# Patient Record
Sex: Female | Born: 1975 | Race: Black or African American | Hispanic: No | Smoking: Current some day smoker
Health system: Southern US, Community
[De-identification: ages and names within clinical notes are randomized; demographics above are authoritative.]

## PROBLEM LIST (undated history)

## (undated) ENCOUNTER — Emergency Department (HOSPITAL_COMMUNITY): Admission: EM | Payer: Medicaid Other | Source: Home / Self Care

## (undated) DIAGNOSIS — R569 Unspecified convulsions: Secondary | ICD-10-CM

## (undated) DIAGNOSIS — Z21 Asymptomatic human immunodeficiency virus [HIV] infection status: Secondary | ICD-10-CM

## (undated) DIAGNOSIS — F419 Anxiety disorder, unspecified: Secondary | ICD-10-CM

## (undated) DIAGNOSIS — G4730 Sleep apnea, unspecified: Secondary | ICD-10-CM

## (undated) DIAGNOSIS — F32A Depression, unspecified: Secondary | ICD-10-CM

## (undated) DIAGNOSIS — J189 Pneumonia, unspecified organism: Secondary | ICD-10-CM

## (undated) DIAGNOSIS — G8929 Other chronic pain: Secondary | ICD-10-CM

## (undated) DIAGNOSIS — R011 Cardiac murmur, unspecified: Secondary | ICD-10-CM

## (undated) DIAGNOSIS — K8020 Calculus of gallbladder without cholecystitis without obstruction: Secondary | ICD-10-CM

## (undated) DIAGNOSIS — B20 Human immunodeficiency virus [HIV] disease: Secondary | ICD-10-CM

## (undated) DIAGNOSIS — M5430 Sciatica, unspecified side: Secondary | ICD-10-CM

## (undated) DIAGNOSIS — J449 Chronic obstructive pulmonary disease, unspecified: Secondary | ICD-10-CM

## (undated) DIAGNOSIS — E119 Type 2 diabetes mellitus without complications: Secondary | ICD-10-CM

## (undated) DIAGNOSIS — M797 Fibromyalgia: Secondary | ICD-10-CM

## (undated) DIAGNOSIS — N809 Endometriosis, unspecified: Secondary | ICD-10-CM

## (undated) DIAGNOSIS — F17213 Nicotine dependence, cigarettes, with withdrawal: Secondary | ICD-10-CM

## (undated) DIAGNOSIS — M549 Dorsalgia, unspecified: Secondary | ICD-10-CM

## (undated) DIAGNOSIS — D329 Benign neoplasm of meninges, unspecified: Secondary | ICD-10-CM

## (undated) DIAGNOSIS — D649 Anemia, unspecified: Secondary | ICD-10-CM

## (undated) DIAGNOSIS — I10 Essential (primary) hypertension: Secondary | ICD-10-CM

## (undated) DIAGNOSIS — D332 Benign neoplasm of brain, unspecified: Secondary | ICD-10-CM

## (undated) DIAGNOSIS — G40409 Other generalized epilepsy and epileptic syndromes, not intractable, without status epilepticus: Secondary | ICD-10-CM

## (undated) DIAGNOSIS — I639 Cerebral infarction, unspecified: Secondary | ICD-10-CM

## (undated) DIAGNOSIS — J45909 Unspecified asthma, uncomplicated: Secondary | ICD-10-CM

## (undated) DIAGNOSIS — M1990 Unspecified osteoarthritis, unspecified site: Secondary | ICD-10-CM

## (undated) DIAGNOSIS — G43909 Migraine, unspecified, not intractable, without status migrainosus: Secondary | ICD-10-CM

## (undated) DIAGNOSIS — K219 Gastro-esophageal reflux disease without esophagitis: Secondary | ICD-10-CM

## (undated) DIAGNOSIS — J309 Allergic rhinitis, unspecified: Secondary | ICD-10-CM

## (undated) DIAGNOSIS — C539 Malignant neoplasm of cervix uteri, unspecified: Secondary | ICD-10-CM

## (undated) DIAGNOSIS — I82409 Acute embolism and thrombosis of unspecified deep veins of unspecified lower extremity: Secondary | ICD-10-CM

## (undated) HISTORY — PX: OTHER SURGICAL HISTORY: SHX169

## (undated) HISTORY — DX: Asymptomatic human immunodeficiency virus (hiv) infection status: Z21

## (undated) HISTORY — PX: CHOLECYSTECTOMY: SHX55

## (undated) HISTORY — PX: BRAIN SURGERY: SHX531

## (undated) HISTORY — DX: Benign neoplasm of brain, unspecified: D33.2

## (undated) HISTORY — DX: Human immunodeficiency virus (HIV) disease: B20

---

## 2000-04-15 HISTORY — PX: CHOLECYSTECTOMY: SHX55

## 2004-04-15 DIAGNOSIS — C539 Malignant neoplasm of cervix uteri, unspecified: Secondary | ICD-10-CM

## 2004-04-15 HISTORY — DX: Malignant neoplasm of cervix uteri, unspecified: C53.9

## 2012-09-24 ENCOUNTER — Emergency Department (HOSPITAL_COMMUNITY): Payer: Medicaid - Out of State

## 2012-09-24 ENCOUNTER — Inpatient Hospital Stay (HOSPITAL_COMMUNITY)
Admission: EM | Admit: 2012-09-24 | Discharge: 2012-09-25 | DRG: 101 | Discharge status: Against Medical Advice | Payer: Medicaid - Out of State | Attending: Internal Medicine | Admitting: Internal Medicine

## 2012-09-24 ENCOUNTER — Encounter (HOSPITAL_COMMUNITY): Payer: Self-pay

## 2012-09-24 DIAGNOSIS — G40901 Epilepsy, unspecified, not intractable, with status epilepticus: Secondary | ICD-10-CM

## 2012-09-24 DIAGNOSIS — G40409 Other generalized epilepsy and epileptic syndromes, not intractable, without status epilepticus: Secondary | ICD-10-CM | POA: Diagnosis present

## 2012-09-24 DIAGNOSIS — F17200 Nicotine dependence, unspecified, uncomplicated: Secondary | ICD-10-CM | POA: Diagnosis present

## 2012-09-24 DIAGNOSIS — Z9119 Patient's noncompliance with other medical treatment and regimen: Secondary | ICD-10-CM

## 2012-09-24 DIAGNOSIS — Z79899 Other long term (current) drug therapy: Secondary | ICD-10-CM

## 2012-09-24 DIAGNOSIS — D496 Neoplasm of unspecified behavior of brain: Secondary | ICD-10-CM

## 2012-09-24 DIAGNOSIS — I10 Essential (primary) hypertension: Secondary | ICD-10-CM | POA: Diagnosis present

## 2012-09-24 DIAGNOSIS — Z8541 Personal history of malignant neoplasm of cervix uteri: Secondary | ICD-10-CM

## 2012-09-24 DIAGNOSIS — G40401 Other generalized epilepsy and epileptic syndromes, not intractable, with status epilepticus: Principal | ICD-10-CM | POA: Diagnosis present

## 2012-09-24 DIAGNOSIS — Z91199 Patient's noncompliance with other medical treatment and regimen due to unspecified reason: Secondary | ICD-10-CM

## 2012-09-24 HISTORY — DX: Malignant neoplasm of cervix uteri, unspecified: C53.9

## 2012-09-24 HISTORY — DX: Unspecified convulsions: R56.9

## 2012-09-24 HISTORY — DX: Other generalized epilepsy and epileptic syndromes, not intractable, without status epilepticus: G40.409

## 2012-09-24 HISTORY — DX: Unspecified asthma, uncomplicated: J45.909

## 2012-09-24 LAB — URINALYSIS, ROUTINE W REFLEX MICROSCOPIC
Bilirubin Urine: NEGATIVE
Ketones, ur: 15 mg/dL — AB
Leukocytes, UA: NEGATIVE
Nitrite: NEGATIVE
Protein, ur: NEGATIVE mg/dL
pH: 7.5 (ref 5.0–8.0)

## 2012-09-24 LAB — CBC WITH DIFFERENTIAL/PLATELET
Hemoglobin: 12.8 g/dL (ref 12.0–15.0)
Lymphocytes Relative: 48 % — ABNORMAL HIGH (ref 12–46)
Lymphs Abs: 2.2 10*3/uL (ref 0.7–4.0)
MCH: 31.7 pg (ref 26.0–34.0)
Monocytes Relative: 11 % (ref 3–12)
Neutro Abs: 1.9 10*3/uL (ref 1.7–7.7)
Neutrophils Relative %: 40 % — ABNORMAL LOW (ref 43–77)
Platelets: 196 10*3/uL (ref 150–400)
RBC: 4.04 MIL/uL (ref 3.87–5.11)
WBC: 4.7 10*3/uL (ref 4.0–10.5)

## 2012-09-24 LAB — COMPREHENSIVE METABOLIC PANEL
ALT: 12 U/L (ref 0–35)
Alkaline Phosphatase: 43 U/L (ref 39–117)
BUN: 9 mg/dL (ref 6–23)
CO2: 30 mEq/L (ref 19–32)
Chloride: 98 mEq/L (ref 96–112)
GFR calc Af Amer: 83 mL/min — ABNORMAL LOW (ref >90)
Glucose, Bld: 85 mg/dL (ref 70–99)
Potassium: 3.6 mEq/L (ref 3.5–5.1)
Sodium: 135 mEq/L (ref 135–145)
Total Bilirubin: 0.4 mg/dL (ref 0.3–1.2)
Total Protein: 6.7 g/dL (ref 6.0–8.3)

## 2012-09-24 LAB — RAPID URINE DRUG SCREEN, HOSP PERFORMED
Amphetamines: NOT DETECTED
Barbiturates: NOT DETECTED
Cocaine: NOT DETECTED
Opiates: NOT DETECTED
Tetrahydrocannabinol: POSITIVE — AB

## 2012-09-24 LAB — SALICYLATE LEVEL: Salicylate Lvl: <2 mg/dL — ABNORMAL LOW (ref 2.8–20.0)

## 2012-09-24 MED ORDER — SODIUM CHLORIDE 0.9 % IV SOLN
INTRAVENOUS | Status: DC
  Administered 2012-09-24: 20:00:00 via INTRAVENOUS

## 2012-09-24 MED ORDER — LORAZEPAM 2 MG/ML IJ SOLN
2.0000 mg | Freq: Once | INTRAMUSCULAR | Status: AC
  Administered 2012-09-24: 2 mg via INTRAVENOUS

## 2012-09-24 MED ORDER — LORAZEPAM 2 MG/ML IJ SOLN
INTRAMUSCULAR | Status: AC
  Filled 2012-09-24: qty 1

## 2012-09-24 NOTE — ED Notes (Signed)
Patient was seen walking in the [facility] reporting she did not feel well; witnessed seizure by friend and bystandard.  Rapid response brought patient to ED, patient post ictal with urine present to pants, patient unresponsive at this time.

## 2012-09-24 NOTE — ED Provider Notes (Signed)
History     CSN: [id number]  Arrival date & time 09/24/12  2018   None     Chief Complaint  Patient presents with  . Seizures    (Consider location/radiation/quality/duration/timing/severity/associated sxs/prior treatment) Patient is a 37 y.o. female presenting with seizures. History provided by: Pt's boyfriend.  Seizures Seizure activity on arrival: Pt has a history of seizures, is on Depakote and Keppra.  She had had brain surgery a year ago in [place].  She was feeling poorly today, so came to hospital, collapsed and had 2 seizures in the atrium of the hospital entrance.   Seizure type:  Grand mal Preceding symptoms comment:  Feeling of weakness. Initial focality:  None Episode characteristics: generalized shaking   Postictal symptoms: confusion and somnolence   Severity:  Severe Duration: Unknown. Number of seizures this episode:  2 Progression:  Improving Recent head injury:  No recent head injuries PTA treatment:  None History of seizures: yes   Severity:  Severe Seizure control level: No recent seizures. Current therapy:  Carbamazepine and levetiracetam Compliance with medications: Unknown.   Past Medical History  Diagnosis Date  . Seizures     Past Surgical History  Procedure Laterality Date  . Brain surgery      No family history on file.  History  Substance Use Topics  . Smoking status: Not on file  . Smokeless tobacco: Not on file  . Alcohol Use: Not on file    OB History   Grav Para Term Preterm Abortions TAB SAB Ect Mult Living                  Review of Systems  Unable to perform ROS: Patient unresponsive  Neurological: Positive for seizures.    Allergies  Review of patient's allergies indicates not on file.  Home Medications  No current outpatient prescriptions on file.  BP 122/95  Pulse 107  Resp 25  SpO2 100%  Physical Exam  Nursing note and vitals reviewed. Constitutional:  Obese woman, somnolent, breathing well, no  airway issue.  HENT:  Head: Normocephalic and atraumatic.  Right Ear: External ear normal.  Left Ear: External ear normal.  Mouth/Throat: Oropharynx is clear and moist.  Eyes: Conjunctivae are normal. Pupils are equal, round, and reactive to light.  Bilateral conjunctival redness.  Neck: Normal range of motion. Neck supple.  Cardiovascular: Normal rate, regular rhythm and normal heart sounds.   Pulmonary/Chest: Effort normal and breath sounds normal.  Abdominal: Soft. Bowel sounds are normal.  Musculoskeletal:  No deformity of extremities.  Neurological:  Unresponsive, but appears to be waking up.  No response to touch, no spontaneous movements.  Skin: Skin is warm and dry.  Psychiatric:  Unable to assess.    ED Course  CRITICAL CARE Performed by: Osvaldo Human Authorized by: Osvaldo Human Total critical care time: 60 minutes Critical care was necessary to treat or prevent imminent or life-threatening deterioration of the following conditions: CNS failure or compromise (37 year old woman had 2 seizures in hospital atrium, seen STAT on arrival to ED.  She had 2 subsequent seizures requiring IV Ativan.  Had lab workup, neurologic consult and admission by hospitalists for status epilepticus.). Critical care was time spent personally by me on the following activities: discussions with consultants, evaluation of patient's response to treatment, examination of patient, obtaining history from patient or surrogate, ordering and review of laboratory studies, ordering and review of radiographic studies, re-evaluation of patient's condition and ordering and performing treatments  and interventions.   (including critical care time)  Labs Reviewed  CBC WITH DIFFERENTIAL  COMPREHENSIVE METABOLIC PANEL  URINALYSIS, ROUTINE W REFLEX MICROSCOPIC  PREGNANCY, URINE  ETHANOL  URINE RAPID DRUG SCREEN (HOSP PERFORMED)  ACETAMINOPHEN LEVEL  SALICYLATE LEVEL  VALPROIC ACID LEVEL   8:22  PM Pt seen --> physical exam performed.  History obtained from boyfriend.  Lab workup ordered.  IV fluids, nasal oxygen ordered.  Pt appears postictal, appears to be waking up.   10:23 PM Has had 2 more seizures treated by Dr. Bernette Mayers with IV ativan.  Resting peacefully.   Results for orders placed during the hospital encounter of 09/24/12  MRSA PCR SCREENING      Result Value Range   MRSA by PCR NEGATIVE  NEGATIVE  CBC WITH DIFFERENTIAL      Result Value Range   WBC 4.7  4.0 - 10.5 K/uL   RBC 4.04  3.87 - 5.11 MIL/uL   Hemoglobin 12.8  12.0 - 15.0 g/dL   HCT 16.1  09.6 - 04.5 %   MCV 93.3  78.0 - 100.0 fL   MCH 31.7  26.0 - 34.0 pg   MCHC 34.0  30.0 - 36.0 g/dL   RDW 40.9  81.1 - 91.4 %   Platelets 196  150 - 400 K/uL   Neutrophils Relative % 40 (*) 43 - 77 %   Neutro Abs 1.9  1.7 - 7.7 K/uL   Lymphocytes Relative 48 (*) 12 - 46 %   Lymphs Abs 2.2  0.7 - 4.0 K/uL   Monocytes Relative 11  3 - 12 %   Monocytes Absolute 0.5  0.1 - 1.0 K/uL   Eosinophils Relative 1  0 - 5 %   Eosinophils Absolute 0.0  0.0 - 0.7 K/uL   Basophils Relative 1  0 - 1 %   Basophils Absolute 0.0  0.0 - 0.1 K/uL  COMPREHENSIVE METABOLIC PANEL      Result Value Range   Sodium 135  135 - 145 mEq/L   Potassium 3.6  3.5 - 5.1 mEq/L   Chloride 98  96 - 112 mEq/L   CO2 30  19 - 32 mEq/L   Glucose, Bld 85  70 - 99 mg/dL   BUN 9  6 - 23 mg/dL   Creatinine, Ser 7.82  0.50 - 1.10 mg/dL   Calcium 9.1  8.4 - 95.6 mg/dL   Total Protein 6.7  6.0 - 8.3 g/dL   Albumin 3.6  3.5 - 5.2 g/dL   AST 14  0 - 37 U/L   ALT 12  0 - 35 U/L   Alkaline Phosphatase 43  39 - 117 U/L   Total Bilirubin 0.4  0.3 - 1.2 mg/dL   GFR calc non Af Amer 72 (*) >90 mL/min   GFR calc Af Amer 83 (*) >90 mL/min  URINALYSIS, ROUTINE W REFLEX MICROSCOPIC      Result Value Range   Color, Urine YELLOW  YELLOW   APPearance CLEAR  CLEAR   Specific Gravity, Urine 1.025  1.005 - 1.030   pH 7.5  5.0 - 8.0   Glucose, UA NEGATIVE  NEGATIVE  mg/dL   Hgb urine dipstick SMALL (*) NEGATIVE   Bilirubin Urine NEGATIVE  NEGATIVE   Ketones, ur 15 (*) NEGATIVE mg/dL   Protein, ur NEGATIVE  NEGATIVE mg/dL   Urobilinogen, UA 0.2  0.0 - 1.0 mg/dL   Nitrite NEGATIVE  NEGATIVE   Leukocytes, UA NEGATIVE  NEGATIVE  PREGNANCY, URINE      Result Value Range   Preg Test, Ur NEGATIVE  NEGATIVE  ETHANOL      Result Value Range   Alcohol, Ethyl (B) <11  0 - 11 mg/dL  URINE RAPID DRUG SCREEN (HOSP PERFORMED)      Result Value Range   Opiates NONE DETECTED  NONE DETECTED   Cocaine NONE DETECTED  NONE DETECTED   Benzodiazepines POSITIVE (*) NONE DETECTED   Amphetamines NONE DETECTED  NONE DETECTED   Tetrahydrocannabinol POSITIVE (*) NONE DETECTED   Barbiturates NONE DETECTED  NONE DETECTED  ACETAMINOPHEN LEVEL      Result Value Range   Acetaminophen (Tylenol), Serum <15.0  10 - 30 ug/mL  SALICYLATE LEVEL      Result Value Range   Salicylate Lvl <2.0 (*) 2.8 - 20.0 mg/dL  VALPROIC ACID LEVEL      Result Value Range   Valproic Acid Lvl 39.2 (*) 50.0 - 100.0 ug/mL  URINE MICROSCOPIC-ADD ON      Result Value Range   Squamous Epithelial / LPF RARE  RARE   RBC / HPF 0-2  <3 RBC/hpf   Bacteria, UA RARE  RARE  TSH      Result Value Range   TSH 1.422  0.350 - 4.500 uIU/mL  COMPREHENSIVE METABOLIC PANEL      Result Value Range   Sodium 136  135 - 145 mEq/L   Potassium 3.6  3.5 - 5.1 mEq/L   Chloride 102  96 - 112 mEq/L   CO2 25  19 - 32 mEq/L   Glucose, Bld 101 (*) 70 - 99 mg/dL   BUN 9  6 - 23 mg/dL   Creatinine, Ser 2.95  0.50 - 1.10 mg/dL   Calcium 9.0  8.4 - 62.1 mg/dL   Total Protein 6.4  6.0 - 8.3 g/dL   Albumin 3.5  3.5 - 5.2 g/dL   AST 12  0 - 37 U/L   ALT 12  0 - 35 U/L   Alkaline Phosphatase 46  39 - 117 U/L   Total Bilirubin 0.4  0.3 - 1.2 mg/dL   GFR calc non Af Amer >90  >90 mL/min   GFR calc Af Amer >90  >90 mL/min  CBC      Result Value Range   WBC 6.0  4.0 - 10.5 K/uL   RBC 3.96  3.87 - 5.11 MIL/uL   Hemoglobin  12.5  12.0 - 15.0 g/dL   HCT 30.8  65.7 - 84.6 %   MCV 92.4  78.0 - 100.0 fL   MCH 31.6  26.0 - 34.0 pg   MCHC 34.2  30.0 - 36.0 g/dL   RDW 96.2  95.2 - 84.1 %   Platelets 180  150 - 400 K/uL  MAGNESIUM      Result Value Range   Magnesium 1.7  1.5 - 2.5 mg/dL  PHOSPHORUS      Result Value Range   Phosphorus 4.0  2.3 - 4.6 mg/dL  POCT I-STAT TROPONIN I      Result Value Range   Troponin i, poc 0.01  0.00 - 0.08 ng/mL   Comment 3            Ct Head Wo Contrast  09/24/2012   *RADIOLOGY REPORT*  Clinical Data: Two seizures.  History of brain surgery 1 year ago. Patient is currently unresponsive.  CT HEAD WITHOUT CONTRAST  Technique:  Contiguous axial images were obtained from the base of the  skull through the vertex without contrast.  Comparison: None.  Findings: Study is somewhat technically limited due to streak artifact.  Postoperative changes in the left frontal temporal region with craniotomy and plate screw fixation.  No depressed skull fractures are visualized.  Prominent extra-axial calcifications are seen along the falx and tentorium.  These calcifications may be normal variation, due to dystrophic changes, or previous infection.  There is no mass effect or midline shift. No abnormal extra-axial fluid collections.  Gray-white matter junctions are distinct.  Basal cisterns are not effaced. Suggestion of mild septal malacia in the left anterior temporal region which may represent postoperative change or artifact.  No definite evidence of any mass lesion.  No acute intracranial hemorrhage.  No ventricular dilatation.  Visualized paranasal sinuses and mastoid air cells are not opacified.  IMPRESSION: Postoperative changes on the left.  No acute intracranial abnormalities are suggested.  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   Original Report Authenticated By: Burman Nieves, M.D.   Dg Chest Port 1 View  09/24/2012   *RADIOLOGY REPORT*  Clinical Data:  Seizure  PORTABLE CHEST - 1 VIEW  Comparison: None.  Findings: Mild hypoventilation with mild bibasilar atelectasis.  No definite pneumonia.  No heart failure or effusion.  IMPRESSION: Hypoventilation with mild atelectasis in the lung bases.   Original Report Authenticated By: Janeece Riggers, M.D.    Lab workup showed therapeutic Depakote level.  Dr. Bernette Mayers contacted Dr. Thana Farr, neurologist, who will consult on her, and Dr. Lana Fish, [facility] hospitalist, who will admit her.    1. Status epilepticus   2. Brain tumor   3. Seizure disorder, grand mal           Carleene Cooper III, MD 09/25/12 1213

## 2012-09-24 NOTE — ED Provider Notes (Signed)
Care assumed from Dr. Ignacia Palma. Pt had another seizure after initial dose of Ativan. Given a second dose and continue to observe. Boyfriend at bedside states she has been taking her medications as directed.    Date: 09/24/2012  Rate: 101  Rhythm: sinus tachycardia  QRS Axis: normal  Intervals: normal  ST/T Wave abnormalities: normal  Conduction Disutrbances:none  Narrative Interpretation:   Old EKG Reviewed: none available  Ct Head Wo Contrast  09/24/2012   *RADIOLOGY REPORT*  Clinical Data: Two seizures.  History of brain surgery 1 year ago. Patient is currently unresponsive.  CT HEAD WITHOUT CONTRAST  Technique:  Contiguous axial images were obtained from the base of the skull through the vertex without contrast.  Comparison: None.  Findings: Study is somewhat technically limited due to streak artifact.  Postoperative changes in the left frontal temporal region with craniotomy and plate screw fixation.  No depressed skull fractures are visualized.  Prominent extra-axial calcifications are seen along the falx and tentorium.  These calcifications may be normal variation, due to dystrophic changes, or previous infection.  There is no mass effect or midline shift. No abnormal extra-axial fluid collections.  Gray-white matter junctions are distinct.  Basal cisterns are not effaced. Suggestion of mild septal malacia in the left anterior temporal region which may represent postoperative change or artifact.  No definite evidence of any mass lesion.  No acute intracranial hemorrhage.  No ventricular dilatation.  Visualized paranasal sinuses and mastoid air cells are not opacified.  IMPRESSION: Postoperative changes on the left.  No acute intracranial abnormalities are suggested.  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   Original Report Authenticated By: Burman Nieves, M.D.   Dg Chest Port 1 View  09/24/2012   *RADIOLOGY REPORT*  Clinical Data: Seizure  PORTABLE CHEST -  1 VIEW  Comparison: None.  Findings: Mild hypoventilation with mild bibasilar atelectasis.  No definite pneumonia.  No heart failure or effusion.  IMPRESSION: Hypoventilation with mild atelectasis in the lung bases.   Original Report Authenticated By: Janeece Riggers, M.D.   Labs reviewed, discussed with Dr. Thad Ranger on call for Neurology and Dr. Adela Glimpse on call for Hospitalist who will admit for status epilepticus.   Leyton Brownlee B. Bernette Mayers, MD 09/24/12 2342

## 2012-09-25 ENCOUNTER — Encounter (HOSPITAL_COMMUNITY): Payer: Self-pay | Admitting: Internal Medicine

## 2012-09-25 DIAGNOSIS — G40409 Other generalized epilepsy and epileptic syndromes, not intractable, without status epilepticus: Secondary | ICD-10-CM | POA: Diagnosis present

## 2012-09-25 DIAGNOSIS — D496 Neoplasm of unspecified behavior of brain: Secondary | ICD-10-CM

## 2012-09-25 DIAGNOSIS — G40401 Other generalized epilepsy and epileptic syndromes, not intractable, with status epilepticus: Principal | ICD-10-CM

## 2012-09-25 DIAGNOSIS — G40309 Generalized idiopathic epilepsy and epileptic syndromes, not intractable, without status epilepticus: Secondary | ICD-10-CM

## 2012-09-25 LAB — PHOSPHORUS: Phosphorus: 4 mg/dL (ref 2.3–4.6)

## 2012-09-25 LAB — COMPREHENSIVE METABOLIC PANEL
BUN: 9 mg/dL (ref 6–23)
Calcium: 9 mg/dL (ref 8.4–10.5)
GFR calc Af Amer: >90 mL/min (ref >90)
Glucose, Bld: 101 mg/dL — ABNORMAL HIGH (ref 70–99)
Total Protein: 6.4 g/dL (ref 6.0–8.3)

## 2012-09-25 LAB — CBC
HCT: 36.6 % (ref 36.0–46.0)
Hemoglobin: 12.5 g/dL (ref 12.0–15.0)
WBC: 6 10*3/uL (ref 4.0–10.5)

## 2012-09-25 LAB — MRSA PCR SCREENING: MRSA by PCR: NEGATIVE

## 2012-09-25 MED ORDER — ONDANSETRON HCL 4 MG PO TABS: 4.0000 mg | ORAL_TABLET | Freq: Four times a day (QID) | ORAL | Status: DC | PRN

## 2012-09-25 MED ORDER — SODIUM CHLORIDE 0.9 % IJ SOLN
3.0000 mL | Freq: Two times a day (BID) | INTRAMUSCULAR | Status: DC
  Administered 2012-09-25: 3 mL via INTRAVENOUS

## 2012-09-25 MED ORDER — FOLIC ACID 1 MG PO TABS
1.0000 mg | ORAL_TABLET | Freq: Every day | ORAL | Status: DC
  Administered 2012-09-25: 1 mg via ORAL
  Filled 2012-09-25: qty 1

## 2012-09-25 MED ORDER — FLUTICASONE PROPIONATE 50 MCG/ACT NA SUSP
1.0000 | Freq: Two times a day (BID) | NASAL | Status: DC
  Administered 2012-09-25: 1 via NASAL
  Filled 2012-09-25: qty 16

## 2012-09-25 MED ORDER — DIAZEPAM 5 MG PO TABS
10.0000 mg | ORAL_TABLET | Freq: Two times a day (BID) | ORAL | Status: DC | PRN
  Administered 2012-09-25: 10 mg via ORAL
  Filled 2012-09-25: qty 2

## 2012-09-25 MED ORDER — VALPROATE SODIUM 500 MG/5ML IV SOLN
500.0000 mg | Freq: Three times a day (TID) | INTRAVENOUS | Status: DC
  Filled 2012-09-25 (×2): qty 5

## 2012-09-25 MED ORDER — ALBUTEROL SULFATE HFA 108 (90 BASE) MCG/ACT IN AERS: 2.0000 | INHALATION_SPRAY | Freq: Four times a day (QID) | RESPIRATORY_TRACT | Status: DC | PRN

## 2012-09-25 MED ORDER — PROPRANOLOL HCL 10 MG PO TABS
10.0000 mg | ORAL_TABLET | Freq: Two times a day (BID) | ORAL | Status: DC
  Administered 2012-09-25 (×2): 10 mg via ORAL
  Filled 2012-09-25 (×3): qty 1

## 2012-09-25 MED ORDER — GABAPENTIN 400 MG PO CAPS
800.0000 mg | ORAL_CAPSULE | Freq: Two times a day (BID) | ORAL | Status: DC
  Administered 2012-09-25 (×2): 800 mg via ORAL
  Filled 2012-09-25 (×3): qty 2

## 2012-09-25 MED ORDER — NICOTINE 7 MG/24HR TD PT24
7.0000 mg | MEDICATED_PATCH | Freq: Every day | TRANSDERMAL | Status: DC | PRN
  Filled 2012-09-25: qty 1

## 2012-09-25 MED ORDER — DOCUSATE SODIUM 100 MG PO CAPS
100.0000 mg | ORAL_CAPSULE | Freq: Two times a day (BID) | ORAL | Status: DC | PRN
  Filled 2012-09-25: qty 1

## 2012-09-25 MED ORDER — ACETAMINOPHEN 325 MG PO TABS: 650.0000 mg | ORAL_TABLET | Freq: Four times a day (QID) | ORAL | Status: DC | PRN

## 2012-09-25 MED ORDER — FLUTICASONE PROPIONATE HFA 44 MCG/ACT IN AERO
1.0000 | INHALATION_SPRAY | Freq: Two times a day (BID) | RESPIRATORY_TRACT | Status: DC
  Administered 2012-09-25: 1 via RESPIRATORY_TRACT
  Filled 2012-09-25: qty 10.6

## 2012-09-25 MED ORDER — SODIUM CHLORIDE 0.9 % IV SOLN
1000.0000 mg | Freq: Three times a day (TID) | INTRAVENOUS | Status: DC
  Administered 2012-09-25 (×2): 1000 mg via INTRAVENOUS
  Filled 2012-09-25 (×3): qty 10

## 2012-09-25 MED ORDER — VALPROIC ACID 250 MG PO CAPS
500.0000 mg | ORAL_CAPSULE | Freq: Three times a day (TID) | ORAL | Status: DC
  Filled 2012-09-25 (×3): qty 2

## 2012-09-25 MED ORDER — LEVETIRACETAM 500 MG PO TABS
1000.0000 mg | ORAL_TABLET | Freq: Three times a day (TID) | ORAL | Status: DC
  Filled 2012-09-25 (×3): qty 2

## 2012-09-25 MED ORDER — OXYCODONE HCL 5 MG PO TABS
5.0000 mg | ORAL_TABLET | Freq: Two times a day (BID) | ORAL | Status: DC | PRN
  Administered 2012-09-25: 5 mg via ORAL
  Filled 2012-09-25: qty 1

## 2012-09-25 MED ORDER — DOCUSATE SODIUM 100 MG PO CAPS
100.0000 mg | ORAL_CAPSULE | Freq: Two times a day (BID) | ORAL | Status: DC
  Administered 2012-09-25: 100 mg via ORAL
  Filled 2012-09-25 (×2): qty 1

## 2012-09-25 MED ORDER — PANTOPRAZOLE SODIUM 40 MG PO TBEC
40.0000 mg | DELAYED_RELEASE_TABLET | Freq: Every day | ORAL | Status: DC
  Administered 2012-09-25: 40 mg via ORAL
  Filled 2012-09-25: qty 1

## 2012-09-25 MED ORDER — QUETIAPINE FUMARATE 300 MG PO TABS
300.0000 mg | ORAL_TABLET | Freq: Every day | ORAL | Status: DC
  Filled 2012-09-25 (×2): qty 1

## 2012-09-25 MED ORDER — ACETAMINOPHEN 650 MG RE SUPP: 650.0000 mg | Freq: Four times a day (QID) | RECTAL | Status: DC | PRN

## 2012-09-25 MED ORDER — VALPROATE SODIUM 500 MG/5ML IV SOLN
1000.0000 mg | Freq: Once | INTRAVENOUS | Status: AC
  Administered 2012-09-25: 1000 mg via INTRAVENOUS
  Filled 2012-09-25: qty 10

## 2012-09-25 MED ORDER — HYDROCODONE-ACETAMINOPHEN 5-325 MG PO TABS
1.0000 | ORAL_TABLET | ORAL | Status: DC | PRN
  Administered 2012-09-25 (×2): 2 via ORAL
  Filled 2012-09-25 (×2): qty 2

## 2012-09-25 MED ORDER — ONDANSETRON HCL 4 MG/2ML IJ SOLN
4.0000 mg | Freq: Four times a day (QID) | INTRAMUSCULAR | Status: DC | PRN
  Administered 2012-09-25: 4 mg via INTRAVENOUS
  Filled 2012-09-25: qty 2

## 2012-09-25 NOTE — Consult Note (Signed)
Reason for Consult:Seizures Referring Physician: Bernette Mayers  CC: Seizures  HPI: Lori Ford is a RH 37 y.o. female with a history of seizures s/p brain tumor removal a little over a year ago.  Patient has had multiple seizures today and is currently lethargic.  History given by boyfriend.  Today patient reported not feeling well. Initially she became dizzy then later complained that she felt she was going to pass out.  On the way to the ED while in the lobby was noted to have 2 seizures.  Patient was brought to the ED where she had two more seizures.  Patient has received 4mg  of Ativan and has not regained baseline mental status.   Per report of her boyfriend the patient is taking her medications.  When bottles are reviewed though it does not seem that the patient is taking the Depakote and Neurontin regularly.  She does seem to be taking the Keppra as prescribed.  Patient has not had a seizure in about 4-6 weeks.   After brain tumor removal patient was weak on the left and wheelchair bound but has recovered nicely.    Past Medical History  Diagnosis Date  . Seizures   . Cervical cancer   . Seizure disorder, grand mal     Past Surgical History  Procedure Laterality Date  . Brain surgery      Family History  Problem Relation Age of Onset  . Family history unknown: Yes  Patient too lethargic to provide and boyfriend unaware.    Social History:  reports that she has been smoking.  She does not have any smokeless tobacco history on file. She reports that she does not drink alcohol or use illicit drugs.  Allergies  Allergen Reactions  . Other Swelling    "GRAPES"    Medications: I have reviewed the patient's current medications. Prior to Admission:  Current outpatient prescriptions: albuterol (PROVENTIL HFA;VENTOLIN HFA) 108 (90 BASE) MCG/ACT inhaler, Inhale 2 puffs into the lungs every 6 (six) hours as needed for wheezing., Disp: , Rfl: ;   beclomethasone (QVAR) 40 MCG/ACT inhaler,  Inhale 1 puff into the lungs 2 (two) times daily., Disp: , Rfl: ;   diazepam (VALIUM) 5 MG tablet, Take 10 mg by mouth 2 (two) times daily as needed for anxiety., Disp: , Rfl:  diphenhydrAMINE (SOMINEX) 25 MG tablet, Take 50 mg by mouth every 6 (six) hours as needed for sleep., Disp: , Rfl: ;   divalproex (DEPAKOTE) 500 MG DR tablet, Take 500 mg by mouth 3 (three) times daily., Disp: , Rfl: ;  fexofenadine (ALLEGRA) 60 MG tablet, Take 60 mg by mouth daily., Disp: , Rfl: ;   fluticasone (FLONASE) 50 MCG/ACT nasal spray, Place 1 spray into the nose 2 (two) times daily., Disp: , Rfl:  folic acid (FOLVITE) 1 MG tablet, Take 1 mg by mouth daily., Disp: , Rfl: ;   gabapentin (NEURONTIN) 800 MG tablet, Take 800 mg by mouth 2 (two) times daily., Disp: , Rfl: ;   hydrochlorothiazide (HYDRODIURIL) 25 MG tablet, Take 25 mg by mouth daily., Disp: , Rfl: ;   levETIRAcetam (KEPPRA) 1000 MG tablet, Take 1,000 mg by mouth 3 (three) times daily., Disp: , Rfl:  loratadine-pseudoephedrine (CLARITIN-D 24-HOUR) 10-240 MG per 24 hr tablet, Take 1 tablet by mouth daily., Disp: , Rfl: ;   omeprazole (PRILOSEC) 40 MG capsule, Take 40 mg by mouth daily., Disp: , Rfl: ;   oxycodone (OXY-IR) 5 MG capsule, Take 5 mg by mouth 2 (  two) times daily as needed for pain., Disp: , Rfl: ;   promethazine (PHENERGAN) 25 MG tablet, Take 25 mg by mouth 2 (two) times daily as needed for nausea., Disp: , Rfl:  propranolol (INDERAL) 10 MG tablet, Take 10 mg by mouth 2 (two) times daily., Disp: , Rfl: ;   QUEtiapine (SEROQUEL) 300 MG tablet, Take 300 mg by mouth daily., Disp: , Rfl: ;   sodium chloride (OCEAN) 0.65 % nasal spray, Place 1 spray into the nose as needed for congestion., Disp: , Rfl:   ROS: History obtained from boyfriend  General ROS: negative for - chills, fatigue, fever, night sweats, weight gain or weight loss Psychological ROS: negative for - behavioral disorder, hallucinations, memory difficulties, mood swings or suicidal  ideation Ophthalmic ROS: negative for - blurry vision, double vision, eye pain or loss of vision ENT ROS: negative for - epistaxis, nasal discharge, oral lesions, sore throat, tinnitus or vertigo Allergy and Immunology ROS: negative for - hives or itchy/watery eyes Hematological and Lymphatic ROS: negative for - bleeding problems, bruising or swollen lymph nodes Endocrine ROS: negative for - galactorrhea, hair pattern changes, polydipsia/polyuria or temperature intolerance Respiratory ROS: negative for - cough, hemoptysis, shortness of breath or wheezing Cardiovascular ROS: negative for - chest pain, dyspnea on exertion, edema or irregular heartbeat Gastrointestinal ROS: nausea after eating Genito-Urinary ROS: negative for - dysuria, hematuria, incontinence or urinary frequency/urgency Musculoskeletal ROS: negative for - joint swelling or muscular weakness Neurological ROS: as noted in HPI Dermatological ROS: negative for rash and skin lesion changes  Physical Examination: Blood pressure 105/69, pulse 86, temperature 98.7 F (37.1 C), temperature source Oral, resp. rate 20, last menstrual period 09/16/2012, SpO2 100.00%.  Neurologic Examination Mental Status: Lethargic.  Oriented to name only.  Speech fluent without evidence of aphasia although minimal.  Able to follow 3 step commands without difficulty. Cranial Nerves: II: Discs flat bilaterally; Visual fields grossly normal, pupils equal, round, reactive to light and accommodation III,IV, VI: ptosis not present, extra-ocular motions intact bilaterally V,VII: smile symmetric, facial light touch sensation normal bilaterally VIII: hearing normal bilaterally IX,X: gag reflex present XI: bilateral shoulder shrug XII: midline tongue extension Motor: Right : Upper extremity   5/5    Left:     Upper extremity   4+/5  Lower extremity   5/5     Lower extremity   5/5 Tone and bulk:normal tone throughout; no atrophy noted Sensory: Pinprick and  light touch intact throughout, bilaterally Deep Tendon Reflexes: 2+ in the upper extremities, 1+ at the knees and absent at the ankles Plantars: Right: mute   Left: mute Cerebellar: normal finger-to-nose and normal heel-to-shin test Gait: Unable to test CV: pulses palpable throughout   Laboratory Studies:   Basic Metabolic Panel:  Recent Labs Lab 09/24/12 2147  NA 135  K 3.6  CL 98  CO2 30  GLUCOSE 85  BUN 9  CREATININE 1.01  CALCIUM 9.1    Liver Function Tests:  Recent Labs Lab 09/24/12 2147  AST 14  ALT 12  ALKPHOS 43  BILITOT 0.4  PROT 6.7  ALBUMIN 3.6   No results found for this basename: LIPASE, AMYLASE,  in the last 168 hours No results found for this basename: AMMONIA,  in the last 168 hours  CBC:  Recent Labs Lab 09/24/12 2147  WBC 4.7  NEUTROABS 1.9  HGB 12.8  HCT 37.7  MCV 93.3  PLT 196    Cardiac Enzymes: No results found for this basename: CKTOTAL,  CKMB, CKMBINDEX, TROPONINI,  in the last 168 hours  BNP: No components found with this basename: POCBNP,   CBG: No results found for this basename: GLUCAP,  in the last 168 hours  Microbiology: No results found for this or any previous visit.  Coagulation Studies: No results found for this basename: LABPROT, INR,  in the last 72 hours  Urinalysis:  Recent Labs Lab 09/24/12 2305  COLORURINE YELLOW  LABSPEC 1.025  PHURINE 7.5  GLUCOSEU NEGATIVE  HGBUR SMALL*  BILIRUBINUR NEGATIVE  KETONESUR 15*  PROTEINUR NEGATIVE  UROBILINOGEN 0.2  NITRITE NEGATIVE  LEUKOCYTESUR NEGATIVE    Lipid Panel:  No results found for this basename: chol, trig, hdl, cholhdl, vldl, ldlcalc    HgbA1C:  No results found for this basename: HGBA1C    Urine Drug Screen:     Component Value Date/Time   LABOPIA NONE DETECTED 09/24/2012 2305   COCAINSCRNUR NONE DETECTED 09/24/2012 2305   LABBENZ POSITIVE* 09/24/2012 2305   AMPHETMU NONE DETECTED 09/24/2012 2305   THCU POSITIVE* 09/24/2012 2305   LABBARB  NONE DETECTED 09/24/2012 2305    Alcohol Level:  Recent Labs Lab 09/24/12 2147  ETH <11    Imaging: Ct Head Wo Contrast  09/24/2012   *RADIOLOGY REPORT*  Clinical Data: Two seizures.  History of brain surgery 1 year ago. Patient is currently unresponsive.  CT HEAD WITHOUT CONTRAST  Technique:  Contiguous axial images were obtained from the base of the skull through the vertex without contrast.  Comparison: None.  Findings: Study is somewhat technically limited due to streak artifact.  Postoperative changes in the left frontal temporal region with craniotomy and plate screw fixation.  No depressed skull fractures are visualized.  Prominent extra-axial calcifications are seen along the falx and tentorium.  These calcifications may be normal variation, due to dystrophic changes, or previous infection.  There is no mass effect or midline shift. No abnormal extra-axial fluid collections.  Gray-white matter junctions are distinct.  Basal cisterns are not effaced. Suggestion of mild septal malacia in the left anterior temporal region which may represent postoperative change or artifact.  No definite evidence of any mass lesion.  No acute intracranial hemorrhage.  No ventricular dilatation.  Visualized paranasal sinuses and mastoid air cells are not opacified.  IMPRESSION: Postoperative changes on the left.  No acute intracranial abnormalities are suggested.  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   Original Report Authenticated By: Burman Nieves, M.D.   Dg Chest Port 1 View  09/24/2012   *RADIOLOGY REPORT*  Clinical Data: Seizure  PORTABLE CHEST - 1 VIEW  Comparison: None.  Findings: Mild hypoventilation with mild bibasilar atelectasis.  No definite pneumonia.  No heart failure or effusion.  IMPRESSION: Hypoventilation with mild atelectasis in the lung bases.   Original Report Authenticated By: Janeece Riggers, M.D.     Assessment/Plan: 37 year old female with a history  of seizures since removal of a brain tumor over a year ago.  Has had 4 witnessed seizures today without return of baseline mental status.  From bottle check patient is not fully compliant with her medications.  Depakote level 39.2.  Unclear why at this time.  Patient unable to participate fully in providing history.  Head CT performed and reviewed.  CT shows post-op changes and left temporal encephalomalacia.  This is not consisent with the patient's left sided weakness currently noted on examination.    Recommendations: 1.  Keppra 1000mg  IV now with maintenance to be  continued at 1000mg  q8 hours.  Would change to po dosing once patient safe to take po. 2.  Depakote 1000mg  IV now with maintenance to be started at 500mg  q8 hours thereafter.  Level in the morning.  Once again dosing can be changes to po once safe to take po 3.  Seizure precautions 4.  If patient has not regained baseline mental status by morning would order EEG.  If at baseline mental status EEG unnecessary 5.  Ativan prn 6.  Restart Neurontin po once patient safe to take po 7.  MRI of the brain with and without contrast 8.  Serum Magnesium, phosphorus  Case discussed with Dr. Bobbye Riggs, MD [facility] [phone] 09/25/2012, 1:09 AM

## 2012-09-25 NOTE — Discharge Summary (Signed)
LEFT AMA SUMMARY  Sukanya Goldblatt  MR#: [id number]  DOB:1975/07/10  Date of Admission: 09/24/2012 Date of LEFT AMA: 09/25/2012  Attending Physician:Fritz Cauthon T  Patient's PCP:No primary provider on file.  Consults:  Neurohosp, MD  Disposition: LEFT AMA   Discharge Diagnoses: Present on Admission:  . Seizure disorder, grand mal  Initial presentation: 37yo female who presented to the ER c/o not feeling well all day. She came to ER by bus but before she even got into ED she had a seizure episode. She had subsequent episodes in the ED. Per ER MD patient was noted to have deviated gaze to the right with tonic clonic activity. Patient was post-ictal afterwards. She was given ativan and became seizure free but lethargic. Per her boyfriend they are from [place] and are here visiting. She has hx of brain tumor that was removed about 1 year ago and since then she has had seizures. Per boyfriend she is taking her medications as prescribed but Neurologist noted that her medication bottles are more full than expected suggesting some non compliance. Patient reported last seizure episode 3-4 months ago. Hospitalist called for admission with Neurology to consult.   Hospital Course:  LEFT AMA prior to completion of medical tx/diagnostic evaluation.  Multiple staff memebers, including the MD, attempted to convince pt to remain in hospital for tx of her potentially life threatening seizure d/o.  Seizure disorder  sub therapeutic valproic acid level - meds being adjusted by Neuro team   HTN   Tobacco abuse     Medication List  LEFT AMA    Day of Discharge BP 108/62  Pulse 73  Temp(Src) 99.1 F (37.3 C) (Oral)  Resp 20  Ht 5\' 5"  (1.651 m)  Wt 97.5 kg (214 lb 15.2 oz)  BMI 35.77 kg/m2  SpO2 98%  LMP 09/16/2012   Results for orders placed during the hospital encounter of 09/24/12 (from the past 24 hour(s))  CBC WITH DIFFERENTIAL     Status: Abnormal   Collection Time    09/24/12   9:47 PM      Result Value Range   WBC 4.7  4.0 - 10.5 K/uL   RBC 4.04  3.87 - 5.11 MIL/uL   Hemoglobin 12.8  12.0 - 15.0 g/dL   HCT 40.9  81.1 - 91.4 %   MCV 93.3  78.0 - 100.0 fL   MCH 31.7  26.0 - 34.0 pg   MCHC 34.0  30.0 - 36.0 g/dL   RDW 78.2  95.6 - 21.3 %   Platelets 196  150 - 400 K/uL   Neutrophils Relative % 40 (*) 43 - 77 %   Neutro Abs 1.9  1.7 - 7.7 K/uL   Lymphocytes Relative 48 (*) 12 - 46 %   Lymphs Abs 2.2  0.7 - 4.0 K/uL   Monocytes Relative 11  3 - 12 %   Monocytes Absolute 0.5  0.1 - 1.0 K/uL   Eosinophils Relative 1  0 - 5 %   Eosinophils Absolute 0.0  0.0 - 0.7 K/uL   Basophils Relative 1  0 - 1 %   Basophils Absolute 0.0  0.0 - 0.1 K/uL  COMPREHENSIVE METABOLIC PANEL     Status: Abnormal   Collection Time    09/24/12  9:47 PM      Result Value Range   Sodium 135  135 - 145 mEq/L   Potassium 3.6  3.5 - 5.1 mEq/L   Chloride 98  96 - 112 mEq/L  CO2 30  19 - 32 mEq/L   Glucose, Bld 85  70 - 99 mg/dL   BUN 9  6 - 23 mg/dL   Creatinine, Ser 8.65  0.50 - 1.10 mg/dL   Calcium 9.1  8.4 - 78.4 mg/dL   Total Protein 6.7  6.0 - 8.3 g/dL   Albumin 3.6  3.5 - 5.2 g/dL   AST 14  0 - 37 U/L   ALT 12  0 - 35 U/L   Alkaline Phosphatase 43  39 - 117 U/L   Total Bilirubin 0.4  0.3 - 1.2 mg/dL   GFR calc non Af Amer 72 (*) >90 mL/min   GFR calc Af Amer 83 (*) >90 mL/min  ETHANOL     Status: None   Collection Time    09/24/12  9:47 PM      Result Value Range   Alcohol, Ethyl (B) <11  0 - 11 mg/dL  ACETAMINOPHEN LEVEL     Status: None   Collection Time    09/24/12  9:47 PM      Result Value Range   Acetaminophen (Tylenol), Serum <15.0  10 - 30 ug/mL  SALICYLATE LEVEL     Status: Abnormal   Collection Time    09/24/12  9:47 PM      Result Value Range   Salicylate Lvl <2.0 (*) 2.8 - 20.0 mg/dL  VALPROIC ACID LEVEL     Status: Abnormal   Collection Time    09/24/12  9:47 PM      Result Value Range   Valproic Acid Lvl 39.2 (*) 50.0 - 100.0 ug/mL  POCT I-STAT  TROPONIN I     Status: None   Collection Time    09/24/12 10:01 PM      Result Value Range   Troponin i, poc 0.01  0.00 - 0.08 ng/mL   Comment 3           URINALYSIS, ROUTINE W REFLEX MICROSCOPIC     Status: Abnormal   Collection Time    09/24/12 11:05 PM      Result Value Range   Color, Urine YELLOW  YELLOW   APPearance CLEAR  CLEAR   Specific Gravity, Urine 1.025  1.005 - 1.030   pH 7.5  5.0 - 8.0   Glucose, UA NEGATIVE  NEGATIVE mg/dL   Hgb urine dipstick SMALL (*) NEGATIVE   Bilirubin Urine NEGATIVE  NEGATIVE   Ketones, ur 15 (*) NEGATIVE mg/dL   Protein, ur NEGATIVE  NEGATIVE mg/dL   Urobilinogen, UA 0.2  0.0 - 1.0 mg/dL   Nitrite NEGATIVE  NEGATIVE   Leukocytes, UA NEGATIVE  NEGATIVE  PREGNANCY, URINE     Status: None   Collection Time    09/24/12 11:05 PM      Result Value Range   Preg Test, Ur NEGATIVE  NEGATIVE  URINE RAPID DRUG SCREEN (HOSP PERFORMED)     Status: Abnormal   Collection Time    09/24/12 11:05 PM      Result Value Range   Opiates NONE DETECTED  NONE DETECTED   Cocaine NONE DETECTED  NONE DETECTED   Benzodiazepines POSITIVE (*) NONE DETECTED   Amphetamines NONE DETECTED  NONE DETECTED   Tetrahydrocannabinol POSITIVE (*) NONE DETECTED   Barbiturates NONE DETECTED  NONE DETECTED  URINE MICROSCOPIC-ADD ON     Status: None   Collection Time    09/24/12 11:05 PM      Result Value Range   Squamous Epithelial /  LPF RARE  RARE   RBC / HPF 0-2  <3 RBC/hpf   Bacteria, UA RARE  RARE  MRSA PCR SCREENING     Status: None   Collection Time    09/25/12  1:36 AM      Result Value Range   MRSA by PCR NEGATIVE  NEGATIVE  TSH     Status: None   Collection Time    09/25/12  3:35 AM      Result Value Range   TSH 1.422  0.350 - 4.500 uIU/mL  COMPREHENSIVE METABOLIC PANEL     Status: Abnormal   Collection Time    09/25/12  3:35 AM      Result Value Range   Sodium 136  135 - 145 mEq/L   Potassium 3.6  3.5 - 5.1 mEq/L   Chloride 102  96 - 112 mEq/L   CO2 25   19 - 32 mEq/L   Glucose, Bld 101 (*) 70 - 99 mg/dL   BUN 9  6 - 23 mg/dL   Creatinine, Ser 1.61  0.50 - 1.10 mg/dL   Calcium 9.0  8.4 - 09.6 mg/dL   Total Protein 6.4  6.0 - 8.3 g/dL   Albumin 3.5  3.5 - 5.2 g/dL   AST 12  0 - 37 U/L   ALT 12  0 - 35 U/L   Alkaline Phosphatase 46  39 - 117 U/L   Total Bilirubin 0.4  0.3 - 1.2 mg/dL   GFR calc non Af Amer >90  >90 mL/min   GFR calc Af Amer >90  >90 mL/min  CBC     Status: None   Collection Time    09/25/12  3:35 AM      Result Value Range   WBC 6.0  4.0 - 10.5 K/uL   RBC 3.96  3.87 - 5.11 MIL/uL   Hemoglobin 12.5  12.0 - 15.0 g/dL   HCT 04.5  40.9 - 81.1 %   MCV 92.4  78.0 - 100.0 fL   MCH 31.6  26.0 - 34.0 pg   MCHC 34.2  30.0 - 36.0 g/dL   RDW 91.4  78.2 - 95.6 %   Platelets 180  150 - 400 K/uL  MAGNESIUM     Status: None   Collection Time    09/25/12  3:35 AM      Result Value Range   Magnesium 1.7  1.5 - 2.5 mg/dL  PHOSPHORUS     Status: None   Collection Time    09/25/12  3:35 AM      Result Value Range   Phosphorus 4.0  2.3 - 4.6 mg/dL    Time spent in discharge (includes decision making & examination of pt): 20 minutes  09/25/2012, 7:16 PM   Lonia Blood, MD [facility] Office  [phone] Pager [phone]  On-Call/Text Page:      [email or web address]      password [facility]

## 2012-09-25 NOTE — Progress Notes (Signed)
[facility] Progress Note [facility] TEAM 1 - Stepdown/ICU TEAM   Lori Ford WUJ:[id number] DOB: 02-Dec-1975 DOA: 09/24/2012 PCP: No primary provider on file.  Brief narrative: 37yo female who presented to the ER c/o not feeling well all day.  She came to ER by bus but before she even got into ED she had a seizure episode. She had subsequent episodes in the ED.  Per ER MD patient was noted to have deviated gaze to the right with tonic clonic activity. Patient was post-ictal afterwards. She was given ativan and became seizure free but lethargic. Per her boyfriend they are from [place] and are here visiting. She has hx of brain tumor that was removed about 1 year ago and since then she has had seizures. Per boyfriend she is taking her medications as prescribed but Neurologist noted that her medication bottles are more full than expected suggesting some non compliance. Patient reported last seizure episode 3-4 months ago. Hospitalist called for admission with Neurology to consult.   Assessment/Plan:  Seizure disorder sub therapeutic valproic acid level - meds being adjusted by Neuro team  HTN  Tobacco abuse  Code Status: FULL Family Communication: no family present at time of visit Disposition Plan: transfer to Neuro floor   Consultants: Neurology  Procedures: none  Antibiotics: none  DVT prophylaxis: SCDs  HPI/Subjective: Pt seen for f/u visit.   Objective: Blood pressure 108/62, pulse 73, temperature 99.1 F (37.3 C), temperature source Oral, resp. rate 20, height 5\' 5"  (1.651 m), weight 97.5 kg (214 lb 15.2 oz), last menstrual period 09/16/2012, SpO2 98.00%.  Intake/Output Summary (Last 24 hours) at 09/25/12 1504 Last data filed at 09/25/12 1300  Gross per 24 hour  Intake   1293 ml  Output      0 ml  Net   1293 ml    Exam: F/U exam completed  Data Reviewed: Basic Metabolic Panel:  Recent Labs Lab 09/24/12 2147 09/25/12 0335  NA 135 136  K 3.6 3.6   CL 98 102  CO2 30 25  GLUCOSE 85 101*  BUN 9 9  CREATININE 1.01 0.71  CALCIUM 9.1 9.0  MG  --  1.7  PHOS  --  4.0   Liver Function Tests:  Recent Labs Lab 09/24/12 2147 09/25/12 0335  AST 14 12  ALT 12 12  ALKPHOS 43 46  BILITOT 0.4 0.4  PROT 6.7 6.4  ALBUMIN 3.6 3.5   CBC:  Recent Labs Lab 09/24/12 2147 09/25/12 0335  WBC 4.7 6.0  NEUTROABS 1.9  --   HGB 12.8 12.5  HCT 37.7 36.6  MCV 93.3 92.4  PLT 196 180    Recent Results (from the past 240 hour(s))  MRSA PCR SCREENING     Status: None   Collection Time    09/25/12  1:36 AM      Result Value Range Status   MRSA by PCR NEGATIVE  NEGATIVE Final   Comment:            The GeneXpert MRSA Assay (FDA     approved for NASAL specimens     only), is one component of a     comprehensive MRSA colonization     surveillance program. It is not     intended to diagnose MRSA     infection nor to guide or     monitor treatment for     MRSA infections.     Studies:  Recent x-ray studies have been reviewed in detail by  the Attending Physician  Scheduled Meds:  Scheduled Meds: . fluticasone  1 spray Each Nare BID  . fluticasone  1 puff Inhalation BID  . folic acid  1 mg Oral Daily  . gabapentin  800 mg Oral BID  . levETIRAcetam  1,000 mg Oral Q8H  . pantoprazole  40 mg Oral Daily  . propranolol  10 mg Oral BID  . QUEtiapine  300 mg Oral QHS  . sodium chloride  3 mL Intravenous Q12H  . valproic acid  500 mg Oral Q8H    Time spent on care of this patient: 25+mins   [facility] T  [facility] Office  [phone] Pager - Text Page per Loretha Stapler as per below:  On-Call/Text Page:      [email or web address]      password [email or web address]  If 7PM-7AM, please contact night-coverage [email or web address] Password [email or web address] 09/25/2012, 3:04 PM   LOS: 1 day

## 2012-09-25 NOTE — H&P (Signed)
PCP: Not local   Chief Complaint:  seizure  HPI: Lori Ford is a 37 y.o. female   has a past medical history of Seizures; Cervical cancer; and Seizure disorder, grand mal.   Presented with  She was not feeling well all day they came to ER by bus before she even got into ED she had a seizure episode. PEr ER MD patient was noted to have deviated gaze to the right and proceeded to have tonic clonic activity. Patient is post- ictal afterwards. For the total of 3 episodes. She was brought in and given 4 ativan now seizure activity free but lethargic. Not able to provide her own hx. Per her boyfriend they are from [place] here visiting. She has hx of brain tumor that was removed about 1 year ago and since then she have had seizures. Per boyfriend she is taking her medications as prescribed but neurologist noted that her medication bottles are more full than expected suggesting some non compliance. Patient did report last seizure episode 3-4 months ago. Hospitalist called for an admission with neurology to consult.   Review of Systems:   Unable to obtain as patient is lethargic  Past Medical History: Past Medical History  Diagnosis Date  . Seizures   . Cervical cancer   . Seizure disorder, grand mal    Past Surgical History  Procedure Laterality Date  . Brain surgery       Medications: Prior to Admission medications   Medication Sig Start Date End Date Taking? Authorizing Provider  albuterol (PROVENTIL HFA;VENTOLIN HFA) 108 (90 BASE) MCG/ACT inhaler Inhale 2 puffs into the lungs every 6 (six) hours as needed for wheezing.   Yes Historical Provider, MD  beclomethasone (QVAR) 40 MCG/ACT inhaler Inhale 1 puff into the lungs 2 (two) times daily.   Yes Historical Provider, MD  diazepam (VALIUM) 5 MG tablet Take 10 mg by mouth 2 (two) times daily as needed for anxiety.   Yes Historical Provider, MD  diphenhydrAMINE (SOMINEX) 25 MG tablet Take 50 mg by mouth every 6 (six) hours as needed for  sleep.   Yes Historical Provider, MD  divalproex (DEPAKOTE) 500 MG DR tablet Take 500 mg by mouth 3 (three) times daily.   Yes Historical Provider, MD  fexofenadine (ALLEGRA) 60 MG tablet Take 60 mg by mouth daily.   Yes Historical Provider, MD  fluticasone (FLONASE) 50 MCG/ACT nasal spray Place 1 spray into the nose 2 (two) times daily.   Yes Historical Provider, MD  folic acid (FOLVITE) 1 MG tablet Take 1 mg by mouth daily.   Yes Historical Provider, MD  gabapentin (NEURONTIN) 800 MG tablet Take 800 mg by mouth 2 (two) times daily.   Yes Historical Provider, MD  hydrochlorothiazide (HYDRODIURIL) 25 MG tablet Take 25 mg by mouth daily.   Yes Historical Provider, MD  levETIRAcetam (KEPPRA) 1000 MG tablet Take 1,000 mg by mouth 3 (three) times daily.   Yes Historical Provider, MD  loratadine-pseudoephedrine (CLARITIN-D 24-HOUR) 10-240 MG per 24 hr tablet Take 1 tablet by mouth daily.   Yes Historical Provider, MD  omeprazole (PRILOSEC) 40 MG capsule Take 40 mg by mouth daily.   Yes Historical Provider, MD  oxycodone (OXY-IR) 5 MG capsule Take 5 mg by mouth 2 (two) times daily as needed for pain.   Yes Historical Provider, MD  promethazine (PHENERGAN) 25 MG tablet Take 25 mg by mouth 2 (two) times daily as needed for nausea.   Yes Historical Provider, MD  propranolol (INDERAL)  10 MG tablet Take 10 mg by mouth 2 (two) times daily.   Yes Historical Provider, MD  QUEtiapine (SEROQUEL) 300 MG tablet Take 300 mg by mouth daily.   Yes Historical Provider, MD  sodium chloride (OCEAN) 0.65 % nasal spray Place 1 spray into the nose as needed for congestion.   Yes Historical Provider, MD    Allergies:   Allergies  Allergen Reactions  . Other Swelling    "GRAPES"    Social History:  Ambulatory  independently   Lives at home with family   reports that she has been smoking.  She does not have any smokeless tobacco history on file. She reports that she does not drink alcohol or use illicit drugs.    Family History: family history is not on file. Family history is unknown by patient.    Physical Exam: Patient Vitals for the past 24 hrs:  BP Temp Temp src Pulse Resp SpO2  09/24/12 2350 102/71 mmHg - - 80 22 100 %  09/24/12 2330 92/47 mmHg - - 96 22 96 %  09/24/12 2245 112/69 mmHg - - 92 17 98 %  09/24/12 2232 109/69 mmHg 98.7 F (37.1 C) Oral 69 18 98 %  09/24/12 2015 122/95 mmHg - - 107 25 100 %    1. General:  in No Acute distress 2. Psychological: somnolent but Oriented to situation once aroused,  3. Head/ENT:   Moist   Mucous Membranes                          Head Non traumatic, neck supple                          Normal  Dentition 4. SKIN: normal  Skin turgor,  Skin clean Dry and intact no rash 5. Heart: Regular rate and rhythm no Murmur, Rub or gallop 6. Lungs: Clear to auscultation bilaterally, no wheezes or crackles   7. Abdomen: Soft, non-tender, Non distended 8. Lower extremities: no clubbing, cyanosis, or edema 9. Neurologically Grossly intact, moving all 4 extremities equally, not able to fully cooperate with exam 10. MSK: Normal range of motion  body mass index is unknown because there is no height or weight on file.   Labs on Admission:   Recent Labs  09/24/12 2147  NA 135  K 3.6  CL 98  CO2 30  GLUCOSE 85  BUN 9  CREATININE 1.01  CALCIUM 9.1    Recent Labs  09/24/12 2147  AST 14  ALT 12  ALKPHOS 43  BILITOT 0.4  PROT 6.7  ALBUMIN 3.6   No results found for this basename: LIPASE, AMYLASE,  in the last 72 hours  Recent Labs  09/24/12 2147  WBC 4.7  NEUTROABS 1.9  HGB 12.8  HCT 37.7  MCV 93.3  PLT 196   No results found for this basename: CKTOTAL, CKMB, CKMBINDEX, TROPONINI,  in the last 72 hours No results found for this basename: TSH, T4TOTAL, FREET3, T3FREE, THYROIDAB,  in the last 72 hours No results found for this basename: VITAMINB12, FOLATE, FERRITIN, TIBC, IRON, RETICCTPCT,  in the last 72 hours No results found for  this basename: HGBA1C    CrCl is unknown because there is no height on file for the current visit. ABG No results found for this basename: phart, pco2, po2, hco3, tco2, acidbasedef, o2sat     No results found for this basename: DDIMER  Personally reviewed by me ECG  HR 107 Sinus tachycardia no evidence of ischemia  UA no evidence of infection U tox positive for benzos and THC  Cultures: No results found for this basename: sdes, specrequest, cult, reptstatus       Radiological Exams on Admission: Ct Head Wo Contrast  09/24/2012   *RADIOLOGY REPORT*  Clinical Data: Two seizures.  History of brain surgery 1 year ago. Patient is currently unresponsive.  CT HEAD WITHOUT CONTRAST  Technique:  Contiguous axial images were obtained from the base of the skull through the vertex without contrast.  Comparison: None.  Findings: Study is somewhat technically limited due to streak artifact.  Postoperative changes in the left frontal temporal region with craniotomy and plate screw fixation.  No depressed skull fractures are visualized.  Prominent extra-axial calcifications are seen along the falx and tentorium.  These calcifications may be normal variation, due to dystrophic changes, or previous infection.  There is no mass effect or midline shift. No abnormal extra-axial fluid collections.  Gray-white matter junctions are distinct.  Basal cisterns are not effaced. Suggestion of mild septal malacia in the left anterior temporal region which may represent postoperative change or artifact.  No definite evidence of any mass lesion.  No acute intracranial hemorrhage.  No ventricular dilatation.  Visualized paranasal sinuses and mastoid air cells are not opacified.  IMPRESSION: Postoperative changes on the left.  No acute intracranial abnormalities are suggested.  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   Original Report Authenticated By: Burman Nieves, M.D.   Dg Chest Port  1 View  09/24/2012   *RADIOLOGY REPORT*  Clinical Data: Seizure  PORTABLE CHEST - 1 VIEW  Comparison: None.  Findings: Mild hypoventilation with mild bibasilar atelectasis.  No definite pneumonia.  No heart failure or effusion.  IMPRESSION: Hypoventilation with mild atelectasis in the lung bases.   Original Report Authenticated By: Janeece Riggers, M.D.    Chart has been reviewed  Assessment/Plan  37 yo F w hx of seizure disorder here with witnessed seizure being admitted for further work up.   Present on Admission:  . Seizure disorder, grand mal  - poorly controlled possibly due to medication non-compliance and drug use. Patient is positive for THC.  Neurology is aware, will admit for further evaluation, MRI with/ with out contrast in AM. keppra and depakene IV, seizure precautions.  Marland Kitchen HTN - hold HCTZ for now given slightly soft BP   Prophylaxis: SCD  Protonix  CODE STATUS:FULL CODE  Other plan as per orders.  I have spent a total of 55 min on this admission  Carmelina Balducci 09/25/2012, 12:26 AM
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
## 2012-09-25 NOTE — Progress Notes (Signed)
UR COMPLETED  

## 2012-09-25 NOTE — Progress Notes (Signed)
Subjective: Patient reportedly experienced an episode of unresponsiveness and shaking of her extremities at about 8:25 AM today. Nursing staff indicated patient did not respond to verbal stimulation or to sternal rub. Depakote level from this morning is pending. Level on admission was 39.2. She was loaded with a gram of Depakote, in addition to 1 g of Keppra.  Objective: Current vital signs: BP 116/76  Pulse 91  Temp(Src) 98.8 F (37.1 C) (Oral)  Resp 22  Ht 5\' 5"  (1.651 m)  Wt 97.5 kg (214 lb 15.2 oz)  BMI 35.77 kg/m2  SpO2 99%  LMP 09/16/2012  Neurologic Exam: Alert and in no acute distress. Moderately anxious and tearful. Oriented x3. Normal speech except for low volume. No facial weakness. Moved extremities equally with no signs of focal weakness.  Lab Results: Results for orders placed during the hospital encounter of 09/24/12 (from the past 48 hour(s))  CBC WITH DIFFERENTIAL     Status: Abnormal   Collection Time    09/24/12  9:47 PM      Result Value Range   WBC 4.7  4.0 - 10.5 K/uL   RBC 4.04  3.87 - 5.11 MIL/uL   Hemoglobin 12.8  12.0 - 15.0 g/dL   HCT 16.1  09.6 - 04.5 %   MCV 93.3  78.0 - 100.0 fL   MCH 31.7  26.0 - 34.0 pg   MCHC 34.0  30.0 - 36.0 g/dL   RDW 40.9  81.1 - 91.4 %   Platelets 196  150 - 400 K/uL   Neutrophils Relative % 40 (*) 43 - 77 %   Neutro Abs 1.9  1.7 - 7.7 K/uL   Lymphocytes Relative 48 (*) 12 - 46 %   Lymphs Abs 2.2  0.7 - 4.0 K/uL   Monocytes Relative 11  3 - 12 %   Monocytes Absolute 0.5  0.1 - 1.0 K/uL   Eosinophils Relative 1  0 - 5 %   Eosinophils Absolute 0.0  0.0 - 0.7 K/uL   Basophils Relative 1  0 - 1 %   Basophils Absolute 0.0  0.0 - 0.1 K/uL  COMPREHENSIVE METABOLIC PANEL     Status: Abnormal   Collection Time    09/24/12  9:47 PM      Result Value Range   Sodium 135  135 - 145 mEq/L   Potassium 3.6  3.5 - 5.1 mEq/L   Chloride 98  96 - 112 mEq/L   CO2 30  19 - 32 mEq/L   Glucose, Bld 85  70 - 99 mg/dL   BUN 9  6 -  23 mg/dL   Creatinine, Ser 7.82  0.50 - 1.10 mg/dL   Calcium 9.1  8.4 - 95.6 mg/dL   Total Protein 6.7  6.0 - 8.3 g/dL   Albumin 3.6  3.5 - 5.2 g/dL   AST 14  0 - 37 U/L   ALT 12  0 - 35 U/L   Alkaline Phosphatase 43  39 - 117 U/L   Total Bilirubin 0.4  0.3 - 1.2 mg/dL   GFR calc non Af Amer 72 (*) >90 mL/min   GFR calc Af Amer 83 (*) >90 mL/min   Comment:            The eGFR has been calculated     using the CKD EPI equation.     This calculation has not been     validated in all clinical     situations.  eGFR's persistently     <90 mL/min signify     possible Chronic Kidney Disease.  ETHANOL     Status: None   Collection Time    09/24/12  9:47 PM      Result Value Range   Alcohol, Ethyl (B) <11  0 - 11 mg/dL   Comment:            LOWEST DETECTABLE LIMIT FOR     SERUM ALCOHOL IS 11 mg/dL     FOR MEDICAL PURPOSES ONLY  ACETAMINOPHEN LEVEL     Status: None   Collection Time    09/24/12  9:47 PM      Result Value Range   Acetaminophen (Tylenol), Serum <15.0  10 - 30 ug/mL   Comment:            THERAPEUTIC CONCENTRATIONS VARY     SIGNIFICANTLY. A RANGE OF 10-30     ug/mL MAY BE AN EFFECTIVE     CONCENTRATION FOR MANY PATIENTS.     HOWEVER, SOME ARE BEST TREATED     AT CONCENTRATIONS OUTSIDE THIS     RANGE.     ACETAMINOPHEN CONCENTRATIONS     >150 ug/mL AT 4 HOURS AFTER     INGESTION AND >50 ug/mL AT 12     HOURS AFTER INGESTION ARE     OFTEN ASSOCIATED WITH TOXIC     REACTIONS.  SALICYLATE LEVEL     Status: Abnormal   Collection Time    09/24/12  9:47 PM      Result Value Range   Salicylate Lvl <2.0 (*) 2.8 - 20.0 mg/dL  VALPROIC ACID LEVEL     Status: Abnormal   Collection Time    09/24/12  9:47 PM      Result Value Range   Valproic Acid Lvl 39.2 (*) 50.0 - 100.0 ug/mL  POCT I-STAT TROPONIN I     Status: None   Collection Time    09/24/12 10:01 PM      Result Value Range   Troponin i, poc 0.01  0.00 - 0.08 ng/mL   Comment 3            Comment: Due to  the release kinetics of cTnI,     a negative result within the first hours     of the onset of symptoms does not rule out     myocardial infarction with certainty.     If myocardial infarction is still suspected,     repeat the test at appropriate intervals.  URINALYSIS, ROUTINE W REFLEX MICROSCOPIC     Status: Abnormal   Collection Time    09/24/12 11:05 PM      Result Value Range   Color, Urine YELLOW  YELLOW   APPearance CLEAR  CLEAR   Specific Gravity, Urine 1.025  1.005 - 1.030   pH 7.5  5.0 - 8.0   Glucose, UA NEGATIVE  NEGATIVE mg/dL   Hgb urine dipstick SMALL (*) NEGATIVE   Bilirubin Urine NEGATIVE  NEGATIVE   Ketones, ur 15 (*) NEGATIVE mg/dL   Protein, ur NEGATIVE  NEGATIVE mg/dL   Urobilinogen, UA 0.2  0.0 - 1.0 mg/dL   Nitrite NEGATIVE  NEGATIVE   Leukocytes, UA NEGATIVE  NEGATIVE  PREGNANCY, URINE     Status: None   Collection Time    09/24/12 11:05 PM      Result Value Range   Preg Test, Ur NEGATIVE  NEGATIVE   Comment:  THE SENSITIVITY OF THIS     METHODOLOGY IS >20 mIU/mL.  URINE RAPID DRUG SCREEN (HOSP PERFORMED)     Status: Abnormal   Collection Time    09/24/12 11:05 PM      Result Value Range   Opiates NONE DETECTED  NONE DETECTED   Cocaine NONE DETECTED  NONE DETECTED   Benzodiazepines POSITIVE (*) NONE DETECTED   Amphetamines NONE DETECTED  NONE DETECTED   Tetrahydrocannabinol POSITIVE (*) NONE DETECTED   Barbiturates NONE DETECTED  NONE DETECTED   Comment:            DRUG SCREEN FOR MEDICAL PURPOSES     ONLY.  IF CONFIRMATION IS NEEDED     FOR ANY PURPOSE, NOTIFY LAB     WITHIN 5 DAYS.                LOWEST DETECTABLE LIMITS     FOR URINE DRUG SCREEN     Drug Class       Cutoff (ng/mL)     Amphetamine      1000     Barbiturate      200     Benzodiazepine   200     Tricyclics       300     Opiates          300     Cocaine          300     THC              50  URINE MICROSCOPIC-ADD ON     Status: None   Collection Time     09/24/12 11:05 PM      Result Value Range   Squamous Epithelial / LPF RARE  RARE   RBC / HPF 0-2  <3 RBC/hpf   Bacteria, UA RARE  RARE  MRSA PCR SCREENING     Status: None   Collection Time    09/25/12  1:36 AM      Result Value Range   MRSA by PCR NEGATIVE  NEGATIVE   Comment:            The GeneXpert MRSA Assay (FDA     approved for NASAL specimens     only), is one component of a     comprehensive MRSA colonization     surveillance program. It is not     intended to diagnose MRSA     infection nor to guide or     monitor treatment for     MRSA infections.  COMPREHENSIVE METABOLIC PANEL     Status: Abnormal   Collection Time    09/25/12  3:35 AM      Result Value Range   Sodium 136  135 - 145 mEq/L   Potassium 3.6  3.5 - 5.1 mEq/L   Chloride 102  96 - 112 mEq/L   CO2 25  19 - 32 mEq/L   Glucose, Bld 101 (*) 70 - 99 mg/dL   BUN 9  6 - 23 mg/dL   Creatinine, Ser 4.09  0.50 - 1.10 mg/dL   Calcium 9.0  8.4 - 81.1 mg/dL   Total Protein 6.4  6.0 - 8.3 g/dL   Albumin 3.5  3.5 - 5.2 g/dL   AST 12  0 - 37 U/L   ALT 12  0 - 35 U/L   Alkaline Phosphatase 46  39 - 117 U/L   Total Bilirubin 0.4  0.3 - 1.2 mg/dL  GFR calc non Af Amer >90  >90 mL/min   GFR calc Af Amer >90  >90 mL/min   Comment:            The eGFR has been calculated     using the CKD EPI equation.     This calculation has not been     validated in all clinical     situations.     eGFR's persistently     <90 mL/min signify     possible Chronic Kidney Disease.  CBC     Status: None   Collection Time    09/25/12  3:35 AM      Result Value Range   WBC 6.0  4.0 - 10.5 K/uL   RBC 3.96  3.87 - 5.11 MIL/uL   Hemoglobin 12.5  12.0 - 15.0 g/dL   HCT 25.3  66.4 - 40.3 %   MCV 92.4  78.0 - 100.0 fL   MCH 31.6  26.0 - 34.0 pg   MCHC 34.2  30.0 - 36.0 g/dL   RDW 47.4  25.9 - 56.3 %   Platelets 180  150 - 400 K/uL  MAGNESIUM     Status: None   Collection Time    09/25/12  3:35 AM      Result Value Range    Magnesium 1.7  1.5 - 2.5 mg/dL  PHOSPHORUS     Status: None   Collection Time    09/25/12  3:35 AM      Result Value Range   Phosphorus 4.0  2.3 - 4.6 mg/dL    Studies/Results: Ct Head Wo Contrast  09/24/2012   *RADIOLOGY REPORT*  Clinical Data: Two seizures.  History of brain surgery 1 year ago. Patient is currently unresponsive.  CT HEAD WITHOUT CONTRAST  Technique:  Contiguous axial images were obtained from the base of the skull through the vertex without contrast.  Comparison: None.  Findings: Study is somewhat technically limited due to streak artifact.  Postoperative changes in the left frontal temporal region with craniotomy and plate screw fixation.  No depressed skull fractures are visualized.  Prominent extra-axial calcifications are seen along the falx and tentorium.  These calcifications may be normal variation, due to dystrophic changes, or previous infection.  There is no mass effect or midline shift. No abnormal extra-axial fluid collections.  Gray-white matter junctions are distinct.  Basal cisterns are not effaced. Suggestion of mild septal malacia in the left anterior temporal region which may represent postoperative change or artifact.  No definite evidence of any mass lesion.  No acute intracranial hemorrhage.  No ventricular dilatation.  Visualized paranasal sinuses and mastoid air cells are not opacified.  IMPRESSION: Postoperative changes on the left.  No acute intracranial abnormalities are suggested.  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   Original Report Authenticated By: Burman Nieves, M.D.   Dg Chest Port 1 View  09/24/2012   *RADIOLOGY REPORT*  Clinical Data: Seizure  PORTABLE CHEST - 1 VIEW  Comparison: None.  Findings: Mild hypoventilation with mild bibasilar atelectasis.  No definite pneumonia.  No heart failure or effusion.  IMPRESSION: Hypoventilation with mild atelectasis in the lung bases.   Original Report Authenticated By:  Janeece Riggers, M.D.    Medications:  Scheduled: . docusate sodium  100 mg Oral BID  . fluticasone  1 spray Each Nare BID  . fluticasone  1 puff Inhalation BID  . folic acid  1 mg Oral Daily  .  gabapentin  800 mg Oral BID  . levETIRAcetam  1,000 mg Intravenous Q8H  . pantoprazole  40 mg Oral Daily  . propranolol  10 mg Oral BID  . QUEtiapine  300 mg Oral QHS  . sodium chloride  3 mL Intravenous Q12H  . valproate sodium  500 mg Intravenous Q8H   Continuous: . 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 Compliance issues with anticonvulsant medication suspected as a contributing factor to recurrent seizure activity. Functional features of her symptomatology also suspected.  Will adjust anticonvulsant medications as indicated by Depakote level, in addition to restarting Neurontin. EEG is not indicated as mental status has returned to normal.   C.R. Roseanne Reno, MD [facility] [phone]  09/25/2012  9:41 AM

## 2012-09-25 NOTE — Progress Notes (Signed)
Pt experienced twitching to bilateral hands and feet.  Pt unresponsive to verbal/physical cues. Airway protected. Safety reinforced. Episode lasted approximately 35 seconds. Primary provider notified and neuro notified.  Will continue to monitor patient and implement safety precautions.

## 2012-09-26 ENCOUNTER — Encounter (HOSPITAL_COMMUNITY): Payer: Self-pay | Admitting: *Deleted

## 2012-09-26 ENCOUNTER — Encounter (HOSPITAL_COMMUNITY): Payer: Self-pay

## 2012-09-26 ENCOUNTER — Emergency Department (HOSPITAL_COMMUNITY): Payer: Medicaid - Out of State

## 2012-09-26 ENCOUNTER — Emergency Department (HOSPITAL_COMMUNITY)
Admission: EM | Admit: 2012-09-26 | Discharge: 2012-09-26 | Disposition: A | Discharge status: Home / Self Care | Payer: Medicaid - Out of State | Attending: Emergency Medicine | Admitting: Emergency Medicine

## 2012-09-26 DIAGNOSIS — Z91040 Latex allergy status: Secondary | ICD-10-CM | POA: Insufficient documentation

## 2012-09-26 DIAGNOSIS — Z3202 Encounter for pregnancy test, result negative: Secondary | ICD-10-CM | POA: Insufficient documentation

## 2012-09-26 DIAGNOSIS — G40309 Generalized idiopathic epilepsy and epileptic syndromes, not intractable, without status epilepticus: Secondary | ICD-10-CM | POA: Insufficient documentation

## 2012-09-26 DIAGNOSIS — J45909 Unspecified asthma, uncomplicated: Secondary | ICD-10-CM | POA: Insufficient documentation

## 2012-09-26 DIAGNOSIS — IMO0002 Reserved for concepts with insufficient information to code with codable children: Secondary | ICD-10-CM | POA: Insufficient documentation

## 2012-09-26 DIAGNOSIS — G40901 Epilepsy, unspecified, not intractable, with status epilepticus: Secondary | ICD-10-CM

## 2012-09-26 DIAGNOSIS — Z79899 Other long term (current) drug therapy: Secondary | ICD-10-CM | POA: Insufficient documentation

## 2012-09-26 DIAGNOSIS — Z8541 Personal history of malignant neoplasm of cervix uteri: Secondary | ICD-10-CM | POA: Insufficient documentation

## 2012-09-26 DIAGNOSIS — G40401 Other generalized epilepsy and epileptic syndromes, not intractable, with status epilepticus: Secondary | ICD-10-CM | POA: Insufficient documentation

## 2012-09-26 DIAGNOSIS — F17200 Nicotine dependence, unspecified, uncomplicated: Secondary | ICD-10-CM | POA: Insufficient documentation

## 2012-09-26 DIAGNOSIS — Z9889 Other specified postprocedural states: Secondary | ICD-10-CM | POA: Insufficient documentation

## 2012-09-26 LAB — CBC WITH DIFFERENTIAL/PLATELET
Hemoglobin: 14.2 g/dL (ref 12.0–15.0)
Lymphocytes Relative: 29 % (ref 12–46)
Lymphs Abs: 2.3 10*3/uL (ref 0.7–4.0)
Monocytes Relative: 9 % (ref 3–12)
Neutro Abs: 4.9 10*3/uL (ref 1.7–7.7)
Neutrophils Relative %: 61 % (ref 43–77)
Platelets: 203 10*3/uL (ref 150–400)
RBC: 4.42 MIL/uL (ref 3.87–5.11)
WBC: 8 10*3/uL (ref 4.0–10.5)

## 2012-09-26 LAB — COMPREHENSIVE METABOLIC PANEL
ALT: 13 U/L (ref 0–35)
Alkaline Phosphatase: 52 U/L (ref 39–117)
BUN: 9 mg/dL (ref 6–23)
BUN: 11 mg/dL (ref 6–23)
CO2: 23 mEq/L (ref 19–32)
Calcium: 9 mg/dL (ref 8.4–10.5)
Chloride: 104 mEq/L (ref 96–112)
Creatinine, Ser: 0.77 mg/dL (ref 0.50–1.10)
GFR calc Af Amer: >90 mL/min (ref >90)
GFR calc Af Amer: >90 mL/min (ref >90)
GFR calc non Af Amer: >90 mL/min (ref >90)
Glucose, Bld: 83 mg/dL (ref 70–99)
Glucose, Bld: 88 mg/dL (ref 70–99)
Potassium: 3.5 mEq/L (ref 3.5–5.1)
Sodium: 137 mEq/L (ref 135–145)
Total Bilirubin: 0.5 mg/dL (ref 0.3–1.2)

## 2012-09-26 LAB — PHOSPHORUS: Phosphorus: 2.6 mg/dL (ref 2.3–4.6)

## 2012-09-26 LAB — PHENYTOIN LEVEL, TOTAL: Phenytoin Lvl: <2.5 ug/mL — ABNORMAL LOW (ref 10.0–20.0)

## 2012-09-26 LAB — GLUCOSE, CAPILLARY

## 2012-09-26 LAB — POCT I-STAT 3, ART BLOOD GAS (G3+)
Bicarbonate: 25.7 mEq/L — ABNORMAL HIGH (ref 20.0–24.0)
O2 Saturation: 100 %
TCO2: 27 mmol/L (ref 0–100)
pCO2 arterial: 39.2 mmHg (ref 35.0–45.0)
pO2, Arterial: 255 mmHg — ABNORMAL HIGH (ref 80.0–100.0)

## 2012-09-26 LAB — MAGNESIUM: Magnesium: 1.8 mg/dL (ref 1.5–2.5)

## 2012-09-26 LAB — CBC
Platelets: 188 10*3/uL (ref 150–400)
RDW: 13.4 % (ref 11.5–15.5)
WBC: 6.7 10*3/uL (ref 4.0–10.5)

## 2012-09-26 LAB — RAPID URINE DRUG SCREEN, HOSP PERFORMED: Opiates: NOT DETECTED

## 2012-09-26 MED ORDER — HYDROCODONE-ACETAMINOPHEN 5-325 MG PO TABS
1.0000 | ORAL_TABLET | Freq: Once | ORAL | Status: AC
  Administered 2012-09-26: 1 via ORAL
  Filled 2012-09-26: qty 1

## 2012-09-26 MED ORDER — DEXTROSE 50 % IV SOLN
INTRAVENOUS | Status: AC
  Administered 2012-09-26: 50 mL
  Filled 2012-09-26: qty 50

## 2012-09-26 MED ORDER — SODIUM CHLORIDE 0.9 % IV SOLN: 2000.0000 mg | Freq: Once | INTRAVENOUS | Status: DC

## 2012-09-26 MED ORDER — LEVETIRACETAM 1000 MG PO TABS: 1000.0000 mg | ORAL_TABLET | Freq: Three times a day (TID) | ORAL | Status: DC

## 2012-09-26 MED ORDER — PROMETHAZINE HCL 25 MG PO TABS: 25.0000 mg | ORAL_TABLET | Freq: Two times a day (BID) | ORAL | Status: DC | PRN

## 2012-09-26 MED ORDER — DIVALPROEX SODIUM 500 MG PO DR TAB: 500.0000 mg | DELAYED_RELEASE_TABLET | Freq: Three times a day (TID) | ORAL | Status: DC

## 2012-09-26 MED ORDER — LORAZEPAM 2 MG/ML IJ SOLN
INTRAMUSCULAR | Status: AC
  Administered 2012-09-26: 2 mg
  Filled 2012-09-26: qty 1

## 2012-09-26 MED ORDER — SODIUM CHLORIDE 0.9 % IV SOLN
1750.0000 mg | Freq: Once | INTRAVENOUS | Status: AC
  Administered 2012-09-26: 1750 mg via INTRAVENOUS
  Filled 2012-09-26: qty 35

## 2012-09-26 MED ORDER — PROPRANOLOL HCL 10 MG PO TABS: 10.0000 mg | ORAL_TABLET | Freq: Two times a day (BID) | ORAL | Status: DC

## 2012-09-26 MED ORDER — DIAZEPAM 5 MG PO TABS: 10.0000 mg | ORAL_TABLET | Freq: Two times a day (BID) | ORAL | Status: DC | PRN

## 2012-09-26 MED ORDER — GABAPENTIN 800 MG PO TABS: 800.0000 mg | ORAL_TABLET | Freq: Two times a day (BID) | ORAL | Status: DC

## 2012-09-26 NOTE — ED Notes (Signed)
Case Management in to speak with patient and boyfriend.

## 2012-09-26 NOTE — ED Notes (Signed)
Contacted pt's boyfriend to ask him to have her mother call her per pt request.

## 2012-09-26 NOTE — ED Notes (Signed)
Per [facility], hx of seizures, left AMA from 2600 yesterday for seizures. Was given 3 doses IM versed 5 mg each. Seizing on EMS arrival, clinched, eyes fluttering, and violently convulsing. Boyfriend states she has never seized like this before with him. They have been together for the past year.

## 2012-09-26 NOTE — ED Notes (Addendum)
Pt was seen here last night and left AMA pt continued to have seizure throughout the night and then came back this morning and was seen for seizures. Pt states that she was discharged this morning and she continued to have seizures today. Pt states that her left side is sore from the seizures. Pt is aware of when her seizures will take place and during the warm weather she has more seizures. Pt states brain surgery last march and thought she would not have more seizure after. Pt is on seizure medications dilantin.

## 2012-09-26 NOTE — ED Notes (Signed)
Case management back at bedside to allow pt to speak via phone with Social work.

## 2012-09-26 NOTE — Progress Notes (Signed)
Updated from Marylu Lund in CSW- that patient will need to call contacts on shelter resource information.[facility] bus pass can also be provided to patient if required.

## 2012-09-26 NOTE — Progress Notes (Signed)
Provided patient with resource sheet for the [facility].Patient verbalized her understanding of this resource.Patient reports she needs housing assistance and does not  Have any funds.CSW Janet-contacted.She will speak with the patient via telephone to offer patient discharge options. Patient updated that CSW contacted and will be calling her to  Follow up. Patient receptive to this and verbalized her understanding of the current plan.

## 2012-09-26 NOTE — ED Notes (Signed)
Dr. Jeraldine Loots back in to see patient.

## 2012-09-26 NOTE — ED Notes (Signed)
IV pump beeping, this RN at the bedside to reset pump, pt on the phone with dietary ordering her meal.

## 2012-09-26 NOTE — ED Notes (Signed)
Phlebotomy at the bedside  
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
## 2012-09-26 NOTE — ED Notes (Signed)
Pt very tearful and admits to being homeless. States she got upset and left yesterday because she had money lying on the table with her tray and it disappeared.

## 2012-09-26 NOTE — ED Notes (Signed)
Dr. Lockwood at the bedside. 
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
## 2012-09-27 ENCOUNTER — Emergency Department (HOSPITAL_COMMUNITY)
Admission: EM | Admit: 2012-09-27 | Discharge: 2012-09-27 | Disposition: A | Discharge status: Home / Self Care | Payer: Medicaid - Out of State | Attending: Emergency Medicine | Admitting: Emergency Medicine

## 2012-09-27 DIAGNOSIS — G40909 Epilepsy, unspecified, not intractable, without status epilepticus: Secondary | ICD-10-CM

## 2012-09-27 LAB — VALPROIC ACID LEVEL: Valproic Acid Lvl: 12.5 ug/mL — ABNORMAL LOW (ref 50.0–100.0)

## 2012-09-27 MED ORDER — DIVALPROEX SODIUM 250 MG PO DR TAB
500.0000 mg | DELAYED_RELEASE_TABLET | Freq: Once | ORAL | Status: AC
  Administered 2012-09-27: 500 mg via ORAL
  Filled 2012-09-27: qty 2
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
## 2012-09-27 NOTE — ED Notes (Signed)
Pt walked down hallway with out problem.  md aware.

## 2012-09-27 NOTE — ED Notes (Signed)
Pt dc to home. Pt sts understanding to dc instruction. Pt ambulatory to exit without difficulty. Pt denies need for w/c.

## 2012-09-28 ENCOUNTER — Emergency Department (HOSPITAL_COMMUNITY): Payer: Medicaid - Out of State

## 2012-09-28 ENCOUNTER — Emergency Department (HOSPITAL_COMMUNITY)
Admission: EM | Admit: 2012-09-28 | Discharge: 2012-09-28 | Discharge status: Against Medical Advice | Payer: Medicaid - Out of State | Attending: Emergency Medicine | Admitting: Emergency Medicine

## 2012-09-28 DIAGNOSIS — F17200 Nicotine dependence, unspecified, uncomplicated: Secondary | ICD-10-CM | POA: Insufficient documentation

## 2012-09-28 DIAGNOSIS — F411 Generalized anxiety disorder: Secondary | ICD-10-CM | POA: Insufficient documentation

## 2012-09-28 DIAGNOSIS — R569 Unspecified convulsions: Secondary | ICD-10-CM

## 2012-09-28 DIAGNOSIS — R4182 Altered mental status, unspecified: Secondary | ICD-10-CM | POA: Insufficient documentation

## 2012-09-28 DIAGNOSIS — IMO0002 Reserved for concepts with insufficient information to code with codable children: Secondary | ICD-10-CM | POA: Insufficient documentation

## 2012-09-28 DIAGNOSIS — R911 Solitary pulmonary nodule: Secondary | ICD-10-CM | POA: Insufficient documentation

## 2012-09-28 DIAGNOSIS — G40909 Epilepsy, unspecified, not intractable, without status epilepticus: Secondary | ICD-10-CM | POA: Insufficient documentation

## 2012-09-28 DIAGNOSIS — Z91040 Latex allergy status: Secondary | ICD-10-CM | POA: Insufficient documentation

## 2012-09-28 DIAGNOSIS — Z8541 Personal history of malignant neoplasm of cervix uteri: Secondary | ICD-10-CM | POA: Insufficient documentation

## 2012-09-28 DIAGNOSIS — J45909 Unspecified asthma, uncomplicated: Secondary | ICD-10-CM | POA: Insufficient documentation

## 2012-09-28 DIAGNOSIS — Z79899 Other long term (current) drug therapy: Secondary | ICD-10-CM | POA: Insufficient documentation

## 2012-09-28 DIAGNOSIS — R000 Tachycardia, unspecified: Secondary | ICD-10-CM | POA: Insufficient documentation

## 2012-09-28 MED ORDER — SODIUM CHLORIDE 0.9 % IV BOLUS (SEPSIS): 1000.0000 mL | Freq: Once | INTRAVENOUS | Status: DC
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
## 2012-09-28 NOTE — ED Notes (Signed)
[id number]<AV> Expected date:<BR> Expected time:<BR> Means of arrival:<BR> Comments:<BR> seizure

## 2012-09-28 NOTE — ED Notes (Signed)
Lab stated that the valproic acid test is a send out to [facility]/approx. One hour

## 2012-09-28 NOTE — ED Notes (Signed)
Patient left without signing discharge papers.

## 2012-09-28 NOTE — ED Notes (Signed)
Depakote level still pending from [facility].

## 2012-09-28 NOTE — ED Notes (Signed)
MD at bedside. 

## 2012-09-28 NOTE — ED Provider Notes (Signed)
History     CSN: [id number]  Arrival date & time 09/28/12  1616   First MD Initiated Contact with Patient 09/28/12 1655      Chief Complaint  Patient presents with  . Seizures    (Consider location/radiation/quality/duration/timing/severity/associated sxs/prior treatment) HPI This 37 year old female arrives for her fourth visit within the last week for apparent seizures, she is homeless, she was found outside of a public office building lying on the ground apparently confused possibly postictal, she complains of a headache and neck pain from apparently falling although this was unwitnessed, she is crying and anxious tearful saying that she has lost track of her fianc and 4 year old daughter has no idea what they are, she is oriented to person and place not to time, she denies any threats or herself or others denies suicidal or homicidal ideation or hallucinations she denies chest pain shortness breath abdominal pain back pain extremity pains or focal or lateralizing weakness or numbness or change in speech or vision or swallowing. She is supposed to be on Neurontin Keppra and valproic acid and apparently received a prescription for Dilantin although the recent neurology consultation does not recommend Dilantin and the patient states she is not supposed to be on Dilantin and has not taken and did not fill that prescription. Past Medical History  Diagnosis Date  . Seizure disorder, grand mal   . Seizures     epilepsy  . Cervical cancer     2013  . Asthma     Past Surgical History  Procedure Laterality Date  . Brain surgery      2013    No family history on file.  History  Substance Use Topics  . Smoking status: Current Every Day Smoker -- 0.25 packs/day for 16 years    Types: Cigarettes  . Smokeless tobacco: Not on file  . Alcohol Use: No    OB History   Grav Para Term Preterm Abortions TAB SAB Ect Mult Living                  Review of Systems  Unable to perform  ROS: Mental status change    Allergies  Ibuprofen; Latex; Other; and Aspirin  Home Medications   Current Outpatient Rx  Name  Route  Sig  Dispense  Refill  . albuterol (PROVENTIL HFA;VENTOLIN HFA) 108 (90 BASE) MCG/ACT inhaler   Inhalation   Inhale 2 puffs into the lungs every 6 (six) hours as needed for wheezing.         . beclomethasone (QVAR) 40 MCG/ACT inhaler   Inhalation   Inhale 1 puff into the lungs 2 (two) times daily.         . diazepam (VALIUM) 5 MG tablet   Oral   Take 2 tablets (10 mg total) by mouth 2 (two) times daily as needed for anxiety.   15 tablet   0   . diphenhydrAMINE (SOMINEX) 25 MG tablet   Oral   Take 50 mg by mouth every 6 (six) hours as needed for sleep.         . divalproex (DEPAKOTE) 500 MG DR tablet   Oral   Take 1 tablet (500 mg total) by mouth 3 (three) times daily.   21 tablet   0   . fexofenadine (ALLEGRA) 60 MG tablet   Oral   Take 60 mg by mouth daily.         . fluticasone (FLONASE) 50 MCG/ACT nasal spray   Nasal  Place 1 spray into the nose 2 (two) times daily.         . folic acid (FOLVITE) 1 MG tablet   Oral   Take 1 mg by mouth daily.         Marland Kitchen gabapentin (NEURONTIN) 800 MG tablet   Oral   Take 1 tablet (800 mg total) by mouth 2 (two) times daily.   14 tablet   0   . hydrochlorothiazide (HYDRODIURIL) 25 MG tablet   Oral   Take 25 mg by mouth daily.         Marland Kitchen levETIRAcetam (KEPPRA) 1000 MG tablet   Oral   Take 1 tablet (1,000 mg total) by mouth 3 (three) times daily.   21 tablet   0   . loratadine-pseudoephedrine (CLARITIN-D 24-HOUR) 10-240 MG per 24 hr tablet   Oral   Take 1 tablet by mouth daily.         Marland Kitchen omeprazole (PRILOSEC) 40 MG capsule   Oral   Take 40 mg by mouth daily.         Marland Kitchen oxycodone (OXY-IR) 5 MG capsule   Oral   Take 5 mg by mouth 2 (two) times daily as needed for pain.         . promethazine (PHENERGAN) 25 MG tablet   Oral   Take 1 tablet (25 mg total) by mouth 2  (two) times daily as needed for nausea.   10 tablet   0   . propranolol (INDERAL) 10 MG tablet   Oral   Take 1 tablet (10 mg total) by mouth 2 (two) times daily.   14 tablet   0   . sodium chloride (OCEAN) 0.65 % nasal spray   Nasal   Place 1 spray into the nose as needed for congestion.           BP 113/70  Pulse 84  Temp(Src) 98.6 F (37 C) (Oral)  Resp 16  SpO2 100%  LMP 09/16/2012  Physical Exam  Nursing note and vitals reviewed. Constitutional:  Awake, alert, nontoxic appearance with baseline speech for patient.  HENT:  Head: Atraumatic.  Mouth/Throat: No oropharyngeal exudate.  No tongue biting noted  Eyes: EOM are normal. Pupils are equal, round, and reactive to light. Right eye exhibits no discharge. Left eye exhibits no discharge.  Neck: Neck supple.  Cervical spine and posterior neck diffusely tender  Cardiovascular: Regular rhythm.   No murmur heard. Tachycardic  Pulmonary/Chest: Effort normal and breath sounds normal. No stridor. No respiratory distress. She has no wheezes. She has no rales. She exhibits no tenderness.  Abdominal: Soft. Bowel sounds are normal. She exhibits no mass. There is no tenderness. There is no rebound.  Musculoskeletal: She exhibits no tenderness.  Baseline ROM, moves extremities with no obvious new focal weakness.  Lymphadenopathy:    She has no cervical adenopathy.  Neurological: She is alert.  Awake, alert, oriented to person and place not time, she cooperates with some simple commands; motor strength 5/5 bilaterally; sensation normal to light touch bilaterally; peripheral visual fields full to confrontation; no facial asymmetry; tongue midline; major cranial nerves appear intact; no pronator drift, normal finger to nose bilaterally   Skin: No rash noted.  Psychiatric:  Anxious and tearful denies SI/HI/hallucinations    ED Course  Procedures (including critical care time) ECG: Normal sinus rhythm, ventricular rate 95,  normal axis, normal intervals, no acute ischemic changes noted, no significant change compared with 09/26/2012  1800 awake alert  following commands well still anxious but definitely much calmer informed of CT results including lung nodule requiring f/u.  Requested that RN check on lab result and consult social work since [employer].  Valproic acid level still pending discussed with lab will check on result. 2050  Pt states no shelters have beds available for both she and her boyfriend together and she wants discharge.  Apparently the patient's personal social worker saw the patient in the emergency department, I apologized to the patient that her lab result was pending for over 4 hours since the it turns out the lab machine was broken, the patient's boyfriend was in the emergency department and threatens a lawsuit if anything ever happens to her when she leaves the emergency department tonight since she has been here multiple times for the same problem, she is not in status epilepticus and has normal speech and gait in the ED, the boyfriend then made some comments about racial issues that I could not understand, I apologized that I could not understand him but he appeared to get more angry and walked out. 2150  Labs Reviewed  VALPROIC ACID LEVEL - Abnormal; Notable for the following:    Valproic Acid Lvl 11.8 (*)    All other components within normal limits   Ct Head Wo Contrast  09/28/2012   *RADIOLOGY REPORT*  Clinical Data:  Possible seizure, history of epilepsy and cervical cancer, history of brain surgery (2013), now with left-sided neck pain and had a  CT HEAD WITHOUT CONTRAST CT CERVICAL SPINE WITHOUT CONTRAST  Technique:  Multidetector CT imaging of the head and cervical spine was performed following the standard protocol without intravenous contrast.  Multiplanar CT image reconstructions of the cervical spine were also generated.  Comparison:  09/26/2012; 09/24/2012  CT HEAD  Findings:   Redemonstrated advanced atrophy with prominence of the bifrontal extra-axial spaces. Gray white differentiation is maintained.  No CT evidence of acute large territory infarct.  No intraparenchymal or extra-axial mass or hemorrhage.  Redemonstrated exuberant calcification of the midline falx and cerebellar tentorium. Normal size and configuration of the ventricles and basilar cisterns.  No midline shift.  Limited visualization of the paranasal sinuses is normal.  Stable sequela of left parietal lobe craniotomy.  No displaced calvarial fracture.  IMPRESSION: 1.  No acute intracranial process. 2.  Postsurgical change of the left parietal calvarium.  Age advanced atrophy.  CT CERVICAL SPINE  Findings:  C1 to the superior endplate of T3 is imaged.  There is straightening and slight reversal of the expected cervical lordosis with mild kyphosis centered about the C5 - C6 intervertebral disc space. The bilateral facets are normally aligned.  The dens is normally positioned and the lateral masses of C1.  A normal atlantodental and atlanto-axial articulations.  Cervical vertebral body heights are preserved.  Prevertebral soft tissues are normal.  There is mild to moderate multilevel cervical spine DDD, worse at C5 - C6 and C6 - C7 with disc space height loss, end plate irregularity and sclerosis.  Limited visualization of the lung apices demonstrates an approximately 0.5 x 0.5 cm nodule within the right lung apex (image 79, series four).  Shoddy right-sided lymph nodes within the subcutaneous tissues of the posterior aspect of the neck are not enlarged by CT criteria with index node measuring 7 mm in greatest short axis diameter (image 29, series six).  Regional soft tissues are normal.  IMPRESSION: 1.  No fracture static subluxation of the cervical spine. 2.  Mild  to moderate multilevel cervical spine DDD, worse at C5 - C6 and C6 - C7. 3.  Incidental note made of a approximately 5 mm nodule within the imaged right lung  apex.   If the patient is at high risk for bronchogenic carcinoma, follow-up chest CT at 6-12 months is recommended.  If the patient is at low risk for bronchogenic carcinoma, follow-up chest CT at 12 months is recommended.  This recommendation follows the consensus statement: Guidelines for Management of Small Pulmonary Nodules Detected on CT Scans: A Statement from the Fleischner Society as published in Radiology 2005; 237:395-400.   Original Report Authenticated By: Tacey Ruiz, MD   Ct Cervical Spine Wo Contrast  09/28/2012   *RADIOLOGY REPORT*  Clinical Data:  Possible seizure, history of epilepsy and cervical cancer, history of brain surgery (2013), now with left-sided neck pain and had a  CT HEAD WITHOUT CONTRAST CT CERVICAL SPINE WITHOUT CONTRAST  Technique:  Multidetector CT imaging of the head and cervical spine was performed following the standard protocol without intravenous contrast.  Multiplanar CT image reconstructions of the cervical spine were also generated.  Comparison:  09/26/2012; 09/24/2012  CT HEAD  Findings:  Redemonstrated advanced atrophy with prominence of the bifrontal extra-axial spaces. Gray white differentiation is maintained.  No CT evidence of acute large territory infarct.  No intraparenchymal or extra-axial mass or hemorrhage.  Redemonstrated exuberant calcification of the midline falx and cerebellar tentorium. Normal size and configuration of the ventricles and basilar cisterns.  No midline shift.  Limited visualization of the paranasal sinuses is normal.  Stable sequela of left parietal lobe craniotomy.  No displaced calvarial fracture.  IMPRESSION: 1.  No acute intracranial process. 2.  Postsurgical change of the left parietal calvarium.  Age advanced atrophy.  CT CERVICAL SPINE  Findings:  C1 to the superior endplate of T3 is imaged.  There is straightening and slight reversal of the expected cervical lordosis with mild kyphosis centered about the C5 - C6 intervertebral  disc space. The bilateral facets are normally aligned.  The dens is normally positioned and the lateral masses of C1.  A normal atlantodental and atlanto-axial articulations.  Cervical vertebral body heights are preserved.  Prevertebral soft tissues are normal.  There is mild to moderate multilevel cervical spine DDD, worse at C5 - C6 and C6 - C7 with disc space height loss, end plate irregularity and sclerosis.  Limited visualization of the lung apices demonstrates an approximately 0.5 x 0.5 cm nodule within the right lung apex (image 79, series four).  Shoddy right-sided lymph nodes within the subcutaneous tissues of the posterior aspect of the neck are not enlarged by CT criteria with index node measuring 7 mm in greatest short axis diameter (image 29, series six).  Regional soft tissues are normal.  IMPRESSION: 1.  No fracture static subluxation of the cervical spine. 2.  Mild to moderate multilevel cervical spine DDD, worse at C5 - C6 and C6 - C7. 3.  Incidental note made of a approximately 5 mm nodule within the imaged right lung apex.   If the patient is at high risk for bronchogenic carcinoma, follow-up chest CT at 6-12 months is recommended.  If the patient is at low risk for bronchogenic carcinoma, follow-up chest CT at 12 months is recommended.  This recommendation follows the consensus statement: Guidelines for Management of Small Pulmonary Nodules Detected on CT Scans: A Statement from the Fleischner Society as published in Radiology 2005; 237:395-400.   Original Report Authenticated  By: Tacey Ruiz, MD     1. Seizure   Lung Nodule    MDM  I doubt any other EMC precluding discharge at this time including, but not necessarily limited to the following:status epilepticus.  Addendum 0330: Due to patient wanting to quickly leave I forgot to include the recommendation of CT chest in 6 months to follow-up incidental RUL nodule so Flow Mgr called given message to contact Pt later today to remind  her about the nodule and need for follow-up.  Pt had already been informed verbally during ED stay of need for nodule follow-up and understood.      Hurman Horn, MD 09/29/12 [phone]

## 2012-09-29 ENCOUNTER — Telehealth (HOSPITAL_COMMUNITY): Payer: Self-pay | Admitting: Emergency Medicine

## 2012-09-29 NOTE — ED Notes (Signed)
Asked by ED MD that saw pt 6/16 to call pt and remind her to f/u in 6 months w/a CT scan  of chest for incidental finding of lung nodule during visit and f/u w/neurologist for seizures within a week.  Left message for pt to return call.

## 2012-10-01 ENCOUNTER — Encounter (HOSPITAL_COMMUNITY): Payer: Self-pay | Admitting: Emergency Medicine

## 2012-10-01 ENCOUNTER — Emergency Department (HOSPITAL_COMMUNITY)
Admission: EM | Admit: 2012-10-01 | Discharge: 2012-10-01 | Disposition: A | Discharge status: Home / Self Care | Payer: Medicaid - Out of State | Attending: Emergency Medicine | Admitting: Emergency Medicine

## 2012-10-01 DIAGNOSIS — Z8559 Personal history of malignant neoplasm of other urinary tract organ: Secondary | ICD-10-CM | POA: Insufficient documentation

## 2012-10-01 DIAGNOSIS — Z91040 Latex allergy status: Secondary | ICD-10-CM | POA: Insufficient documentation

## 2012-10-01 DIAGNOSIS — Z79899 Other long term (current) drug therapy: Secondary | ICD-10-CM | POA: Insufficient documentation

## 2012-10-01 DIAGNOSIS — M79605 Pain in left leg: Secondary | ICD-10-CM

## 2012-10-01 DIAGNOSIS — G40309 Generalized idiopathic epilepsy and epileptic syndromes, not intractable, without status epilepticus: Secondary | ICD-10-CM | POA: Insufficient documentation

## 2012-10-01 DIAGNOSIS — M79609 Pain in unspecified limb: Secondary | ICD-10-CM | POA: Insufficient documentation

## 2012-10-01 DIAGNOSIS — I809 Phlebitis and thrombophlebitis of unspecified site: Secondary | ICD-10-CM

## 2012-10-01 DIAGNOSIS — F17200 Nicotine dependence, unspecified, uncomplicated: Secondary | ICD-10-CM | POA: Insufficient documentation

## 2012-10-01 DIAGNOSIS — J45909 Unspecified asthma, uncomplicated: Secondary | ICD-10-CM | POA: Insufficient documentation

## 2012-10-01 DIAGNOSIS — G40909 Epilepsy, unspecified, not intractable, without status epilepticus: Secondary | ICD-10-CM | POA: Insufficient documentation

## 2012-10-01 LAB — PHENYTOIN LEVEL, FREE AND TOTAL: Phenytoin, Free: 1.4 mg/L (ref 1.0–2.0)

## 2012-10-01 MED ORDER — HYDROCODONE-ACETAMINOPHEN 5-325 MG PO TABS: 1.0000 | ORAL_TABLET | ORAL | Status: DC | PRN

## 2012-10-01 MED ORDER — OXYCODONE-ACETAMINOPHEN 5-325 MG PO TABS
2.0000 | ORAL_TABLET | Freq: Once | ORAL | Status: AC
  Administered 2012-10-01: 2 via ORAL
  Filled 2012-10-01: qty 2

## 2012-10-01 NOTE — ED Notes (Signed)
Pt c/o left leg pain and swelling after having IV start there 2 days ago

## 2012-10-01 NOTE — ED Provider Notes (Signed)
History     CSN: [id number]  Arrival date & time 10/01/12  1800   First MD Initiated Contact with Patient 10/01/12 1936      Chief Complaint  Patient presents with  . Leg Pain    (Consider location/radiation/quality/duration/timing/severity/associated sxs/prior treatment) Patient is a 37 y.o. female presenting with leg pain. The history is provided by the patient.  Leg Pain Location:  Leg Leg location:  L leg Pain details:    Quality:  Aching   Radiates to:  Does not radiate   Severity:  Moderate   Onset quality:  Gradual   Duration:  2 days   Timing:  Constant   Progression:  Worsening Chronicity:  New Dislocation: no   Relieved by:  Nothing Worsened by:  Bearing weight Ineffective treatments:  None tried Associated symptoms: no decreased ROM, no fatigue, no fever, no numbness, no stiffness, no swelling and no tingling     Past Medical History  Diagnosis Date  . Seizure disorder, grand mal   . Seizures     epilepsy  . Cervical cancer     2013  . Asthma     Past Surgical History  Procedure Laterality Date  . Brain surgery      2013    History reviewed. No pertinent family history.  History  Substance Use Topics  . Smoking status: Current Every Day Smoker -- 0.25 packs/day for 16 years    Types: Cigarettes  . Smokeless tobacco: Not on file  . Alcohol Use: No    OB History   Grav Para Term Preterm Abortions TAB SAB Ect Mult Living                  Review of Systems  Constitutional: Negative for fever, chills and fatigue.  HENT: Negative for congestion and rhinorrhea.   Respiratory: Negative for chest tightness and shortness of breath.   Cardiovascular: Negative for chest pain.  Gastrointestinal: Negative for nausea, vomiting, abdominal pain and diarrhea.  Genitourinary: Negative for dysuria.  Musculoskeletal: Negative for stiffness.  Neurological: Negative for weakness and numbness.  All other systems reviewed and are  negative.    Allergies  Ibuprofen; Latex; Other; and Aspirin  Home Medications   Current Outpatient Rx  Name  Route  Sig  Dispense  Refill  . albuterol (PROVENTIL HFA;VENTOLIN HFA) 108 (90 BASE) MCG/ACT inhaler   Inhalation   Inhale 2 puffs into the lungs every 6 (six) hours as needed for wheezing or shortness of breath.          . beclomethasone (QVAR) 40 MCG/ACT inhaler   Inhalation   Inhale 1 puff into the lungs 2 (two) times daily.         . diazepam (VALIUM) 5 MG tablet   Oral   Take 2 tablets (10 mg total) by mouth 2 (two) times daily as needed for anxiety.   15 tablet   0   . diphenhydrAMINE (SOMINEX) 25 MG tablet   Oral   Take 50 mg by mouth at bedtime as needed for sleep.          . divalproex (DEPAKOTE) 500 MG DR tablet   Oral   Take 1 tablet (500 mg total) by mouth 3 (three) times daily.   21 tablet   0   . fexofenadine (ALLEGRA) 60 MG tablet   Oral   Take 60 mg by mouth daily.         . fluticasone (FLONASE) 50 MCG/ACT nasal spray  Nasal   Place 1 spray into the nose 2 (two) times daily.         . folic acid (FOLVITE) 1 MG tablet   Oral   Take 1 mg by mouth daily.         Marland Kitchen gabapentin (NEURONTIN) 800 MG tablet   Oral   Take 1 tablet (800 mg total) by mouth 2 (two) times daily.   14 tablet   0   . hydrochlorothiazide (HYDRODIURIL) 25 MG tablet   Oral   Take 25 mg by mouth daily.         Marland Kitchen levETIRAcetam (KEPPRA) 1000 MG tablet   Oral   Take 1 tablet (1,000 mg total) by mouth 3 (three) times daily.   21 tablet   0   . loratadine-pseudoephedrine (CLARITIN-D 24-HOUR) 10-240 MG per 24 hr tablet   Oral   Take 1 tablet by mouth daily.         Marland Kitchen omeprazole (PRILOSEC) 40 MG capsule   Oral   Take 40 mg by mouth daily.         Marland Kitchen oxycodone (OXY-IR) 5 MG capsule   Oral   Take 5 mg by mouth 2 (two) times daily as needed for pain.         . promethazine (PHENERGAN) 25 MG tablet   Oral   Take 1 tablet (25 mg total) by mouth  2 (two) times daily as needed for nausea.   10 tablet   0   . propranolol (INDERAL) 10 MG tablet   Oral   Take 1 tablet (10 mg total) by mouth 2 (two) times daily.   14 tablet   0   . sodium chloride (OCEAN) 0.65 % nasal spray   Nasal   Place 1 spray into the nose 2 (two) times daily as needed for congestion.          Marland Kitchen HYDROcodone-acetaminophen (NORCO/VICODIN) 5-325 MG per tablet   Oral   Take 1 tablet by mouth every 4 (four) hours as needed for pain.   10 tablet   0     BP 112/71  Pulse 98  Temp(Src) 98.7 F (37.1 C) (Oral)  Resp 16  SpO2 100%  LMP 09/16/2012  Physical Exam  Nursing note and vitals reviewed. Constitutional: She is oriented to person, place, and time. She appears well-developed and well-nourished. No distress.  HENT:  Head: Normocephalic and atraumatic.  Mouth/Throat: Oropharynx is clear and moist.  Eyes: EOM are normal. Pupils are equal, round, and reactive to light.  Neck: Normal range of motion. Neck supple.  Cardiovascular: Normal rate, regular rhythm, normal heart sounds and intact distal pulses.  Exam reveals no friction rub.   No murmur heard. Pulmonary/Chest: Effort normal and breath sounds normal. No respiratory distress. She has no wheezes. She has no rales.  Abdominal: Soft. There is no tenderness. There is no rebound and no guarding.  Musculoskeletal: Normal range of motion. She exhibits tenderness. She exhibits no edema.  Small area of localized tenderness on left medial anterior leg with slight swelling and slight erythema. No warmth, induration or fluctuance. No drainage. No TTP of popliteal fossa or calf.   Lymphadenopathy:    She has no cervical adenopathy.  Neurological: She is alert and oriented to person, place, and time.  Skin: Skin is warm and dry. No rash noted.  Psychiatric: She has a normal mood and affect. Her behavior is normal.    ED Course  Procedures (including critical care  time)  Labs Reviewed - No data to  display No results found.   1. Thrombophlebitis   2. Leg pain, anterior, left       MDM  52:44 PM 37 year old female with a history of seizure disorder presenting with 2 days of left anterior leg pain. Patient has been seen multiple times at [facility] with recurrent seizure this past week. She was seen 2 days ago with a seizure. This time she states IV was placed in her left leg, and since she left she is having pain there. She has a localized area of tenderness to her left medial anterior leg with very slight swelling. No pain in the popliteal fossa or her calf. She has no chest pain or shortness of breath. Vitals stable. Not tachycardic on my exam. She has not taken anything for the pain. Overall picture is consistent with superficial thrombophlebitis. No trauma, and do not feel imaging is necessary. Not c/w DVT or cellulitis. Will discharge with a few norco for pain. Pt voiced understanding and dc'd home in stable condition.         Caren Hazy, MD 10/01/12 2126

## 2012-10-04 NOTE — ED Provider Notes (Signed)
I saw and evaluated the patient, reviewed the resident's note and I agree with the findings and plan.  Patient seen for pain around the area of a previous IV sites. Examination not consistent with DVT. No signs of cellulitis. Patient treated with thrombophlebitis.  Gilda Crease, MD 10/04/12 1530

## 2012-10-05 ENCOUNTER — Emergency Department (HOSPITAL_COMMUNITY)
Admission: EM | Admit: 2012-10-05 | Discharge: 2012-10-05 | Discharge status: Against Medical Advice | Payer: Medicaid Other | Attending: Emergency Medicine | Admitting: Emergency Medicine

## 2012-10-05 ENCOUNTER — Encounter (HOSPITAL_COMMUNITY): Payer: Self-pay | Admitting: *Deleted

## 2012-10-05 DIAGNOSIS — K219 Gastro-esophageal reflux disease without esophagitis: Secondary | ICD-10-CM | POA: Insufficient documentation

## 2012-10-05 DIAGNOSIS — Z8541 Personal history of malignant neoplasm of cervix uteri: Secondary | ICD-10-CM | POA: Insufficient documentation

## 2012-10-05 DIAGNOSIS — IMO0001 Reserved for inherently not codable concepts without codable children: Secondary | ICD-10-CM | POA: Insufficient documentation

## 2012-10-05 DIAGNOSIS — Z8709 Personal history of other diseases of the respiratory system: Secondary | ICD-10-CM | POA: Insufficient documentation

## 2012-10-05 DIAGNOSIS — I80292 Phlebitis and thrombophlebitis of other deep vessels of left lower extremity: Secondary | ICD-10-CM

## 2012-10-05 DIAGNOSIS — G40909 Epilepsy, unspecified, not intractable, without status epilepticus: Secondary | ICD-10-CM | POA: Insufficient documentation

## 2012-10-05 DIAGNOSIS — J45909 Unspecified asthma, uncomplicated: Secondary | ICD-10-CM | POA: Insufficient documentation

## 2012-10-05 DIAGNOSIS — Z91040 Latex allergy status: Secondary | ICD-10-CM | POA: Insufficient documentation

## 2012-10-05 DIAGNOSIS — Z8669 Personal history of other diseases of the nervous system and sense organs: Secondary | ICD-10-CM | POA: Insufficient documentation

## 2012-10-05 DIAGNOSIS — F17200 Nicotine dependence, unspecified, uncomplicated: Secondary | ICD-10-CM | POA: Insufficient documentation

## 2012-10-05 DIAGNOSIS — R269 Unspecified abnormalities of gait and mobility: Secondary | ICD-10-CM | POA: Insufficient documentation

## 2012-10-05 DIAGNOSIS — I803 Phlebitis and thrombophlebitis of lower extremities, unspecified: Secondary | ICD-10-CM | POA: Insufficient documentation

## 2012-10-05 DIAGNOSIS — I10 Essential (primary) hypertension: Secondary | ICD-10-CM | POA: Insufficient documentation

## 2012-10-05 DIAGNOSIS — Z9089 Acquired absence of other organs: Secondary | ICD-10-CM | POA: Insufficient documentation

## 2012-10-05 DIAGNOSIS — Z8719 Personal history of other diseases of the digestive system: Secondary | ICD-10-CM | POA: Insufficient documentation

## 2012-10-05 LAB — CBC WITH DIFFERENTIAL/PLATELET
Basophils Absolute: 0 10*3/uL (ref 0.0–0.1)
Eosinophils Relative: 0 % (ref 0–5)
HCT: 39 % (ref 36.0–46.0)
Lymphocytes Relative: 44 % (ref 12–46)
MCHC: 34.6 g/dL (ref 30.0–36.0)
MCV: 92.4 fL (ref 78.0–100.0)
Monocytes Absolute: 0.5 10*3/uL (ref 0.1–1.0)
RDW: 13.1 % (ref 11.5–15.5)
WBC: 5 10*3/uL (ref 4.0–10.5)

## 2012-10-05 LAB — BASIC METABOLIC PANEL
BUN: 12 mg/dL (ref 6–23)
CO2: 29 mEq/L (ref 19–32)
Chloride: 102 mEq/L (ref 96–112)
Creatinine, Ser: 0.71 mg/dL (ref 0.50–1.10)
Glucose, Bld: 112 mg/dL — ABNORMAL HIGH (ref 70–99)

## 2012-10-05 MED ORDER — INDOMETHACIN 25 MG PO CAPS: 25.0000 mg | ORAL_CAPSULE | Freq: Two times a day (BID) | ORAL | Status: DC

## 2012-10-05 MED ORDER — PIPERACILLIN-TAZOBACTAM 3.375 G IVPB: 3.3750 g | Freq: Once | INTRAVENOUS | Status: DC

## 2012-10-05 MED ORDER — VANCOMYCIN HCL 10 G IV SOLR
1500.0000 mg | Freq: Once | INTRAVENOUS | Status: DC
  Filled 2012-10-05: qty 1500

## 2012-10-05 MED ORDER — MORPHINE SULFATE 4 MG/ML IJ SOLN: 4.0000 mg | Freq: Once | INTRAMUSCULAR | Status: DC

## 2012-10-05 NOTE — ED Notes (Signed)
MD plan is to admit pt but pt states she has no one to care for her children and she plans to go home.  Refused IV start for admission.  Denny Peon, PA notified of this conversation and will speak with Dr Effie Shy

## 2012-10-05 NOTE — ED Provider Notes (Signed)
History    CSN: [id number] Arrival date & time 10/05/12  1719  First MD Initiated Contact with Patient 10/05/12 2058     Chief Complaint  Patient presents with  . Leg Swelling   (Consider location/radiation/quality/duration/timing/severity/associated sxs/prior Treatment) HPI Pt is a 37yo female recently diagnosed with thrombophlebitis of left leg on 6/19 after initially receiving dilantin through IV in her left foot on 6/16 for a seizure.  Vein was infiltrated and started current symtpoms of constant aching throbbing, burning pain, 8/10, associated with redness, swelling, and warmth of her left lower leg.  Pt was seen on 6/19 for same, was given Vicodin, however states this has not helped with pain. Symptoms have not improve.  Denies fever, n/v/d.    Past Medical History  Diagnosis Date  . 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 Seizures     due to head trauma as adult  . Cervical cancer     2013, untreated  . Asthma     as child  . Meningioma   . Gallstones     s/p cholecystectomy  . HTN (hypertension)   . GERD (gastroesophageal reflux disease)   . Allergic rhinitis   . Cigarette nicotine dependence with withdrawal    Past Surgical History  Procedure Laterality Date  . Brain surgery      2013 to remove a meningioma  . Cholecystectomy     Family History  Problem Relation Age of Onset  . Other Mother     varicose vein  . Deep vein thrombosis Neg Hx   . Pulmonary embolism Neg Hx   . Cancer    . Diabetes    . High blood pressure    . Asthma    . Thyroid disease     History  Substance Use Topics  . Smoking status: Current Every Day Smoker -- 0.25 packs/day for 16 years    Types: Cigarettes  . Smokeless tobacco: Not on file  . Alcohol Use: No   OB History   Grav Para Term Preterm Abortions TAB SAB Ect Mult Living                 Review of Systems  Constitutional: Negative for fever, chills, diaphoresis, appetite change and fatigue.  Musculoskeletal:  Positive for myalgias ( left lower leg) and gait problem ( increased pain).  Skin: Positive for color change ( redness). Negative for wound.  All other systems reviewed and are negative.    Allergies  Ibuprofen; Clindamycin/lincomycin; Latex; Other; and Aspirin  Home Medications   No current outpatient prescriptions on file. BP 118/66  Pulse 82  Temp(Src) 98.7 F (37.1 C) (Oral)  Resp 20  SpO2 98%  LMP 09/16/2012 Physical Exam  Nursing note and vitals reviewed. Constitutional: She appears well-developed and well-nourished. No distress.  Pt lying comfortably in exam bed. NAD.   HENT:  Head: Normocephalic and atraumatic.  Eyes: Conjunctivae are normal. No scleral icterus.  Neck: Normal range of motion. Neck supple.  Cardiovascular: Normal rate, regular rhythm and normal heart sounds.   Pulmonary/Chest: Effort normal and breath sounds normal. No respiratory distress. She has no wheezes. She has no rales. She exhibits no tenderness.  Musculoskeletal: Normal range of motion. She exhibits edema ( left lower leg) and tenderness ( left lower leg, severe ).       Left lower leg: She exhibits tenderness, bony tenderness, swelling and edema. She exhibits no deformity and no laceration.  Legs: Exquisite left lower leg tenderness with erythema, warmth, and moderate swelling.     Neurological: She is alert.  Skin: Skin is warm and dry. She is not diaphoretic. There is erythema.  Psychiatric: She has a normal mood and affect. Her behavior is normal.    ED Course  Procedures (including critical care time) Labs Reviewed  BASIC METABOLIC PANEL - Abnormal; Notable for the following:    Potassium 3.3 (*)    Glucose, Bld 112 (*)    All other components within normal limits  CBC WITH DIFFERENTIAL   Ct Angio Chest Pe W/cm &/or Wo Cm  10/06/2012   *RADIOLOGY REPORT*  Clinical Data: Chest pain.  Left leg pain.  Pulmonary embolism. Deep venous thrombosis.  CT ANGIOGRAPHY CHEST  Technique:   Multidetector CT imaging of the chest using the standard protocol during bolus administration of intravenous contrast. Multiplanar reconstructed images including MIPs were obtained and reviewed to evaluate the vascular anatomy.  Contrast: OMNIPAQUE IOHEXOL 350 MG/ML SOLN  Comparison: None.  Findings: Study is mildly technically degraded by bolus dispersion and body habitus.  No pulmonary embolus is identified.  Heart grossly appears normal.  Incidental imaging of the upper abdomen is within normal limits status post cholecystectomy.  There is no airspace disease.  No pleural effusion.  5 mm triangular nodules present in the right upper lobe (image number 16 series 6). Faint subpleural ground-glass attenuation nodule in the right middle lobe ( image number 49).  Respiratory motion step-off deformity is present in the sternum on the sagittal reconstructions.  Greater than expected thymic tissue is present in the anterior mediastinum.  Additionally, small bilateral enlarged axillary lymph nodes are present.  Largest left axillary lymph node measures 11 mm short axis on axial imaging.  No aggressive osseous lesions.  IMPRESSION: 1.  Although the study is mildly technically degraded, no pulmonary embolus is identified.  No acute aortic abnormality. 2.  Mildly enlarged left axillary lymph node, without cause identified.  This can be associated with left upper extremity inflammatory process but is nonspecific.  No mediastinal adenopathy. 3.  Prominent anterior mediastinal thymic tissue, greater than expected although there is no discrete mass lesion, likely hyperplasia. 4.  5 mm right upper lobe triangular pulmonary nodule. Although the risk is low, because the patient has a history of malignancy, 12- month noncontrast chest CT follow-up recommended to assess for stability.   Original Report Authenticated By: Andreas Newport, M.D.   1. Thrombophlebitis of left leg     MDM  Pt returned for evaluation of left  lower leg pain, swelling and redness. Discussed case with Dr. Effie Shy who believes pt needs to be admitted for further testing and possibly MRI of left lower leg, with treatment for symptoms.    Discussed with pt importance of being admitted at this time for further evaluation and tx.  Pt understands risks of leaving AMA and benefits of staying for admittance to hospital tonight however states she recently moved to [place] from [place] and does not have anyone to come take care of her children.  States her mother is coming up from [facility] and will be here by 9am tomorrow morning, 6/24.  Pt states she promises to come back in the morning to be admitted to the ED for further evaluation and treatment.    Junius Finner, PA-C 10/07/12 0101

## 2012-10-05 NOTE — ED Notes (Signed)
Pt returns for followup of LLE.  States she was here last week .  Originally went to [facility] s/p seizure where she was given  IV Dilantin which apparently  Infiltrated into her LLE.  LLE  Is swollen, red from ankle to knee,painful to touch rated 10/10 not relieved by hydrocodone.  Palpaple pedal pulses bilat

## 2012-10-05 NOTE — ED Notes (Signed)
Pt had IV put in leg last week and pumped medicine in it.  Pt is here with redness, swelling, and warmth to left leg.  Pulse present

## 2012-10-06 ENCOUNTER — Emergency Department (HOSPITAL_COMMUNITY): Payer: No Typology Code available for payment source

## 2012-10-06 ENCOUNTER — Encounter (HOSPITAL_COMMUNITY): Payer: Self-pay | Admitting: Emergency Medicine

## 2012-10-06 ENCOUNTER — Observation Stay (HOSPITAL_COMMUNITY)
Admission: EM | Admit: 2012-10-06 | Discharge: 2012-10-07 | Disposition: A | Discharge status: Home / Self Care | Payer: No Typology Code available for payment source | Attending: Internal Medicine | Admitting: Internal Medicine

## 2012-10-06 DIAGNOSIS — I82439 Acute embolism and thrombosis of unspecified popliteal vein: Secondary | ICD-10-CM

## 2012-10-06 DIAGNOSIS — M79609 Pain in unspecified limb: Secondary | ICD-10-CM

## 2012-10-06 DIAGNOSIS — F17213 Nicotine dependence, cigarettes, with withdrawal: Secondary | ICD-10-CM

## 2012-10-06 DIAGNOSIS — M7989 Other specified soft tissue disorders: Secondary | ICD-10-CM

## 2012-10-06 DIAGNOSIS — R911 Solitary pulmonary nodule: Secondary | ICD-10-CM

## 2012-10-06 DIAGNOSIS — I82402 Acute embolism and thrombosis of unspecified deep veins of left lower extremity: Secondary | ICD-10-CM

## 2012-10-06 DIAGNOSIS — L03116 Cellulitis of left lower limb: Secondary | ICD-10-CM

## 2012-10-06 DIAGNOSIS — I809 Phlebitis and thrombophlebitis of unspecified site: Secondary | ICD-10-CM

## 2012-10-06 DIAGNOSIS — J4520 Mild intermittent asthma, uncomplicated: Secondary | ICD-10-CM

## 2012-10-06 DIAGNOSIS — I82432 Acute embolism and thrombosis of left popliteal vein: Secondary | ICD-10-CM

## 2012-10-06 DIAGNOSIS — K219 Gastro-esophageal reflux disease without esophagitis: Secondary | ICD-10-CM

## 2012-10-06 DIAGNOSIS — J45909 Unspecified asthma, uncomplicated: Secondary | ICD-10-CM

## 2012-10-06 DIAGNOSIS — D496 Neoplasm of unspecified behavior of brain: Secondary | ICD-10-CM | POA: Insufficient documentation

## 2012-10-06 DIAGNOSIS — G40309 Generalized idiopathic epilepsy and epileptic syndromes, not intractable, without status epilepticus: Secondary | ICD-10-CM

## 2012-10-06 DIAGNOSIS — I824Y9 Acute embolism and thrombosis of unspecified deep veins of unspecified proximal lower extremity: Principal | ICD-10-CM

## 2012-10-06 DIAGNOSIS — G40409 Other generalized epilepsy and epileptic syndromes, not intractable, without status epilepticus: Secondary | ICD-10-CM | POA: Diagnosis present

## 2012-10-06 DIAGNOSIS — I10 Essential (primary) hypertension: Secondary | ICD-10-CM

## 2012-10-06 HISTORY — DX: Allergic rhinitis, unspecified: J30.9

## 2012-10-06 HISTORY — DX: Essential (primary) hypertension: I10

## 2012-10-06 HISTORY — DX: Gastro-esophageal reflux disease without esophagitis: K21.9

## 2012-10-06 HISTORY — DX: Nicotine dependence, cigarettes, with withdrawal: F17.213

## 2012-10-06 HISTORY — DX: Calculus of gallbladder without cholecystitis without obstruction: K80.20

## 2012-10-06 HISTORY — DX: Benign neoplasm of meninges, unspecified: D32.9

## 2012-10-06 LAB — BASIC METABOLIC PANEL
Calcium: 8.9 mg/dL (ref 8.4–10.5)
Creatinine, Ser: 0.76 mg/dL (ref 0.50–1.10)
GFR calc Af Amer: >90 mL/min (ref >90)
GFR calc non Af Amer: >90 mL/min (ref >90)

## 2012-10-06 LAB — CBC WITH DIFFERENTIAL/PLATELET
Basophils Absolute: 0 10*3/uL (ref 0.0–0.1)
Basophils Relative: 1 % (ref 0–1)
Eosinophils Absolute: 0 10*3/uL (ref 0.0–0.7)
Eosinophils Relative: 1 % (ref 0–5)
HCT: 37 % (ref 36.0–46.0)
MCHC: 34.3 g/dL (ref 30.0–36.0)
MCV: 92 fL (ref 78.0–100.0)
Monocytes Absolute: 0.5 10*3/uL (ref 0.1–1.0)
Neutro Abs: 2.3 10*3/uL (ref 1.7–7.7)
RDW: 13.2 % (ref 11.5–15.5)

## 2012-10-06 MED ORDER — PSEUDOEPHEDRINE HCL ER 120 MG PO TB12
120.0000 mg | ORAL_TABLET | Freq: Two times a day (BID) | ORAL | Status: DC
  Administered 2012-10-07: 120 mg via ORAL
  Filled 2012-10-06 (×4): qty 1

## 2012-10-06 MED ORDER — ONDANSETRON HCL 4 MG/2ML IJ SOLN: 4.0000 mg | Freq: Three times a day (TID) | INTRAMUSCULAR | Status: AC | PRN

## 2012-10-06 MED ORDER — ONDANSETRON HCL 4 MG/2ML IJ SOLN: 4.0000 mg | Freq: Once | INTRAMUSCULAR | Status: DC

## 2012-10-06 MED ORDER — DOCUSATE SODIUM 100 MG PO CAPS
100.0000 mg | ORAL_CAPSULE | Freq: Two times a day (BID) | ORAL | Status: DC
  Administered 2012-10-06 – 2012-10-07 (×2): 100 mg via ORAL
  Filled 2012-10-06 (×3): qty 1

## 2012-10-06 MED ORDER — ENOXAPARIN SODIUM 100 MG/ML ~~LOC~~ SOLN
100.0000 mg | Freq: Two times a day (BID) | SUBCUTANEOUS | Status: DC
  Administered 2012-10-06: 100 mg via SUBCUTANEOUS
  Filled 2012-10-06 (×4): qty 1

## 2012-10-06 MED ORDER — ONDANSETRON HCL 4 MG PO TABS: 4.0000 mg | ORAL_TABLET | Freq: Four times a day (QID) | ORAL | Status: DC | PRN

## 2012-10-06 MED ORDER — HYDROCODONE-ACETAMINOPHEN 5-325 MG PO TABS
1.0000 | ORAL_TABLET | ORAL | Status: DC | PRN
  Administered 2012-10-07 (×2): 2 via ORAL
  Filled 2012-10-06 (×2): qty 2

## 2012-10-06 MED ORDER — LORAZEPAM 2 MG/ML IJ SOLN: 2.0000 mg | INTRAMUSCULAR | Status: DC | PRN

## 2012-10-06 MED ORDER — ONDANSETRON HCL 4 MG/2ML IJ SOLN: 4.0000 mg | Freq: Three times a day (TID) | INTRAMUSCULAR | Status: DC | PRN

## 2012-10-06 MED ORDER — ACETAMINOPHEN 325 MG PO TABS: 650.0000 mg | ORAL_TABLET | Freq: Four times a day (QID) | ORAL | Status: DC | PRN

## 2012-10-06 MED ORDER — HYDROMORPHONE HCL PF 1 MG/ML IJ SOLN
1.0000 mg | Freq: Once | INTRAMUSCULAR | Status: AC
  Administered 2012-10-06: 1 mg via INTRAVENOUS
  Filled 2012-10-06: qty 1

## 2012-10-06 MED ORDER — ONDANSETRON HCL 4 MG/2ML IJ SOLN: 4.0000 mg | Freq: Four times a day (QID) | INTRAMUSCULAR | Status: DC | PRN

## 2012-10-06 MED ORDER — FLUTICASONE PROPIONATE 50 MCG/ACT NA SUSP
1.0000 | Freq: Two times a day (BID) | NASAL | Status: DC
  Administered 2012-10-07: 1 via NASAL
  Filled 2012-10-06: qty 16

## 2012-10-06 MED ORDER — IOHEXOL 350 MG/ML SOLN
100.0000 mL | Freq: Once | INTRAVENOUS | Status: AC | PRN
  Administered 2012-10-06: 100 mL via INTRAVENOUS

## 2012-10-06 MED ORDER — DIAZEPAM 5 MG PO TABS: 10.0000 mg | ORAL_TABLET | Freq: Two times a day (BID) | ORAL | Status: DC | PRN

## 2012-10-06 MED ORDER — CLINDAMYCIN PHOSPHATE 900 MG/50ML IV SOLN
900.0000 mg | Freq: Once | INTRAVENOUS | Status: AC
  Administered 2012-10-06: 900 mg via INTRAVENOUS
  Filled 2012-10-06: qty 50

## 2012-10-06 MED ORDER — PANTOPRAZOLE SODIUM 40 MG PO TBEC
80.0000 mg | DELAYED_RELEASE_TABLET | Freq: Every day | ORAL | Status: DC
  Administered 2012-10-06 – 2012-10-07 (×2): 80 mg via ORAL
  Filled 2012-10-06 (×2): qty 2

## 2012-10-06 MED ORDER — PROMETHAZINE HCL 25 MG PO TABS: 25.0000 mg | ORAL_TABLET | Freq: Two times a day (BID) | ORAL | Status: DC | PRN

## 2012-10-06 MED ORDER — DIPHENHYDRAMINE HCL 25 MG PO CAPS
50.0000 mg | ORAL_CAPSULE | Freq: Every evening | ORAL | Status: DC | PRN
  Administered 2012-10-06: 50 mg via ORAL
  Filled 2012-10-06: qty 2

## 2012-10-06 MED ORDER — FLUTICASONE PROPIONATE HFA 44 MCG/ACT IN AERO
1.0000 | INHALATION_SPRAY | Freq: Two times a day (BID) | RESPIRATORY_TRACT | Status: DC
  Filled 2012-10-06: qty 10.6

## 2012-10-06 MED ORDER — HYDROMORPHONE HCL PF 1 MG/ML IJ SOLN: 1.0000 mg | INTRAMUSCULAR | Status: DC | PRN

## 2012-10-06 MED ORDER — DIPHENHYDRAMINE HCL 50 MG/ML IJ SOLN
25.0000 mg | Freq: Once | INTRAMUSCULAR | Status: AC
  Administered 2012-10-06: 25 mg via INTRAVENOUS
  Filled 2012-10-06: qty 1

## 2012-10-06 MED ORDER — DIVALPROEX SODIUM 500 MG PO DR TAB
500.0000 mg | DELAYED_RELEASE_TABLET | Freq: Three times a day (TID) | ORAL | Status: DC
  Administered 2012-10-06 – 2012-10-07 (×2): 500 mg via ORAL
  Filled 2012-10-06 (×4): qty 1

## 2012-10-06 MED ORDER — POLYETHYLENE GLYCOL 3350 17 G PO PACK
17.0000 g | PACK | Freq: Every day | ORAL | Status: DC | PRN
  Filled 2012-10-06: qty 1

## 2012-10-06 MED ORDER — GABAPENTIN 400 MG PO CAPS
800.0000 mg | ORAL_CAPSULE | Freq: Two times a day (BID) | ORAL | Status: DC
  Administered 2012-10-06 – 2012-10-07 (×2): 800 mg via ORAL
  Filled 2012-10-06 (×3): qty 2

## 2012-10-06 MED ORDER — ACETAMINOPHEN 650 MG RE SUPP: 650.0000 mg | Freq: Four times a day (QID) | RECTAL | Status: DC | PRN

## 2012-10-06 MED ORDER — ALBUTEROL SULFATE HFA 108 (90 BASE) MCG/ACT IN AERS
2.0000 | INHALATION_SPRAY | Freq: Four times a day (QID) | RESPIRATORY_TRACT | Status: DC | PRN
  Filled 2012-10-06: qty 6.7

## 2012-10-06 MED ORDER — SALINE NASAL SPRAY 0.65 % NA SOLN: 1.0000 | Freq: Two times a day (BID) | NASAL | Status: DC | PRN

## 2012-10-06 MED ORDER — HYDROMORPHONE HCL PF 1 MG/ML IJ SOLN
1.0000 mg | INTRAMUSCULAR | Status: AC | PRN
  Administered 2012-10-06 – 2012-10-07 (×2): 1 mg via INTRAVENOUS
  Filled 2012-10-06 (×2): qty 1

## 2012-10-06 MED ORDER — LORATADINE-PSEUDOEPHEDRINE ER 10-240 MG PO TB24: 1.0000 | ORAL_TABLET | Freq: Every day | ORAL | Status: DC

## 2012-10-06 MED ORDER — HYDROMORPHONE HCL PF 1 MG/ML IJ SOLN
1.0000 mg | Freq: Once | INTRAMUSCULAR | Status: AC
  Administered 2012-10-06: 1 mg via INTRAMUSCULAR
  Filled 2012-10-06: qty 1

## 2012-10-06 MED ORDER — PROPRANOLOL HCL 10 MG PO TABS
10.0000 mg | ORAL_TABLET | Freq: Two times a day (BID) | ORAL | Status: DC
Start: 2012-10-06 — End: 2012-10-07
  Administered 2012-10-06 – 2012-10-07 (×2): 10 mg via ORAL
  Filled 2012-10-06 (×3): qty 1

## 2012-10-06 MED ORDER — LORATADINE 10 MG PO TABS
10.0000 mg | ORAL_TABLET | Freq: Every day | ORAL | Status: DC
  Administered 2012-10-06 – 2012-10-07 (×2): 10 mg via ORAL
  Filled 2012-10-06 (×2): qty 1

## 2012-10-06 MED ORDER — NICOTINE 7 MG/24HR TD PT24
7.0000 mg | MEDICATED_PATCH | Freq: Every day | TRANSDERMAL | Status: DC
  Administered 2012-10-06 – 2012-10-07 (×2): 7 mg via TRANSDERMAL
  Filled 2012-10-06 (×2): qty 1

## 2012-10-06 MED ORDER — FOLIC ACID 1 MG PO TABS
1.0000 mg | ORAL_TABLET | Freq: Every day | ORAL | Status: DC
Start: 2012-10-06 — End: 2012-10-07
  Administered 2012-10-06 – 2012-10-07 (×2): 1 mg via ORAL
  Filled 2012-10-06 (×2): qty 1

## 2012-10-06 MED ORDER — ENOXAPARIN SODIUM 100 MG/ML ~~LOC~~ SOLN
100.0000 mg | Freq: Once | SUBCUTANEOUS | Status: AC
  Administered 2012-10-06: 100 mg via SUBCUTANEOUS
  Filled 2012-10-06: qty 1

## 2012-10-06 MED ORDER — HYDROMORPHONE HCL PF 1 MG/ML IJ SOLN: 1.0000 mg | Freq: Once | INTRAMUSCULAR | Status: DC

## 2012-10-06 MED ORDER — DIPHENHYDRAMINE HCL (SLEEP) 25 MG PO TABS: 50.0000 mg | ORAL_TABLET | Freq: Every evening | ORAL | Status: DC | PRN

## 2012-10-06 MED ORDER — GABAPENTIN 800 MG PO TABS
800.0000 mg | ORAL_TABLET | Freq: Two times a day (BID) | ORAL | Status: DC
  Filled 2012-10-06: qty 1

## 2012-10-06 MED ORDER — SALINE SPRAY 0.65 % NA SOLN
1.0000 | Freq: Two times a day (BID) | NASAL | Status: DC | PRN
  Filled 2012-10-06: qty 44

## 2012-10-06 MED ORDER — LEVETIRACETAM 500 MG PO TABS
1000.0000 mg | ORAL_TABLET | Freq: Three times a day (TID) | ORAL | Status: DC
  Administered 2012-10-06 – 2012-10-07 (×2): 1000 mg via ORAL
  Filled 2012-10-06 (×4): qty 2

## 2012-10-06 NOTE — ED Notes (Signed)
Attempted report X1

## 2012-10-06 NOTE — ED Notes (Signed)
Spoke with IV team will attempt after 2 attempts by RN.

## 2012-10-06 NOTE — Progress Notes (Signed)
Patient arrived to unit with Theodoro Grist her significant other. Patient oriented to unit and made comfortable. Patient alert and oriented and was oriented to unit.  Left leg pain with redness and edema present

## 2012-10-06 NOTE — H&P (Addendum)
[facility] History and Physical  Lori Ford ZOX:[id number] DOB: Apr 29, 1975 DOA: 10/06/2012  Referring physician:  Renne Crigler PCP:  No PCP Per Patient   Chief Complaint:  Left lower extremity pain  HPI:  The patient is a 37 y.o. year-old female with history of possible cervical cancer, asthma, meningioma status post excision, and grand mal seizures attributed to head trauma who presents with left lower extremity redness, swelling, and pain.  The patient was last at their baseline health prior to June 12.   The patient presented on June 12th to the ER and had a grand mal seizure.  She left AGAINST MEDICAL ADVICE the next day.  She represented on the 14th in status epilepticus.  She was loaded with an antiepileptic and discharged home but returned again later that day and then again on the 16th with the same complaint.  During one of these episodes of seizure, she had a peripheral IV placed in her left lower extremity. It infiltrated at the time that medication was infusing.  She represented to the emergency department on the 19th with left lower extremity pain and was diagnosed with thrombophlebitis. She was given a prescription for Vicodin for pain and discharged. She represents today with worsening 10/10 pain and redness and swelling of the left lower extremity.  Duplex in the emergency department demonstrates acute DVT of the popliteal vein of the left leg as well as superficial thrombophlebitis of the left greater saphenous vein from the calf to above the knee.  She was thought to have a combination of DVT and cellulitis causing her pain. She received a dose of clindamycin in the ER and after which she developed itching. She reported that she forgot to mention that she was allergic to clindamycin during this and all of her other previous emergency department visits in the last couple of weeks.  She was given a shot of Lovenox in the emergency department and will be admitted under observation  to establish primary care for ongoing monitoring of anticoagulation and to determine anticoagulation options.  He denies recent stasis. She is unaware she has any underlying malignancies and has not had establish primary care in this area. She's not had a personal history of DVT or pulmonary embolism.  Review of Systems:  Denies fevers, chills, weight loss or gain, changes to hearing and vision.  Denies rhinorrhea, sinus congestion, sore throat.  Denies chest pain and palpitations.  Denies SOB, wheezing, cough.  Chronic nausea, vomiting.  Denies constipation, diarrhea.  Denies dysuria, frequency, urgency, polyuria, polydipsia.  Denies hematemesis, blood in stools, melena, abnormal bruising or bleeding.  Denies lymphadenopathy.  Denies arthralgias, myalgias.  Denies focal numbness, weakness, slurred speech, confusion, facial droop.  Denies anxiety and depression.    Past Medical History  Diagnosis Date  . 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 Seizures     due to head trauma as adult  . Cervical cancer     2013, untreated  . Asthma     as child  . Meningioma   . Gallstones     s/p cholecystectomy  . HTN (hypertension)   . GERD (gastroesophageal reflux disease)   . Allergic rhinitis   . Cigarette nicotine dependence with withdrawal    Past Surgical History  Procedure Laterality Date  . Brain surgery      2013 to remove a meningioma  . Cholecystectomy     Social History:  reports that she has been smoking Cigarettes.  She has a 4 pack-year smoking history. She does not have any smokeless tobacco history on file. She reports that she uses illicit drugs (Marijuana). She reports that she does not drink alcohol. Lives with fiance.  Does not have a PCP.  [address] Dr. Mayford Knife, walk in.  Has insurance.  [facility] [address].    Allergies  Allergen Reactions  . Ibuprofen Shortness Of Breath    wheezing  . Clindamycin/Lincomycin Hives  . Latex Other (See Comments)    Rash,  wheezing  . Other Swelling    "GRAPES"  . Aspirin Palpitations    Family History  Problem Relation Age of Onset  . Other Mother     varicose vein  . Deep vein thrombosis Neg Hx   . Pulmonary embolism Neg Hx   . Cancer    . Diabetes    . High blood pressure    . Asthma    . Thyroid disease       Prior to Admission medications   Medication Sig Start Date End Date Taking? Authorizing Provider  albuterol (PROVENTIL HFA;VENTOLIN HFA) 108 (90 BASE) MCG/ACT inhaler Inhale 2 puffs into the lungs every 6 (six) hours as needed for wheezing or shortness of breath.    Yes Historical Provider, MD  beclomethasone (QVAR) 40 MCG/ACT inhaler Inhale 1 puff into the lungs 2 (two) times daily.   Yes Historical Provider, MD  diazepam (VALIUM) 5 MG tablet Take 2 tablets (10 mg total) by mouth 2 (two) times daily as needed for anxiety. 09/26/12  Yes Gerhard Munch, MD  diphenhydrAMINE (SOMINEX) 25 MG tablet Take 50 mg by mouth at bedtime as needed for sleep.    Yes Historical Provider, MD  divalproex (DEPAKOTE) 500 MG DR tablet Take 1 tablet (500 mg total) by mouth 3 (three) times daily. 09/26/12  Yes Gerhard Munch, MD  fexofenadine (ALLEGRA) 60 MG tablet Take 60 mg by mouth daily.   Yes Historical Provider, MD  fluticasone (FLONASE) 50 MCG/ACT nasal spray Place 1 spray into the nose 2 (two) times daily.   Yes Historical Provider, MD  folic acid (FOLVITE) 1 MG tablet Take 1 mg by mouth daily.   Yes Historical Provider, MD  gabapentin (NEURONTIN) 800 MG tablet Take 1 tablet (800 mg total) by mouth 2 (two) times daily. 09/26/12  Yes Gerhard Munch, MD  hydrochlorothiazide (HYDRODIURIL) 25 MG tablet Take 25 mg by mouth daily.   Yes Historical Provider, MD  HYDROcodone-acetaminophen (NORCO/VICODIN) 5-325 MG per tablet Take 1 tablet by mouth every 4 (four) hours as needed for pain. 10/01/12  Yes Caren Hazy, MD  levETIRAcetam (KEPPRA) 1000 MG tablet Take 1 tablet (1,000 mg total) by mouth 3 (three) times daily.  09/26/12  Yes Gerhard Munch, MD  loratadine-pseudoephedrine (CLARITIN-D 24-HOUR) 10-240 MG per 24 hr tablet Take 1 tablet by mouth daily.   Yes Historical Provider, MD  omeprazole (PRILOSEC) 40 MG capsule Take 40 mg by mouth daily.   Yes Historical Provider, MD  promethazine (PHENERGAN) 25 MG tablet Take 1 tablet (25 mg total) by mouth 2 (two) times daily as needed for nausea. 09/26/12  Yes Gerhard Munch, MD  propranolol (INDERAL) 10 MG tablet Take 1 tablet (10 mg total) by mouth 2 (two) times daily. 09/26/12  Yes Gerhard Munch, MD  sodium chloride (OCEAN) 0.65 % nasal spray Place 1 spray into the nose 2 (two) times daily as needed for congestion.    Yes Historical Provider, MD   Physical Exam: Filed Vitals:  10/06/12 1132 10/06/12 1430 10/06/12 1445 10/06/12 1530  BP: 106/69 112/69 119/70 105/78  Pulse: 88 74 69 78  Temp: 98.9 F (37.2 C)     TempSrc: Oral     Resp: 12 16 16 16   SpO2: 99% 100% 100% 100%     General:  African American single, no acute distress  Eyes:  PERRL, anicteric, non-injected.  ENT:  Nares clear.  OP clear, non-erythematous without plaques or exudates.  MMM.  Neck:  Supple without TM or JVD.    Lymph:  No cervical, supraclavicular, or submandibular LAD.  Cardiovascular:  RRR, normal S1, S2, without m/r/g.  2+ pulses, warm extremities  Respiratory:  CTA bilaterally without increased WOB.  Abdomen:  NABS.  Soft, ND/NT.    Skin:  No rashes or focal lesions.  Musculoskeletal:  Normal bulk and tone.  No LE edema.  Psychiatric:  A & O x 4.  Appropriate affect.  Neurologic:  CN 3-12 intact.  5/5 strength.  Sensation intact.  Labs on Admission:  Basic Metabolic Panel:  Recent Labs Lab 10/05/12 1830 10/06/12 1323  NA 138 139  K 3.3* 3.9  CL 102 102  CO2 29 30  GLUCOSE 112* 97  BUN 12 8  CREATININE 0.71 0.76  CALCIUM 9.3 8.9   Liver Function Tests: No results found for this basename: AST, ALT, ALKPHOS, BILITOT, PROT, ALBUMIN,  in the last  168 hours No results found for this basename: LIPASE, AMYLASE,  in the last 168 hours No results found for this basename: AMMONIA,  in the last 168 hours CBC:  Recent Labs Lab 10/05/12 1830 10/06/12 1323  WBC 5.0 4.9  NEUTROABS 2.3 2.3  HGB 13.5 12.7  HCT 39.0 37.0  MCV 92.4 92.0  PLT 218 214   Cardiac Enzymes: No results found for this basename: CKTOTAL, CKMB, CKMBINDEX, TROPONINI,  in the last 168 hours  BNP (last 3 results) No results found for this basename: PROBNP,  in the last 8760 hours CBG: No results found for this basename: GLUCAP,  in the last 168 hours  Radiological Exams on Admission: Ct Angio Chest Pe W/cm &/or Wo Cm  10/06/2012   *RADIOLOGY REPORT*  Clinical Data: Chest pain.  Left leg pain.  Pulmonary embolism. Deep venous thrombosis.  CT ANGIOGRAPHY CHEST  Technique:  Multidetector CT imaging of the chest using the standard protocol during bolus administration of intravenous contrast. Multiplanar reconstructed images including MIPs were obtained and reviewed to evaluate the vascular anatomy.  Contrast: OMNIPAQUE IOHEXOL 350 MG/ML SOLN  Comparison: None.  Findings: Study is mildly technically degraded by bolus dispersion and body habitus.  No pulmonary embolus is identified.  Heart grossly appears normal.  Incidental imaging of the upper abdomen is within normal limits status post cholecystectomy.  There is no airspace disease.  No pleural effusion.  5 mm triangular nodules present in the right upper lobe (image number 16 series 6). Faint subpleural ground-glass attenuation nodule in the right middle lobe ( image number 49).  Respiratory motion step-off deformity is present in the sternum on the sagittal reconstructions.  Greater than expected thymic tissue is present in the anterior mediastinum.  Additionally, small bilateral enlarged axillary lymph nodes are present.  Largest left axillary lymph node measures 11 mm Reyden Smith axis on axial imaging.  No aggressive osseous  lesions.  IMPRESSION: 1.  Although the study is mildly technically degraded, no pulmonary embolus is identified.  No acute aortic abnormality. 2.  Mildly enlarged left axillary lymph  node, without cause identified.  This can be associated with left upper extremity inflammatory process but is nonspecific.  No mediastinal adenopathy. 3.  Prominent anterior mediastinal thymic tissue, greater than expected although there is no discrete mass lesion, likely hyperplasia. 4.  5 mm right upper lobe triangular pulmonary nodule. Although the risk is low, because the patient has a history of malignancy, 12- month noncontrast chest CT follow-up recommended to assess for stability.   Original Report Authenticated By: Andreas Newport, M.D.    Assessment/Plan Principal Problem:   DVT, popliteal, acute, left Active Problems:   Seizure disorder, grand mal   Brain tumor   5mm Pulmonary nodule, right upper lobe.  repeat CT chest May 2015.   Asthma   Hypertension   GERD (gastroesophageal reflux disease)   Cigarette nicotine dependence with withdrawal  Principal Problem:  DVT, popliteal, acute, left.  May have been incited by her recent peripheral IV and possible extravasation of antiepileptic medication. -  Lovenox shot in the emergency department -  Will send prescription for xarelto to her primary pharmacy to determine how it will cost her per month -  Case management to assist in establishing a primary care doctor -  Once the patient determines whether she wants xarelto versus Coumadin and she has follow up, she can be discharged -  Needs followup with gynecology to ensure that she does not have cervical cancer -  Needs followup with primary care for other malignancy screening and for possible hypercoagulable workup  Patient's erythema seems to be consistent with DVT. At this time we'll not treat her for cellulitis.  She'll need close followup with primary care so that if her symptoms worsen she can be started  on empiric antibiotics.  Seizure disorder, grand mal. States she has daily he grand mal seizures. They last anywhere from 5-10 minutes. Her family or friends administer sublingual diazepam during seizures.   -  Continue AEDs -  When necessary Ativan -  Seizure precautions  HTN, low normal.  Continue propranolol and hold hydrochlorothiazide.  -  If blood pressure rises, restart HCTZ   5mm Pulmonary nodule, right upper lobe.   -  Repeat CT chest May 2015.  Asthma and allergic rhinitis, stable. Continue home medications Cigarette dependence with withdrawal.  Nicotine patch.    Diet:  Healthy heart Access:  PIV IVF:  Off Proph:  Lovenox  Code Status: Full code Family Communication: Spoke with the patient alone Disposition Plan: Tomorrow after decision about Xarelto versus Coumadin   Time spent: 60 min   Tc Kapusta [facility] Pager [phone]  If 7PM-7AM, please contact night-coverage [email or web address] Password [facility] 10/06/2012, 5:30 PM

## 2012-10-06 NOTE — Progress Notes (Signed)
Bilateral lower extremity venous duplex completed.  Right:  No evidence of DVT, superficial thrombosis, or Baker's cyst.  Left: DVT noted in the popliteal vein and superficial thrombus in the greater saphenous vein from the calf to above the knee.  No Baker's cyst.

## 2012-10-06 NOTE — Progress Notes (Signed)
Attempted x 3 to obtain report.

## 2012-10-06 NOTE — ED Notes (Signed)
Seen in ED one day ago for left leg pain and discharged. Pain continued today called EMS in order to be evaluated again today.

## 2012-10-06 NOTE — ED Provider Notes (Signed)
History    CSN: [id number] Arrival date & time 10/06/12  1130  First MD Initiated Contact with Patient 10/06/12 1138     Chief Complaint  Patient presents with  . Leg Pain   (Consider location/radiation/quality/duration/timing/severity/associated sxs/prior Treatment) HPI Comments: Patient with history of seizure disorder presents with complaint of left lower extremity pain that began 10 days ago. Patient states that she was originally seen at [facility] in ED after having a seizure. She states that she received IM Ativan and had an IV placed in her foot as well as her left medial calf. Patient states that she received Dilantin through the IV in her calf however she states that she doesn't think that she has veins that work. Patient was seen again in the emergency department on 06/19 and diagnosed with superficial thrombophlebitis. Patient has been using warm compresses and elevating her leg without relief. The symptoms worsen. Patient was seen in emergency department yesterday and was urged to be admitted to the hospital. She could not do this due to family reasons and returns today for admission. Patient states that she has had low-grade fever to 100F without nausea or vomiting. Pain in her leg is severe. It hurts with any movement. Left lower leg pain radiates to her left upper leg, abdomen, and chest. Patient states that she feels slightly short of breath and chest pain is worse with breathing. Onset of symptoms gradual. Course is worsening. Nothing makes symptoms better.  Patient is a 37 y.o. female presenting with leg pain. The history is provided by the patient.  Leg Pain Associated symptoms: fever    Past Medical History  Diagnosis Date  . Seizure disorder, grand mal   . Seizures     epilepsy  . Cervical cancer     2013  . Asthma    Past Surgical History  Procedure Laterality Date  . Brain surgery      2013   No family history on file. History  Substance Use Topics  . Smoking  status: Current Every Day Smoker -- 0.25 packs/day for 16 years    Types: Cigarettes  . Smokeless tobacco: Not on file  . Alcohol Use: No   OB History   Grav Para Term Preterm Abortions TAB SAB Ect Mult Living                 Review of Systems  Constitutional: Positive for fever.  HENT: Negative for sore throat and rhinorrhea.   Eyes: Negative for redness.  Respiratory: Negative for cough.   Cardiovascular: Positive for chest pain and leg swelling.  Gastrointestinal: Negative for nausea, vomiting, abdominal pain and diarrhea.  Genitourinary: Negative for dysuria.  Musculoskeletal: Positive for myalgias.  Skin: Negative for rash.  Neurological: Negative for headaches.    Allergies  Ibuprofen; Latex; Other; and Aspirin  Home Medications   Current Outpatient Rx  Name  Route  Sig  Dispense  Refill  . albuterol (PROVENTIL HFA;VENTOLIN HFA) 108 (90 BASE) MCG/ACT inhaler   Inhalation   Inhale 2 puffs into the lungs every 6 (six) hours as needed for wheezing or shortness of breath.          . beclomethasone (QVAR) 40 MCG/ACT inhaler   Inhalation   Inhale 1 puff into the lungs 2 (two) times daily.         . diazepam (VALIUM) 5 MG tablet   Oral   Take 2 tablets (10 mg total) by mouth 2 (two) times daily as needed for  anxiety.   15 tablet   0   . diphenhydrAMINE (SOMINEX) 25 MG tablet   Oral   Take 50 mg by mouth at bedtime as needed for sleep.          . divalproex (DEPAKOTE) 500 MG DR tablet   Oral   Take 1 tablet (500 mg total) by mouth 3 (three) times daily.   21 tablet   0   . fexofenadine (ALLEGRA) 60 MG tablet   Oral   Take 60 mg by mouth daily.         . fluticasone (FLONASE) 50 MCG/ACT nasal spray   Nasal   Place 1 spray into the nose 2 (two) times daily.         . folic acid (FOLVITE) 1 MG tablet   Oral   Take 1 mg by mouth daily.         Marland Kitchen gabapentin (NEURONTIN) 800 MG tablet   Oral   Take 1 tablet (800 mg total) by mouth 2 (two) times  daily.   14 tablet   0   . hydrochlorothiazide (HYDRODIURIL) 25 MG tablet   Oral   Take 25 mg by mouth daily.         Marland Kitchen HYDROcodone-acetaminophen (NORCO/VICODIN) 5-325 MG per tablet   Oral   Take 1 tablet by mouth every 4 (four) hours as needed for pain.         Marland Kitchen levETIRAcetam (KEPPRA) 1000 MG tablet   Oral   Take 1 tablet (1,000 mg total) by mouth 3 (three) times daily.   21 tablet   0   . loratadine-pseudoephedrine (CLARITIN-D 24-HOUR) 10-240 MG per 24 hr tablet   Oral   Take 1 tablet by mouth daily.         Marland Kitchen omeprazole (PRILOSEC) 40 MG capsule   Oral   Take 40 mg by mouth daily.         . promethazine (PHENERGAN) 25 MG tablet   Oral   Take 1 tablet (25 mg total) by mouth 2 (two) times daily as needed for nausea.   10 tablet   0   . propranolol (INDERAL) 10 MG tablet   Oral   Take 1 tablet (10 mg total) by mouth 2 (two) times daily.   14 tablet   0   . sodium chloride (OCEAN) 0.65 % nasal spray   Nasal   Place 1 spray into the nose 2 (two) times daily as needed for congestion.           BP 106/69  Pulse 88  Temp(Src) 98.9 F (37.2 C) (Oral)  Resp 12  SpO2 99%  LMP 09/16/2012 Physical Exam  Nursing note and vitals reviewed. Constitutional: She appears well-developed and well-nourished.  HENT:  Head: Normocephalic and atraumatic.  Eyes: Conjunctivae are normal. Right eye exhibits no discharge. Left eye exhibits no discharge.  Neck: Normal range of motion. Neck supple.  Cardiovascular: Normal rate, regular rhythm and normal heart sounds.   Pulses:      Dorsalis pedis pulses are 2+ on the right side, and 2+ on the left side.       Posterior tibial pulses are 2+ on the right side, and 2+ on the left side.  Pulmonary/Chest: Effort normal and breath sounds normal.  Abdominal: Soft. There is no tenderness.  Neurological: She is alert.  Skin: Skin is warm and dry.  Redness and warmth of left lower extremity with exquisite tenderness of shin and  calf.  Psychiatric: She has a normal mood and affect.    ED Course  Procedures (including critical care time) Labs Reviewed  CBC WITH DIFFERENTIAL  BASIC METABOLIC PANEL  CBC  BASIC METABOLIC PANEL   Ct Angio Chest Pe W/cm &/or Wo Cm  10/06/2012   *RADIOLOGY REPORT*  Clinical Data: Chest pain.  Left leg pain.  Pulmonary embolism. Deep venous thrombosis.  CT ANGIOGRAPHY CHEST  Technique:  Multidetector CT imaging of the chest using the standard protocol during bolus administration of intravenous contrast. Multiplanar reconstructed images including MIPs were obtained and reviewed to evaluate the vascular anatomy.  Contrast: OMNIPAQUE IOHEXOL 350 MG/ML SOLN  Comparison: None.  Findings: Study is mildly technically degraded by bolus dispersion and body habitus.  No pulmonary embolus is identified.  Heart grossly appears normal.  Incidental imaging of the upper abdomen is within normal limits status post cholecystectomy.  There is no airspace disease.  No pleural effusion.  5 mm triangular nodules present in the right upper lobe (image number 16 series 6). Faint subpleural ground-glass attenuation nodule in the right middle lobe ( image number 49).  Respiratory motion step-off deformity is present in the sternum on the sagittal reconstructions.  Greater than expected thymic tissue is present in the anterior mediastinum.  Additionally, small bilateral enlarged axillary lymph nodes are present.  Largest left axillary lymph node measures 11 mm short axis on axial imaging.  No aggressive osseous lesions.  IMPRESSION: 1.  Although the study is mildly technically degraded, no pulmonary embolus is identified.  No acute aortic abnormality. 2.  Mildly enlarged left axillary lymph node, without cause identified.  This can be associated with left upper extremity inflammatory process but is nonspecific.  No mediastinal adenopathy. 3.  Prominent anterior mediastinal thymic tissue, greater than expected although  there is no discrete mass lesion, likely hyperplasia. 4.  5 mm right upper lobe triangular pulmonary nodule. Although the risk is low, because the patient has a history of malignancy, 12- month noncontrast chest CT follow-up recommended to assess for stability.   Original Report Authenticated By: Andreas Newport, M.D.   1. Brain tumor   2. Cellulitis of left lower leg   3. Superficial thrombophlebitis   4. DVT (deep venous thrombosis), left   5. Asthma, mild intermittent, uncomplicated   6. Pulmonary nodule, right   7. Seizure disorder, grand mal   8. DVT, popliteal, acute, left     12:00 PM Patient seen and examined. Work-up initiated. Medications ordered. D/w and seen by Dr. Manus Gunning.   Vital signs reviewed and are as follows: Filed Vitals:   10/06/12 1132  BP: 106/69  Pulse: 88  Temp: 98.9 F (37.2 C)  Resp: 12   Doppler shows DVT in popliteal vein.   Given report of CP and SOB, CT ordered to r/o PE -- this was negative.   Will admit for DVT, possible cellulitis.   Spoke with [facility] who will admit.    MDM  Admit for DVT, superficial thrombophlebitis, ?cellulitis. Neg PE.   Renne Crigler, PA-C 10/06/12 2021

## 2012-10-06 NOTE — ED Provider Notes (Addendum)
Medical screening examination/treatment/procedure(s) were conducted as a shared visit with non-physician practitioner(s) and myself.  I personally evaluated the patient during the encounter  LLE pain, redness, swelling  Since dilantin infiltration. Left AMA yesterday. Pulses intact. TTP diffusely in lower leg with tenderness to palpation of calf. Check doppler. Suspect some element of celluitis as well.  Glynn Octave, MD 10/06/12 2136  Glynn Octave, MD 10/06/12 2138

## 2012-10-06 NOTE — Progress Notes (Signed)
ANTICOAGULATION CONSULT NOTE - Initial Consult  Pharmacy Consult for Lovenox Indication: DVT  Allergies  Allergen Reactions  . Ibuprofen Shortness Of Breath    wheezing  . Latex Other (See Comments)    Rash, wheezing  . Other Swelling    "GRAPES"  . Aspirin Palpitations    Patient Measurements:   Ht: 65 inches Wt: 97.5 kg  Vital Signs: Temp: 98.9 F (37.2 C) (06/24 1132) Temp src: Oral (06/24 1132) BP: 106/69 mmHg (06/24 1132) Pulse Rate: 88 (06/24 1132)  Labs:  Recent Labs  10/05/12 1830  HGB 13.5  HCT 39.0  PLT 218  CREATININE 0.71    The CrCl is unknown because both a height and weight (above a minimum accepted value) are required for this calculation.   Medical History: Past Medical History  Diagnosis Date  . Seizure disorder, grand mal   . Seizures     epilepsy  . Cervical cancer     2013  . Asthma     Assessment: 37 y/o F with new diagnosis of DVT in the L-popliteal vein. CBC good from 6/23. Renal function good with Scr 0.71 from 6/23. New labs in process for 6/24. No bleeding noted per patient.   Goal of Therapy:  Monitor platelets by anticoagulation protocol: Yes   Plan:  -Start Lovenox 100 mg subcutaneous q12h -Minimum q72h CBC  -Monitor for bleeding -F/U start of oral anticoagulation  Abran Duke, PharmD Clinical Pharmacist Phone: [phone] Pager: [phone] 10/06/2012 1:35 PM

## 2012-10-06 NOTE — ED Notes (Signed)
Team at bedside.

## 2012-10-07 DIAGNOSIS — D496 Neoplasm of unspecified behavior of brain: Secondary | ICD-10-CM

## 2012-10-07 DIAGNOSIS — I82409 Acute embolism and thrombosis of unspecified deep veins of unspecified lower extremity: Secondary | ICD-10-CM

## 2012-10-07 LAB — CBC
MCV: 91.4 fL (ref 78.0–100.0)
Platelets: 219 10*3/uL (ref 150–400)
RBC: 4.07 MIL/uL (ref 3.87–5.11)
WBC: 4.6 10*3/uL (ref 4.0–10.5)

## 2012-10-07 LAB — BASIC METABOLIC PANEL
CO2: 28 mEq/L (ref 19–32)
Chloride: 101 mEq/L (ref 96–112)
Creatinine, Ser: 0.78 mg/dL (ref 0.50–1.10)

## 2012-10-07 MED ORDER — RIVAROXABAN 20 MG PO TABS: 20.0000 mg | ORAL_TABLET | Freq: Every day | ORAL | Status: DC

## 2012-10-07 MED ORDER — RIVAROXABAN 15 MG PO TABS
15.0000 mg | ORAL_TABLET | Freq: Two times a day (BID) | ORAL | Status: DC
  Administered 2012-10-07: 15 mg via ORAL
  Filled 2012-10-07 (×2): qty 1

## 2012-10-07 MED ORDER — HYDROCODONE-ACETAMINOPHEN 5-325 MG PO TABS: 1.0000 | ORAL_TABLET | Freq: Three times a day (TID) | ORAL | Status: DC | PRN

## 2012-10-07 MED ORDER — RIVAROXABAN 15 MG PO TABS: 15.0000 mg | ORAL_TABLET | Freq: Two times a day (BID) | ORAL | Status: DC

## 2012-10-07 NOTE — Progress Notes (Signed)
NURSING PROGRESS NOTE  Lori Ford [id number] Discharge Data: 10/07/2012 4:28 PM Attending Provider: Clydia Llano, MD [id number] Judie Petit, MD     Lowella Dandy to be D/C'd Home per MD order.  Discussed with the patient the After Visit Summary and all questions fully answered. All IV's discontinued with no bleeding noted. All belongings returned to patient for patient to take home.   Last Vital Signs:  Blood pressure 114/66, pulse 64, temperature 98.2 F (36.8 C), temperature source Oral, resp. rate 18, height 5\' 4"  (1.626 m), weight 93.169 kg (205 lb 6.4 oz), last menstrual period 09/16/2012, SpO2 99.00%.  Discharge Medication List   Medication List    TAKE these medications       albuterol 108 (90 BASE) MCG/ACT inhaler  Commonly known as:  PROVENTIL HFA;VENTOLIN HFA  Inhale 2 puffs into the lungs every 6 (six) hours as needed for wheezing or shortness of breath.     beclomethasone 40 MCG/ACT inhaler  Commonly known as:  QVAR  Inhale 1 puff into the lungs 2 (two) times daily.     diazepam 5 MG tablet  Commonly known as:  VALIUM  Take 2 tablets (10 mg total) by mouth 2 (two) times daily as needed for anxiety.     diphenhydrAMINE 25 MG tablet  Commonly known as:  SOMINEX  Take 50 mg by mouth at bedtime as needed for sleep.     divalproex 500 MG DR tablet  Commonly known as:  DEPAKOTE  Take 1 tablet (500 mg total) by mouth 3 (three) times daily.     fexofenadine 60 MG tablet  Commonly known as:  ALLEGRA  Take 60 mg by mouth daily.     fluticasone 50 MCG/ACT nasal spray  Commonly known as:  FLONASE  Place 1 spray into the nose 2 (two) times daily.     folic acid 1 MG tablet  Commonly known as:  FOLVITE  Take 1 mg by mouth daily.     gabapentin 800 MG tablet  Commonly known as:  NEURONTIN  Take 1 tablet (800 mg total) by mouth 2 (two) times daily.     hydrochlorothiazide 25 MG tablet  Commonly known as:  HYDRODIURIL  Take 25 mg by mouth daily.     HYDROcodone-acetaminophen 5-325 MG per tablet  Commonly known as:  NORCO/VICODIN  Take 1-2 tablets by mouth every 8 (eight) hours as needed for pain.     levETIRAcetam 1000 MG tablet  Commonly known as:  KEPPRA  Take 1 tablet (1,000 mg total) by mouth 3 (three) times daily.     loratadine-pseudoephedrine 10-240 MG per 24 hr tablet  Commonly known as:  CLARITIN-D 24-hour  Take 1 tablet by mouth daily.     omeprazole 40 MG capsule  Commonly known as:  PRILOSEC  Take 40 mg by mouth daily.     promethazine 25 MG tablet  Commonly known as:  PHENERGAN  Take 1 tablet (25 mg total) by mouth 2 (two) times daily as needed for nausea.     propranolol 10 MG tablet  Commonly known as:  INDERAL  Take 1 tablet (10 mg total) by mouth 2 (two) times daily.     Rivaroxaban 15 MG Tabs tablet  Commonly known as:  XARELTO  Take 1 tablet (15 mg total) by mouth 2 (two) times daily.     Rivaroxaban 20 MG Tabs  Commonly known as:  XARELTO  Take 1 tablet (20 mg total) by mouth daily.  Start taking on:  10/28/2012     sodium chloride 0.65 % nasal spray  Commonly known as:  OCEAN  Place 1 spray into the nose 2 (two) times daily as needed for congestion.

## 2012-10-07 NOTE — Progress Notes (Signed)
Brief Nutrition Note:   RD pulled to pt for malnutrition screening tool report of unintentional weight loss.  Pt states weight loss was related to not being able to eat from increased stress in her life, including not having housing for several days. Pt states weight loss was about 5 lbs over 1 month, not significant in given time frame. Pt states appetite is normal and is eating well at this time.   Body mass index is 35.24 kg/(m^2). Obesity class 2 Diet: Regular- 100% meal completion   Chart reviewed, no nutrition needs identified at this time. Please consult as needed.   Clarene Duke RD, LDN Pager [phone] After Hours pager [phone]

## 2012-10-07 NOTE — ED Provider Notes (Signed)
Medical screening examination/treatment/procedure(s) were conducted as a shared visit with non-physician practitioner(s) and myself.  I personally evaluated the patient during the encounter  Flint Melter, MD 10/07/12 1341

## 2012-10-07 NOTE — Progress Notes (Signed)
UR COMPLETED  

## 2012-10-07 NOTE — Progress Notes (Signed)
   CARE MANAGEMENT NOTE 10/07/2012  Patient:  BRIEANNA, NAU   Account Number:  [id number]  Date Initiated:  10/07/2012  Documentation initiated by:  [facility]  Subjective/Objective Assessment:   DVT     Action/Plan:   lives at a boarding house   Anticipated DC Date:  10/07/2012   Anticipated DC Plan:  HOME/SELF CARE      DC Planning Services  CM consult  Medication Assistance      Choice offered to / List presented to:             Status of service:  In process, will continue to follow Medicare Important Message given?   (If response is "NO", the following Medicare IM given date fields will be blank) Date Medicare IM given:   Date Additional Medicare IM given:    Discharge Disposition:    Per UR Regulation:    If discussed at Long Length of Stay Meetings, dates discussed:    Comments:  10/07/2012 1100 NCM spoke to pt and states she does have [place] Medicaid. She cannot afford copay for meds at this time. She will receive a SS check on 7/1 for $750.00. Pt states she goes to [facility] on [address] # [phone]. Contacted pharmacy and they can waive her copay. Pt has to let pharmacist know she cannot afford and they will waive copay cost. Isidoro Donning RN CCM Case Mgmt phone [phone]

## 2012-10-07 NOTE — Discharge Summary (Signed)
Physician Discharge Summary  Lori Ford ZOX:[id number] DOB: September 14, 1975 DOA: 10/06/2012  PCP: Jearld Lesch, MD  Admit date: 10/06/2012 Discharge date: 10/07/2012  Time spent: 40 minutes  Recommendations for Outpatient Follow-up:  1. Followup with primary care physician in one week.   Discharge Diagnoses:  Principal Problem:   DVT, popliteal, acute, left Active Problems:   Seizure disorder, grand mal   Brain tumor   5mm Pulmonary nodule, right upper lobe.  repeat CT chest May 2015.   Asthma   Hypertension   GERD (gastroesophageal reflux disease)   Cigarette nicotine dependence with withdrawal   Discharge Condition: Stable  Diet recommendation: Regular  Filed Weights   10/06/12 1800  Weight: 93.169 kg (205 lb 6.4 oz)    History of present illness:  The patient is a 37 y.o. year-old female with history of possible cervical cancer, asthma, meningioma status post excision, and grand mal seizures attributed to head trauma who presents with left lower extremity redness, swelling, and pain. The patient was last at their baseline health prior to June 12. The patient presented on June 12th to the ER and had a grand mal seizure. She left AGAINST MEDICAL ADVICE the next day. She represented on the 14th in status epilepticus. She was loaded with an antiepileptic and discharged home but returned again later that day and then again on the 16th with the same complaint. During one of these episodes of seizure, she had a peripheral IV placed in her left lower extremity. It infiltrated at the time that medication was infusing. She represented to the emergency department on the 19th with left lower extremity pain and was diagnosed with thrombophlebitis. She was given a prescription for Vicodin for pain and discharged. She represents today with worsening 10/10 pain and redness and swelling of the left lower extremity. Duplex in the emergency department demonstrates acute DVT of the popliteal vein of  the left leg as well as superficial thrombophlebitis of the left greater saphenous vein from the calf to above the knee. She was thought to have a combination of DVT and cellulitis causing her pain. She received a dose of clindamycin in the ER and after which she developed itching. She reported that she forgot to mention that she was allergic to clindamycin during this and all of her other previous emergency department visits in the last couple of weeks. She was given a shot of Lovenox in the emergency department and will be admitted under observation to establish primary care for ongoing monitoring of anticoagulation and to determine anticoagulation options. He denies recent stasis. She is unaware she has any underlying malignancies and has not had establish primary care in this area. She's not had a personal history of DVT or pulmonary embolism.  Hospital Course:   1. Acute DVT: Involving the left popliteal vein, patient was started on parenteral Lanoxin to emergency department, patient did not have history of DVT, miscarriages or any family history of DVTs. This was determined patient to receive treatment with Noel Christmas, as patient has [facility] and is preferred medication. Prescription for 20 mg daily was given, patient will receive the medication for the next 3 weeks before she discharged from the hospital. Treatment for 3-6 months is indictated, patient will need hypercoagulable workup after she is off of anticoagulation.  2. Seizure disorder: Patient has history of grand mal seizure, continue on seizure medications.  3. Hypertension: Patient is on propranolol and hydrochlorothiazide, medication continued throughout the hospital stay. She said sometimes she takes OTC  decongestant with phenylephrine, patient again about his medication can increase blood pressure.  4. RUL 5 mm pulmonary nodule: Incidental finding in chest CT scan, this is discussed with the patient, followup might be needed  in about one year.   Procedures:  Bilateral lower extremity Doppler showed DVT involving the left popliteal vein and superficial thrombus in the greater saphenous vein.  Consultations:  None  Discharge Exam: Filed Vitals:   10/06/12 1800 10/06/12 2106 10/07/12 0519 10/07/12 1116  BP: 130/83 119/83 102/67 112/74  Pulse: 81 72 77   Temp: 98.5 F (36.9 C) 99.1 F (37.3 C) 98.4 F (36.9 C)   TempSrc: Oral Oral Oral   Resp: 20 18 20    Height: 5\' 4"  (1.626 m)     Weight: 93.169 kg (205 lb 6.4 oz)     SpO2: 99% 98% 99%    General: Alert and awake, oriented x3, not in any acute distress. HEENT: anicteric sclera, pupils reactive to light and accommodation, EOMI CVS: S1-S2 clear, no murmur rubs or gallops Chest: clear to auscultation bilaterally, no wheezing, rales or rhonchi Abdomen: soft nontender, nondistended, normal bowel sounds, no organomegaly Extremities: no cyanosis, clubbing or edema noted bilaterally Neuro: Cranial nerves II-XII intact, no focal neurological deficits  Discharge Instructions  Discharge Orders   Future Orders Complete By Expires     Increase activity slowly  As directed         Medication List    TAKE these medications       albuterol 108 (90 BASE) MCG/ACT inhaler  Commonly known as:  PROVENTIL HFA;VENTOLIN HFA  Inhale 2 puffs into the lungs every 6 (six) hours as needed for wheezing or shortness of breath.     beclomethasone 40 MCG/ACT inhaler  Commonly known as:  QVAR  Inhale 1 puff into the lungs 2 (two) times daily.     diazepam 5 MG tablet  Commonly known as:  VALIUM  Take 2 tablets (10 mg total) by mouth 2 (two) times daily as needed for anxiety.     diphenhydrAMINE 25 MG tablet  Commonly known as:  SOMINEX  Take 50 mg by mouth at bedtime as needed for sleep.     divalproex 500 MG DR tablet  Commonly known as:  DEPAKOTE  Take 1 tablet (500 mg total) by mouth 3 (three) times daily.     fexofenadine 60 MG tablet  Commonly known  as:  ALLEGRA  Take 60 mg by mouth daily.     fluticasone 50 MCG/ACT nasal spray  Commonly known as:  FLONASE  Place 1 spray into the nose 2 (two) times daily.     folic acid 1 MG tablet  Commonly known as:  FOLVITE  Take 1 mg by mouth daily.     gabapentin 800 MG tablet  Commonly known as:  NEURONTIN  Take 1 tablet (800 mg total) by mouth 2 (two) times daily.     hydrochlorothiazide 25 MG tablet  Commonly known as:  HYDRODIURIL  Take 25 mg by mouth daily.     HYDROcodone-acetaminophen 5-325 MG per tablet  Commonly known as:  NORCO/VICODIN  Take 1 tablet by mouth every 4 (four) hours as needed for pain.     levETIRAcetam 1000 MG tablet  Commonly known as:  KEPPRA  Take 1 tablet (1,000 mg total) by mouth 3 (three) times daily.     loratadine-pseudoephedrine 10-240 MG per 24 hr tablet  Commonly known as:  CLARITIN-D 24-hour  Take 1 tablet by mouth  daily.     omeprazole 40 MG capsule  Commonly known as:  PRILOSEC  Take 40 mg by mouth daily.     promethazine 25 MG tablet  Commonly known as:  PHENERGAN  Take 1 tablet (25 mg total) by mouth 2 (two) times daily as needed for nausea.     propranolol 10 MG tablet  Commonly known as:  INDERAL  Take 1 tablet (10 mg total) by mouth 2 (two) times daily.     Rivaroxaban 15 MG Tabs tablet  Commonly known as:  XARELTO  Take 1 tablet (15 mg total) by mouth 2 (two) times daily.     Rivaroxaban 20 MG Tabs  Commonly known as:  XARELTO  Take 1 tablet (20 mg total) by mouth daily.  Start taking on:  10/28/2012     sodium chloride 0.65 % nasal spray  Commonly known as:  OCEAN  Place 1 spray into the nose 2 (two) times daily as needed for congestion.       Allergies  Allergen Reactions  . Ibuprofen Shortness Of Breath    wheezing  . Clindamycin/Lincomycin Hives  . Latex Other (See Comments)    Rash, wheezing  . Other Swelling    "GRAPES"  . Aspirin Palpitations       Follow-up Information   Follow up with Jearld Lesch, MD In 1 week.   Contact information:   [address] [place] [address] [phone]        The results of significant diagnostics from this hospitalization (including imaging, microbiology, ancillary and laboratory) are listed below for reference.    Significant Diagnostic Studies: Ct Head Wo Contrast  09/28/2012   *RADIOLOGY REPORT*  Clinical Data:  Possible seizure, history of epilepsy and cervical cancer, history of brain surgery (2013), now with left-sided neck pain and had a  CT HEAD WITHOUT CONTRAST CT CERVICAL SPINE WITHOUT CONTRAST  Technique:  Multidetector CT imaging of the head and cervical spine was performed following the standard protocol without intravenous contrast.  Multiplanar CT image reconstructions of the cervical spine were also generated.  Comparison:  09/26/2012; 09/24/2012  CT HEAD  Findings:  Redemonstrated advanced atrophy with prominence of the bifrontal extra-axial spaces. Gray white differentiation is maintained.  No CT evidence of acute large territory infarct.  No intraparenchymal or extra-axial mass or hemorrhage.  Redemonstrated exuberant calcification of the midline falx and cerebellar tentorium. Normal size and configuration of the ventricles and basilar cisterns.  No midline shift.  Limited visualization of the paranasal sinuses is normal.  Stable sequela of left parietal lobe craniotomy.  No displaced calvarial fracture.  IMPRESSION: 1.  No acute intracranial process. 2.  Postsurgical change of the left parietal calvarium.  Age advanced atrophy.  CT CERVICAL SPINE  Findings:  C1 to the superior endplate of T3 is imaged.  There is straightening and slight reversal of the expected cervical lordosis with mild kyphosis centered about the C5 - C6 intervertebral disc space. The bilateral facets are normally aligned.  The dens is normally positioned and the lateral masses of C1.  A normal atlantodental and atlanto-axial articulations.  Cervical vertebral body  heights are preserved.  Prevertebral soft tissues are normal.  There is mild to moderate multilevel cervical spine DDD, worse at C5 - C6 and C6 - C7 with disc space height loss, end plate irregularity and sclerosis.  Limited visualization of the lung apices demonstrates an approximately 0.5 x 0.5 cm nodule within the right lung apex (image  79, series four).  Shoddy right-sided lymph nodes within the subcutaneous tissues of the posterior aspect of the neck are not enlarged by CT criteria with index node measuring 7 mm in greatest short axis diameter (image 29, series six).  Regional soft tissues are normal.  IMPRESSION: 1.  No fracture static subluxation of the cervical spine. 2.  Mild to moderate multilevel cervical spine DDD, worse at C5 - C6 and C6 - C7. 3.  Incidental note made of a approximately 5 mm nodule within the imaged right lung apex.   If the patient is at high risk for bronchogenic carcinoma, follow-up chest CT at 6-12 months is recommended.  If the patient is at low risk for bronchogenic carcinoma, follow-up chest CT at 12 months is recommended.  This recommendation follows the consensus statement: Guidelines for Management of Small Pulmonary Nodules Detected on CT Scans: A Statement from the Fleischner Society as published in Radiology 2005; 237:395-400.   Original Report Authenticated By: Tacey Ruiz, MD   Ct Head Wo Contrast  09/26/2012   *RADIOLOGY REPORT*  Clinical Data: Seizure.  Cervical carcinoma.  CT HEAD WITHOUT CONTRAST  Technique:  Contiguous axial images were obtained from the base of the skull through the vertex without contrast.  Comparison: 09/24/2012  Findings: There is no evidence of intracranial hemorrhage, brain edema or other signs of acute infarction.  There is no evidence of intracranial mass lesion or mass effect.  No abnormal extra-axial fluid collections are identified.  Ventricles are normal in size.  No evidence of skull fracture.  Old left temporal parietal craniotomy  noted.  IMPRESSION: No acute intracranial abnormality.   Original Report Authenticated By: Myles Rosenthal, M.D.   Ct Head Wo Contrast  09/24/2012   *RADIOLOGY REPORT*  Clinical Data: Two seizures.  History of brain surgery 1 year ago. Patient is currently unresponsive.  CT HEAD WITHOUT CONTRAST  Technique:  Contiguous axial images were obtained from the base of the skull through the vertex without contrast.  Comparison: None.  Findings: Study is somewhat technically limited due to streak artifact.  Postoperative changes in the left frontal temporal region with craniotomy and plate screw fixation.  No depressed skull fractures are visualized.  Prominent extra-axial calcifications are seen along the falx and tentorium.  These calcifications may be normal variation, due to dystrophic changes, or previous infection.  There is no mass effect or midline shift. No abnormal extra-axial fluid collections.  Gray-white matter junctions are distinct.  Basal cisterns are not effaced. Suggestion of mild septal malacia in the left anterior temporal region which may represent postoperative change or artifact.  No definite evidence of any mass lesion.  No acute intracranial hemorrhage.  No ventricular dilatation.  Visualized paranasal sinuses and mastoid air cells are not opacified.  IMPRESSION: Postoperative changes on the left.  No acute intracranial abnormalities are suggested.  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   Original Report Authenticated By: Burman Nieves, M.D.   Ct Angio Chest Pe W/cm &/or Wo Cm  10/06/2012   *RADIOLOGY REPORT*  Clinical Data: Chest pain.  Left leg pain.  Pulmonary embolism. Deep venous thrombosis.  CT ANGIOGRAPHY CHEST  Technique:  Multidetector CT imaging of the chest using the standard protocol during bolus administration of intravenous contrast. Multiplanar reconstructed images including MIPs were obtained and reviewed to evaluate the vascular anatomy.   Contrast: OMNIPAQUE IOHEXOL 350 MG/ML SOLN  Comparison: None.  Findings: Study is mildly technically degraded by bolus  dispersion and body habitus.  No pulmonary embolus is identified.  Heart grossly appears normal.  Incidental imaging of the upper abdomen is within normal limits status post cholecystectomy.  There is no airspace disease.  No pleural effusion.  5 mm triangular nodules present in the right upper lobe (image number 16 series 6). Faint subpleural ground-glass attenuation nodule in the right middle lobe ( image number 49).  Respiratory motion step-off deformity is present in the sternum on the sagittal reconstructions.  Greater than expected thymic tissue is present in the anterior mediastinum.  Additionally, small bilateral enlarged axillary lymph nodes are present.  Largest left axillary lymph node measures 11 mm short axis on axial imaging.  No aggressive osseous lesions.  IMPRESSION: 1.  Although the study is mildly technically degraded, no pulmonary embolus is identified.  No acute aortic abnormality. 2.  Mildly enlarged left axillary lymph node, without cause identified.  This can be associated with left upper extremity inflammatory process but is nonspecific.  No mediastinal adenopathy. 3.  Prominent anterior mediastinal thymic tissue, greater than expected although there is no discrete mass lesion, likely hyperplasia. 4.  5 mm right upper lobe triangular pulmonary nodule. Although the risk is low, because the patient has a history of malignancy, 12- month noncontrast chest CT follow-up recommended to assess for stability.   Original Report Authenticated By: Andreas Newport, M.D.   Ct Cervical Spine Wo Contrast  09/28/2012   *RADIOLOGY REPORT*  Clinical Data:  Possible seizure, history of epilepsy and cervical cancer, history of brain surgery (2013), now with left-sided neck pain and had a  CT HEAD WITHOUT CONTRAST CT CERVICAL SPINE WITHOUT CONTRAST  Technique:  Multidetector CT imaging  of the head and cervical spine was performed following the standard protocol without intravenous contrast.  Multiplanar CT image reconstructions of the cervical spine were also generated.  Comparison:  09/26/2012; 09/24/2012  CT HEAD  Findings:  Redemonstrated advanced atrophy with prominence of the bifrontal extra-axial spaces. Gray white differentiation is maintained.  No CT evidence of acute large territory infarct.  No intraparenchymal or extra-axial mass or hemorrhage.  Redemonstrated exuberant calcification of the midline falx and cerebellar tentorium. Normal size and configuration of the ventricles and basilar cisterns.  No midline shift.  Limited visualization of the paranasal sinuses is normal.  Stable sequela of left parietal lobe craniotomy.  No displaced calvarial fracture.  IMPRESSION: 1.  No acute intracranial process. 2.  Postsurgical change of the left parietal calvarium.  Age advanced atrophy.  CT CERVICAL SPINE  Findings:  C1 to the superior endplate of T3 is imaged.  There is straightening and slight reversal of the expected cervical lordosis with mild kyphosis centered about the C5 - C6 intervertebral disc space. The bilateral facets are normally aligned.  The dens is normally positioned and the lateral masses of C1.  A normal atlantodental and atlanto-axial articulations.  Cervical vertebral body heights are preserved.  Prevertebral soft tissues are normal.  There is mild to moderate multilevel cervical spine DDD, worse at C5 - C6 and C6 - C7 with disc space height loss, end plate irregularity and sclerosis.  Limited visualization of the lung apices demonstrates an approximately 0.5 x 0.5 cm nodule within the right lung apex (image 79, series four).  Shoddy right-sided lymph nodes within the subcutaneous tissues of the posterior aspect of the neck are not enlarged by CT criteria with index node measuring 7 mm in greatest short axis diameter (image 29, series six).  Regional soft  tissues are  normal.  IMPRESSION: 1.  No fracture static subluxation of the cervical spine. 2.  Mild to moderate multilevel cervical spine DDD, worse at C5 - C6 and C6 - C7. 3.  Incidental note made of a approximately 5 mm nodule within the imaged right lung apex.   If the patient is at high risk for bronchogenic carcinoma, follow-up chest CT at 6-12 months is recommended.  If the patient is at low risk for bronchogenic carcinoma, follow-up chest CT at 12 months is recommended.  This recommendation follows the consensus statement: Guidelines for Management of Small Pulmonary Nodules Detected on CT Scans: A Statement from the Fleischner Society as published in Radiology 2005; 237:395-400.   Original Report Authenticated By: Tacey Ruiz, MD   Dg Chest Port 1 View  09/24/2012   *RADIOLOGY REPORT*  Clinical Data: Seizure  PORTABLE CHEST - 1 VIEW  Comparison: None.  Findings: Mild hypoventilation with mild bibasilar atelectasis.  No definite pneumonia.  No heart failure or effusion.  IMPRESSION: Hypoventilation with mild atelectasis in the lung bases.   Original Report Authenticated By: Janeece Riggers, M.D.    Microbiology: No results found for this or any previous visit (from the past 240 hour(s)).   Labs: Basic Metabolic Panel:  Recent Labs Lab 10/05/12 1830 10/06/12 1323 10/07/12 0601  NA 138 139 135  K 3.3* 3.9 3.7  CL 102 102 101  CO2 29 30 28   GLUCOSE 112* 97 94  BUN 12 8 8   CREATININE 0.71 0.76 0.78  CALCIUM 9.3 8.9 8.6   Liver Function Tests: No results found for this basename: AST, ALT, ALKPHOS, BILITOT, PROT, ALBUMIN,  in the last 168 hours No results found for this basename: LIPASE, AMYLASE,  in the last 168 hours No results found for this basename: AMMONIA,  in the last 168 hours CBC:  Recent Labs Lab 10/05/12 1830 10/06/12 1323 10/07/12 0601  WBC 5.0 4.9 4.6  NEUTROABS 2.3 2.3  --   HGB 13.5 12.7 12.7  HCT 39.0 37.0 37.2  MCV 92.4 92.0 91.4  PLT 218 214 219   Cardiac Enzymes: No  results found for this basename: CKTOTAL, CKMB, CKMBINDEX, TROPONINI,  in the last 168 hours BNP: BNP (last 3 results) No results found for this basename: PROBNP,  in the last 8760 hours CBG: No results found for this basename: GLUCAP,  in the last 168 hours     Signed:  Kairy Folsom A  [facility] 10/07/2012, 12:08 PM

## 2012-10-07 NOTE — Progress Notes (Signed)
   CARE MANAGEMENT NOTE 10/07/2012  Patient:  Lori Ford, Lori Ford   Account Number:  [id number]  Date Initiated:  10/07/2012  Documentation initiated by:  [facility]  Subjective/Objective Assessment:   DVT     Action/Plan:   lives at a boarding house   Anticipated DC Date:  10/07/2012   Anticipated DC Plan:  HOME/SELF CARE      DC Planning Services  CM consult  Medication Assistance      Choice offered to / List presented to:             Status of service:  Completed, signed off Medicare Important Message given?   (If response is "NO", the following Medicare IM given date fields will be blank) Date Medicare IM given:   Date Additional Medicare IM given:    Discharge Disposition:  HOME/SELF CARE  Per UR Regulation:    If discussed at Long Length of Stay Meetings, dates discussed:    Comments:  10/07/2012 1400 NCM faxed Xarelto Rx to [facility]. Pt states she was able to contact housing authorities and they provided pt with instructions to follow up after dc. She did contact SS Admin office to have address updated. She received bus passes. Provided pt with additional community resources such as [employer]. Isidoro Donning RN CCM Case Mgmt phone [phone]  10/07/2012 1100 NCM spoke to pt and states she does have [place] Medicaid. She cannot afford copay for meds at this time. She will receive a SS check on 7/1 for $750.00. Pt states she goes to [facility] on [address] # [phone]. Contacted pharmacy and they can waive her copay. Pt has to let pharmacist know she cannot afford and they will waive copay cost. Isidoro Donning RN CCM Case Mgmt phone [phone]

## 2012-11-06 NOTE — Care Management Note (Signed)
    Page 1 of 2   11/06/2012     3:13:37 PM   CARE MANAGEMENT NOTE 11/06/2012  Patient:  Lori Ford, Lori Ford   Account Number:  [id number]  Date Initiated:  10/07/2012  Documentation initiated by:  [facility]  Subjective/Objective Assessment:   DVT     Action/Plan:   lives at a boarding house   Anticipated DC Date:  10/07/2012   Anticipated DC Plan:  HOME/SELF CARE      DC Planning Services  CM consult  Medication Assistance      Choice offered to / List presented to:             Status of service:  Completed, signed off Medicare Important Message given?   (If response is "NO", the following Medicare IM given date fields will be blank) Date Medicare IM given:   Date Additional Medicare IM given:    Discharge Disposition:  HOME/SELF CARE  Per UR Regulation:    If discussed at Long Length of Stay Meetings, dates discussed:    Comments:  11/06/12 15:07 Lori Cape RN, BSN [phone] patient call stating that she needs a prescription for lovenox because she is taking too many pills right now.  I informed MD about this , he states they did not rec patient for lovenox , xarelto is actually better and if the patient wants lovenox she will have to go to her PCP , who is Lori Ford , this is where the information was sent. Patient states the Medicaid CM changed her PCP .  I informed her she will need to see her PCP for the lovenox.  10/07/2012 1400 NCM faxed Xarelto Rx to [facility]. Pt states she was able to contact housing authorities and they provided pt with instructions to follow up after dc. She did contact SS Admin office to have address updated. She received bus passes. Provided pt with additional community resources such as [employer]. Lori Donning RN CCM Case Mgmt phone [phone]  10/07/2012 1100 NCM spoke to pt and states she does have [place] Medicaid. She cannot afford copay for meds at this time. She will receive a SS check on 7/1 for $750.00. Pt states she goes to  [facility] on [address] # [phone]. Contacted pharmacy and they can waive her copay. Pt has to let pharmacist know she cannot afford and they will waive copay cost. Lori Donning RN CCM Case Mgmt phone [phone]

## 2012-11-07 ENCOUNTER — Encounter (HOSPITAL_COMMUNITY): Payer: Self-pay | Admitting: *Deleted

## 2012-11-07 ENCOUNTER — Emergency Department (HOSPITAL_COMMUNITY)
Admission: EM | Admit: 2012-11-07 | Discharge: 2012-11-07 | Disposition: A | Discharge status: Home / Self Care | Payer: Medicaid Other | Attending: Emergency Medicine | Admitting: Emergency Medicine

## 2012-11-07 DIAGNOSIS — I10 Essential (primary) hypertension: Secondary | ICD-10-CM | POA: Insufficient documentation

## 2012-11-07 DIAGNOSIS — G40909 Epilepsy, unspecified, not intractable, without status epilepticus: Secondary | ICD-10-CM | POA: Insufficient documentation

## 2012-11-07 DIAGNOSIS — J45909 Unspecified asthma, uncomplicated: Secondary | ICD-10-CM | POA: Insufficient documentation

## 2012-11-07 DIAGNOSIS — N949 Unspecified condition associated with female genital organs and menstrual cycle: Secondary | ICD-10-CM | POA: Insufficient documentation

## 2012-11-07 DIAGNOSIS — F411 Generalized anxiety disorder: Secondary | ICD-10-CM | POA: Insufficient documentation

## 2012-11-07 DIAGNOSIS — I82409 Acute embolism and thrombosis of unspecified deep veins of unspecified lower extremity: Secondary | ICD-10-CM | POA: Insufficient documentation

## 2012-11-07 DIAGNOSIS — F419 Anxiety disorder, unspecified: Secondary | ICD-10-CM

## 2012-11-07 DIAGNOSIS — Z79899 Other long term (current) drug therapy: Secondary | ICD-10-CM | POA: Insufficient documentation

## 2012-11-07 DIAGNOSIS — K219 Gastro-esophageal reflux disease without esophagitis: Secondary | ICD-10-CM | POA: Insufficient documentation

## 2012-11-07 DIAGNOSIS — N939 Abnormal uterine and vaginal bleeding, unspecified: Secondary | ICD-10-CM

## 2012-11-07 DIAGNOSIS — N938 Other specified abnormal uterine and vaginal bleeding: Secondary | ICD-10-CM | POA: Insufficient documentation

## 2012-11-07 DIAGNOSIS — F17200 Nicotine dependence, unspecified, uncomplicated: Secondary | ICD-10-CM | POA: Insufficient documentation

## 2012-11-07 DIAGNOSIS — Z8719 Personal history of other diseases of the digestive system: Secondary | ICD-10-CM | POA: Insufficient documentation

## 2012-11-07 DIAGNOSIS — Z8541 Personal history of malignant neoplasm of cervix uteri: Secondary | ICD-10-CM | POA: Insufficient documentation

## 2012-11-07 DIAGNOSIS — Z8679 Personal history of other diseases of the circulatory system: Secondary | ICD-10-CM | POA: Insufficient documentation

## 2012-11-07 DIAGNOSIS — I82402 Acute embolism and thrombosis of unspecified deep veins of left lower extremity: Secondary | ICD-10-CM

## 2012-11-07 DIAGNOSIS — Z3202 Encounter for pregnancy test, result negative: Secondary | ICD-10-CM | POA: Insufficient documentation

## 2012-11-07 DIAGNOSIS — Z91040 Latex allergy status: Secondary | ICD-10-CM | POA: Insufficient documentation

## 2012-11-07 HISTORY — DX: Acute embolism and thrombosis of unspecified deep veins of unspecified lower extremity: I82.409

## 2012-11-07 LAB — URINALYSIS, ROUTINE W REFLEX MICROSCOPIC
Bilirubin Urine: NEGATIVE
Glucose, UA: NEGATIVE mg/dL
Specific Gravity, Urine: 1.012 (ref 1.005–1.030)

## 2012-11-07 LAB — CBC WITH DIFFERENTIAL/PLATELET
Hemoglobin: 12.3 g/dL (ref 12.0–15.0)
Lymphocytes Relative: 44 % (ref 12–46)
Lymphs Abs: 1.7 10*3/uL (ref 0.7–4.0)
MCH: 31.2 pg (ref 26.0–34.0)
Monocytes Relative: 11 % (ref 3–12)
Neutro Abs: 1.7 10*3/uL (ref 1.7–7.7)
Neutrophils Relative %: 43 % (ref 43–77)
RBC: 3.94 MIL/uL (ref 3.87–5.11)
WBC: 4 10*3/uL (ref 4.0–10.5)

## 2012-11-07 LAB — PROTIME-INR
INR: 1.33 (ref 0.00–1.49)
Prothrombin Time: 16.2 seconds — ABNORMAL HIGH (ref 11.6–15.2)

## 2012-11-07 LAB — VALPROIC ACID LEVEL: Valproic Acid Lvl: 36.3 ug/mL — ABNORMAL LOW (ref 50.0–100.0)

## 2012-11-07 LAB — BASIC METABOLIC PANEL
BUN: 8 mg/dL (ref 6–23)
Chloride: 102 mEq/L (ref 96–112)
Glucose, Bld: 83 mg/dL (ref 70–99)
Potassium: 3.6 mEq/L (ref 3.5–5.1)

## 2012-11-07 LAB — WET PREP, GENITAL
Trich, Wet Prep: NONE SEEN
WBC, Wet Prep HPF POC: NONE SEEN
Yeast Wet Prep HPF POC: NONE SEEN

## 2012-11-07 LAB — URINE MICROSCOPIC-ADD ON

## 2012-11-07 LAB — PREGNANCY, URINE: Preg Test, Ur: NEGATIVE

## 2012-11-07 MED ORDER — HYDROCODONE-ACETAMINOPHEN 5-325 MG PO TABS: 1.0000 | ORAL_TABLET | Freq: Three times a day (TID) | ORAL | Status: DC | PRN

## 2012-11-07 MED ORDER — SODIUM CHLORIDE 0.9 % IV BOLUS (SEPSIS): 1000.0000 mL | Freq: Once | INTRAVENOUS | Status: DC

## 2012-11-07 MED ORDER — OXYCODONE-ACETAMINOPHEN 5-325 MG PO TABS
2.0000 | ORAL_TABLET | Freq: Once | ORAL | Status: AC
  Administered 2012-11-07: 2 via ORAL
  Filled 2012-11-07: qty 2

## 2012-11-07 MED ORDER — ONDANSETRON 4 MG PO TBDP
4.0000 mg | ORAL_TABLET | Freq: Once | ORAL | Status: AC
  Administered 2012-11-07: 4 mg via ORAL
  Filled 2012-11-07: qty 1

## 2012-11-07 MED ORDER — ENOXAPARIN SODIUM 100 MG/ML ~~LOC~~ SOLN: 1.0000 mg/kg | Freq: Two times a day (BID) | SUBCUTANEOUS | Status: DC

## 2012-11-07 MED ORDER — DIAZEPAM 5 MG PO TABS: 10.0000 mg | ORAL_TABLET | Freq: Two times a day (BID) | ORAL | Status: DC | PRN

## 2012-11-07 MED ORDER — ENOXAPARIN SODIUM 100 MG/ML ~~LOC~~ SOLN
95.0000 mg | Freq: Once | SUBCUTANEOUS | Status: AC
  Administered 2012-11-07: 95 mg via SUBCUTANEOUS
  Filled 2012-11-07: qty 1

## 2012-11-07 NOTE — ED Notes (Signed)
Patient has a recent history of left lower leg DVT.  She was D/C from the hospital on June 28th.  She was anticoagulated with Lesia Hausen, She had to stop her Lesia Hausen on July 24 due to excessive vaginal bleeding.  She comes to the ED with C/O increased pain in her left lower leg. Some swelling in noted. Her left lower leg is very tender to light palpation. She states that her leg feels the same as it was when she was first diagnosed with DVT.

## 2012-11-07 NOTE — ED Provider Notes (Signed)
Medical screening examination/treatment/procedure(s) were performed by non-physician practitioner and as supervising physician I was immediately available for consultation/collaboration.   Loren Racer, MD 11/07/12 [phone]

## 2012-11-07 NOTE — ED Notes (Signed)
Reports hx of dvt left leg and was dc from hospital on 6/23, still having pain to left lower leg and mild swelling. Ambulatory at triage, no acute distress noted.

## 2012-11-07 NOTE — ED Provider Notes (Signed)
CSN: [id number]     Arrival date & time 11/07/12  1478 History     First MD Initiated Contact with Patient 11/07/12 [phone]     Chief Complaint  Patient presents with  . Leg Pain   (Consider location/radiation/quality/duration/timing/severity/associated sxs/prior Treatment) HPI   Lori Ford is a 37 y.o.female with a significant PMH of seizure disorder, cervical disease, asthma, meningioma, gallstones, hypertension, GERD, allergic rhinitis, DVT presents to the ER with complaints of left leg pain and anxiety. At one month ago the patient was diagnosed with DVT admitted to the hospital and was supposed to be started on Lovenox but did not get the prescription therefore the relative was called in. Since then she has been having vaginal bleeding. She also continues to have leg pain. She also is complaining of having anxiety as she has run out of her diazepam medication and would like a refill here. The patient has a primary care doctor's appointment on August 7 which she will get a Pap smear, refill for anxiety medication and refills on her anticoagulation meds. She denies having any shortness of breath, anxiety attacks, chest pain, wheezing, worsening leg pain.   Past Medical History  Diagnosis Date  . 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 Seizures     due to head trauma as adult  . Cervical cancer     2013, untreated  . Asthma     as child  . Meningioma   . Gallstones     s/p cholecystectomy  . HTN (hypertension)   . GERD (gastroesophageal reflux disease)   . Allergic rhinitis   . Cigarette nicotine dependence with withdrawal   . DVT (deep venous thrombosis)    Past Surgical History  Procedure Laterality Date  . Brain surgery      2013 to remove a meningioma  . Cholecystectomy     Family History  Problem Relation Age of Onset  . Other Mother     varicose vein  . Deep vein thrombosis Neg Hx   . Pulmonary embolism Neg Hx   . Cancer    . Diabetes    . High blood  pressure    . Asthma    . Thyroid disease     History  Substance Use Topics  . Smoking status: Current Every Day Smoker -- 0.25 packs/day for 16 years    Types: Cigarettes  . Smokeless tobacco: Not on file  . Alcohol Use: No   OB History   Grav Para Term Preterm Abortions TAB SAB Ect Mult Living                 Review of Systems ROS use otherwise negative unless stated in history of present illness Allergies  Ibuprofen; Clindamycin/lincomycin; Latex; Other; and Aspirin  Home Medications   Current Outpatient Rx  Name  Route  Sig  Dispense  Refill  . albuterol (PROVENTIL HFA;VENTOLIN HFA) 108 (90 BASE) MCG/ACT inhaler   Inhalation   Inhale 2 puffs into the lungs every 6 (six) hours as needed for wheezing or shortness of breath.          . beclomethasone (QVAR) 40 MCG/ACT inhaler   Inhalation   Inhale 1 puff into the lungs 2 (two) times daily.         . diphenhydrAMINE (SOMINEX) 25 MG tablet   Oral   Take 50 mg by mouth at bedtime as needed for sleep.          Marland Kitchen  divalproex (DEPAKOTE) 500 MG DR tablet   Oral   Take 1 tablet (500 mg total) by mouth 3 (three) times daily.   21 tablet   0   . fexofenadine (ALLEGRA) 60 MG tablet   Oral   Take 60 mg by mouth daily.         . fluticasone (FLONASE) 50 MCG/ACT nasal spray   Nasal   Place 1 spray into the nose 2 (two) times daily.         . folic acid (FOLVITE) 1 MG tablet   Oral   Take 1 mg by mouth daily.         Marland Kitchen gabapentin (NEURONTIN) 800 MG tablet   Oral   Take 1 tablet (800 mg total) by mouth 2 (two) times daily.   14 tablet   0   . hydrochlorothiazide (HYDRODIURIL) 25 MG tablet   Oral   Take 25 mg by mouth daily.         Marland Kitchen HYDROcodone-acetaminophen (NORCO/VICODIN) 5-325 MG per tablet   Oral   Take 1-2 tablets by mouth every 8 (eight) hours as needed for pain.   30 tablet   0   . levETIRAcetam (KEPPRA) 1000 MG tablet   Oral   Take 1 tablet (1,000 mg total) by mouth 3 (three) times  daily.   21 tablet   0   . loratadine-pseudoephedrine (CLARITIN-D 24-HOUR) 10-240 MG per 24 hr tablet   Oral   Take 1 tablet by mouth daily.         Marland Kitchen omeprazole (PRILOSEC) 40 MG capsule   Oral   Take 40 mg by mouth daily.         . promethazine (PHENERGAN) 25 MG tablet   Oral   Take 1 tablet (25 mg total) by mouth 2 (two) times daily as needed for nausea.   10 tablet   0   . propranolol (INDERAL) 10 MG tablet   Oral   Take 1 tablet (10 mg total) by mouth 2 (two) times daily.   14 tablet   0   . sodium chloride (OCEAN) 0.65 % nasal spray   Nasal   Place 1 spray into the nose 2 (two) times daily as needed for congestion.          . diazepam (VALIUM) 5 MG tablet   Oral   Take 2 tablets (10 mg total) by mouth 2 (two) times daily as needed for anxiety.   20 tablet   0   . enoxaparin (LOVENOX) 100 MG/ML injection   Subcutaneous   Inject 0.95 mLs (95 mg total) into the skin every 12 (twelve) hours.   30 Syringe   0    BP 117/80  Pulse 59  Temp(Src) 97.8 F (36.6 C) (Oral)  Resp 18  SpO2 100%  LMP 11/02/2012 Physical Exam  Nursing note and vitals reviewed. Constitutional: She appears well-developed and well-nourished. No distress.  HENT:  Head: Normocephalic and atraumatic.  Eyes: Pupils are equal, round, and reactive to light.  Neck: Normal range of motion. Neck supple.  Cardiovascular: Normal rate and regular rhythm.   Pulmonary/Chest: Effort normal.  Abdominal: Soft.  Genitourinary: Uterus normal. Uterus is not enlarged. Right adnexum displays no mass and no tenderness. Left adnexum displays no mass and no tenderness. There is tenderness around the vagina. Vaginal discharge found.  Musculoskeletal:  No grossly apparent swelling to left leg. Pain to palpation, pedal pulses are symmetrical.  Neurological: She is alert.  Skin: Skin  is warm and dry.    ED Course   Procedures (including critical care time)  Labs Reviewed  WET PREP, GENITAL -  Abnormal; Notable for the following:    Clue Cells Wet Prep HPF POC FEW (*)    All other components within normal limits  PROTIME-INR - Abnormal; Notable for the following:    Prothrombin Time 16.2 (*)    All other components within normal limits  VALPROIC ACID LEVEL - Abnormal; Notable for the following:    Valproic Acid Lvl 36.3 (*)    All other components within normal limits  URINALYSIS, ROUTINE W REFLEX MICROSCOPIC - Abnormal; Notable for the following:    Hgb urine dipstick SMALL (*)    All other components within normal limits  GC/CHLAMYDIA PROBE AMP  CBC WITH DIFFERENTIAL  BASIC METABOLIC PANEL  PREGNANCY, URINE  URINE MICROSCOPIC-ADD ON   No results found. 1. Anxiety   2. DVT (deep venous thrombosis), left   3. Vaginal bleeding     MDM  Will start patient on Lovenox shots Will refill anxiety medication till her appointment on 11/19/2012 Pelvic exam showed no abnormalities NO concern for PE at this time. Bleeding has slowed down significantly and hemoglobin is within normal limits.  Rx. Lovenox and Valium.  37 y.o.Deshaun Brinkman's evaluation in the Emergency Department is complete. It has been determined that no acute conditions requiring further emergency intervention are present at this time. The patient/guardian have been advised of the diagnosis and plan. We have discussed signs and symptoms that warrant return to the ED, such as changes or worsening in symptoms.  Vital signs are stable at discharge. Filed Vitals:   11/07/12 1258  BP: 117/80  Pulse: 59  Temp: 97.8 F (36.6 C)  Resp: 18    Patient/guardian has voiced understanding and agreed to follow-up with the PCP or specialist.    Dorthula Matas, PA-C 11/07/12 1319

## 2012-11-26 ENCOUNTER — Other Ambulatory Visit (HOSPITAL_COMMUNITY): Payer: Self-pay | Admitting: Surgery

## 2012-11-26 DIAGNOSIS — I82419 Acute embolism and thrombosis of unspecified femoral vein: Secondary | ICD-10-CM

## 2012-11-27 ENCOUNTER — Ambulatory Visit (HOSPITAL_COMMUNITY)
Admission: RE | Admit: 2012-11-27 | Discharge: 2012-11-27 | Disposition: A | Discharge status: Home / Self Care | Payer: PRIVATE HEALTH INSURANCE | Source: Ambulatory Visit | Attending: Surgery | Admitting: Surgery

## 2012-11-27 ENCOUNTER — Encounter (HOSPITAL_COMMUNITY): Payer: Self-pay

## 2012-11-27 ENCOUNTER — Other Ambulatory Visit: Payer: Self-pay | Admitting: Radiology

## 2012-11-27 DIAGNOSIS — I82419 Acute embolism and thrombosis of unspecified femoral vein: Secondary | ICD-10-CM

## 2012-11-27 DIAGNOSIS — C539 Malignant neoplasm of cervix uteri, unspecified: Secondary | ICD-10-CM | POA: Insufficient documentation

## 2012-11-27 DIAGNOSIS — I82819 Embolism and thrombosis of superficial veins of unspecified lower extremities: Secondary | ICD-10-CM | POA: Insufficient documentation

## 2012-11-27 DIAGNOSIS — I824Y9 Acute embolism and thrombosis of unspecified deep veins of unspecified proximal lower extremity: Secondary | ICD-10-CM | POA: Insufficient documentation

## 2012-11-27 DIAGNOSIS — IMO0002 Reserved for concepts with insufficient information to code with codable children: Secondary | ICD-10-CM | POA: Insufficient documentation

## 2012-11-27 DIAGNOSIS — Z881 Allergy status to other antibiotic agents status: Secondary | ICD-10-CM | POA: Insufficient documentation

## 2012-11-27 DIAGNOSIS — Z7901 Long term (current) use of anticoagulants: Secondary | ICD-10-CM | POA: Insufficient documentation

## 2012-11-27 DIAGNOSIS — F17200 Nicotine dependence, unspecified, uncomplicated: Secondary | ICD-10-CM | POA: Insufficient documentation

## 2012-11-27 DIAGNOSIS — K219 Gastro-esophageal reflux disease without esophagitis: Secondary | ICD-10-CM | POA: Insufficient documentation

## 2012-11-27 DIAGNOSIS — Z886 Allergy status to analgesic agent status: Secondary | ICD-10-CM | POA: Insufficient documentation

## 2012-11-27 DIAGNOSIS — Z8709 Personal history of other diseases of the respiratory system: Secondary | ICD-10-CM | POA: Insufficient documentation

## 2012-11-27 DIAGNOSIS — Z91040 Latex allergy status: Secondary | ICD-10-CM | POA: Insufficient documentation

## 2012-11-27 DIAGNOSIS — Z79899 Other long term (current) drug therapy: Secondary | ICD-10-CM | POA: Insufficient documentation

## 2012-11-27 DIAGNOSIS — Z91018 Allergy to other foods: Secondary | ICD-10-CM | POA: Insufficient documentation

## 2012-11-27 DIAGNOSIS — J309 Allergic rhinitis, unspecified: Secondary | ICD-10-CM | POA: Insufficient documentation

## 2012-11-27 DIAGNOSIS — I10 Essential (primary) hypertension: Secondary | ICD-10-CM | POA: Insufficient documentation

## 2012-11-27 DIAGNOSIS — Z87820 Personal history of traumatic brain injury: Secondary | ICD-10-CM | POA: Insufficient documentation

## 2012-11-27 DIAGNOSIS — G40309 Generalized idiopathic epilepsy and epileptic syndromes, not intractable, without status epilepticus: Secondary | ICD-10-CM | POA: Insufficient documentation

## 2012-11-27 LAB — CBC
Hemoglobin: 12.8 g/dL (ref 12.0–15.0)
MCHC: 33.9 g/dL (ref 30.0–36.0)
RBC: 4.09 MIL/uL (ref 3.87–5.11)
WBC: 4.4 10*3/uL (ref 4.0–10.5)

## 2012-11-27 LAB — BASIC METABOLIC PANEL
GFR calc Af Amer: >90 mL/min (ref >90)
GFR calc non Af Amer: >90 mL/min (ref >90)
Potassium: 3.5 mEq/L (ref 3.5–5.1)
Sodium: 139 mEq/L (ref 135–145)

## 2012-11-27 LAB — PROTIME-INR
INR: 0.94 (ref 0.00–1.49)
Prothrombin Time: 12.4 seconds (ref 11.6–15.2)

## 2012-11-27 MED ORDER — ENOXAPARIN SODIUM 150 MG/ML ~~LOC~~ SOLN
1.5000 mg/kg | SUBCUTANEOUS | Status: AC
  Administered 2012-11-27: 140 mg via SUBCUTANEOUS
  Filled 2012-11-27: qty 1

## 2012-11-27 MED ORDER — IOHEXOL 300 MG/ML  SOLN
100.0000 mL | Freq: Once | INTRAMUSCULAR | Status: AC | PRN
  Administered 2012-11-27: 50 mL via INTRAVENOUS

## 2012-11-27 MED ORDER — SODIUM CHLORIDE 0.9 % IV SOLN: Freq: Once | INTRAVENOUS | Status: DC

## 2012-11-27 NOTE — Progress Notes (Signed)
IV 22 g removed from left hand with tip intact per policy in planning for DC

## 2012-11-27 NOTE — H&P (Signed)
Referring Physician: Dr. Sondra Come HPI: Lori Ford is an 37 y.o. female with PMHx of left lower extremity DVT 06/14 after she developed a LLE infection secondary to dilantin injection from a seizure. Patient states she was experiencing LLE pain and swelling and was found to have DVT on 10/06/12 without any evidence of PE. She does admit to LLE pain and shortness of breath, but denies any chest pain. She previously was on coumadin with heavy vaginal bleeding and previously on xarelto with heavy vaginal bleeding. Patient is currently on Lovenox injections and states she ran out 2 days ago. She denies any difficulty taking lovenox. She denies any bleeding at this time. She denies any lower extremity edema at this time.   Past Medical History:  Past Medical History  Diagnosis Date  . 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 Seizures     due to head trauma as adult  . Cervical cancer     2013, untreated  . Asthma     as child  . Meningioma   . Gallstones     s/p cholecystectomy  . HTN (hypertension)   . GERD (gastroesophageal reflux disease)   . Allergic rhinitis   . Cigarette nicotine dependence with withdrawal   . DVT (deep venous thrombosis)     Past Surgical History:  Past Surgical History  Procedure Laterality Date  . Brain surgery      2013 to remove a meningioma  . Cholecystectomy      Family History:  Family History  Problem Relation Age of Onset  . Other Mother     varicose vein  . Deep vein thrombosis Neg Hx   . Pulmonary embolism Neg Hx   . Cancer    . Diabetes    . High blood pressure    . Asthma    . Thyroid disease      Social History:  reports that she has been smoking Cigarettes.  She has a 4 pack-year smoking history. She does not have any smokeless tobacco history on file. She reports that she uses illicit drugs (Marijuana). She reports that she does not drink alcohol.  Allergies:  Allergies  Allergen Reactions  . Ibuprofen Shortness Of Breath     wheezing  . Clindamycin/Lincomycin Hives  . Latex Other (See Comments)    Rash, wheezing  . Other Swelling    "GRAPES"  . Aspirin Palpitations    Medications:   Medication List    ASK your doctor about these medications       albuterol 108 (90 BASE) MCG/ACT inhaler  Commonly known as:  PROVENTIL HFA;VENTOLIN HFA  Inhale 2 puffs into the lungs every 6 (six) hours as needed for wheezing or shortness of breath.     beclomethasone 40 MCG/ACT inhaler  Commonly known as:  QVAR  Inhale 1 puff into the lungs 2 (two) times daily.     diazepam 10 MG tablet  Commonly known as:  VALIUM  Take 10 mg by mouth 2 (two) times daily.     diphenhydrAMINE 25 MG tablet  Commonly known as:  SOMINEX  Take 50 mg by mouth at bedtime as needed for sleep.     divalproex 500 MG DR tablet  Commonly known as:  DEPAKOTE  Take 500 mg by mouth 3 (three) times daily.     enoxaparin 100 MG/ML injection  Commonly known as:  LOVENOX  Inject 95 mg into the skin every 12 (twelve) hours.  fexofenadine 60 MG tablet  Commonly known as:  ALLEGRA  Take 60 mg by mouth daily.     fluticasone 50 MCG/ACT nasal spray  Commonly known as:  FLONASE  Place 1 spray into the nose 2 (two) times daily.     folic acid 1 MG tablet  Commonly known as:  FOLVITE  Take 1 mg by mouth daily.     gabapentin 800 MG tablet  Commonly known as:  NEURONTIN  Take 800 mg by mouth 2 (two) times daily.     hydrochlorothiazide 25 MG tablet  Commonly known as:  HYDRODIURIL  Take 25 mg by mouth daily.     HYDROcodone-acetaminophen 5-325 MG per tablet  Commonly known as:  NORCO/VICODIN  Take 1-2 tablets by mouth every 8 (eight) hours as needed for pain.     levETIRAcetam 1000 MG tablet  Commonly known as:  KEPPRA  Take 1,000 mg by mouth 3 (three) times daily.     loratadine-pseudoephedrine 10-240 MG per 24 hr tablet  Commonly known as:  CLARITIN-D 24-hour  Take 1 tablet by mouth daily.     omeprazole 40 MG capsule   Commonly known as:  PRILOSEC  Take 40 mg by mouth daily.     promethazine 25 MG tablet  Commonly known as:  PHENERGAN  Take 25 mg by mouth 2 (two) times daily as needed for nausea.     propranolol 10 MG tablet  Commonly known as:  INDERAL  Take 10 mg by mouth 2 (two) times daily.     sodium chloride 0.65 % nasal spray  Commonly known as:  OCEAN  Place 1 spray into the nose 2 (two) times daily as needed for congestion.        Please HPI for pertinent positives, otherwise complete 10 system ROS negative.  Physical Exam: BP 110/67  Pulse 74  Temp(Src) 99.3 F (37.4 C) (Oral)  Resp 18  Ht 5\' 5"  (1.651 m)  Wt 205 lb (92.987 kg)  BMI 34.11 kg/m2  SpO2 98%  LMP 11/02/2012 Body mass index is 34.11 kg/(m^2).   General Appearance:  Alert, cooperative, no distress, appears stated age  Head:  Normocephalic, without obvious abnormality, atraumatic  Lungs:   Clear to auscultation bilaterally, no w/r/r, respirations unlabored without use of accessory muscles.  Chest Wall:  No tenderness or deformity  Heart:  Regular rate and rhythm, S1, S2 normal, no murmur, rub or gallop.  Abdomen:   Soft, non-tender, non distended.  Extremities: Extremities normal, atraumatic, no cyanosis or edema  Pulses: 2+ and symmetric  Neurologic: Normal affect, no gross deficits.   Results for orders placed during the hospital encounter of 11/27/12 (from the past 48 hour(s))  BASIC METABOLIC PANEL     Status: None   Collection Time    11/27/12  8:05 AM      Result Value Range   Sodium 139  135 - 145 mEq/L   Potassium 3.5  3.5 - 5.1 mEq/L   Chloride 102  96 - 112 mEq/L   CO2 27  19 - 32 mEq/L   Glucose, Bld 93  70 - 99 mg/dL   BUN 11  6 - 23 mg/dL   Creatinine, Ser 1.61  0.50 - 1.10 mg/dL   Calcium 8.9  8.4 - 09.6 mg/dL   GFR calc non Af Amer >90  >90 mL/min   GFR calc Af Amer >90  >90 mL/min   Comment: (NOTE)     The eGFR has been calculated  using the CKD EPI equation.     This calculation has  not been validated in all clinical situations.     eGFR's persistently <90 mL/min signify possible Chronic Kidney     Disease.  CBC     Status: None   Collection Time    11/27/12  8:05 AM      Result Value Range   WBC 4.4  4.0 - 10.5 K/uL   RBC 4.09  3.87 - 5.11 MIL/uL   Hemoglobin 12.8  12.0 - 15.0 g/dL   HCT 16.1  09.6 - 04.5 %   MCV 92.4  78.0 - 100.0 fL   MCH 31.3  26.0 - 34.0 pg   MCHC 33.9  30.0 - 36.0 g/dL   RDW 40.9  81.1 - 91.4 %   Platelets 192  150 - 400 K/uL  PROTIME-INR     Status: None   Collection Time    11/27/12  8:05 AM      Result Value Range   Prothrombin Time 12.4  11.6 - 15.2 seconds   INR 0.94  0.00 - 1.49   No results found.  Assessment/Plan Left lower extremity DVT (popliteal and left greater saphenous) doppler on  10/06/12. CTA 10/06/12 no evidence of PE. Previously on coumadin with heavy vaginal bleeding. Previously on xarelto with heavy vaginal bleeding. Patient is currently on Lovenox injections, stopped 2 days ago secondary to running out of prescription that was given in ED. Patient here for requested retrievable IVC filter placement.  Labs reviewed. Case discussed with Dr. Richarda Overlie who has spoke with Dr. Sondra Come and feels that there is no indication at this time for filter placement. Patient is tolerating lovenox well and needs a prescription refill.  Patient was given one dose of lovenox here today. I have attempted to contact Dr. Lerry Liner the patients PCP and was unsuccessful. I educated the patient on the importance of contacting her pharmacy today and sending a refill request to her PCP office for lovenox prescription. She states she understands and will do so today.  Patient will be discharged today after lovenox injection.   Pattricia Boss D PA-C 11/27/2012, 9:36 AM

## 2012-11-27 NOTE — Progress Notes (Signed)
Pt stated will have to take a cab home, PA notified, ok but pt will not receive sedation.  Will need to call social worker for cab voucher at [phone]

## 2012-12-01 ENCOUNTER — Emergency Department (HOSPITAL_COMMUNITY)
Admission: EM | Admit: 2012-12-01 | Discharge: 2012-12-02 | Disposition: A | Discharge status: Home / Self Care | Payer: Medicaid - Out of State | Attending: Emergency Medicine | Admitting: Emergency Medicine

## 2012-12-01 ENCOUNTER — Encounter (HOSPITAL_COMMUNITY): Payer: Self-pay | Admitting: Emergency Medicine

## 2012-12-01 DIAGNOSIS — J45909 Unspecified asthma, uncomplicated: Secondary | ICD-10-CM | POA: Insufficient documentation

## 2012-12-01 DIAGNOSIS — I82402 Acute embolism and thrombosis of unspecified deep veins of left lower extremity: Secondary | ICD-10-CM

## 2012-12-01 DIAGNOSIS — C7982 Secondary malignant neoplasm of genital organs: Secondary | ICD-10-CM | POA: Insufficient documentation

## 2012-12-01 DIAGNOSIS — Z8719 Personal history of other diseases of the digestive system: Secondary | ICD-10-CM | POA: Insufficient documentation

## 2012-12-01 DIAGNOSIS — Z3202 Encounter for pregnancy test, result negative: Secondary | ICD-10-CM | POA: Insufficient documentation

## 2012-12-01 DIAGNOSIS — G40909 Epilepsy, unspecified, not intractable, without status epilepticus: Secondary | ICD-10-CM | POA: Insufficient documentation

## 2012-12-01 DIAGNOSIS — I10 Essential (primary) hypertension: Secondary | ICD-10-CM | POA: Insufficient documentation

## 2012-12-01 DIAGNOSIS — Z9119 Patient's noncompliance with other medical treatment and regimen: Secondary | ICD-10-CM | POA: Insufficient documentation

## 2012-12-01 DIAGNOSIS — IMO0002 Reserved for concepts with insufficient information to code with codable children: Secondary | ICD-10-CM | POA: Insufficient documentation

## 2012-12-01 DIAGNOSIS — N898 Other specified noninflammatory disorders of vagina: Secondary | ICD-10-CM | POA: Insufficient documentation

## 2012-12-01 DIAGNOSIS — Z91040 Latex allergy status: Secondary | ICD-10-CM | POA: Insufficient documentation

## 2012-12-01 DIAGNOSIS — Z85841 Personal history of malignant neoplasm of brain: Secondary | ICD-10-CM | POA: Insufficient documentation

## 2012-12-01 DIAGNOSIS — Z79899 Other long term (current) drug therapy: Secondary | ICD-10-CM | POA: Insufficient documentation

## 2012-12-01 DIAGNOSIS — F17200 Nicotine dependence, unspecified, uncomplicated: Secondary | ICD-10-CM | POA: Insufficient documentation

## 2012-12-01 DIAGNOSIS — Z91199 Patient's noncompliance with other medical treatment and regimen due to unspecified reason: Secondary | ICD-10-CM | POA: Insufficient documentation

## 2012-12-01 DIAGNOSIS — I82409 Acute embolism and thrombosis of unspecified deep veins of unspecified lower extremity: Secondary | ICD-10-CM | POA: Insufficient documentation

## 2012-12-01 DIAGNOSIS — K219 Gastro-esophageal reflux disease without esophagitis: Secondary | ICD-10-CM | POA: Insufficient documentation

## 2012-12-01 LAB — POCT PREGNANCY, URINE: Preg Test, Ur: NEGATIVE

## 2012-12-01 MED ORDER — ENOXAPARIN SODIUM 100 MG/ML ~~LOC~~ SOLN
95.0000 mg | Freq: Once | SUBCUTANEOUS | Status: AC
  Administered 2012-12-02: 95 mg via SUBCUTANEOUS
  Filled 2012-12-01: qty 1

## 2012-12-01 NOTE — ED Notes (Signed)
Pt called x2 for room placement w/no answer 

## 2012-12-01 NOTE — ED Provider Notes (Addendum)
CSN: [id number]     Arrival date & time 12/01/12  1951 History     First MD Initiated Contact with Patient 12/01/12 2320     Chief Complaint  Patient presents with  . Leg Pain   (Consider location/radiation/quality/duration/timing/severity/associated sxs/prior Treatment) Patient is a 37 y.o. female presenting with leg pain.  Leg Pain  Pt with history of DVT in LLE had been on Lovenox off and on for about 2 months, noncompliant with medication regimens, she was also initially on Xarelto and then Coumadin but stopped due to persistent vaginal bleeding which has been ongoing for several weeks also seen in ED for same previously. She was apparently scheduled for  IVC filter to be placed 4 days ago but she states she was told she couldn't have it because she is pregnant. She states she was not given any further medications. She is here today for persistent mild aching pain in LLE but no CP or SOB.   Past Medical History  Diagnosis Date  . 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 Seizures     due to head trauma as adult  . Cervical cancer     2013, untreated  . Asthma     as child  . Meningioma   . Gallstones     s/p cholecystectomy  . HTN (hypertension)   . GERD (gastroesophageal reflux disease)   . Allergic rhinitis   . Cigarette nicotine dependence with withdrawal   . DVT (deep venous thrombosis)    Past Surgical History  Procedure Laterality Date  . Brain surgery      2013 to remove a meningioma  . Cholecystectomy     Family History  Problem Relation Age of Onset  . Other Mother     varicose vein  . Deep vein thrombosis Neg Hx   . Pulmonary embolism Neg Hx   . Cancer    . Diabetes    . High blood pressure    . Asthma    . Thyroid disease     History  Substance Use Topics  . Smoking status: Current Every Day Smoker -- 0.25 packs/day for 16 years    Types: Cigarettes  . Smokeless tobacco: Not on file  . Alcohol Use: No   OB History   Grav Para Term Preterm  Abortions TAB SAB Ect Mult Living                 Review of Systems All other systems reviewed and are negative except as noted in HPI.   Allergies  Ibuprofen; Clindamycin/lincomycin; Latex; Other; and Aspirin  Home Medications   Current Outpatient Rx  Name  Route  Sig  Dispense  Refill  . albuterol (PROVENTIL HFA;VENTOLIN HFA) 108 (90 BASE) MCG/ACT inhaler   Inhalation   Inhale 2 puffs into the lungs every 6 (six) hours as needed for wheezing or shortness of breath.          . beclomethasone (QVAR) 40 MCG/ACT inhaler   Inhalation   Inhale 1 puff into the lungs 2 (two) times daily.         . diazepam (VALIUM) 10 MG tablet   Oral   Take 10 mg by mouth 2 (two) times daily.         . diphenhydrAMINE (SOMINEX) 25 MG tablet   Oral   Take 50 mg by mouth at bedtime as needed for sleep.          . divalproex (  DEPAKOTE) 500 MG DR tablet   Oral   Take 500 mg by mouth 3 (three) times daily.         Marland Kitchen enoxaparin (LOVENOX) 100 MG/ML injection   Subcutaneous   Inject 95 mg into the skin every 12 (twelve) hours.         . fexofenadine (ALLEGRA) 60 MG tablet   Oral   Take 60 mg by mouth daily.         . fluticasone (FLONASE) 50 MCG/ACT nasal spray   Nasal   Place 1 spray into the nose 2 (two) times daily.         . folic acid (FOLVITE) 1 MG tablet   Oral   Take 1 mg by mouth daily.         Marland Kitchen gabapentin (NEURONTIN) 800 MG tablet   Oral   Take 800 mg by mouth 2 (two) times daily.         . hydrochlorothiazide (HYDRODIURIL) 25 MG tablet   Oral   Take 25 mg by mouth daily.         Marland Kitchen HYDROcodone-acetaminophen (NORCO/VICODIN) 5-325 MG per tablet   Oral   Take 1-2 tablets by mouth every 8 (eight) hours as needed for pain.   20 tablet   0   . levETIRAcetam (KEPPRA) 1000 MG tablet   Oral   Take 1,000 mg by mouth 3 (three) times daily.         Marland Kitchen loratadine-pseudoephedrine (CLARITIN-D 24-HOUR) 10-240 MG per 24 hr tablet   Oral   Take 1 tablet by  mouth daily.         Marland Kitchen omeprazole (PRILOSEC) 40 MG capsule   Oral   Take 40 mg by mouth daily.         . promethazine (PHENERGAN) 25 MG tablet   Oral   Take 25 mg by mouth 2 (two) times daily as needed for nausea.         . propranolol (INDERAL) 10 MG tablet   Oral   Take 10 mg by mouth 2 (two) times daily.         . sodium chloride (OCEAN) 0.65 % nasal spray   Nasal   Place 1 spray into the nose 2 (two) times daily as needed for congestion.           BP 119/66  Pulse 83  Temp(Src) 98.7 F (37.1 C) (Oral)  Resp 14  SpO2 97%  LMP 11/02/2012 Physical Exam  Nursing note and vitals reviewed. Constitutional: She is oriented to person, place, and time. She appears well-developed and well-nourished.  HENT:  Head: Normocephalic and atraumatic.  Eyes: EOM are normal. Pupils are equal, round, and reactive to light.  Neck: Normal range of motion. Neck supple.  Cardiovascular: Normal rate, normal heart sounds and intact distal pulses.   Pulmonary/Chest: Effort normal and breath sounds normal.  Abdominal: Bowel sounds are normal. She exhibits no distension. There is no tenderness.  Musculoskeletal: Normal range of motion. She exhibits no edema and no tenderness.  Neurological: She is alert and oriented to person, place, and time. She has normal strength. No cranial nerve deficit or sensory deficit.  Skin: Skin is warm and dry. No rash noted.  Psychiatric: She has a normal mood and affect.    ED Course   Procedures (including critical care time)  Labs Reviewed  POCT PREGNANCY, URINE   No results found. 1. DVT (deep venous thrombosis), left     MDM  Reviewed available EMR including notes from IVC filter appointment on 8/15, there is no mention of pregnancy or any documented pregnancy tests. IVC filter was deferred after discussion between IR MD and Dr. Sondra Come (?vein specialist) who felt filter was not indicated. Pt was advised to see her PCP for Lovenox refill but  states she did not get that medication. Preg here neg. No signs of PE.Will give a single injection of Lovenox here, Rx for 1 week. Advised PCP followup in 2-3 days for long term management. Gyn followup for persistent vaginal bleeding.   Shelah Heatley B. Bernette Mayers, MD 12/01/12 2359  Called back to patient room. She is upset that we are not doing an ultrasound tonight to 'see if the clot has moved'. I advised her multiple times that venous doppler studies are not available at night at which time she accused me of lying and 'not caring'. Also advised that treatment would not be changed based on [place] results and that she needs to continue Lovenox including following with her PCP for long term RX and not to run out of her medications again. She is asking for narcotic pain medications which are not indicated. Advised she can take APAP if needed and PCP can address her long term chronic pain from his clinic. Pt was given discharge papers including Lovenox Rx which she tore up and threw in the trashcan. She was advised that not getting her Rx and continuing Lovenox uninterrupted was potentially dangerous and could lead to PE, long term disability or death. She slammed the door in my face. I have asked the nurse to have security escort her out.   Atari Novick B. Bernette Mayers, MD 12/02/12 1610

## 2012-12-01 NOTE — ED Notes (Signed)
No response from waiting room x 1

## 2012-12-01 NOTE — ED Notes (Signed)
PT. REPORTS LEFT LOWER LEG PAIN / SWELLING SINCE June 2014 , DID NOT TAKE HER LOVENOX FOR 2 WEEKS FOR DVT , DENIES SOB , ALSO REPORTS INTERMITTENT VAGINAL BLEEDING FOR FOR SEVERAL WEEKS .

## 2012-12-02 ENCOUNTER — Encounter (HOSPITAL_COMMUNITY): Payer: Self-pay | Admitting: Emergency Medicine

## 2012-12-02 ENCOUNTER — Emergency Department (HOSPITAL_COMMUNITY)
Admission: EM | Admit: 2012-12-02 | Discharge: 2012-12-03 | Disposition: A | Discharge status: Home / Self Care | Payer: Medicaid - Out of State | Attending: Emergency Medicine | Admitting: Emergency Medicine

## 2012-12-02 DIAGNOSIS — Z8709 Personal history of other diseases of the respiratory system: Secondary | ICD-10-CM | POA: Insufficient documentation

## 2012-12-02 DIAGNOSIS — F17200 Nicotine dependence, unspecified, uncomplicated: Secondary | ICD-10-CM | POA: Insufficient documentation

## 2012-12-02 DIAGNOSIS — Z8719 Personal history of other diseases of the digestive system: Secondary | ICD-10-CM | POA: Insufficient documentation

## 2012-12-02 DIAGNOSIS — IMO0001 Reserved for inherently not codable concepts without codable children: Secondary | ICD-10-CM | POA: Insufficient documentation

## 2012-12-02 DIAGNOSIS — K219 Gastro-esophageal reflux disease without esophagitis: Secondary | ICD-10-CM | POA: Insufficient documentation

## 2012-12-02 DIAGNOSIS — I82402 Acute embolism and thrombosis of unspecified deep veins of left lower extremity: Secondary | ICD-10-CM

## 2012-12-02 DIAGNOSIS — G40309 Generalized idiopathic epilepsy and epileptic syndromes, not intractable, without status epilepticus: Secondary | ICD-10-CM | POA: Insufficient documentation

## 2012-12-02 DIAGNOSIS — Z8661 Personal history of infections of the central nervous system: Secondary | ICD-10-CM | POA: Insufficient documentation

## 2012-12-02 DIAGNOSIS — I82409 Acute embolism and thrombosis of unspecified deep veins of unspecified lower extremity: Secondary | ICD-10-CM | POA: Insufficient documentation

## 2012-12-02 DIAGNOSIS — I10 Essential (primary) hypertension: Secondary | ICD-10-CM | POA: Insufficient documentation

## 2012-12-02 DIAGNOSIS — Z79899 Other long term (current) drug therapy: Secondary | ICD-10-CM | POA: Insufficient documentation

## 2012-12-02 DIAGNOSIS — J45909 Unspecified asthma, uncomplicated: Secondary | ICD-10-CM | POA: Insufficient documentation

## 2012-12-02 DIAGNOSIS — Z91040 Latex allergy status: Secondary | ICD-10-CM | POA: Insufficient documentation

## 2012-12-02 DIAGNOSIS — IMO0002 Reserved for concepts with insufficient information to code with codable children: Secondary | ICD-10-CM | POA: Insufficient documentation

## 2012-12-02 DIAGNOSIS — Z7901 Long term (current) use of anticoagulants: Secondary | ICD-10-CM | POA: Insufficient documentation

## 2012-12-02 DIAGNOSIS — Z8541 Personal history of malignant neoplasm of cervix uteri: Secondary | ICD-10-CM | POA: Insufficient documentation

## 2012-12-02 MED ORDER — ENOXAPARIN SODIUM 100 MG/ML ~~LOC~~ SOLN: 95.0000 mg | Freq: Once | SUBCUTANEOUS | Status: DC

## 2012-12-02 MED ORDER — ENOXAPARIN SODIUM 100 MG/ML ~~LOC~~ SOLN
95.0000 mg | Freq: Once | SUBCUTANEOUS | Status: AC
  Administered 2012-12-02: 95 mg via SUBCUTANEOUS
  Filled 2012-12-02: qty 1

## 2012-12-02 MED ORDER — ACETAMINOPHEN 325 MG PO TABS
650.0000 mg | ORAL_TABLET | Freq: Once | ORAL | Status: DC
  Filled 2012-12-02: qty 2

## 2012-12-02 MED ORDER — HYDROCODONE-ACETAMINOPHEN 5-325 MG PO TABS
1.0000 | ORAL_TABLET | Freq: Once | ORAL | Status: AC
  Administered 2012-12-02: 1 via ORAL
  Filled 2012-12-02: qty 1

## 2012-12-02 NOTE — ED Notes (Signed)
PT. REPORTS PERSISTENT LEFT LOWER LEG PAIN , SEEN HERE YESTERDAY FOR THE SAME COMPLAINT DIAGNOSED WITH DVT , DENIES SOB OR CHEST PAIN .

## 2012-12-02 NOTE — ED Notes (Signed)
Upon entering the pts room to review d/c instructions pt began repeatedly questioning why she was not being d/c with narcotic pain medication. Dr. Bernette Mayers and myself explained the risks and benefits of narcotic pain medication and why she was not being d/c with narcotics. At which time patient became verbally abusive and began swearing and cursing loudly. While attempting to obtain a final set of vitals pt removed blood pressure cuff and pulse ox and tossed them to the floor and refused further vitals. Upon attempting to review the d/c instructions including her prescription for Lovenox the patient removed the papers from the nurses hands and ripped the papers into many small pieces. EDP attempted to speak with pt and offered her another prescription for the Lovenox however the pt adamantly refused and again became verbally abusive. Security paged escorted pt out of the pod.

## 2012-12-02 NOTE — ED Provider Notes (Signed)
CSN: [id number]     Arrival date & time 12/02/12  2118 History     First MD Initiated Contact with Patient 12/02/12 2300     Chief Complaint  Patient presents with  . Leg Pain   (Consider location/radiation/quality/duration/timing/severity/associated sxs/prior Treatment) The history is provided by the patient. No language interpreter was used.  Lori Ford is a 37 y/o F with PMHx of DVT, seizures, HTN, gallstones, presenting to the ED with left leg pain that has been ongoing since June 2014. Patient reported that the pain is localized to the posterior and anterior aspect of the left leg, described as a constant throbbing, stabbing pain with radiation down to the left foot. Patient reported that the pain is worse when she stands and walks. Reported that she has history of DVT to the LLE, reported that she used to be on Lovenox, stated that she has not taken her Lovenox in approximately 2 weeks. Stated that she is followed at [facility] where she was seen by Dr,. Cruz and was due to get a filter placed 4 days ago, but was reported to be pregnant, but found out yesterday when seen in the ED for the same complaint that she was not pregnant. Denied numbness, tingling, fall, injury, chest pain, shortness of breath, difficulty breathing.  PCP Dr. Lerry Liner Has an appointment with Dr. Caroline Sauger next week, as per patient.    Past Medical History  Diagnosis Date  . 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 Seizures     due to head trauma as adult  . Cervical cancer     2013, untreated  . Asthma     as child  . Meningioma   . Gallstones     s/p cholecystectomy  . HTN (hypertension)   . GERD (gastroesophageal reflux disease)   . Allergic rhinitis   . Cigarette nicotine dependence with withdrawal   . DVT (deep venous thrombosis)    Past Surgical History  Procedure Laterality Date  . Brain surgery      2013 to remove a meningioma  . Cholecystectomy     Family History   Problem Relation Age of Onset  . Other Mother     varicose vein  . Deep vein thrombosis Neg Hx   . Pulmonary embolism Neg Hx   . Cancer    . Diabetes    . High blood pressure    . Asthma    . Thyroid disease     History  Substance Use Topics  . Smoking status: Current Every Day Smoker -- 0.25 packs/day for 16 years    Types: Cigarettes  . Smokeless tobacco: Not on file  . Alcohol Use: No   OB History   Grav Para Term Preterm Abortions TAB SAB Ect Mult Living                 Review of Systems  Constitutional: Negative for fever and chills.  Eyes: Negative for visual disturbance.  Respiratory: Negative for cough, chest tightness and shortness of breath.   Cardiovascular: Negative for chest pain.  Musculoskeletal: Positive for myalgias (left leg).  Skin: Negative for color change.  Neurological: Negative for weakness and numbness.  All other systems reviewed and are negative.    Allergies  Ibuprofen; Clindamycin/lincomycin; Latex; Other; and Aspirin  Home Medications   Current Outpatient Rx  Name  Route  Sig  Dispense  Refill  . albuterol (PROVENTIL HFA;VENTOLIN HFA) 108 (90 BASE)  MCG/ACT inhaler   Inhalation   Inhale 2 puffs into the lungs every 6 (six) hours as needed for wheezing or shortness of breath.          . beclomethasone (QVAR) 40 MCG/ACT inhaler   Inhalation   Inhale 1 puff into the lungs 2 (two) times daily.         . diazepam (VALIUM) 10 MG tablet   Oral   Take 10 mg by mouth 2 (two) times daily.         . diphenhydrAMINE (SOMINEX) 25 MG tablet   Oral   Take 50 mg by mouth at bedtime as needed for sleep.          . divalproex (DEPAKOTE) 500 MG DR tablet   Oral   Take 500 mg by mouth 3 (three) times daily.         Marland Kitchen enoxaparin (LOVENOX) 100 MG/ML injection   Subcutaneous   Inject 95 mg into the skin every 12 (twelve) hours.         . fexofenadine (ALLEGRA) 60 MG tablet   Oral   Take 60 mg by mouth daily.         .  fluticasone (FLONASE) 50 MCG/ACT nasal spray   Nasal   Place 1 spray into the nose 2 (two) times daily.         . folic acid (FOLVITE) 1 MG tablet   Oral   Take 1 mg by mouth daily.         Marland Kitchen gabapentin (NEURONTIN) 800 MG tablet   Oral   Take 800 mg by mouth 2 (two) times daily.         . hydrochlorothiazide (HYDRODIURIL) 25 MG tablet   Oral   Take 25 mg by mouth daily.         Marland Kitchen HYDROcodone-acetaminophen (NORCO/VICODIN) 5-325 MG per tablet   Oral   Take 1-2 tablets by mouth every 8 (eight) hours as needed for pain.   20 tablet   0   . levETIRAcetam (KEPPRA) 1000 MG tablet   Oral   Take 1,000 mg by mouth 3 (three) times daily.         Marland Kitchen loratadine-pseudoephedrine (CLARITIN-D 24-HOUR) 10-240 MG per 24 hr tablet   Oral   Take 1 tablet by mouth daily.         Marland Kitchen omeprazole (PRILOSEC) 40 MG capsule   Oral   Take 40 mg by mouth daily.         . promethazine (PHENERGAN) 25 MG tablet   Oral   Take 25 mg by mouth 2 (two) times daily as needed for nausea.         . propranolol (INDERAL) 10 MG tablet   Oral   Take 10 mg by mouth 2 (two) times daily.         . sodium chloride (OCEAN) 0.65 % nasal spray   Nasal   Place 1 spray into the nose 2 (two) times daily as needed for congestion.          . enoxaparin (LOVENOX) 100 MG/ML injection   Subcutaneous   Inject 0.95 mLs (95 mg total) into the skin every 12 (twelve) hours.   20 mL   0   . HYDROcodone-acetaminophen (NORCO/VICODIN) 5-325 MG per tablet   Oral   Take 1 tablet by mouth every 8 (eight) hours as needed for pain.   5 tablet   0    BP 110/64  Pulse 68  Temp(Src) 98.5 F (36.9 C) (Oral)  Resp 16  SpO2 99%  LMP 11/02/2012 Physical Exam  Nursing note and vitals reviewed. Constitutional: She is oriented to person, place, and time. She appears well-developed and well-nourished. No distress.  HENT:  Head: Normocephalic and atraumatic.  Eyes: Conjunctivae and EOM are normal. Pupils are  equal, round, and reactive to light. Right eye exhibits no discharge. Left eye exhibits no discharge.  Neck: Normal range of motion. Neck supple.  Cardiovascular: Normal rate, regular rhythm and normal heart sounds.  Exam reveals no friction rub.   No murmur heard. Pulses:      Radial pulses are 2+ on the right side, and 2+ on the left side.       Dorsalis pedis pulses are 2+ on the right side, and 2+ on the left side.  Negative swelling noted to the ankles and legs bilaterally Negative pitting edema noted bilaterally  Pulmonary/Chest: Effort normal and breath sounds normal. No respiratory distress. She has no wheezes. She has no rales. She exhibits no tenderness.  Musculoskeletal: Normal range of motion.  Full ROM to the lower extremities bilaterally - discomfort noted to the left leg, anterior and posterior aspect, upon palpation - pain upon flexion of the left knee secondary to calf pain.   Lymphadenopathy:    She has no cervical adenopathy.  Neurological: She is alert and oriented to person, place, and time. She exhibits normal muscle tone. Coordination normal.  Skin: Skin is warm and dry. No rash noted. She is not diaphoretic. No erythema.  Psychiatric: She has a normal mood and affect. Her behavior is normal. Thought content normal.    ED Course   Procedures (including critical care time)  Review of patient's chart from yesterday, 12/01/2012. Patient was seen for same complaint of left leg pain, was treated for DVT with Lovenox. Patient reported that she was due to get a IVC filter placed approximately 4 days ago bites the surgery did not occur due to patient being pregnant. Urine pregnancy test performed yesterday, 12/01/2012 which came back negative. Patient became agitated and verbally aggressive upon discharge to to not being discharged with narcotic medications.  11:25 PM patient assessed and interviewed  11:30 PM patient requesting pain medications  12/03/2012 12:40 AM  discussed case with Dr. Norlene Campbell, recommended that patient to be discharged with Lovenox and small dose of pain medications to be given.  12:50AM discussed plan of discharge with patient. Educated patient on symptoms of pulmonary embolism. Stressed importance of using Lovenox and following up with vascular surgeon, Dr. Sondra Come regarding the left leg pain DVT. Patient reported that she has an appointment with Dr. Sondra Come on 12/07/2012, reported that she is scheduled to get an ultrasound of the lower left extremity on that day-stressed importance of keeping that appointment, patient understood and agreed.  Labs Reviewed - No data to display No results found. 1. DVT (deep venous thrombosis), left     MDM  Patient presenting to the emergency department with left leg pain has been ongoing since June 2014-patient was seen here yesterday regarding left leg pain, was treated with Lovenox in ED setting. Patient made a scene due to not getting narcotics at discharge.  Alert, oriented. Lungs clear to auscultation, negative chest wall tenderness. Heart rate and rhythm normal. Full range of motion to lower extremities bilaterally. Negative leg and ankle swelling noted. Negative pitting edema bilaterally. Pulses palpable bilaterally. Discomfort noted upon palpation to the left calf.  Patient afebrile, not tachycardic, non-tachypneic, pulse  ox adequate on room air (99%) - denied chest pain, shortness of breath, difficulty breathing-doubt PE. Patient discharged with Lovenox and small dose of pain medications-discussed with patient course, proportions, disposal techniques. Stressed importance of patient to followup primary care provider regarding ongoing left leg pain. Stressed importance of patient following up with [facility] vascular surgery, Dr. Sondra Come, regarding ongoing left leg pain-recommended patient to get ultrasound performed of the left lower extremity since the last ultrasound was performed since July 2014.  Discussed with patient the importance of taking Lovenox regarding pulmonary embolism, since patient to rest and she has clots, discussed the signs symptoms and was watch out for regarding pulmonary embolism, as well as the fetal consequences. Discussed with patient to rest and stay hydrated. Discussed with patient to continue to monitor symptoms and if symptoms are to worsen or change to report back to emergency department-strict return instructions given.  Patient agreed to plan of care, understood, all questions answered.  [employer], PA-C 12/03/12 0730

## 2012-12-03 MED ORDER — HYDROCODONE-ACETAMINOPHEN 5-325 MG PO TABS: 1.0000 | ORAL_TABLET | Freq: Three times a day (TID) | ORAL | Status: DC | PRN

## 2012-12-03 MED ORDER — ENOXAPARIN SODIUM 100 MG/ML ~~LOC~~ SOLN: 95.0000 mg | Freq: Two times a day (BID) | SUBCUTANEOUS | Status: DC

## 2012-12-03 NOTE — ED Provider Notes (Signed)
Medical screening examination/treatment/procedure(s) were performed by non-physician practitioner and as supervising physician I was immediately available for consultation/collaboration.  Kaden Dunkel M Rosselyn Martha, MD 12/03/12 0747 

## 2012-12-07 ENCOUNTER — Encounter: Payer: Self-pay | Admitting: Neurology

## 2012-12-08 ENCOUNTER — Ambulatory Visit (INDEPENDENT_AMBULATORY_CARE_PROVIDER_SITE_OTHER): Payer: Medicaid Other | Admitting: Neurology

## 2012-12-08 ENCOUNTER — Encounter: Payer: Self-pay | Admitting: Neurology

## 2012-12-08 VITALS — BP 108/72 | HR 84 | Ht 65.0 in | Wt 210.0 lb

## 2012-12-08 DIAGNOSIS — G40409 Other generalized epilepsy and epileptic syndromes, not intractable, without status epilepticus: Secondary | ICD-10-CM

## 2012-12-08 DIAGNOSIS — G40309 Generalized idiopathic epilepsy and epileptic syndromes, not intractable, without status epilepticus: Secondary | ICD-10-CM

## 2012-12-08 MED ORDER — DIVALPROEX SODIUM 500 MG PO DR TAB: 500.0000 mg | DELAYED_RELEASE_TABLET | Freq: Three times a day (TID) | ORAL | Status: DC

## 2012-12-08 MED ORDER — GABAPENTIN 800 MG PO TABS: 800.0000 mg | ORAL_TABLET | Freq: Two times a day (BID) | ORAL | Status: DC
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
## 2012-12-10 ENCOUNTER — Other Ambulatory Visit: Payer: Self-pay

## 2012-12-10 MED ORDER — GABAPENTIN 400 MG PO CAPS: 800.0000 mg | ORAL_CAPSULE | Freq: Two times a day (BID) | ORAL | Status: DC

## 2012-12-10 NOTE — Telephone Encounter (Signed)
Patient has Medicaid, which will not cover Gabapentin Tablets, they will only cover Capsules.  Updated Rx to capsules and sent it to the pharmacy.

## 2012-12-22 ENCOUNTER — Other Ambulatory Visit: Payer: Medicaid Other | Admitting: Radiology

## 2012-12-24 ENCOUNTER — Other Ambulatory Visit: Payer: Medicaid Other

## 2013-01-09 ENCOUNTER — Encounter (HOSPITAL_COMMUNITY): Payer: Self-pay | Admitting: *Deleted

## 2013-01-09 ENCOUNTER — Emergency Department (HOSPITAL_COMMUNITY): Payer: Medicaid - Out of State

## 2013-01-09 ENCOUNTER — Emergency Department (HOSPITAL_COMMUNITY)
Admission: EM | Admit: 2013-01-09 | Discharge: 2013-01-09 | Disposition: A | Discharge status: Home / Self Care | Payer: Medicaid - Out of State | Attending: Emergency Medicine | Admitting: Emergency Medicine

## 2013-01-09 DIAGNOSIS — Z91040 Latex allergy status: Secondary | ICD-10-CM | POA: Insufficient documentation

## 2013-01-09 DIAGNOSIS — Z86718 Personal history of other venous thrombosis and embolism: Secondary | ICD-10-CM | POA: Insufficient documentation

## 2013-01-09 DIAGNOSIS — Z8669 Personal history of other diseases of the nervous system and sense organs: Secondary | ICD-10-CM | POA: Insufficient documentation

## 2013-01-09 DIAGNOSIS — F17200 Nicotine dependence, unspecified, uncomplicated: Secondary | ICD-10-CM | POA: Insufficient documentation

## 2013-01-09 DIAGNOSIS — Z79899 Other long term (current) drug therapy: Secondary | ICD-10-CM | POA: Insufficient documentation

## 2013-01-09 DIAGNOSIS — Z87828 Personal history of other (healed) physical injury and trauma: Secondary | ICD-10-CM | POA: Insufficient documentation

## 2013-01-09 DIAGNOSIS — Z8541 Personal history of malignant neoplasm of cervix uteri: Secondary | ICD-10-CM | POA: Insufficient documentation

## 2013-01-09 DIAGNOSIS — Z3202 Encounter for pregnancy test, result negative: Secondary | ICD-10-CM | POA: Insufficient documentation

## 2013-01-09 DIAGNOSIS — R52 Pain, unspecified: Secondary | ICD-10-CM

## 2013-01-09 DIAGNOSIS — IMO0002 Reserved for concepts with insufficient information to code with codable children: Secondary | ICD-10-CM | POA: Insufficient documentation

## 2013-01-09 DIAGNOSIS — Z9889 Other specified postprocedural states: Secondary | ICD-10-CM | POA: Insufficient documentation

## 2013-01-09 DIAGNOSIS — K219 Gastro-esophageal reflux disease without esophagitis: Secondary | ICD-10-CM | POA: Insufficient documentation

## 2013-01-09 DIAGNOSIS — Z21 Asymptomatic human immunodeficiency virus [HIV] infection status: Secondary | ICD-10-CM | POA: Insufficient documentation

## 2013-01-09 DIAGNOSIS — J45909 Unspecified asthma, uncomplicated: Secondary | ICD-10-CM | POA: Insufficient documentation

## 2013-01-09 DIAGNOSIS — R42 Dizziness and giddiness: Secondary | ICD-10-CM | POA: Insufficient documentation

## 2013-01-09 DIAGNOSIS — I10 Essential (primary) hypertension: Secondary | ICD-10-CM | POA: Insufficient documentation

## 2013-01-09 DIAGNOSIS — G40309 Generalized idiopathic epilepsy and epileptic syndromes, not intractable, without status epilepticus: Secondary | ICD-10-CM | POA: Insufficient documentation

## 2013-01-09 LAB — URINALYSIS, ROUTINE W REFLEX MICROSCOPIC
Bilirubin Urine: NEGATIVE
Ketones, ur: NEGATIVE mg/dL
Nitrite: NEGATIVE
Protein, ur: NEGATIVE mg/dL
Urobilinogen, UA: 1 mg/dL (ref 0.0–1.0)

## 2013-01-09 LAB — PREGNANCY, URINE: Preg Test, Ur: NEGATIVE

## 2013-01-09 LAB — POCT I-STAT, CHEM 8
BUN: 7 mg/dL (ref 6–23)
Creatinine, Ser: 0.8 mg/dL (ref 0.50–1.10)
Glucose, Bld: 110 mg/dL — ABNORMAL HIGH (ref 70–99)
Hemoglobin: 12.9 g/dL (ref 12.0–15.0)
TCO2: 28 mmol/L (ref 0–100)

## 2013-01-09 LAB — CBC WITH DIFFERENTIAL/PLATELET
Basophils Absolute: 0 10*3/uL (ref 0.0–0.1)
Basophils Relative: 1 % (ref 0–1)
Eosinophils Absolute: 0.1 10*3/uL (ref 0.0–0.7)
Eosinophils Relative: 2 % (ref 0–5)
Lymphocytes Relative: 50 % — ABNORMAL HIGH (ref 12–46)
MCH: 31.6 pg (ref 26.0–34.0)
MCHC: 33.5 g/dL (ref 30.0–36.0)
MCV: 94.1 fL (ref 78.0–100.0)
Platelets: 165 10*3/uL (ref 150–400)
RDW: 13.7 % (ref 11.5–15.5)
WBC: 4.4 10*3/uL (ref 4.0–10.5)

## 2013-01-09 MED ORDER — SODIUM CHLORIDE 0.9 % IV BOLUS (SEPSIS)
500.0000 mL | Freq: Once | INTRAVENOUS | Status: AC
  Administered 2013-01-09: 500 mL via INTRAVENOUS

## 2013-01-09 MED ORDER — IOHEXOL 300 MG/ML  SOLN
100.0000 mL | Freq: Once | INTRAMUSCULAR | Status: AC | PRN
  Administered 2013-01-09: 100 mL via INTRAVENOUS

## 2013-01-09 MED ORDER — MORPHINE SULFATE 4 MG/ML IJ SOLN
4.0000 mg | Freq: Once | INTRAMUSCULAR | Status: AC
  Administered 2013-01-09: 4 mg via INTRAVENOUS
  Filled 2013-01-09: qty 1

## 2013-01-09 NOTE — ED Notes (Signed)
Pt reports having filter placed wed at [facility], due to multiple blood clots left leg. Now having generalized pain to entire body since the procedure. No acute distress noted at triage.

## 2013-01-09 NOTE — ED Provider Notes (Signed)
Patient's CT scan was reviewed.  Its normal.  Her IVC filter is in place proceeding with Dr. Rubin Payor, splint.  Patient has been discharged home with out prescriptions to follow up with her primary care physician  Arman Filter, NP 01/09/13 2137

## 2013-01-09 NOTE — ED Provider Notes (Signed)
CSN: [id number]     Arrival date & time 01/09/13  1625 History   First MD Initiated Contact with Patient 01/09/13 1825     Chief Complaint  Patient presents with  . Pain   (Consider location/radiation/quality/duration/timing/severity/associated sxs/prior Treatment) The history is provided by the patient.   patient states that she has pain everywhere on her body. She states that on Wednesday she got an IVC filter at [employer]. She states she's had pain everywhere since. She states she told the doctors and told him she hasn't had to take more of the pain medicines and she was given. She states that it hurts in her abdomen and back, but also hurts in her arms and legs. No fevers. She states she feels a little lightheaded.  Past Medical History  Diagnosis Date  . 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 Seizures     due to head trauma as adult  . Cervical cancer     2013, untreated  . Asthma     as child  . Meningioma   . Gallstones     s/p cholecystectomy  . HTN (hypertension)   . GERD (gastroesophageal reflux disease)   . Allergic rhinitis   . Cigarette nicotine dependence with withdrawal   . DVT (deep venous thrombosis)   . HIV (human immunodeficiency virus infection)    Past Surgical History  Procedure Laterality Date  . Brain surgery      2013 to remove a meningioma  . Cholecystectomy     Family History  Problem Relation Age of Onset  . Other Mother     varicose vein  . Asthma Mother   . High blood pressure Mother   . Deep vein thrombosis Neg Hx   . Pulmonary embolism Neg Hx   . Cancer    . Diabetes    . High blood pressure    . Asthma    . Thyroid disease    . Cancer Father    History  Substance Use Topics  . Smoking status: Current Every Day Smoker -- 0.25 packs/day for 16 years    Types: Cigarettes  . Smokeless tobacco: Never Used  . Alcohol Use: No     Comment: Smokes 3 joints a day   OB History   Grav Para Term Preterm Abortions TAB SAB Ect Mult  Living                 Review of Systems  Constitutional: Negative for activity change, appetite change and fatigue.  HENT: Positive for neck pain. Negative for neck stiffness.   Eyes: Negative for pain.  Respiratory: Negative for chest tightness and shortness of breath.   Cardiovascular: Positive for chest pain. Negative for leg swelling.  Gastrointestinal: Positive for abdominal pain. Negative for nausea, vomiting and diarrhea.  Genitourinary: Positive for flank pain.  Musculoskeletal: Positive for back pain.  Skin: Negative for rash.  Neurological: Positive for light-headedness. Negative for weakness, numbness and headaches.  Psychiatric/Behavioral: Negative for behavioral problems.    Allergies  Ibuprofen; Clindamycin/lincomycin; Latex; Other; and Aspirin  Home Medications   Current Outpatient Rx  Name  Route  Sig  Dispense  Refill  . albuterol (PROVENTIL HFA;VENTOLIN HFA) 108 (90 BASE) MCG/ACT inhaler   Inhalation   Inhale 2 puffs into the lungs every 6 (six) hours as needed for wheezing or shortness of breath.          . beclomethasone (QVAR) 40 MCG/ACT inhaler  Inhalation   Inhale 1 puff into the lungs 2 (two) times daily.         . diazepam (VALIUM) 10 MG tablet   Oral   Take 10 mg by mouth 2 (two) times daily.         . diphenhydrAMINE (SOMINEX) 25 MG tablet   Oral   Take 50 mg by mouth at bedtime as needed for sleep.          . divalproex (DEPAKOTE) 500 MG DR tablet   Oral   Take 1 tablet (500 mg total) by mouth 3 (three) times daily.   90 tablet   5   . enoxaparin (LOVENOX) 100 MG/ML injection   Subcutaneous   Inject 95 mg into the skin every 12 (twelve) hours.         . fexofenadine (ALLEGRA) 60 MG tablet   Oral   Take 60 mg by mouth daily.         . fluticasone (FLONASE) 50 MCG/ACT nasal spray   Nasal   Place 1 spray into the nose 2 (two) times daily.         . folic acid (FOLVITE) 1 MG tablet   Oral   Take 1 mg by mouth  daily.         Marland Kitchen gabapentin (NEURONTIN) 400 MG capsule   Oral   Take 2 capsules (800 mg total) by mouth 2 (two) times daily.   120 capsule   6   . hydrochlorothiazide (HYDRODIURIL) 25 MG tablet   Oral   Take 25 mg by mouth daily.         Marland Kitchen HYDROcodone-acetaminophen (NORCO/VICODIN) 5-325 MG per tablet   Oral   Take 1-2 tablets by mouth every 8 (eight) hours as needed for pain.   20 tablet   0   . levETIRAcetam (KEPPRA) 1000 MG tablet   Oral   Take 1,000 mg by mouth 3 (three) times daily.         Marland Kitchen loratadine-pseudoephedrine (CLARITIN-D 24-HOUR) 10-240 MG per 24 hr tablet   Oral   Take 1 tablet by mouth daily.         Marland Kitchen omeprazole (PRILOSEC) 40 MG capsule   Oral   Take 40 mg by mouth daily.         . promethazine (PHENERGAN) 25 MG tablet   Oral   Take 25 mg by mouth 2 (two) times daily as needed for nausea.         . propranolol (INDERAL) 10 MG tablet   Oral   Take 10 mg by mouth 2 (two) times daily.         . sodium chloride (OCEAN) 0.65 % nasal spray   Nasal   Place 1 spray into the nose 2 (two) times daily as needed for congestion.           BP 102/58  Pulse 72  Temp(Src) 98.3 F (36.8 C) (Oral)  Resp 16  SpO2 97% Physical Exam  Nursing note and vitals reviewed. Constitutional: She is oriented to person, place, and time. She appears well-developed and well-nourished.  HENT:  Head: Normocephalic and atraumatic.  Eyes: EOM are normal. Pupils are equal, round, and reactive to light.  Neck: Normal range of motion. Neck supple.  Cardiovascular: Normal rate, regular rhythm and normal heart sounds.   No murmur heard. Pulmonary/Chest: Effort normal and breath sounds normal. No respiratory distress. She has no wheezes. She has no rales.  Abdominal: Soft. Bowel sounds  are normal. She exhibits no distension. There is no tenderness. There is no rebound and no guarding.  Musculoskeletal: Normal range of motion.  Neurological: She is alert and oriented  to person, place, and time. No cranial nerve deficit.  Skin: Skin is warm and dry.  Psychiatric: She has a normal mood and affect. Her speech is normal.    ED Course  Procedures (including critical care time) Labs Review Labs Reviewed  CBC WITH DIFFERENTIAL  PREGNANCY, URINE  URINALYSIS, ROUTINE W REFLEX MICROSCOPIC   Imaging Review No results found.  MDM  No diagnosis found. Patient with reported pain everywhere. Recently had IVC filter placed. She states she was only given a three-day supply pain medicines, however she does get monthly pain medicines from her primary care Dr. She had 90 hydrocodone filled on September 9. She has had multiple visits to the ER in between these episodes. Blood work will be done to evaluate hemoglobin and Depakote level. Patient will get CT scan to evaluate placement of the IVC filter. Patient was informed she will not be getting more narcotics for home do to her frequent visits to the ER and her recent month-long prescription from her PCP.   Juliet Rude. Rubin Payor, MD 01/09/13 1907

## 2013-01-10 NOTE — ED Provider Notes (Signed)
Medical screening examination/treatment/procedure(s) were conducted as a shared visit with non-physician practitioner(s) and myself.  I personally evaluated the patient during the encounter  Lori Ford R. Laquinta Hazell, MD 01/10/13 1056 

## 2013-02-01 ENCOUNTER — Emergency Department (HOSPITAL_COMMUNITY): Payer: Medicaid Other

## 2013-02-01 ENCOUNTER — Emergency Department (HOSPITAL_COMMUNITY)
Admission: EM | Admit: 2013-02-01 | Discharge: 2013-02-01 | Disposition: A | Discharge status: Home / Self Care | Payer: Medicaid Other | Attending: Emergency Medicine | Admitting: Emergency Medicine

## 2013-02-01 ENCOUNTER — Encounter (HOSPITAL_COMMUNITY): Payer: Self-pay | Admitting: Emergency Medicine

## 2013-02-01 DIAGNOSIS — Z79899 Other long term (current) drug therapy: Secondary | ICD-10-CM | POA: Insufficient documentation

## 2013-02-01 DIAGNOSIS — Z21 Asymptomatic human immunodeficiency virus [HIV] infection status: Secondary | ICD-10-CM | POA: Insufficient documentation

## 2013-02-01 DIAGNOSIS — F17200 Nicotine dependence, unspecified, uncomplicated: Secondary | ICD-10-CM | POA: Insufficient documentation

## 2013-02-01 DIAGNOSIS — Z8541 Personal history of malignant neoplasm of cervix uteri: Secondary | ICD-10-CM | POA: Insufficient documentation

## 2013-02-01 DIAGNOSIS — Z86718 Personal history of other venous thrombosis and embolism: Secondary | ICD-10-CM | POA: Insufficient documentation

## 2013-02-01 DIAGNOSIS — IMO0002 Reserved for concepts with insufficient information to code with codable children: Secondary | ICD-10-CM | POA: Insufficient documentation

## 2013-02-01 DIAGNOSIS — Z91040 Latex allergy status: Secondary | ICD-10-CM | POA: Insufficient documentation

## 2013-02-01 DIAGNOSIS — R569 Unspecified convulsions: Secondary | ICD-10-CM

## 2013-02-01 DIAGNOSIS — G40909 Epilepsy, unspecified, not intractable, without status epilepticus: Secondary | ICD-10-CM | POA: Insufficient documentation

## 2013-02-01 DIAGNOSIS — J45909 Unspecified asthma, uncomplicated: Secondary | ICD-10-CM | POA: Insufficient documentation

## 2013-02-01 DIAGNOSIS — I10 Essential (primary) hypertension: Secondary | ICD-10-CM | POA: Insufficient documentation

## 2013-02-01 DIAGNOSIS — K219 Gastro-esophageal reflux disease without esophagitis: Secondary | ICD-10-CM | POA: Insufficient documentation

## 2013-02-01 LAB — GLUCOSE, CAPILLARY: Glucose-Capillary: 67 mg/dL — ABNORMAL LOW (ref 70–99)

## 2013-02-01 LAB — CBC WITH DIFFERENTIAL/PLATELET
Eosinophils Absolute: 0 10*3/uL (ref 0.0–0.7)
Eosinophils Relative: 1 % (ref 0–5)
Hemoglobin: 13.2 g/dL (ref 12.0–15.0)
Lymphs Abs: 2 10*3/uL (ref 0.7–4.0)
MCH: 31.4 pg (ref 26.0–34.0)
MCV: 92.6 fL (ref 78.0–100.0)
Monocytes Relative: 9 % (ref 3–12)
Neutro Abs: 2.8 10*3/uL (ref 1.7–7.7)
Neutrophils Relative %: 52 % (ref 43–77)
Platelets: 179 10*3/uL (ref 150–400)
RBC: 4.21 MIL/uL (ref 3.87–5.11)
WBC: 5.4 10*3/uL (ref 4.0–10.5)

## 2013-02-01 LAB — BASIC METABOLIC PANEL
Calcium: 9.3 mg/dL (ref 8.4–10.5)
GFR calc Af Amer: >90 mL/min (ref >90)
GFR calc non Af Amer: 83 mL/min — ABNORMAL LOW (ref >90)
Glucose, Bld: 76 mg/dL (ref 70–99)
Potassium: 4.2 mEq/L (ref 3.5–5.1)
Sodium: 136 mEq/L (ref 135–145)

## 2013-02-01 LAB — VALPROIC ACID LEVEL: Valproic Acid Lvl: 27.5 ug/mL — ABNORMAL LOW (ref 50.0–100.0)

## 2013-02-01 MED ORDER — CYCLOBENZAPRINE HCL 10 MG PO TABS: 10.0000 mg | ORAL_TABLET | Freq: Two times a day (BID) | ORAL | Status: DC | PRN

## 2013-02-01 MED ORDER — ONDANSETRON HCL 4 MG/2ML IJ SOLN
4.0000 mg | Freq: Once | INTRAMUSCULAR | Status: AC
  Administered 2013-02-01: 4 mg via INTRAVENOUS
  Filled 2013-02-01: qty 2

## 2013-02-01 MED ORDER — DIVALPROEX SODIUM 250 MG PO DR TAB
500.0000 mg | DELAYED_RELEASE_TABLET | Freq: Once | ORAL | Status: AC
  Administered 2013-02-01: 500 mg via ORAL
  Filled 2013-02-01: qty 2

## 2013-02-01 MED ORDER — MORPHINE SULFATE 4 MG/ML IJ SOLN
6.0000 mg | Freq: Once | INTRAMUSCULAR | Status: AC
  Administered 2013-02-01: 6 mg via INTRAVENOUS
  Filled 2013-02-01: qty 2

## 2013-02-01 NOTE — ED Provider Notes (Signed)
CSN: [id number]     Arrival date & time 02/01/13  1445 History   First MD Initiated Contact with Patient 02/01/13 1456     Chief Complaint  Patient presents with  . Seizures  . Fall   (Consider location/radiation/quality/duration/timing/severity/associated sxs/prior Treatment) Patient is a 37 y.o. female presenting with seizures. The history is provided by the patient and the EMS personnel.  Seizures Seizure activity on arrival: no   Seizure type:  Grand mal Initial focality:  None Episode characteristics: generalized shaking and unresponsiveness   Postictal symptoms: confusion and memory loss   Return to baseline: yes   Severity:  Severe Timing:  Clustered Number of seizures this episode:  3 Progression:  Resolved Context: medical non-compliance and stress   Recent head injury:  No recent head injuries PTA treatment:  None History of seizures: yes     Past Medical History  Diagnosis Date  . 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 Seizures     due to head trauma as adult  . Cervical cancer     2013, untreated  . Asthma     as child  . Meningioma   . Gallstones     s/p cholecystectomy  . HTN (hypertension)   . GERD (gastroesophageal reflux disease)   . Allergic rhinitis   . Cigarette nicotine dependence with withdrawal   . DVT (deep venous thrombosis)   . HIV (human immunodeficiency virus infection)    Past Surgical History  Procedure Laterality Date  . Brain surgery      2013 to remove a meningioma  . Cholecystectomy     Family History  Problem Relation Age of Onset  . Other Mother     varicose vein  . Asthma Mother   . High blood pressure Mother   . Deep vein thrombosis Neg Hx   . Pulmonary embolism Neg Hx   . Cancer    . Diabetes    . High blood pressure    . Asthma    . Thyroid disease    . Cancer Father    History  Substance Use Topics  . Smoking status: Current Every Day Smoker -- 0.25 packs/day for 16 years    Types: Cigarettes  .  Smokeless tobacco: Never Used  . Alcohol Use: No     Comment: Smokes 3 joints a day   OB History   Grav Para Term Preterm Abortions TAB SAB Ect Mult Living                 Review of Systems  Respiratory: Negative for chest tightness and shortness of breath.   Neurological: Positive for seizures. Negative for dizziness, syncope, weakness, light-headedness and numbness.  All other systems reviewed and are negative.    Allergies  Ibuprofen; Clindamycin/lincomycin; Coconut flavor; Latex; Other; and Aspirin  Home Medications   Current Outpatient Rx  Name  Route  Sig  Dispense  Refill  . albuterol (PROVENTIL HFA;VENTOLIN HFA) 108 (90 BASE) MCG/ACT inhaler   Inhalation   Inhale 2 puffs into the lungs every 4 (four) hours as needed for wheezing or shortness of breath.          . beclomethasone (QVAR) 40 MCG/ACT inhaler   Inhalation   Inhale 2 puffs into the lungs 2 (two) times daily.          . benzocaine (HURRICAINE) 20 % oral spray   Topical   Apply 1 application topically 3 (three) times daily as  needed for pain.         . diazepam (VALIUM) 10 MG tablet   Oral   Take 10 mg by mouth every 8 (eight) hours as needed for anxiety.          . divalproex (DEPAKOTE) 500 MG DR tablet   Oral   Take 1 tablet (500 mg total) by mouth 3 (three) times daily.   90 tablet   5   . fexofenadine-pseudoephedrine (ALLEGRA-D 24) 180-240 MG per 24 hr tablet   Oral   Take 1 tablet by mouth daily.         . fluticasone (FLONASE) 50 MCG/ACT nasal spray   Nasal   Place 1 spray into the nose 2 (two) times daily.         . folic acid (FOLVITE) 1 MG tablet   Oral   Take 1 mg by mouth daily.         Marland Kitchen gabapentin (NEURONTIN) 400 MG capsule   Oral   Take 2 capsules (800 mg total) by mouth 2 (two) times daily.   120 capsule   6   . hydrochlorothiazide (HYDRODIURIL) 25 MG tablet   Oral   Take 25 mg by mouth daily.         Marland Kitchen HYDROcodone-acetaminophen (NORCO) 10-325 MG per  tablet   Oral   Take 2 tablets by mouth every 8 (eight) hours as needed for pain.         Marland Kitchen levETIRAcetam (KEPPRA) 1000 MG tablet   Oral   Take 1,000 mg by mouth 3 (three) times daily.         Marland Kitchen loratadine (CLARITIN) 10 MG tablet   Oral   Take 10 mg by mouth daily.         . mirtazapine (REMERON) 15 MG tablet   Oral   Take 15 mg by mouth at bedtime.         Marland Kitchen omeprazole (PRILOSEC) 20 MG capsule   Oral   Take 20 mg by mouth 2 (two) times daily.         Marland Kitchen PARoxetine (PAXIL) 20 MG tablet   Oral   Take 20 mg by mouth at bedtime.         . propranolol (INDERAL) 10 MG tablet   Oral   Take 10 mg by mouth 2 (two) times daily.         . sodium chloride (OCEAN) 0.65 % nasal spray   Nasal   Place 1 spray into the nose 2 (two) times daily as needed for congestion.          . triamcinolone ointment (KENALOG) 0.5 %   Topical   Apply 1 application topically 2 (two) times daily.         . cyclobenzaprine (FLEXERIL) 10 MG tablet   Oral   Take 1 tablet (10 mg total) by mouth 2 (two) times daily as needed for muscle spasms.   2 tablet   0   . enoxaparin (LOVENOX) 100 MG/ML injection   Subcutaneous   Inject 95 mg into the skin every 12 (twelve) hours.         . promethazine (PHENERGAN) 25 MG tablet   Oral   Take 25 mg by mouth 2 (two) times daily as needed for nausea.          BP 104/68  Pulse 60  Temp(Src) 98.8 F (37.1 C) (Oral)  Resp 16  SpO2 100%  LMP 02/01/2013 Physical Exam  Nursing note and vitals reviewed. Constitutional: She is oriented to person, place, and time. She appears well-developed and well-nourished. No distress. Cervical collar and backboard in place.  HENT:  Head: Normocephalic and atraumatic.  Mouth/Throat: Oropharynx is clear and moist. No oropharyngeal exudate.  Eyes: Conjunctivae and EOM are normal. Pupils are equal, round, and reactive to light.  Neck: Normal range of motion. Neck supple.  Cardiovascular: Normal rate,  regular rhythm and normal heart sounds.  Exam reveals no gallop and no friction rub.   No murmur heard. Pulmonary/Chest: Effort normal and breath sounds normal. No respiratory distress. She has no wheezes. She has no rales. She exhibits no tenderness.  Abdominal: Soft. She exhibits no distension. There is no tenderness.  obese  Musculoskeletal: Normal range of motion. She exhibits no edema and no tenderness.  Lymphadenopathy:    She has no cervical adenopathy.  Neurological: She is alert and oriented to person, place, and time. No cranial nerve deficit or sensory deficit. Coordination normal. GCS eye subscore is 4. GCS verbal subscore is 5. GCS motor subscore is 6.  Reflex Scores:      Patellar reflexes are 2+ on the right side and 2+ on the left side.      Achilles reflexes are 2+ on the right side and 2+ on the left side. Skin: Skin is warm and dry. No rash noted. She is not diaphoretic.  Psychiatric: She has a normal mood and affect. Her behavior is normal. Judgment and thought content normal.  tearful    ED Course  Procedures (including critical care time) Labs Review Labs Reviewed  BASIC METABOLIC PANEL - Abnormal; Notable for the following:    GFR calc non Af Amer 83 (*)    All other components within normal limits  VALPROIC ACID LEVEL - Abnormal; Notable for the following:    Valproic Acid Lvl 27.5 (*)    All other components within normal limits  GLUCOSE, CAPILLARY - Abnormal; Notable for the following:    Glucose-Capillary 67 (*)    All other components within normal limits  CBC WITH DIFFERENTIAL   Imaging Review Dg Chest 1 View  02/01/2013   CLINICAL DATA:  Seizures  EXAM: CHEST - 1 VIEW  COMPARISON:  09/24/2012  FINDINGS: The lungs are clear without focal infiltrate, edema, pneumothorax or pleural effusion. The cardiopericardial silhouette is within normal limits for size. Imaged bony structures of the thorax are intact.  IMPRESSION: No active disease.   Electronically  Signed   By: Kennith Center M.D.   On: 02/01/2013 16:51   Dg Thoracic Spine 2 View  02/01/2013   CLINICAL DATA:  Seizures. Left chest pain and shortness of Breath.  EXAM: THORACIC SPINE - 2 VIEW  COMPARISON:  10/06/2012.  FINDINGS: There is no evidence of thoracic spine fracture. Alignment is normal. No other significant bone abnormalities are identified.  IMPRESSION: Negative.   Electronically Signed   By: Herbie Baltimore M.D.   On: 02/01/2013 16:43   Ct Head Wo Contrast  02/01/2013   CLINICAL DATA:  Seizure. Prior craniotomy for benign tumor (Meningioma). Cervical cancer. HIV infection. Hypertension.  EXAM: CT HEAD AND CERVICAL SPINE WITHOUT CONTRAST  TECHNIQUE: Contiguous axial images were obtained from the base of the skull through the vertex without intravenous contrast. CT cervical spine with sagittal and coronal reconstructed images performed.  COMPARISON:  09/28/2012 CT of the head and cervical spine. 10/07/2014 chest CT.  FINDINGS: Head CT:  Prior left temporal craniotomy with postsurgical changes noted.  No skull fracture or intracranial hemorrhage.  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  No intracranial mass lesion noted on this unenhanced exam MR imaging would prove more sensitive for detection of intracranial mass/ seizure focus if clinically desired.  Cervical spine CT:  Artifact C6 through T1 caused by patient's habitus and possible motion. Taking this limitation into account, no cervical spine fracture is noted. No abnormal prevertebral soft tissue swelling. Cervical spondylotic changes most notable C5-6 through the C7-T1.  If there were high clinical suspicion of cervical spine injury  8 x 6 x 5 mm nodule right lung apex may have changed minimally. Short term followup CT scan in chest in 3 months recommended for further delineation.  Scattered increased number of normal to slightly prominent size lymph nodes throughout the neck bilaterally. Several of these lymph nodes have  fatty centers (suggesting these are benign) and were noted previously. Etiology/significance indeterminate in this patient who is HIV positive. Symmetric prominence of the lymphoid tissue of Waldeyer's Ring without definitive primary worrisome neck mass identified.  IMPRESSION: Head CT:  No acute intracranial abnormality noted. Please see above.  Cervical spine CT:  Artifact C6 through T1 caused by patient's habitus and possible motion. Taking this limitation into account, no cervical spine fracture is noted. Please see above.  8 x 6 x 5 mm nodule right lung apex may have changed minimally. Short term followup CT scan in chest in 3 months recommended for further delineation.  Scattered increased number of normal to slightly prominent size lymph nodes throughout the neck bilaterally. Several of these lymph nodes have fatty centers (suggesting these are benign) and were noted previously. Symmetric prominence of the lymphoid tissue of Waldeyer's Ring without definitive primary worrisome neck mass identified. Etiology/significance indeterminate in this patient who is HIV positive.   Electronically Signed   By: Bridgett Larsson M.D.   On: 02/01/2013 16:10   Ct Cervical Spine Wo Contrast  02/01/2013   CLINICAL DATA:  Seizure. Prior craniotomy for benign tumor (Meningioma). Cervical cancer. HIV infection. Hypertension.  EXAM: CT HEAD AND CERVICAL SPINE WITHOUT CONTRAST  TECHNIQUE: Contiguous axial images were obtained from the base of the skull through the vertex without intravenous contrast. CT cervical spine with sagittal and coronal reconstructed images performed.  COMPARISON:  09/28/2012 CT of the head and cervical spine. 10/07/2014 chest CT.  FINDINGS: Head CT:  Prior left temporal craniotomy with postsurgical changes noted.  No skull fracture or intracranial hemorrhage.  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  No intracranial mass lesion noted on this unenhanced exam MR imaging would prove more  sensitive for detection of intracranial mass/ seizure focus if clinically desired.  Cervical spine CT:  Artifact C6 through T1 caused by patient's habitus and possible motion. Taking this limitation into account, no cervical spine fracture is noted. No abnormal prevertebral soft tissue swelling. Cervical spondylotic changes most notable C5-6 through the C7-T1.  If there were high clinical suspicion of cervical spine injury  8 x 6 x 5 mm nodule right lung apex may have changed minimally. Short term followup CT scan in chest in 3 months recommended for further delineation.  Scattered increased number of normal to slightly prominent size lymph nodes throughout the neck bilaterally. Several of these lymph nodes have fatty centers (suggesting these are benign) and were noted previously. Etiology/significance indeterminate in this patient who is HIV positive. Symmetric prominence of the lymphoid tissue of Waldeyer's Ring without definitive primary worrisome neck mass identified.  IMPRESSION: Head CT:  No acute intracranial abnormality noted. Please see above.  Cervical spine CT:  Artifact C6 through T1 caused by patient's habitus and possible motion. Taking this limitation into account, no cervical spine fracture is noted. Please see above.  8 x 6 x 5 mm nodule right lung apex may have changed minimally. Short term followup CT scan in chest in 3 months recommended for further delineation.  Scattered increased number of normal to slightly prominent size lymph nodes throughout the neck bilaterally. Several of these lymph nodes have fatty centers (suggesting these are benign) and were noted previously. Symmetric prominence of the lymphoid tissue of Waldeyer's Ring without definitive primary worrisome neck mass identified. Etiology/significance indeterminate in this patient who is HIV positive.   Electronically Signed   By: Bridgett Larsson M.D.   On: 02/01/2013 16:10    EKG Interpretation   None      Date: 02/01/2013   Rate: 69  Rhythm: normal sinus rhythm  QRS Axis: normal  Intervals: normal  ST/T Wave abnormalities: normal  Conduction Disutrbances:none  Narrative Interpretation:   Old EKG Reviewed: unchanged    MDM   1. Seizure     38 year old female with a history of grand mal seizure disorder on Depakote, Neurontin, Keppra presents with a grand mal seizure. Patient is an argument with her husband over the phone at [employer] when she last remembers prior to her seizure. She had a witnessed fall landing backwards on her head and having grand mal seizure episode x3. Postictal on EMS arrival and return to baseline prior to intubation the emergency department. Complaining of pain all over, but notably tender in her thoracic and cervical spine. Tearful and exam. No other obvious source of trauma. No scalp hematomas. Nonfocal neurologic exam. Patient did not take her morning medications for seizures you to not having eaten yet, however she states compliance otherwise. States that she does have seizures almost weekly. Muscle neurology last week and is scheduled to followup with them again next week for a EEG.  Will evaluate for traumatic injury as well as other source for recurrent seizures. Electrolytes, anemia, urinalysis, Depakote level. Treating pain.  Depakote level below normal. There is given in the emergency department. Remainder of laboratory workup unremarkable. Traumatic workup with CT, plain films negative for genetic injury. On reexamination, patient remains nonfocal neurologically. Remains at baseline from a seizure standpoint. Patient was ambulated and did so without difficulty. Based on these findings as well as stressful situation and low Depakote level, will discharge home with neurology followup for known seizure disorder and further management. Remained seizure free in the emergency department.  This patient was discussed with my attending, Dr. Oletta Lamas.  Dorna Leitz, MD 02/02/13 [id number]

## 2013-02-01 NOTE — ED Notes (Signed)
Pt removed from the LSB by resident.

## 2013-02-01 NOTE — ED Notes (Signed)
Pt returned from x-ray, pt c/o pain all, pain  meds given.  Pt remains alert and oriented x's 3, seizure pads on bed.  No seizure activity noted at present.  Labs being drawn.

## 2013-02-01 NOTE — ED Notes (Signed)
Pt to department via EMS- pt had 3 witnessed seizures at [employer]. Pt was unresponsive on EMS arrival but now A&O on arrival with hx of seizures. Witnesses report that she hit her head on the ground. Pt in c-collar and on LSB. No obvious injury noted. CBG-82 Bp- 121/76 Hr-69 RR-16 O2-99 20g right hand.

## 2013-02-01 NOTE — ED Notes (Signed)
Pt ambulated to bathroom without any asst. Without any problems.

## 2013-02-04 NOTE — ED Provider Notes (Signed)
I saw and evaluated the patient, reviewed the resident's note and I agree with the findings and plan.  EKG Interpretation     Ventricular Rate:  69 PR Interval:  183 QRS Duration: 73 QT Interval:  398 QTC Calculation: 427 R Axis:   75 Text Interpretation:  Sinus rhythm Normal ECG No significant change since last tracing           Pt with h/o chronic seizures, consideration of pseudoseizures per Dr. Anne Hahn' notes from [facility] a couple of months prior.  Plan was to consider video EEG if EEG was non diagnostic.  Pt had multiple seizures and fall today in context of stressful phone call with sig other.  Valproate level mildly low.  Pt is on 2 other anti-epileptics.  No further seizures in the ED with prolonged observation.  Trauma work up is negative.  Pain appropriately treated in the ED.  Pt encoruaged to take OTC meds at home, remain compliant with anti-epileptics at home, and follow up closely with Dr. Anne Hahn as outpt.   Gavin Pound. Andres Vest, MD 02/04/13 1008

## 2013-03-08 ENCOUNTER — Other Ambulatory Visit: Payer: Medicaid Other | Admitting: Radiology

## 2013-03-08 ENCOUNTER — Telehealth: Payer: Self-pay | Admitting: *Deleted

## 2013-03-08 NOTE — Telephone Encounter (Signed)
Called patient no answer. Left a message that her revisit was canceled until she has her EEG done. Patient is to call the office to schedule.

## 2013-03-09 ENCOUNTER — Ambulatory Visit: Payer: Medicaid Other | Admitting: Nurse Practitioner

## 2013-03-16 ENCOUNTER — Emergency Department (HOSPITAL_COMMUNITY)
Admission: EM | Admit: 2013-03-16 | Discharge: 2013-03-16 | Disposition: A | Discharge status: Home / Self Care | Payer: Medicaid Other | Attending: Emergency Medicine | Admitting: Emergency Medicine

## 2013-03-16 ENCOUNTER — Emergency Department (HOSPITAL_COMMUNITY): Payer: Medicaid Other

## 2013-03-16 ENCOUNTER — Encounter (HOSPITAL_COMMUNITY): Payer: Self-pay | Admitting: Emergency Medicine

## 2013-03-16 DIAGNOSIS — Z91040 Latex allergy status: Secondary | ICD-10-CM | POA: Insufficient documentation

## 2013-03-16 DIAGNOSIS — Z8719 Personal history of other diseases of the digestive system: Secondary | ICD-10-CM | POA: Insufficient documentation

## 2013-03-16 DIAGNOSIS — M545 Low back pain, unspecified: Secondary | ICD-10-CM | POA: Insufficient documentation

## 2013-03-16 DIAGNOSIS — Z86011 Personal history of benign neoplasm of the brain: Secondary | ICD-10-CM | POA: Insufficient documentation

## 2013-03-16 DIAGNOSIS — J45909 Unspecified asthma, uncomplicated: Secondary | ICD-10-CM | POA: Insufficient documentation

## 2013-03-16 DIAGNOSIS — Z86718 Personal history of other venous thrombosis and embolism: Secondary | ICD-10-CM | POA: Insufficient documentation

## 2013-03-16 DIAGNOSIS — Z8541 Personal history of malignant neoplasm of cervix uteri: Secondary | ICD-10-CM | POA: Insufficient documentation

## 2013-03-16 DIAGNOSIS — I10 Essential (primary) hypertension: Secondary | ICD-10-CM | POA: Insufficient documentation

## 2013-03-16 DIAGNOSIS — Z79899 Other long term (current) drug therapy: Secondary | ICD-10-CM | POA: Insufficient documentation

## 2013-03-16 DIAGNOSIS — R1031 Right lower quadrant pain: Secondary | ICD-10-CM | POA: Insufficient documentation

## 2013-03-16 DIAGNOSIS — Z21 Asymptomatic human immunodeficiency virus [HIV] infection status: Secondary | ICD-10-CM | POA: Insufficient documentation

## 2013-03-16 DIAGNOSIS — Z3202 Encounter for pregnancy test, result negative: Secondary | ICD-10-CM | POA: Insufficient documentation

## 2013-03-16 DIAGNOSIS — G40909 Epilepsy, unspecified, not intractable, without status epilepticus: Secondary | ICD-10-CM | POA: Insufficient documentation

## 2013-03-16 DIAGNOSIS — R1011 Right upper quadrant pain: Secondary | ICD-10-CM | POA: Insufficient documentation

## 2013-03-16 DIAGNOSIS — K219 Gastro-esophageal reflux disease without esophagitis: Secondary | ICD-10-CM | POA: Insufficient documentation

## 2013-03-16 DIAGNOSIS — F17200 Nicotine dependence, unspecified, uncomplicated: Secondary | ICD-10-CM | POA: Insufficient documentation

## 2013-03-16 DIAGNOSIS — M549 Dorsalgia, unspecified: Secondary | ICD-10-CM

## 2013-03-16 LAB — BASIC METABOLIC PANEL
CO2: 28 mEq/L (ref 19–32)
Calcium: 8.8 mg/dL (ref 8.4–10.5)
Chloride: 99 mEq/L (ref 96–112)
GFR calc Af Amer: >90 mL/min (ref >90)
Glucose, Bld: 91 mg/dL (ref 70–99)
Potassium: 4 mEq/L (ref 3.5–5.1)

## 2013-03-16 LAB — CBC WITH DIFFERENTIAL/PLATELET
Basophils Absolute: 0 10*3/uL (ref 0.0–0.1)
Basophils Relative: 1 % (ref 0–1)
HCT: 38 % (ref 36.0–46.0)
Hemoglobin: 13 g/dL (ref 12.0–15.0)
Lymphocytes Relative: 40 % (ref 12–46)
MCHC: 34.2 g/dL (ref 30.0–36.0)
Monocytes Relative: 10 % (ref 3–12)
Neutro Abs: 3.8 10*3/uL (ref 1.7–7.7)
Neutrophils Relative %: 49 % (ref 43–77)
RBC: 3.99 MIL/uL (ref 3.87–5.11)
RDW: 13.5 % (ref 11.5–15.5)
WBC: 7.7 10*3/uL (ref 4.0–10.5)

## 2013-03-16 LAB — URINALYSIS, ROUTINE W REFLEX MICROSCOPIC
Glucose, UA: NEGATIVE mg/dL
Leukocytes, UA: NEGATIVE
Nitrite: NEGATIVE
Specific Gravity, Urine: 1.028 (ref 1.005–1.030)
pH: 6.5 (ref 5.0–8.0)

## 2013-03-16 LAB — PREGNANCY, URINE: Preg Test, Ur: NEGATIVE

## 2013-03-16 MED ORDER — MORPHINE SULFATE 4 MG/ML IJ SOLN
4.0000 mg | Freq: Once | INTRAMUSCULAR | Status: AC
  Administered 2013-03-16: 4 mg via INTRAVENOUS
  Filled 2013-03-16: qty 1

## 2013-03-16 MED ORDER — IOHEXOL 300 MG/ML  SOLN
100.0000 mL | Freq: Once | INTRAMUSCULAR | Status: AC | PRN
  Administered 2013-03-16: 100 mL via INTRAVENOUS

## 2013-03-16 MED ORDER — OXYCODONE-ACETAMINOPHEN 5-325 MG PO TABS
1.0000 | ORAL_TABLET | Freq: Once | ORAL | Status: AC
  Administered 2013-03-16: 1 via ORAL
  Filled 2013-03-16: qty 1

## 2013-03-16 MED ORDER — OXYCODONE-ACETAMINOPHEN 5-325 MG PO TABS: 1.0000 | ORAL_TABLET | ORAL | Status: DC | PRN

## 2013-03-16 MED ORDER — IOHEXOL 300 MG/ML  SOLN
25.0000 mL | INTRAMUSCULAR | Status: DC | PRN
  Administered 2013-03-16: 25 mL via ORAL

## 2013-03-16 NOTE — ED Provider Notes (Signed)
CSN: [id number]     Arrival date & time 03/16/13  4098 History   First MD Initiated Contact with Patient 03/16/13 [phone]     Chief Complaint  Patient presents with  . Groin pain    (Consider location/radiation/quality/duration/timing/severity/associated sxs/prior Treatment) Patient is a 37 y.o. female presenting with abdominal pain. The history is provided by the patient. No language interpreter was used.  Abdominal Pain Associated symptoms: no chest pain, no chills, no fever, no nausea and no shortness of breath   Associated symptoms comment:  Right sided abdominal pain for the past 4 days. The pain also affects her low back, hip and upper right leg. She had an IVC filter placed 2 months ago via the right groin and has had an uncomplicated post surgical course. She became concerned with right sided abdominal pain possibly being associated with her filter function. No fever. She has had no vomiting. No urinary symptoms.    Past Medical History  Diagnosis Date  . 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 Seizures     due to head trauma as adult  . Cervical cancer     2013, untreated  . Asthma     as child  . Meningioma   . Gallstones     s/p cholecystectomy  . HTN (hypertension)   . GERD (gastroesophageal reflux disease)   . Allergic rhinitis   . Cigarette nicotine dependence with withdrawal   . DVT (deep venous thrombosis)   . HIV (human immunodeficiency virus infection)    Past Surgical History  Procedure Laterality Date  . Brain surgery      2013 to remove a meningioma  . Cholecystectomy     Family History  Problem Relation Age of Onset  . Other Mother     varicose vein  . Asthma Mother   . High blood pressure Mother   . Deep vein thrombosis Neg Hx   . Pulmonary embolism Neg Hx   . Cancer    . Diabetes    . High blood pressure    . Asthma    . Thyroid disease    . Cancer Father    History  Substance Use Topics  . Smoking status: Current Every Day Smoker --  0.25 packs/day for 16 years    Types: Cigarettes  . Smokeless tobacco: Never Used  . Alcohol Use: No     Comment: Smokes 3 joints a day   OB History   Grav Para Term Preterm Abortions TAB SAB Ect Mult Living                 Review of Systems  Constitutional: Negative for fever and chills.  HENT: Negative.   Respiratory: Negative.  Negative for shortness of breath.   Cardiovascular: Negative.  Negative for chest pain.  Gastrointestinal: Positive for abdominal pain. Negative for nausea.  Musculoskeletal: Positive for back pain.  Skin: Negative.   Neurological: Negative.     Allergies  Ibuprofen; Clindamycin/lincomycin; Coconut flavor; Latex; Other; and Aspirin  Home Medications   Current Outpatient Rx  Name  Route  Sig  Dispense  Refill  . acetaminophen (TYLENOL) 500 MG tablet   Oral   Take 1,000 mg by mouth every 6 (six) hours as needed for moderate pain or headache.         . albuterol (PROVENTIL HFA;VENTOLIN HFA) 108 (90 BASE) MCG/ACT inhaler   Inhalation   Inhale 2 puffs into the lungs every 4 (four) hours  as needed for wheezing or shortness of breath.          . beclomethasone (QVAR) 40 MCG/ACT inhaler   Inhalation   Inhale 2 puffs into the lungs 2 (two) times daily.          . benzocaine (HURRICAINE) 20 % oral spray   Topical   Apply 1 application topically 3 (three) times daily as needed for pain.         . cyclobenzaprine (FLEXERIL) 10 MG tablet   Oral   Take 1 tablet (10 mg total) by mouth 2 (two) times daily as needed for muscle spasms.   2 tablet   0   . diazepam (VALIUM) 10 MG tablet   Oral   Take 10 mg by mouth every 8 (eight) hours as needed for anxiety.          . divalproex (DEPAKOTE) 500 MG DR tablet   Oral   Take 1 tablet (500 mg total) by mouth 3 (three) times daily.   90 tablet   5   . fexofenadine-pseudoephedrine (ALLEGRA-D 24) 180-240 MG per 24 hr tablet   Oral   Take 1 tablet by mouth daily.         . fluticasone  (FLONASE) 50 MCG/ACT nasal spray   Nasal   Place 1 spray into the nose 2 (two) times daily.         . folic acid (FOLVITE) 1 MG tablet   Oral   Take 1 mg by mouth daily.         Marland Kitchen gabapentin (NEURONTIN) 400 MG capsule   Oral   Take 2 capsules (800 mg total) by mouth 2 (two) times daily.   120 capsule   6   . hydrochlorothiazide (HYDRODIURIL) 25 MG tablet   Oral   Take 25 mg by mouth daily.         Marland Kitchen HYDROcodone-acetaminophen (NORCO) 10-325 MG per tablet   Oral   Take 2 tablets by mouth every 8 (eight) hours as needed for pain.         Marland Kitchen levETIRAcetam (KEPPRA) 1000 MG tablet   Oral   Take 1,000 mg by mouth 3 (three) times daily.         Marland Kitchen loratadine (CLARITIN) 10 MG tablet   Oral   Take 10 mg by mouth daily.         . mirtazapine (REMERON) 15 MG tablet   Oral   Take 15 mg by mouth at bedtime.         Marland Kitchen omeprazole (PRILOSEC) 20 MG capsule   Oral   Take 20 mg by mouth 2 (two) times daily.         Marland Kitchen PARoxetine (PAXIL) 20 MG tablet   Oral   Take 20 mg by mouth daily.          . promethazine (PHENERGAN) 25 MG tablet   Oral   Take 25 mg by mouth 2 (two) times daily as needed for nausea.         . propranolol (INDERAL) 10 MG tablet   Oral   Take 10 mg by mouth 2 (two) times daily.         . sodium chloride (OCEAN) 0.65 % nasal spray   Nasal   Place 1 spray into the nose 2 (two) times daily as needed for congestion.          . triamcinolone ointment (KENALOG) 0.5 %   Topical   Apply 1  application topically 2 (two) times daily.          BP 143/55  Pulse 107  Temp(Src) 98 F (36.7 C) (Oral)  Resp 20  Wt 235 lb (106.595 kg)  SpO2 100% Physical Exam  Constitutional: She is oriented to person, place, and time. She appears well-developed and well-nourished.  HENT:  Head: Normocephalic.  Neck: Normal range of motion. Neck supple.  Cardiovascular: Normal rate and regular rhythm.   Pulmonary/Chest: Effort normal and breath sounds normal.  She has no wheezes.  Abdominal: Soft. Bowel sounds are normal. There is tenderness. There is no rebound and no guarding.  RLQ and RUQ tenderness. Soft abdomen. No palpable mass.   Musculoskeletal: Normal range of motion. She exhibits no edema.  Right lower back and right hip tenderness to palpation. No swelling or discoloration.   Neurological: She is alert and oriented to person, place, and time.  Skin: Skin is warm and dry. No rash noted.  Psychiatric: She has a normal mood and affect.    ED Course  Procedures (including critical care time) Labs Review Labs Reviewed  CBC WITH DIFFERENTIAL  BASIC METABOLIC PANEL  URINALYSIS, ROUTINE W REFLEX MICROSCOPIC   Results for orders placed during the hospital encounter of 03/16/13  CBC WITH DIFFERENTIAL      Result Value Range   WBC 7.7  4.0 - 10.5 K/uL   RBC 3.99  3.87 - 5.11 MIL/uL   Hemoglobin 13.0  12.0 - 15.0 g/dL   HCT 81.1  91.4 - 78.2 %   MCV 95.2  78.0 - 100.0 fL   MCH 32.6  26.0 - 34.0 pg   MCHC 34.2  30.0 - 36.0 g/dL   RDW 95.6  21.3 - 08.6 %   Platelets 190  150 - 400 K/uL   Neutrophils Relative % 49  43 - 77 %   Neutro Abs 3.8  1.7 - 7.7 K/uL   Lymphocytes Relative 40  12 - 46 %   Lymphs Abs 3.0  0.7 - 4.0 K/uL   Monocytes Relative 10  3 - 12 %   Monocytes Absolute 0.8  0.1 - 1.0 K/uL   Eosinophils Relative 1  0 - 5 %   Eosinophils Absolute 0.1  0.0 - 0.7 K/uL   Basophils Relative 1  0 - 1 %   Basophils Absolute 0.0  0.0 - 0.1 K/uL  BASIC METABOLIC PANEL      Result Value Range   Sodium 136  135 - 145 mEq/L   Potassium 4.0  3.5 - 5.1 mEq/L   Chloride 99  96 - 112 mEq/L   CO2 28  19 - 32 mEq/L   Glucose, Bld 91  70 - 99 mg/dL   BUN 12  6 - 23 mg/dL   Creatinine, Ser 5.78  0.50 - 1.10 mg/dL   Calcium 8.8  8.4 - 46.9 mg/dL   GFR calc non Af Amer >90  >90 mL/min   GFR calc Af Amer >90  >90 mL/min  URINALYSIS, ROUTINE W REFLEX MICROSCOPIC      Result Value Range   Color, Urine YELLOW  YELLOW   APPearance CLEAR   CLEAR   Specific Gravity, Urine 1.028  1.005 - 1.030   pH 6.5  5.0 - 8.0   Glucose, UA NEGATIVE  NEGATIVE mg/dL   Hgb urine dipstick NEGATIVE  NEGATIVE   Bilirubin Urine NEGATIVE  NEGATIVE   Ketones, ur NEGATIVE  NEGATIVE mg/dL   Protein, ur NEGATIVE  NEGATIVE  mg/dL   Urobilinogen, UA 0.2  0.0 - 1.0 mg/dL   Nitrite NEGATIVE  NEGATIVE   Leukocytes, UA NEGATIVE  NEGATIVE  PREGNANCY, URINE      Result Value Range   Preg Test, Ur NEGATIVE  NEGATIVE   Ct Abdomen Pelvis W Contrast  03/16/2013   CLINICAL DATA:  Abdominal pain, lower back pain  EXAM: CT ABDOMEN AND PELVIS WITH CONTRAST  TECHNIQUE: Multidetector CT imaging of the abdomen and pelvis was performed using the standard protocol following bolus administration of intravenous contrast.  CONTRAST:  OMNIPAQUE IOHEXOL 300 MG/ML  SOLN  COMPARISON:  01/09/2013.  FINDINGS: There is bibasilar atelectasis.  The liver demonstrates no focal abnormality. There is no intrahepatic or extrahepatic biliary ductal dilatation. The gallbladder is surgically absent. The spleen demonstrates no focal abnormality. The kidneys, adrenal glands and pancreas are normal. The bladder is unremarkable.  The stomach, duodenum, small intestine, and large intestine demonstrate no contrast extravasation or dilatation. There is a normal caliber appendix in the right lower quadrant without periappendiceal inflammatory changes. There is no pneumoperitoneum, pneumatosis, or portal venous gas. There is no abdominal or pelvic free fluid. There is no lymphadenopathy.  The abdominal aorta is normal in caliber with atherosclerosis. There is an IVC filter present with the tip just below the right renal vein. There is a prong of the IVC which extends beyond the IVC loop min along the left anterior aspect. There is a prong of the IVC filter which extends posteriorly abutting the vertebral body.  There are no lytic or sclerotic osseous lesions.  IMPRESSION: No acute abdominal or pelvic  pathology.   Electronically Signed   By: Elige Ko   On: 03/16/2013 13:40   Imaging Review No results found.  EKG Interpretation   None       MDM  No diagnosis found. 1. Back pain  Abdominal CT negative. No evidence to support filter dysfunction or cause of pain in abdomen and suspect that pain is originating in the back where she has chronic issues. No evidence of infection or acute abnormality, no deficits on exam. Pain is improved and VSS. Stable for discharge.   Arnoldo Hooker, PA-C 03/18/13 1400

## 2013-03-16 NOTE — ED Notes (Signed)
Pt in c/o pain to her right groin area that radiates into her back and down her right leg, states two months ago she had a filter placed there due to blood clots in her right leg, denies swelling or redness at site, was advised by PMD to come to ED.

## 2013-03-18 NOTE — ED Provider Notes (Signed)
Medical screening examination/treatment/procedure(s) were performed by non-physician practitioner and as supervising physician I was immediately available for consultation/collaboration.  EKG Interpretation   None         Shanna Cisco, MD 03/18/13 1624

## 2013-05-13 ENCOUNTER — Emergency Department (HOSPITAL_COMMUNITY)
Admission: EM | Admit: 2013-05-13 | Discharge: 2013-05-13 | Disposition: A | Discharge status: Home / Self Care | Payer: Medicaid Other | Attending: Emergency Medicine | Admitting: Emergency Medicine

## 2013-05-13 ENCOUNTER — Emergency Department (HOSPITAL_COMMUNITY): Payer: Medicaid Other

## 2013-05-13 ENCOUNTER — Encounter (HOSPITAL_COMMUNITY): Payer: Self-pay | Admitting: Emergency Medicine

## 2013-05-13 DIAGNOSIS — Z791 Long term (current) use of non-steroidal anti-inflammatories (NSAID): Secondary | ICD-10-CM | POA: Insufficient documentation

## 2013-05-13 DIAGNOSIS — IMO0001 Reserved for inherently not codable concepts without codable children: Secondary | ICD-10-CM | POA: Insufficient documentation

## 2013-05-13 DIAGNOSIS — Z91040 Latex allergy status: Secondary | ICD-10-CM | POA: Insufficient documentation

## 2013-05-13 DIAGNOSIS — I10 Essential (primary) hypertension: Secondary | ICD-10-CM | POA: Insufficient documentation

## 2013-05-13 DIAGNOSIS — G40919 Epilepsy, unspecified, intractable, without status epilepticus: Secondary | ICD-10-CM

## 2013-05-13 DIAGNOSIS — IMO0002 Reserved for concepts with insufficient information to code with codable children: Secondary | ICD-10-CM | POA: Insufficient documentation

## 2013-05-13 DIAGNOSIS — R51 Headache: Secondary | ICD-10-CM | POA: Insufficient documentation

## 2013-05-13 DIAGNOSIS — Z86718 Personal history of other venous thrombosis and embolism: Secondary | ICD-10-CM | POA: Insufficient documentation

## 2013-05-13 DIAGNOSIS — G40909 Epilepsy, unspecified, not intractable, without status epilepticus: Secondary | ICD-10-CM | POA: Insufficient documentation

## 2013-05-13 DIAGNOSIS — K219 Gastro-esophageal reflux disease without esophagitis: Secondary | ICD-10-CM | POA: Insufficient documentation

## 2013-05-13 DIAGNOSIS — Z9089 Acquired absence of other organs: Secondary | ICD-10-CM | POA: Insufficient documentation

## 2013-05-13 DIAGNOSIS — Z3202 Encounter for pregnancy test, result negative: Secondary | ICD-10-CM | POA: Insufficient documentation

## 2013-05-13 DIAGNOSIS — Z21 Asymptomatic human immunodeficiency virus [HIV] infection status: Secondary | ICD-10-CM | POA: Insufficient documentation

## 2013-05-13 DIAGNOSIS — Z8541 Personal history of malignant neoplasm of cervix uteri: Secondary | ICD-10-CM | POA: Insufficient documentation

## 2013-05-13 DIAGNOSIS — F17200 Nicotine dependence, unspecified, uncomplicated: Secondary | ICD-10-CM | POA: Insufficient documentation

## 2013-05-13 DIAGNOSIS — J45909 Unspecified asthma, uncomplicated: Secondary | ICD-10-CM | POA: Insufficient documentation

## 2013-05-13 DIAGNOSIS — Z79899 Other long term (current) drug therapy: Secondary | ICD-10-CM | POA: Insufficient documentation

## 2013-05-13 LAB — CBC WITH DIFFERENTIAL/PLATELET
Basophils Absolute: 0 10*3/uL (ref 0.0–0.1)
Basophils Relative: 0 % (ref 0–1)
EOS ABS: 0 10*3/uL (ref 0.0–0.7)
Eosinophils Relative: 1 % (ref 0–5)
HCT: 41.6 % (ref 36.0–46.0)
HEMOGLOBIN: 14.3 g/dL (ref 12.0–15.0)
Lymphocytes Relative: 43 % (ref 12–46)
Lymphs Abs: 2.6 10*3/uL (ref 0.7–4.0)
MCH: 31.8 pg (ref 26.0–34.0)
MCHC: 34.4 g/dL (ref 30.0–36.0)
MCV: 92.7 fL (ref 78.0–100.0)
Monocytes Absolute: 0.4 10*3/uL (ref 0.1–1.0)
Monocytes Relative: 7 % (ref 3–12)
Neutro Abs: 3 10*3/uL (ref 1.7–7.7)
Neutrophils Relative %: 50 % (ref 43–77)
Platelets: 202 10*3/uL (ref 150–400)
RBC: 4.49 MIL/uL (ref 3.87–5.11)
RDW: 13.4 % (ref 11.5–15.5)
WBC: 6 10*3/uL (ref 4.0–10.5)

## 2013-05-13 LAB — COMPREHENSIVE METABOLIC PANEL
ALK PHOS: 55 U/L (ref 39–117)
ALT: 12 U/L (ref 0–35)
AST: 16 U/L (ref 0–37)
Albumin: 3.8 g/dL (ref 3.5–5.2)
BILIRUBIN TOTAL: 0.3 mg/dL (ref 0.3–1.2)
BUN: 14 mg/dL (ref 6–23)
CHLORIDE: 100 meq/L (ref 96–112)
CO2: 26 mEq/L (ref 19–32)
Calcium: 9.2 mg/dL (ref 8.4–10.5)
Creatinine, Ser: 0.81 mg/dL (ref 0.50–1.10)
GFR calc Af Amer: >90 mL/min (ref >90)
Glucose, Bld: 74 mg/dL (ref 70–99)
Potassium: 4.2 mEq/L (ref 3.7–5.3)
Sodium: 138 mEq/L (ref 137–147)
TOTAL PROTEIN: 7.5 g/dL (ref 6.0–8.3)

## 2013-05-13 LAB — URINALYSIS, ROUTINE W REFLEX MICROSCOPIC
Bilirubin Urine: NEGATIVE
Glucose, UA: NEGATIVE mg/dL
KETONES UR: NEGATIVE mg/dL
Leukocytes, UA: NEGATIVE
NITRITE: NEGATIVE
PROTEIN: NEGATIVE mg/dL
Specific Gravity, Urine: 1.026 (ref 1.005–1.030)
UROBILINOGEN UA: 0.2 mg/dL (ref 0.0–1.0)
pH: 7 (ref 5.0–8.0)

## 2013-05-13 LAB — URINE MICROSCOPIC-ADD ON

## 2013-05-13 LAB — VALPROIC ACID LEVEL: Valproic Acid Lvl: 53 ug/mL (ref 50.0–100.0)

## 2013-05-13 LAB — PREGNANCY, URINE: Preg Test, Ur: NEGATIVE

## 2013-05-13 MED ORDER — ACETAMINOPHEN 325 MG PO TABS
650.0000 mg | ORAL_TABLET | Freq: Once | ORAL | Status: AC
  Administered 2013-05-13: 650 mg via ORAL
  Filled 2013-05-13: qty 2

## 2013-05-13 NOTE — ED Notes (Signed)
Patient presents to ED via EMS. Patient called EMs for headache and felt like her brain was swelling. Patient had 2 seizures with EMS. EMS gave 2.5mg  IV versed.

## 2013-05-13 NOTE — ED Provider Notes (Signed)
CSN: [id number]     Arrival date & time 05/13/13  1408 History   First MD Initiated Contact with Patient 05/13/13 1504     Chief Complaint  Patient presents with  . Seizures    HPI: Ms. Deschepper is a 38 yo F with history of seizure disorder, on Keppra and Depakote, meningioma s/p resection, chronic headaches and HTN who presents after apparent seizure this afternoon. She was arguing with her husband this morning. After he left, she developed a right sided headache. Pain described as aching, non-radiating, no alleviating or exacerbating factors. Pain was gradual in onset. She has had similar headaches prior to having a seizure. She then felt "shaky," which is typical prior to a seizure. She called 911. She does not recall further events until she arrived in the ED. Currently she complains of diffuse myalgias and headache as above. She does endorses productive cough for two days without associated fever. No neck pain, nausea, vomiting, abdominal pain, back pain, diarrhea or reported fever at home. EMS states she had two generalized seizures, was treated with 2.5 mg IV versed. She has been compliant with AED's.    Past Medical History  Diagnosis Date  . 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 Seizures     due to head trauma as adult  . Cervical cancer     2013, untreated  . Asthma     as child  . Meningioma   . Gallstones     s/p cholecystectomy  . HTN (hypertension)   . GERD (gastroesophageal reflux disease)   . Allergic rhinitis   . Cigarette nicotine dependence with withdrawal   . DVT (deep venous thrombosis)   . HIV (human immunodeficiency virus infection)    Past Surgical History  Procedure Laterality Date  . Brain surgery      2013 to remove a meningioma  . Cholecystectomy     Family History  Problem Relation Age of Onset  . Other Mother     varicose vein  . Asthma Mother   . High blood pressure Mother   . Deep vein thrombosis Neg Hx   . Pulmonary embolism Neg Hx   .  Cancer    . Diabetes    . High blood pressure    . Asthma    . Thyroid disease    . Cancer Father    History  Substance Use Topics  . Smoking status: Current Every Day Smoker -- 0.25 packs/day for 16 years    Types: Cigarettes  . Smokeless tobacco: Never Used  . Alcohol Use: No     Comment: Smokes 3 joints a day   OB History   Grav Para Term Preterm Abortions TAB SAB Ect Mult Living                 Review of Systems  Constitutional: Negative for fever, chills, appetite change and fatigue.  HENT: Negative for congestion.   Eyes: Negative for photophobia and visual disturbance.  Respiratory: Positive for cough (for two days). Negative for shortness of breath.   Cardiovascular: Negative for chest pain and leg swelling.  Gastrointestinal: Negative for nausea, vomiting, abdominal pain, diarrhea and constipation.  Genitourinary: Negative for dysuria, frequency and decreased urine volume.  Musculoskeletal: Positive for myalgias. Negative for arthralgias, back pain, gait problem, neck pain and neck stiffness.  Skin: Negative for color change, rash and wound.  Neurological: Positive for seizures and headaches. Negative for dizziness, syncope, weakness, light-headedness and  numbness.  Psychiatric/Behavioral: Negative for confusion and agitation.  All other systems reviewed and are negative.    Allergies  Ibuprofen; Clindamycin/lincomycin; Coconut flavor; Latex; Other; and Aspirin  Home Medications   Current Outpatient Rx  Name  Route  Sig  Dispense  Refill  . cyclobenzaprine (FLEXERIL) 10 MG tablet   Oral   Take 1 tablet (10 mg total) by mouth 2 (two) times daily as needed for muscle spasms.   2 tablet   0   . divalproex (DEPAKOTE) 500 MG DR tablet   Oral   Take 1 tablet (500 mg total) by mouth 3 (three) times daily.   90 tablet   5   . esomeprazole (NEXIUM) 40 MG capsule   Oral   Take 40 mg by mouth daily at 12 noon.         . folic acid (FOLVITE) 1 MG tablet    Oral   Take 1 mg by mouth daily.         Marland Kitchen gabapentin (NEURONTIN) 400 MG capsule   Oral   Take 2 capsules (800 mg total) by mouth 2 (two) times daily.   120 capsule   6   . hydrochlorothiazide (HYDRODIURIL) 25 MG tablet   Oral   Take 25 mg by mouth daily.         Marland Kitchen levETIRAcetam (KEPPRA) 1000 MG tablet   Oral   Take 1,000 mg by mouth 2 (two) times daily.          . mirtazapine (REMERON) 15 MG tablet   Oral   Take 15 mg by mouth at bedtime.         . naproxen (NAPROSYN) 500 MG tablet   Oral   Take 500 mg by mouth 2 (two) times daily with a meal.         . omeprazole (PRILOSEC) 20 MG capsule   Oral   Take 20 mg by mouth 2 (two) times daily.         Marland Kitchen PARoxetine (PAXIL) 20 MG tablet   Oral   Take 20 mg by mouth daily.          . promethazine (PHENERGAN) 25 MG tablet   Oral   Take 25 mg by mouth 2 (two) times daily as needed for nausea.         . propranolol (INDERAL) 10 MG tablet   Oral   Take 10 mg by mouth 2 (two) times daily.         Marland Kitchen triamcinolone ointment (KENALOG) 0.5 %   Topical   Apply 1 application topically 2 (two) times daily.         Marland Kitchen acetaminophen (TYLENOL) 500 MG tablet   Oral   Take 1,000 mg by mouth every 6 (six) hours as needed for moderate pain or headache.         . albuterol (PROVENTIL HFA;VENTOLIN HFA) 108 (90 BASE) MCG/ACT inhaler   Inhalation   Inhale 2 puffs into the lungs every 4 (four) hours as needed for wheezing or shortness of breath.          . beclomethasone (QVAR) 40 MCG/ACT inhaler   Inhalation   Inhale 2 puffs into the lungs 2 (two) times daily.          . benzocaine (HURRICAINE) 20 % oral spray   Topical   Apply 1 application topically 3 (three) times daily as needed for pain.         . diazepam (VALIUM) 10  MG tablet   Oral   Take 10 mg by mouth every 8 (eight) hours as needed for anxiety.          . fexofenadine-pseudoephedrine (ALLEGRA-D 24) 180-240 MG per 24 hr tablet   Oral   Take 1  tablet by mouth daily.         . fluticasone (FLONASE) 50 MCG/ACT nasal spray   Nasal   Place 1 spray into the nose 2 (two) times daily.         Marland Kitchen HYDROcodone-acetaminophen (NORCO) 10-325 MG per tablet   Oral   Take 2 tablets by mouth every 8 (eight) hours as needed for pain.         Marland Kitchen loratadine (CLARITIN) 10 MG tablet   Oral   Take 10 mg by mouth daily.         Marland Kitchen oxyCODONE-acetaminophen (PERCOCET/ROXICET) 5-325 MG per tablet   Oral   Take 1-2 tablets by mouth every 4 (four) hours as needed for severe pain.   20 tablet   0   . sodium chloride (OCEAN) 0.65 % nasal spray   Nasal   Place 1 spray into the nose 2 (two) times daily as needed for congestion.           BP 116/75  Pulse 78  Temp(Src) 100.1 F (37.8 C) (Oral)  Resp 13  SpO2 98% Physical Exam  Nursing note and vitals reviewed. Constitutional: She is oriented to person, place, and time. No distress.  Well appearing, middle age female, laying in bed, appears comfortable.   HENT:  Head: Normocephalic and atraumatic.  Mouth/Throat: Oropharynx is clear and moist and mucous membranes are normal.  Eyes: Conjunctivae and EOM are normal. Pupils are equal, round, and reactive to light.  Neck: Normal range of motion and full passive range of motion without pain. Neck supple.  Cardiovascular: Normal rate, regular rhythm, normal heart sounds and intact distal pulses.   Pulmonary/Chest: Effort normal and breath sounds normal. No respiratory distress.  Abdominal: Soft. Bowel sounds are normal. There is no tenderness. There is no rebound and no guarding.  Musculoskeletal: Normal range of motion. She exhibits no edema and no tenderness.  Neurological: She is alert and oriented to person, place, and time. She has normal strength and normal reflexes. No cranial nerve deficit or sensory deficit. Coordination normal. GCS eye subscore is 4. GCS verbal subscore is 5. GCS motor subscore is 6.  CN 3-12 without gross deficit.  Normal visual fields. Negative pronator drift. PERRL. 5/5 strength in all extremities. 2+ DTR's in all extremities. Normal finger to nose and heel to shin.   Skin: Skin is warm and dry. No rash noted.  Psychiatric: She has a normal mood and affect. Her behavior is normal.    ED Course  Procedures (including critical care time) Labs Review Labs Reviewed  URINALYSIS, ROUTINE W REFLEX MICROSCOPIC - Abnormal; Notable for the following:    Hgb urine dipstick SMALL (*)    All other components within normal limits  URINE MICROSCOPIC-ADD ON - Abnormal; Notable for the following:    Squamous Epithelial / LPF FEW (*)    Bacteria, UA FEW (*)    All other components within normal limits  URINE CULTURE  CBC WITH DIFFERENTIAL  COMPREHENSIVE METABOLIC PANEL  VALPROIC ACID LEVEL  PREGNANCY, URINE  INFLUENZA PANEL BY PCR (TYPE A & B, H1N1)   Imaging Review Dg Chest 2 View  05/13/2013   CLINICAL DATA:  Low grade fever, seizure  EXAM: CHEST  2 VIEW  COMPARISON:  DG CHEST 1 VIEW dated 02/01/2013  FINDINGS: There are low lung volumes with crowding of the interstitial markings. There is no focal parenchymal opacity, pleural effusion, or pneumothorax. The heart and mediastinal contours are unremarkable.  The osseous structures are unremarkable.  IMPRESSION: No active cardiopulmonary disease.   Electronically Signed   By: Kathreen Devoid   On: 05/13/2013 16:13    EKG Interpretation   None       MDM   38 yo F with history of seizure disorder and meningeoma s/p resection who presents with two seizures per EMS. On arrival she is sleepy, but easily aroused, alert and oriented. She does complain of right sided headache, but headache gradual in onset, typical of previous HA's, no neurologic deficits or neck pain. Doubt meningitis, SAH or recurrent mass. She had a CT scan three months ago that was unchanged. Did have low grade temp, UA without evidence of infection, CXR without PNA. Her CMP and CBC were normal.  Depakote level 53. Felt the likely cause of her seizures today were due to poor sleep and stress. She was monitored in the ED for several hours, no recurrent seizure activity. Her mental status returned to baseline, she was able to ambulate without difficulty. Felt she was stable for outpatient management. Advised close Neurology f/u. Return precautions given. She was in agreement with plan and voiced understanding.   Reviewed imaging, labs and previous medical records, utilized in MDM  Discussed case with Dr. Leonides Schanz  Clinical Impression 1. Breakthrough seizure.    Louretta Shorten, MD 05/14/13 [phone]

## 2013-05-13 NOTE — Discharge Instructions (Signed)
Continue to take your medications as prescribed. Call your primary neurologist tomorrow to arrange follow up appointment.   Seizure, Adult A seizure is abnormal electrical activity in the brain. Seizures usually last from 30 seconds to 2 minutes. There are various types of seizures. Before a seizure, you may have a warning sensation (aura) that a seizure is about to occur. An aura may include the following symptoms:   Fear or anxiety.  Nausea.  Feeling like the room is spinning (vertigo).  Vision changes, such as seeing flashing lights or spots. Common symptoms during a seizure include:  A change in attention or behavior (altered mental status).  Convulsions with rhythmic jerking movements.  Drooling.  Rapid eye movements.  Grunting.  Loss of bladder and bowel control.  Bitter taste in the mouth.  Tongue biting. After a seizure, you may feel confused and sleepy. You may also have an injury resulting from convulsions during the seizure. HOME CARE INSTRUCTIONS   If you are given medicines, take them exactly as prescribed by your health care provider.  Keep all follow-up appointments as directed by your health care provider.  Do not swim or drive or engage in risky activity during which a seizure could cause further injury to you or others until your health care provider says it is OK.  Get adequate rest.  Teach friends and family what to do if you have a seizure. They should:  Lay you on the ground to prevent a fall.  Put a cushion under your head.  Loosen any tight clothing around your neck.  Turn you on your side. If vomiting occurs, this helps keep your airway clear.  Stay with you until you recover.  Know whether or not you need emergency care. SEEK IMMEDIATE MEDICAL CARE IF:  The seizure lasts longer than 5 minutes.  The seizure is severe or you do not wake up immediately after the seizure.  You have an altered mental status after the seizure.  You are  having more frequent or worsening seizures. Someone should drive you to the emergency department or call local emergency services (911 in [place] MAKE SURE YOU:  Understand these instructions.  Will watch your condition.  Will get help right away if you are not doing well or get worse. Document Released: 03/29/2000 Document Revised: 01/20/2013 Document Reviewed: 11/11/2012 [facility] Patient Information 2014 [employer].
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
## 2013-05-14 LAB — INFLUENZA PANEL BY PCR (TYPE A & B)
H1N1 flu by pcr: NOT DETECTED
Influenza A By PCR: NEGATIVE
Influenza B By PCR: NEGATIVE

## 2013-05-14 LAB — URINE CULTURE: Colony Count: 90000

## 2013-05-24 ENCOUNTER — Encounter: Payer: Self-pay | Admitting: *Deleted

## 2013-05-31 ENCOUNTER — Encounter: Payer: PRIVATE HEALTH INSURANCE | Admitting: Obstetrics & Gynecology

## 2013-07-07 NOTE — ED Provider Notes (Signed)
Medical screening examination/treatment/procedure(s) were performed by non-physician practitioner and as supervising physician I was immediately available for consultation/collaboration.   EKG Interpretation None        Delice Bison Dlisa Barnwell, DO 07/07/13 1648

## 2013-07-09 ENCOUNTER — Encounter (HOSPITAL_COMMUNITY): Payer: Self-pay | Admitting: Emergency Medicine

## 2013-07-09 ENCOUNTER — Emergency Department (HOSPITAL_COMMUNITY)
Admission: EM | Admit: 2013-07-09 | Discharge: 2013-07-09 | Disposition: A | Discharge status: Home / Self Care | Payer: Medicaid Other | Attending: Emergency Medicine | Admitting: Emergency Medicine

## 2013-07-09 ENCOUNTER — Emergency Department (HOSPITAL_COMMUNITY): Payer: Medicaid Other

## 2013-07-09 DIAGNOSIS — K219 Gastro-esophageal reflux disease without esophagitis: Secondary | ICD-10-CM | POA: Insufficient documentation

## 2013-07-09 DIAGNOSIS — Z79899 Other long term (current) drug therapy: Secondary | ICD-10-CM | POA: Insufficient documentation

## 2013-07-09 DIAGNOSIS — G8929 Other chronic pain: Secondary | ICD-10-CM | POA: Insufficient documentation

## 2013-07-09 DIAGNOSIS — F411 Generalized anxiety disorder: Secondary | ICD-10-CM | POA: Insufficient documentation

## 2013-07-09 DIAGNOSIS — Y92410 Unspecified street and highway as the place of occurrence of the external cause: Secondary | ICD-10-CM | POA: Insufficient documentation

## 2013-07-09 DIAGNOSIS — G40909 Epilepsy, unspecified, not intractable, without status epilepticus: Secondary | ICD-10-CM | POA: Insufficient documentation

## 2013-07-09 DIAGNOSIS — IMO0002 Reserved for concepts with insufficient information to code with codable children: Secondary | ICD-10-CM | POA: Insufficient documentation

## 2013-07-09 DIAGNOSIS — Z760 Encounter for issue of repeat prescription: Secondary | ICD-10-CM

## 2013-07-09 DIAGNOSIS — Z87828 Personal history of other (healed) physical injury and trauma: Secondary | ICD-10-CM | POA: Insufficient documentation

## 2013-07-09 DIAGNOSIS — Z91040 Latex allergy status: Secondary | ICD-10-CM | POA: Insufficient documentation

## 2013-07-09 DIAGNOSIS — S8000XA Contusion of unspecified knee, initial encounter: Secondary | ICD-10-CM

## 2013-07-09 DIAGNOSIS — J45909 Unspecified asthma, uncomplicated: Secondary | ICD-10-CM | POA: Insufficient documentation

## 2013-07-09 DIAGNOSIS — M5430 Sciatica, unspecified side: Secondary | ICD-10-CM | POA: Insufficient documentation

## 2013-07-09 DIAGNOSIS — M5442 Lumbago with sciatica, left side: Secondary | ICD-10-CM

## 2013-07-09 DIAGNOSIS — F17200 Nicotine dependence, unspecified, uncomplicated: Secondary | ICD-10-CM | POA: Insufficient documentation

## 2013-07-09 DIAGNOSIS — I10 Essential (primary) hypertension: Secondary | ICD-10-CM | POA: Insufficient documentation

## 2013-07-09 DIAGNOSIS — Y9389 Activity, other specified: Secondary | ICD-10-CM | POA: Insufficient documentation

## 2013-07-09 DIAGNOSIS — Z86718 Personal history of other venous thrombosis and embolism: Secondary | ICD-10-CM | POA: Insufficient documentation

## 2013-07-09 DIAGNOSIS — Z8541 Personal history of malignant neoplasm of cervix uteri: Secondary | ICD-10-CM | POA: Insufficient documentation

## 2013-07-09 MED ORDER — DIVALPROEX SODIUM 500 MG PO DR TAB: 500.0000 mg | DELAYED_RELEASE_TABLET | Freq: Three times a day (TID) | ORAL | Status: DC

## 2013-07-09 MED ORDER — PAROXETINE HCL 40 MG PO TABS: 40.0000 mg | ORAL_TABLET | Freq: Every day | ORAL | Status: DC

## 2013-07-09 MED ORDER — HYDROCHLOROTHIAZIDE 25 MG PO TABS: 25.0000 mg | ORAL_TABLET | Freq: Every day | ORAL | Status: DC

## 2013-07-09 MED ORDER — PROPRANOLOL HCL 20 MG PO TABS: 20.0000 mg | ORAL_TABLET | Freq: Two times a day (BID) | ORAL | Status: DC

## 2013-07-09 MED ORDER — LEVETIRACETAM 1000 MG PO TABS: 1000.0000 mg | ORAL_TABLET | Freq: Two times a day (BID) | ORAL | Status: DC

## 2013-07-09 NOTE — ED Notes (Signed)
Pt reports she has been having some lower back pain over the past couple of days. Reports she has a hx of chronic back pain and is out of her medication. States she was getting on the bus this morning when her left leg gave out. States she also has a hx of blood clots in the left leg.

## 2013-07-09 NOTE — Discharge Instructions (Signed)
Call for a follow up appointment Dr. Jimmye Norman for medication refill. Call Dr. Doran Durand for further evaluation of your knee pain. Return if Symptoms worsen.   Take medication as prescribed.  Ice your knee and back 3-4 times a day.

## 2013-07-09 NOTE — ED Notes (Signed)
Pt given bus pass, coffee, and water.

## 2013-07-09 NOTE — ED Provider Notes (Signed)
CSN: [id number]     Arrival date & time 07/09/13  0944 History  This chart was scribed for non-physician practitioner, Lorrine Kin, PA-C, working with Richarda Blade, MD by Roe Coombs, ED Scribe. This patient was seen in room TR09C/TR09C and the patient's care was started at 9:52 AM.  Chief Complaint  Patient presents with  . Back Pain  . Leg Pain     The history is provided by the patient. No language interpreter was used.    HPI Comments: Lori Ford is a 38 y.o. female with a history of back pain who presents to the Emergency Department complaining of constant moderate mid lower back pain that radiates posteriorly down her left upper leg onset 3 days ago. Patient further reports that today she was trying to get on the bus this morning when her leg "gave out" causing her to fall straight down onto her left knee. She is now having anterior left knee pain as well. She has no prior history of left knee injury. She denies associated numbness in her left lower extremity. She denies head injury or LOC. She states that she hasn't taken Percocet to manage her pain because she left her pills at her mother's house. She has run out of all of her other medications including anxiety medication (last Diazepam was yesterday) and antiseizure medications (she has not take Keppra or Depakote for the past 2 days). She tried calling Dr. Jimmye Norman, her PCP, for refills yesterday, but he says that he could not do this unless she came in for an appointment. She has never seen a back specialist for her pain. She has a history of seizures, DVT, HTN, anxiety. She denies history of HIV.   Past Medical History  Diagnosis Date  . 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 Seizures     due to head trauma as adult  . Cervical cancer     2013, untreated  . Asthma     as child  . Meningioma   . Gallstones     s/p cholecystectomy  . HTN (hypertension)   . GERD (gastroesophageal reflux disease)   . Allergic  rhinitis   . Cigarette nicotine dependence with withdrawal   . DVT (deep venous thrombosis)    Past Surgical History  Procedure Laterality Date  . Brain surgery      2013 to remove a meningioma  . Cholecystectomy     Family History  Problem Relation Age of Onset  . Other Mother     varicose vein  . Asthma Mother   . High blood pressure Mother   . Deep vein thrombosis Neg Hx   . Pulmonary embolism Neg Hx   . Cancer    . Diabetes    . High blood pressure    . Asthma    . Thyroid disease    . Cancer Father    History  Substance Use Topics  . Smoking status: Current Every Day Smoker -- 0.25 packs/day for 16 years    Types: Cigarettes  . Smokeless tobacco: Never Used  . Alcohol Use: No     Comment: Smokes 3 joints a day   OB History   Grav Para Term Preterm Abortions TAB SAB Ect Mult Living                 Review of Systems  Constitutional: Negative for fever.  Musculoskeletal: Positive for arthralgias, back pain and gait problem.  Skin: Negative  for color change and wound.  Neurological: Negative for weakness, numbness and headaches.      Allergies  Ibuprofen; Clindamycin/lincomycin; Coconut flavor; Latex; Other; and Aspirin  Home Medications   Current Outpatient Rx  Name  Route  Sig  Dispense  Refill  . acetaminophen (TYLENOL) 500 MG tablet   Oral   Take 1,000 mg by mouth every 6 (six) hours as needed for moderate pain or headache.         . albuterol (PROVENTIL HFA;VENTOLIN HFA) 108 (90 BASE) MCG/ACT inhaler   Inhalation   Inhale 2 puffs into the lungs every 4 (four) hours as needed for wheezing or shortness of breath.          . beclomethasone (QVAR) 40 MCG/ACT inhaler   Inhalation   Inhale 2 puffs into the lungs 2 (two) times daily.          . benzocaine (HURRICAINE) 20 % oral spray   Topical   Apply 1 application topically 3 (three) times daily as needed for pain.         . cyclobenzaprine (FLEXERIL) 10 MG tablet   Oral   Take 1 tablet  (10 mg total) by mouth 2 (two) times daily as needed for muscle spasms.   2 tablet   0   . diazepam (VALIUM) 10 MG tablet   Oral   Take 10 mg by mouth every 8 (eight) hours as needed for anxiety.          . divalproex (DEPAKOTE) 500 MG DR tablet   Oral   Take 1 tablet (500 mg total) by mouth 3 (three) times daily.   90 tablet   5   . esomeprazole (NEXIUM) 40 MG capsule   Oral   Take 40 mg by mouth daily at 12 noon.         . fexofenadine-pseudoephedrine (ALLEGRA-D 24) 180-240 MG per 24 hr tablet   Oral   Take 1 tablet by mouth daily.         . fluticasone (FLONASE) 50 MCG/ACT nasal spray   Nasal   Place 1 spray into the nose 2 (two) times daily.         . folic acid (FOLVITE) 1 MG tablet   Oral   Take 1 mg by mouth daily.         Marland Kitchen gabapentin (NEURONTIN) 400 MG capsule   Oral   Take 2 capsules (800 mg total) by mouth 2 (two) times daily.   120 capsule   6   . hydrochlorothiazide (HYDRODIURIL) 25 MG tablet   Oral   Take 25 mg by mouth daily.         Marland Kitchen HYDROcodone-acetaminophen (NORCO) 10-325 MG per tablet   Oral   Take 2 tablets by mouth every 8 (eight) hours as needed for pain.         Marland Kitchen levETIRAcetam (KEPPRA) 1000 MG tablet   Oral   Take 1,000 mg by mouth 2 (two) times daily.          Marland Kitchen loratadine (CLARITIN) 10 MG tablet   Oral   Take 10 mg by mouth daily.         . mirtazapine (REMERON) 15 MG tablet   Oral   Take 15 mg by mouth at bedtime.         . naproxen (NAPROSYN) 500 MG tablet   Oral   Take 500 mg by mouth 2 (two) times daily with a meal.         .  omeprazole (PRILOSEC) 20 MG capsule   Oral   Take 20 mg by mouth 2 (two) times daily.         Marland Kitchen oxyCODONE-acetaminophen (PERCOCET/ROXICET) 5-325 MG per tablet   Oral   Take 1-2 tablets by mouth every 4 (four) hours as needed for severe pain.   20 tablet   0   . PARoxetine (PAXIL) 20 MG tablet   Oral   Take 20 mg by mouth daily.          . promethazine (PHENERGAN) 25  MG tablet   Oral   Take 25 mg by mouth 2 (two) times daily as needed for nausea.         . propranolol (INDERAL) 10 MG tablet   Oral   Take 10 mg by mouth 2 (two) times daily.         . sodium chloride (OCEAN) 0.65 % nasal spray   Nasal   Place 1 spray into the nose 2 (two) times daily as needed for congestion.          . triamcinolone ointment (KENALOG) 0.5 %   Topical   Apply 1 application topically 2 (two) times daily.          Triage Vitals: BP 145/89  Pulse 89  Temp(Src) 97 F (36.1 C) (Oral)  Resp 18  Ht 5\' 4"  (1.626 m)  Wt 235 lb (106.595 kg)  BMI 40.32 kg/m2  SpO2 97% Physical Exam  Nursing note and vitals reviewed. Constitutional: She is oriented to person, place, and time. She appears well-developed and well-nourished. No distress.  Exam limited by patient's body habitus.    HENT:  Head: Normocephalic and atraumatic.  Eyes: EOM are normal.  Neck: Neck supple.  Pulmonary/Chest: Effort normal. No respiratory distress.  Musculoskeletal: Normal range of motion.       Left knee: She exhibits normal range of motion, no swelling, no effusion, no deformity, no laceration and no erythema. Tenderness found. Patellar tendon tenderness noted.       Legs: No midline C-spine, T-spine, or L-spine tenderness with no step-offs, crepitus, or deformities noted. Palpation of the left SI joint recreates discomfort in her left leg. Normal sensation.  Good and equal strength bilaterally.  Neurological: She is alert and oriented to person, place, and time. She has normal strength. She displays no tremor. She exhibits normal muscle tone. Gait normal.  Reflex Scores:      Patellar reflexes are 1+ on the right side and 1+ on the left side.      Achilles reflexes are 1+ on the right side and 1+ on the left side. Skin: Skin is warm and dry. She is not diaphoretic.  Psychiatric: She has a normal mood and affect. Her behavior is normal.    ED Course  Procedures (including  critical care time)  COORDINATION OF CARE: 10:17 AM- Patient informed of current plan for treatment and evaluation and agrees with plan at this time.     Imaging Review Dg Knee Complete 4 Views Left  07/09/2013   CLINICAL DATA:  Pain status post trauma  EXAM: LEFT KNEE - COMPLETE 4+ VIEW  COMPARISON:  None.  FINDINGS: There is no evidence of fracture, dislocation, or joint effusion. Mild areas of peripheral hypertrophic spurring along the articular portion of the patella. Soft tissues unremarkable.  IMPRESSION: Mild osteoarthritic changes without evidence of acute osseous abnormalities.   Electronically Signed   By: Margaree Mackintosh M.D.   On: 07/09/2013 10:40  MDM   Final diagnoses:  Low back pain with left-sided sciatica  Knee contusion  Medication refill   Pt with chronic low back pain.  Normal gait and no deficits on exam. Reports fall to left knee, XR normal. Also requesting medication refill. Will prescribe HTN, depression and seizure medication.  I advised her to follow up with her PCP for further mediation refill. Patient with back pain.  No neurological deficits and normal neuro exam.  Patient can walk but states is painful.  No loss of bowel or bladder control.  No concern for cauda equina.  RICE protocol and pain medicine indicated and discussed with patient.   Meds given in ED:  Medications - No data to display  Discharge Medication List as of 07/09/2013 11:17 AM      levETIRAcetam (KEPPRA) 1000 MG tablet 2 times daily Discontinue Reprint   PARoxetine (PAXIL) 40 MG tablet Daily Discontinue Reprint   propranolol (INDERAL) 20 MG tablet 2 times daily Discontinue Reprint   hydrochlorothiazide (HYDRODIURIL) 25 MG tablet Daily Discontinue Reprint   divalproex (DEPAKOTE) 500 MG DR tablet 3 times daily Discontinue Reprint    I personally performed the services described in this documentation, which was scribed in my presence. The recorded information has been reviewed and  is accurate.  Lorrine Kin, PA-C 07/10/13 [phone]

## 2013-07-11 NOTE — ED Provider Notes (Signed)
Medical screening examination/treatment/procedure(s) were performed by non-physician practitioner and as supervising physician I was immediately available for consultation/collaboration.   EKG Interpretation None       Bernetha Anschutz L Rebekah Sprinkle, MD 07/11/13 2121 

## 2013-07-12 ENCOUNTER — Ambulatory Visit (INDEPENDENT_AMBULATORY_CARE_PROVIDER_SITE_OTHER): Payer: Medicaid Other | Admitting: Obstetrics & Gynecology

## 2013-07-12 ENCOUNTER — Encounter: Payer: Self-pay | Admitting: Obstetrics & Gynecology

## 2013-07-12 VITALS — BP 126/87 | HR 93 | Temp 98.9°F | Ht 64.0 in | Wt 246.4 lb

## 2013-07-12 DIAGNOSIS — N393 Stress incontinence (female) (male): Secondary | ICD-10-CM

## 2013-07-12 NOTE — Patient Instructions (Signed)
Urinary Incontinence Urinary incontinence is the involuntary loss of urine from your bladder. CAUSES  There are many causes of urinary incontinence. They include:  Medicines.  Infections.  Prostatic enlargement, leading to overflow of urine from your bladder.  Surgery.  Neurological diseases.  Emotional factors. SIGNS AND SYMPTOMS Urinary Incontinence can be divided into four types: 1. Urge incontinence. Urge incontinence is the involuntary loss of urine before you have the opportunity to go to the bathroom. There is a sudden urge to void but not enough time to reach a bathroom. 2. Stress incontinence. Stress incontinence is the sudden loss of urine with any activity that forces urine to pass. It is commonly caused by anatomical changes to the pelvis and sphincter areas of your body. 3. Overflow incontinence. Overflow incontinence is the loss of urine from an obstructed opening to your bladder. This results in a backup of urine and a resultant buildup of pressure within the bladder. When the pressure within the bladder exceeds the closing pressure of the sphincter, the urine overflows, which causes incontinence, similar to water overflowing a dam. 4. Total incontinence. Total incontinence is the loss of urine as a result of the inability to store urine within your bladder. DIAGNOSIS  Evaluating the cause of incontinence may require:  A thorough and complete medical and obstetric history.  A complete physical exam.  Laboratory tests such as a urine culture and sensitivities. When additional tests are indicated, they can include:  An ultrasound exam.  Kidney and bladder X-rays.  Cystoscopy. This is an exam of the bladder using a narrow scope.  Urodynamic testing to test the nerve function to the bladder and sphincter areas. TREATMENT  Treatment for urinary incontinence depends on the cause:  For urge incontinence caused by a bacterial infection, antibiotics will be prescribed.  If the urge incontinence is related to medicines you take, your health care provider may have you change the medicine.  For stress incontinence, surgery to re-establish anatomical support to the bladder or sphincter, or both, will often correct the condition.  For overflow incontinence caused by an enlarged prostate, an operation to open the channel through the enlarged prostate will allow the flow of urine out of the bladder. In women with fibroids, a hysterectomy may be recommended.  For total incontinence, surgery on your urinary sphincter may help. An artificial urinary sphincter (an inflatable cuff placed around the urethra) may be required. In women who have developed a hole-like passage between their bladder and vagina (vesicovaginal fistula), surgery to close the fistula often is required. HOME CARE INSTRUCTIONS  Normal daily hygiene and the use of pads or adult diapers that are changed regularly will help prevent odors and skin damage.  Avoid caffeine. It can overstimulate your bladder.  Use the bathroom regularly. Try about every 2 3 hours to go to the bathroom, even if you do not feel the need to do so. Take time to empty your bladder completely. After urinating, wait a minute. Then try to urinate again.  For causes involving nerve dysfunction, keep a log of the medicines you take and a journal of the times you go to the bathroom. SEEK MEDICAL CARE IF:  You experience worsening of pain instead of improvement in pain after your procedure.  Your incontinence becomes worse instead of better. SEE IMMEDIATE MEDICAL CARE IF:  You experience fever or shaking chills.  You are unable to pass your urine.  You have redness spreading into your groin or down into your thighs. MAKE   SURE YOU:   Understand these instructions.   Will watch your condition.  Will get help right away if you are not doing well or get worse. Document Released: 05/09/2004 Document Revised: 01/20/2013 Document  Reviewed: 09/08/2012 [employer] Patient Information 2014 [employer].  

## 2013-07-12 NOTE — Progress Notes (Signed)
Subjective:     Patient ID: Lori Ford, female   DOB: 1976-03-22, 38 y.o.   MRN: [id number]  HPI Pt is a [id number] (h/o 7 month twin demise) presents with h/o stress urinary incontinence.   She reports leakage of urine with coughing or laughing and when she finally goes she sometimes has difficulty voiding (it appears that this is resolved if she puts her fingers in).  No other urinary sx  Past Medical History  Diagnosis Date  . 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 Seizures     due to head trauma as adult  . Cervical cancer     2013, untreated  . Asthma     as child  . Meningioma   . Gallstones     s/p cholecystectomy  . HTN (hypertension)   . GERD (gastroesophageal reflux disease)   . Allergic rhinitis   . Cigarette nicotine dependence with withdrawal   . DVT (deep venous thrombosis)    Past Surgical History  Procedure Laterality Date  . Brain surgery      2013 to remove a meningioma  . Cholecystectomy     Current Outpatient Prescriptions on File Prior to Visit  Medication Sig Dispense Refill  . acetaminophen (TYLENOL) 500 MG tablet Take 1,000 mg by mouth every 6 (six) hours as needed for moderate pain or headache.      . albuterol (PROVENTIL HFA;VENTOLIN HFA) 108 (90 BASE) MCG/ACT inhaler Inhale 2 puffs into the lungs every 4 (four) hours as needed for wheezing or shortness of breath.       Marland Kitchen albuterol (PROVENTIL) (2.5 MG/3ML) 0.083% nebulizer solution Take 2.5 mg by nebulization every 6 (six) hours as needed for wheezing or shortness of breath.      . beclomethasone (QVAR) 40 MCG/ACT inhaler Inhale 2 puffs into the lungs 2 (two) times daily.       . benzocaine (HURRICAINE) 20 % oral spray Apply 1 application topically 3 (three) times daily as needed for pain.      . cyclobenzaprine (FLEXERIL) 10 MG tablet Take 1 tablet (10 mg total) by mouth 2 (two) times daily as needed for muscle spasms.  2 tablet  0  . diazepam (VALIUM) 10 MG tablet Take 10 mg by mouth every 8  (eight) hours as needed for anxiety.       . divalproex (DEPAKOTE) 500 MG DR tablet Take 1 tablet (500 mg total) by mouth 3 (three) times daily.  90 tablet  5  . fexofenadine-pseudoephedrine (ALLEGRA-D 24) 180-240 MG per 24 hr tablet Take 1 tablet by mouth daily.      . fluticasone (FLONASE) 50 MCG/ACT nasal spray Place 1 spray into the nose 2 (two) times daily.      . folic acid (FOLVITE) 1 MG tablet Take 1 mg by mouth daily.      Marland Kitchen gabapentin (NEURONTIN) 400 MG capsule Take 2 capsules (800 mg total) by mouth 2 (two) times daily.  120 capsule  6  . hydrochlorothiazide (HYDRODIURIL) 25 MG tablet Take 1 tablet (25 mg total) by mouth daily.  30 tablet  0  . levETIRAcetam (KEPPRA) 1000 MG tablet Take 1 tablet (1,000 mg total) by mouth 2 (two) times daily.  30 tablet  0  . loratadine (CLARITIN) 10 MG tablet Take 10 mg by mouth daily.      . mirtazapine (REMERON) 15 MG tablet Take 15 mg by mouth at bedtime.      Marland Kitchen omeprazole (PRILOSEC)  20 MG capsule Take 20 mg by mouth 2 (two) times daily.      . Oxycodone HCl 10 MG TABS Take 10 mg by mouth 3 (three) times daily.      Marland Kitchen PARoxetine (PAXIL) 40 MG tablet Take 1 tablet (40 mg total) by mouth daily.  30 tablet  0  . promethazine (PHENERGAN) 6.25 MG/5ML syrup Take 12.5 mg by mouth every 6 (six) hours as needed for nausea or vomiting.      . propranolol (INDERAL) 20 MG tablet Take 1 tablet (20 mg total) by mouth 2 (two) times daily.  60 tablet  0  . sodium chloride (OCEAN) 0.65 % nasal spray Place 1 spray into the nose 2 (two) times daily as needed for congestion.       . triamcinolone ointment (KENALOG) 0.5 % Apply 1 application topically 2 (two) times daily.       No current facility-administered medications on file prior to visit.   Allergies  Allergen Reactions  . Ibuprofen Shortness Of Breath    wheezing  . Clindamycin/Lincomycin Hives  . Coconut Flavor Hives  . Latex Other (See Comments)    Rash, wheezing  . Other Swelling    "GRAPE SUBSTANCE"   . Aspirin Palpitations    wheezing       Review of Systems     Objective:   Physical Exam BP 126/87  Pulse 93  Temp(Src) 98.9 F (37.2 C) (Oral)  Ht 5\' 4"  (1.626 m)  Wt 246 lb 6.4 oz (111.766 kg)  BMI 42.27 kg/m2  LMP 07/02/2013 Pt in NAD GU: EGBUS: no lesions Vagina: no blood in vault; cystocele grade I-II; Q-tip test 40-50 degrees Cervix: no lesion; no mucopurulent d/c; No CMT Uterus: small, mobile Adnexa: no masses; non tender          Assessment:     SUI- d/w Dr. Hulan Fray.      Plan:      Will refer pt to Dr. Clovia Cuff for further eval and treatment

## 2013-07-29 ENCOUNTER — Emergency Department (HOSPITAL_COMMUNITY)
Admission: EM | Admit: 2013-07-29 | Discharge: 2013-07-29 | Disposition: A | Discharge status: Home / Self Care | Payer: Medicaid Other | Attending: Emergency Medicine | Admitting: Emergency Medicine

## 2013-07-29 ENCOUNTER — Encounter (HOSPITAL_COMMUNITY): Payer: Self-pay | Admitting: Emergency Medicine

## 2013-07-29 DIAGNOSIS — G8929 Other chronic pain: Secondary | ICD-10-CM | POA: Insufficient documentation

## 2013-07-29 DIAGNOSIS — Z8739 Personal history of other diseases of the musculoskeletal system and connective tissue: Secondary | ICD-10-CM | POA: Insufficient documentation

## 2013-07-29 DIAGNOSIS — K219 Gastro-esophageal reflux disease without esophagitis: Secondary | ICD-10-CM | POA: Insufficient documentation

## 2013-07-29 DIAGNOSIS — G40309 Generalized idiopathic epilepsy and epileptic syndromes, not intractable, without status epilepticus: Secondary | ICD-10-CM | POA: Insufficient documentation

## 2013-07-29 DIAGNOSIS — Z79899 Other long term (current) drug therapy: Secondary | ICD-10-CM | POA: Insufficient documentation

## 2013-07-29 DIAGNOSIS — Z91040 Latex allergy status: Secondary | ICD-10-CM | POA: Insufficient documentation

## 2013-07-29 DIAGNOSIS — Z86718 Personal history of other venous thrombosis and embolism: Secondary | ICD-10-CM | POA: Insufficient documentation

## 2013-07-29 DIAGNOSIS — M25569 Pain in unspecified knee: Secondary | ICD-10-CM | POA: Insufficient documentation

## 2013-07-29 DIAGNOSIS — M79605 Pain in left leg: Secondary | ICD-10-CM

## 2013-07-29 DIAGNOSIS — IMO0002 Reserved for concepts with insufficient information to code with codable children: Secondary | ICD-10-CM | POA: Insufficient documentation

## 2013-07-29 DIAGNOSIS — I10 Essential (primary) hypertension: Secondary | ICD-10-CM | POA: Insufficient documentation

## 2013-07-29 DIAGNOSIS — Z8541 Personal history of malignant neoplasm of cervix uteri: Secondary | ICD-10-CM | POA: Insufficient documentation

## 2013-07-29 DIAGNOSIS — J45909 Unspecified asthma, uncomplicated: Secondary | ICD-10-CM | POA: Insufficient documentation

## 2013-07-29 DIAGNOSIS — M7989 Other specified soft tissue disorders: Secondary | ICD-10-CM

## 2013-07-29 DIAGNOSIS — M79609 Pain in unspecified limb: Secondary | ICD-10-CM

## 2013-07-29 DIAGNOSIS — F17200 Nicotine dependence, unspecified, uncomplicated: Secondary | ICD-10-CM | POA: Insufficient documentation

## 2013-07-29 HISTORY — DX: Sciatica, unspecified side: M54.30

## 2013-07-29 HISTORY — DX: Other chronic pain: G89.29

## 2013-07-29 HISTORY — DX: Dorsalgia, unspecified: M54.9

## 2013-07-29 HISTORY — DX: Unspecified osteoarthritis, unspecified site: M19.90

## 2013-07-29 MED ORDER — OXYCODONE-ACETAMINOPHEN 5-325 MG PO TABS
2.0000 | ORAL_TABLET | Freq: Once | ORAL | Status: AC
  Administered 2013-07-29: 2 via ORAL
  Filled 2013-07-29: qty 2

## 2013-07-29 NOTE — Progress Notes (Signed)
VASCULAR LAB PRELIMINARY  PRELIMINARY  PRELIMINARY  PRELIMINARY  Left lower extremity venous Doppler completed.    Preliminary report:  There is no DVT or SVT noted in the left lower extremity.  Iantha Fallen, RVT 07/29/2013, 2:00 PM

## 2013-07-29 NOTE — ED Notes (Signed)
To x-ray

## 2013-07-29 NOTE — ED Notes (Addendum)
Has hx  Blood clots in left leg in aug   has a filter  Now having left leg pain x 3-4 days states it is swelling  Has seen [facility] in [place] stopped taking lovenox in aug14 after fliter was placed

## 2013-07-29 NOTE — ED Notes (Signed)
Pt in xray
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
## 2013-07-29 NOTE — ED Notes (Addendum)
Pt given bus pass as requested

## 2013-07-29 NOTE — ED Notes (Signed)
THE PT WOULD NOT LIKE ANY OF HER MEDICAL HISTORY BESIDES TODAYS COMPLAINT DISCUSSED IN FRONT OF HER VISITORS

## 2013-07-29 NOTE — ED Provider Notes (Signed)
Medical screening examination/treatment/procedure(s) were performed by non-physician practitioner and as supervising physician I was immediately available for consultation/collaboration.   EKG Interpretation None        Mervin Kung, MD 07/29/13 [phone]

## 2013-07-29 NOTE — Discharge Instructions (Signed)

## 2013-07-29 NOTE — ED Provider Notes (Signed)
CSN: [id number]     Arrival date & time 07/29/13  1219 History   First MD Initiated Contact with Patient 07/29/13 1303     Chief Complaint  Patient presents with  . Leg Pain     (Consider location/radiation/quality/duration/timing/severity/associated sxs/prior Treatment) HPI  38 year-old female with reported prior history of meningioma, DVT in left leg diagnosed last June and was started on Lovenox but discontinued in August after IVC filter placed, who presents complaining of pain as well as left leg concern for DVT. Patient reports for the past 3 days she has had persistent pain throughout her left leg. Describe pain as a sharp achy sensation that felt similar to her prior DVT. She also complaining of pain to her left knee worsening with walking and with bending. States she felt a pop in her knee she walks she denies any specific injury to the knee. She complains of her hip. She denies any numbness or weakness. She took a home pain medication, using a knee brace, and his home remedy for her knee which helps. Her primary concern is for possible DVT. She denies fever, chills, chest pain, shortness of breath, productive cough, hemoptysis, low back pain, numbness weakness or rash.  Past Medical History  Diagnosis Date  . 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 Seizures     due to head trauma as adult  . Cervical cancer     2013, untreated  . Asthma     as child  . Meningioma   . Gallstones     s/p cholecystectomy  . HTN (hypertension)   . GERD (gastroesophageal reflux disease)   . Allergic rhinitis   . Cigarette nicotine dependence with withdrawal   . DVT (deep venous thrombosis)   . Chronic back pain   . Sciatic pain   . Arthritis    Past Surgical History  Procedure Laterality Date  . Brain surgery      2013 to remove a meningioma  . Cholecystectomy     Family History  Problem Relation Age of Onset  . Other Mother     varicose vein  . Asthma Mother   . High blood  pressure Mother   . Deep vein thrombosis Neg Hx   . Pulmonary embolism Neg Hx   . Cancer    . Diabetes    . High blood pressure    . Asthma    . Thyroid disease    . Cancer Father    History  Substance Use Topics  . Smoking status: Current Every Day Smoker -- 0.25 packs/day for 16 years    Types: Cigarettes  . Smokeless tobacco: Never Used  . Alcohol Use: No     Comment: Smokes 3 joints a day   OB History   Grav Para Term Preterm Abortions TAB SAB Ect Mult Living                 Review of Systems  All other systems reviewed and are negative.     Allergies  Ibuprofen; Clindamycin/lincomycin; Coconut flavor; Latex; Other; and Aspirin  Home Medications   Prior to Admission medications   Medication Sig Start Date End Date Taking? Authorizing Provider  acetaminophen (TYLENOL) 500 MG tablet Take 1,000 mg by mouth every 6 (six) hours as needed for moderate pain or headache.    Historical Provider, MD  albuterol (PROVENTIL HFA;VENTOLIN HFA) 108 (90 BASE) MCG/ACT inhaler Inhale 2 puffs into the lungs every 4 (four)  hours as needed for wheezing or shortness of breath.     Historical Provider, MD  albuterol (PROVENTIL) (2.5 MG/3ML) 0.083% nebulizer solution Take 2.5 mg by nebulization every 6 (six) hours as needed for wheezing or shortness of breath.    Historical Provider, MD  beclomethasone (QVAR) 40 MCG/ACT inhaler Inhale 2 puffs into the lungs 2 (two) times daily.     Historical Provider, MD  benzocaine (HURRICAINE) 20 % oral spray Apply 1 application topically 3 (three) times daily as needed for pain.    Historical Provider, MD  cyclobenzaprine (FLEXERIL) 10 MG tablet Take 1 tablet (10 mg total) by mouth 2 (two) times daily as needed for muscle spasms. 02/01/13   Larence Penning, MD  diazepam (VALIUM) 10 MG tablet Take 10 mg by mouth every 8 (eight) hours as needed for anxiety.     Historical Provider, MD  divalproex (DEPAKOTE) 500 MG DR tablet Take 1 tablet (500 mg total) by mouth 3  (three) times daily. 07/09/13   Lauren Burnetta Sabin, PA-C  fexofenadine-pseudoephedrine (ALLEGRA-D 24) 180-240 MG per 24 hr tablet Take 1 tablet by mouth daily.    Historical Provider, MD  fluticasone (FLONASE) 50 MCG/ACT nasal spray Place 1 spray into the nose 2 (two) times daily.    Historical Provider, MD  folic acid (FOLVITE) 1 MG tablet Take 1 mg by mouth daily.    Historical Provider, MD  gabapentin (NEURONTIN) 400 MG capsule Take 2 capsules (800 mg total) by mouth 2 (two) times daily. 12/10/12   Kathrynn Ducking, MD  hydrochlorothiazide (HYDRODIURIL) 25 MG tablet Take 1 tablet (25 mg total) by mouth daily. 07/09/13   Lauren Burnetta Sabin, PA-C  levETIRAcetam (KEPPRA) 1000 MG tablet Take 1 tablet (1,000 mg total) by mouth 2 (two) times daily. 07/09/13   Lauren Burnetta Sabin, PA-C  loratadine (CLARITIN) 10 MG tablet Take 10 mg by mouth daily.    Historical Provider, MD  mirtazapine (REMERON) 15 MG tablet Take 15 mg by mouth at bedtime.    Historical Provider, MD  omeprazole (PRILOSEC) 20 MG capsule Take 20 mg by mouth 2 (two) times daily.    Historical Provider, MD  Oxycodone HCl 10 MG TABS Take 10 mg by mouth 3 (three) times daily.    Historical Provider, MD  PARoxetine (PAXIL) 40 MG tablet Take 1 tablet (40 mg total) by mouth daily. 07/09/13   Lauren Burnetta Sabin, PA-C  promethazine (PHENERGAN) 6.25 MG/5ML syrup Take 12.5 mg by mouth every 6 (six) hours as needed for nausea or vomiting.    Historical Provider, MD  propranolol (INDERAL) 20 MG tablet Take 1 tablet (20 mg total) by mouth 2 (two) times daily. 07/09/13   Lauren Burnetta Sabin, PA-C  sodium chloride (OCEAN) 0.65 % nasal spray Place 1 spray into the nose 2 (two) times daily as needed for congestion.     Historical Provider, MD  triamcinolone ointment (KENALOG) 0.5 % Apply 1 application topically 2 (two) times daily.    Historical Provider, MD   BP 146/84  Pulse 102  Temp(Src) 98.5 F (36.9 C) (Oral)  Resp 18  Ht 5\' 5"  (1.651 m)  Wt 245 lb (111.131 kg)   BMI 40.77 kg/m2  SpO2 98%  LMP 07/02/2013 Physical Exam  Nursing note and vitals reviewed. Constitutional: She appears well-developed and well-nourished. No distress.  HENT:  Head: Atraumatic.  Eyes: Conjunctivae are normal.  Neck: Neck supple.  Cardiovascular: Normal rate and regular rhythm.   Pulmonary/Chest: Effort normal and breath sounds  normal.  Abdominal: Soft. There is no tenderness.  Musculoskeletal: She exhibits tenderness (Bilateral lowest images without palpable cords, erythema, edema, however patient has diffuse tenderness throughout entire left leg with no focal point tenderness. Decreased knee flexion due to pain without any joint laxity or crepitus). She exhibits no edema.  No obvious unilateral leg edema noted on exam however limited due to patient's body habitus.  Patient has intact distal pulses with normal sensation throughout lower extremities  Neurological: She is alert.  Skin: No rash noted.  Psychiatric: She has a normal mood and affect.    ED Course  Procedures (including critical care time)  1:20 PM Pt with prior hx of DVT is here with left leg pain and swelling and she worries for possible DVT.  I do not appreciate any significant signs of DVT, but given her sxs i will obtain vascular US.  Her Knee pain is unlikely to be acute bony fx/dislocation given no specific injury.  She has knee brace and pain medications as home which i recommend to continue along with RICE therapy.  She also has an orthopedist that she can f/u.    3:30 PM Vascular US demonstrates no acute abnormality, specifically no DVT.  Reassurance given.  Recommend pt to f/u with her doctor for further care.  Return precaution discussed  Author: Iantha Fallen, RVS Service: Vascular Lab Author Type: Cardiovascular Sonographer   Filed: 07/29/2013 2:00 PM Note Time: 07/29/2013 2:00 PM Status: Signed   Editor: Iantha Fallen, RVS (Cardiovascular Sonographer)      VASCULAR LAB  PRELIMINARY  PRELIMINARY PRELIMINARY PRELIMINARY  Left lower extremity venous Doppler completed.  Preliminary report: There is no DVT or SVT noted in the left lower extremity.  Iantha Fallen, RVT  07/29/2013, 2:00 PM       Labs Review Labs Reviewed - No data to display  Imaging Review No results found.   EKG Interpretation None      MDM   Final diagnoses:  Left leg pain    BP 109/69  Pulse 106  Temp(Src) 98.1 F (36.7 C) (Oral)  Resp 18  Ht 5\' 5"  (1.651 m)  Wt 245 lb (111.131 kg)  BMI 40.77 kg/m2  SpO2 100%  LMP 07/02/2013  I have reviewed nursing notes and vital signs. I personally reviewed the imaging tests through PACS system  I reviewed available ER/hospitalization records thought the EMR     Domenic Moras, [place] 07/29/13 1543

## 2013-07-29 NOTE — ED Notes (Signed)
She was diagnosed with dvt in L leg in august and started on lovenox. States shes has had problems with her L leg since. She continues to have pain and swelling, and wants to be rechecked for dvts in this leg

## 2013-08-02 ENCOUNTER — Emergency Department (HOSPITAL_COMMUNITY)
Admission: EM | Admit: 2013-08-02 | Discharge: 2013-08-02 | Disposition: A | Discharge status: Home / Self Care | Payer: Medicaid Other | Attending: Emergency Medicine | Admitting: Emergency Medicine

## 2013-08-02 ENCOUNTER — Encounter (HOSPITAL_COMMUNITY): Payer: Self-pay | Admitting: Emergency Medicine

## 2013-08-02 ENCOUNTER — Emergency Department (HOSPITAL_COMMUNITY): Payer: Medicaid Other

## 2013-08-02 DIAGNOSIS — M5430 Sciatica, unspecified side: Secondary | ICD-10-CM | POA: Insufficient documentation

## 2013-08-02 DIAGNOSIS — Z91040 Latex allergy status: Secondary | ICD-10-CM | POA: Insufficient documentation

## 2013-08-02 DIAGNOSIS — D320 Benign neoplasm of cerebral meninges: Secondary | ICD-10-CM | POA: Insufficient documentation

## 2013-08-02 DIAGNOSIS — K219 Gastro-esophageal reflux disease without esophagitis: Secondary | ICD-10-CM | POA: Insufficient documentation

## 2013-08-02 DIAGNOSIS — J309 Allergic rhinitis, unspecified: Secondary | ICD-10-CM | POA: Insufficient documentation

## 2013-08-02 DIAGNOSIS — R319 Hematuria, unspecified: Secondary | ICD-10-CM | POA: Insufficient documentation

## 2013-08-02 DIAGNOSIS — R112 Nausea with vomiting, unspecified: Secondary | ICD-10-CM | POA: Insufficient documentation

## 2013-08-02 DIAGNOSIS — F19939 Other psychoactive substance use, unspecified with withdrawal, unspecified: Secondary | ICD-10-CM | POA: Insufficient documentation

## 2013-08-02 DIAGNOSIS — Z79899 Other long term (current) drug therapy: Secondary | ICD-10-CM | POA: Insufficient documentation

## 2013-08-02 DIAGNOSIS — Z883 Allergy status to other anti-infective agents status: Secondary | ICD-10-CM | POA: Insufficient documentation

## 2013-08-02 DIAGNOSIS — G8929 Other chronic pain: Secondary | ICD-10-CM | POA: Insufficient documentation

## 2013-08-02 DIAGNOSIS — F17200 Nicotine dependence, unspecified, uncomplicated: Secondary | ICD-10-CM | POA: Insufficient documentation

## 2013-08-02 DIAGNOSIS — N898 Other specified noninflammatory disorders of vagina: Secondary | ICD-10-CM | POA: Insufficient documentation

## 2013-08-02 DIAGNOSIS — R3915 Urgency of urination: Secondary | ICD-10-CM | POA: Insufficient documentation

## 2013-08-02 DIAGNOSIS — I10 Essential (primary) hypertension: Secondary | ICD-10-CM | POA: Insufficient documentation

## 2013-08-02 DIAGNOSIS — G40309 Generalized idiopathic epilepsy and epileptic syndromes, not intractable, without status epilepticus: Secondary | ICD-10-CM | POA: Insufficient documentation

## 2013-08-02 DIAGNOSIS — Z86718 Personal history of other venous thrombosis and embolism: Secondary | ICD-10-CM | POA: Insufficient documentation

## 2013-08-02 DIAGNOSIS — M129 Arthropathy, unspecified: Secondary | ICD-10-CM | POA: Insufficient documentation

## 2013-08-02 DIAGNOSIS — Z881 Allergy status to other antibiotic agents status: Secondary | ICD-10-CM | POA: Insufficient documentation

## 2013-08-02 DIAGNOSIS — K8020 Calculus of gallbladder without cholecystitis without obstruction: Secondary | ICD-10-CM | POA: Insufficient documentation

## 2013-08-02 DIAGNOSIS — R300 Dysuria: Secondary | ICD-10-CM | POA: Insufficient documentation

## 2013-08-02 DIAGNOSIS — M549 Dorsalgia, unspecified: Secondary | ICD-10-CM | POA: Insufficient documentation

## 2013-08-02 DIAGNOSIS — R399 Unspecified symptoms and signs involving the genitourinary system: Secondary | ICD-10-CM

## 2013-08-02 DIAGNOSIS — Z888 Allergy status to other drugs, medicaments and biological substances status: Secondary | ICD-10-CM | POA: Insufficient documentation

## 2013-08-02 DIAGNOSIS — J45909 Unspecified asthma, uncomplicated: Secondary | ICD-10-CM | POA: Insufficient documentation

## 2013-08-02 DIAGNOSIS — R350 Frequency of micturition: Secondary | ICD-10-CM | POA: Insufficient documentation

## 2013-08-02 LAB — URINALYSIS, ROUTINE W REFLEX MICROSCOPIC
BILIRUBIN URINE: NEGATIVE
Glucose, UA: NEGATIVE mg/dL
Ketones, ur: NEGATIVE mg/dL
Leukocytes, UA: NEGATIVE
Nitrite: NEGATIVE
PH: 6 (ref 5.0–8.0)
Protein, ur: NEGATIVE mg/dL
SPECIFIC GRAVITY, URINE: 1.022 (ref 1.005–1.030)
Urobilinogen, UA: 0.2 mg/dL (ref 0.0–1.0)

## 2013-08-02 LAB — CBC WITH DIFFERENTIAL/PLATELET
BASOS ABS: 0 10*3/uL (ref 0.0–0.1)
Basophils Relative: 0 % (ref 0–1)
EOS ABS: 0 10*3/uL (ref 0.0–0.7)
Eosinophils Relative: 1 % (ref 0–5)
HCT: 39.9 % (ref 36.0–46.0)
Hemoglobin: 13.5 g/dL (ref 12.0–15.0)
LYMPHS ABS: 2.5 10*3/uL (ref 0.7–4.0)
LYMPHS PCT: 46 % (ref 12–46)
MCH: 31.9 pg (ref 26.0–34.0)
MCHC: 33.8 g/dL (ref 30.0–36.0)
MCV: 94.3 fL (ref 78.0–100.0)
Monocytes Absolute: 0.4 10*3/uL (ref 0.1–1.0)
Monocytes Relative: 7 % (ref 3–12)
Neutro Abs: 2.4 10*3/uL (ref 1.7–7.7)
Neutrophils Relative %: 46 % (ref 43–77)
PLATELETS: 224 10*3/uL (ref 150–400)
RBC: 4.23 MIL/uL (ref 3.87–5.11)
RDW: 13.6 % (ref 11.5–15.5)
WBC: 5.3 10*3/uL (ref 4.0–10.5)

## 2013-08-02 LAB — COMPREHENSIVE METABOLIC PANEL
ALT: 12 U/L (ref 0–35)
AST: 16 U/L (ref 0–37)
Albumin: 3.8 g/dL (ref 3.5–5.2)
Alkaline Phosphatase: 66 U/L (ref 39–117)
BUN: 8 mg/dL (ref 6–23)
CALCIUM: 9.5 mg/dL (ref 8.4–10.5)
CHLORIDE: 99 meq/L (ref 96–112)
CO2: 27 meq/L (ref 19–32)
CREATININE: 0.84 mg/dL (ref 0.50–1.10)
GFR calc non Af Amer: 89 mL/min — ABNORMAL LOW (ref >90)
GLUCOSE: 87 mg/dL (ref 70–99)
Potassium: 3.6 mEq/L — ABNORMAL LOW (ref 3.7–5.3)
Sodium: 139 mEq/L (ref 137–147)
Total Bilirubin: 0.3 mg/dL (ref 0.3–1.2)
Total Protein: 8.2 g/dL (ref 6.0–8.3)

## 2013-08-02 LAB — URINE MICROSCOPIC-ADD ON

## 2013-08-02 LAB — WET PREP, GENITAL
CLUE CELLS WET PREP: NONE SEEN
Trich, Wet Prep: NONE SEEN
Yeast Wet Prep HPF POC: NONE SEEN

## 2013-08-02 LAB — LIPASE, BLOOD: Lipase: 22 U/L (ref 11–59)

## 2013-08-02 LAB — POC URINE PREG, ED: PREG TEST UR: NEGATIVE

## 2013-08-02 MED ORDER — ONDANSETRON 4 MG PO TBDP
4.0000 mg | ORAL_TABLET | Freq: Once | ORAL | Status: AC
  Administered 2013-08-02: 4 mg via ORAL
  Filled 2013-08-02: qty 1

## 2013-08-02 MED ORDER — PHENAZOPYRIDINE HCL 200 MG PO TABS: 200.0000 mg | ORAL_TABLET | Freq: Three times a day (TID) | ORAL | Status: DC

## 2013-08-02 MED ORDER — CEPHALEXIN 500 MG PO CAPS: 500.0000 mg | ORAL_CAPSULE | Freq: Two times a day (BID) | ORAL | Status: DC

## 2013-08-02 MED ORDER — SODIUM CHLORIDE 0.9 % IV BOLUS (SEPSIS)
1000.0000 mL | Freq: Once | INTRAVENOUS | Status: AC
  Administered 2013-08-02: 1000 mL via INTRAVENOUS

## 2013-08-02 MED ORDER — MORPHINE SULFATE 4 MG/ML IJ SOLN
4.0000 mg | Freq: Once | INTRAMUSCULAR | Status: AC
  Administered 2013-08-02: 4 mg via INTRAVENOUS
  Filled 2013-08-02: qty 1

## 2013-08-02 MED ORDER — ONDANSETRON 4 MG PO TBDP: 4.0000 mg | ORAL_TABLET | Freq: Three times a day (TID) | ORAL | Status: DC | PRN

## 2013-08-02 NOTE — ED Notes (Signed)
Attempted to obtain IV x2 RN's. Called IV team

## 2013-08-02 NOTE — ED Notes (Addendum)
Gave juice. Pt tolerated well and is no longer nauseous

## 2013-08-02 NOTE — ED Notes (Signed)
Pt states she started having lower abd pain that started approx 3 days ago. Pain shoots down her legs. Pt states she is also having vaginal bleeding, but says it is coming out of her clitoris. Pt also states that her bladder has "dropped" and she needs to have surgery to have it lifted. Pt has been vomiting today and feels light headed.

## 2013-08-02 NOTE — Discharge Instructions (Signed)
Take medications as directed. [facility] for reevaluation. Return to emergency department if you develop any worsening symptoms, abdominal pain, intractable vomiting or vaginal bleeding.    Abdominal Pain, Adult Many things can cause belly (abdominal) pain. Most times, the belly pain is not dangerous. Many cases of belly pain can be watched and treated at home. HOME CARE   Do not take medicines that help you go poop (laxatives) unless told to by your doctor.  Only take medicine as told by your doctor.  Eat or drink as told by your doctor. Your doctor will tell you if you should be on a special diet. GET HELP IF:  You do not know what is causing your belly pain.  You have belly pain while you are sick to your stomach (nauseous) or have runny poop (diarrhea).  You have pain while you pee or poop.  Your belly pain wakes you up at night.  You have belly pain that gets worse or better when you eat.  You have belly pain that gets worse when you eat fatty foods. GET HELP RIGHT AWAY IF:   The pain does not go away within 2 hours.  You have a fever.  You keep throwing up (vomiting).  The pain changes and is only in the right or left part of the belly.  You have bloody or tarry looking poop. MAKE SURE YOU:   Understand these instructions.  Will watch your condition.  Will get help right away if you are not doing well or get worse. Document Released: 09/18/2007 Document Revised: 01/20/2013 Document Reviewed: 12/09/2012 [facility] Patient Information 2014 [place].

## 2013-08-02 NOTE — ED Provider Notes (Signed)
CSN: [id number]     Arrival date & time 08/02/13  5956 History   First MD Initiated Contact with Patient 08/02/13 [phone]     Chief Complaint  Patient presents with  . Abdominal Pain  . Vaginal Bleeding     (Consider location/radiation/quality/duration/timing/severity/associated sxs/prior Treatment) HPI 38 yo female presents with lower abdominal pain that started approximately 3 days ago. Pain described as sharp, stinging feeling, that is constant and worsening.Pain rated currently at 10/10. Patient states she had difficulty walking due to pain. Patient denies any similar episodes in the past. Patient also admits to N/V x 3 this morning immediately after breakfast this morning (pancakes, turkery bacon, and fried sweet potatoes). Patient describes similar episodes yesterday as well and appears to be associated with eating. Denies any constipation, diarrhea, bloody stools. Patient admits to frequency of urination, urgency, dysuria, and hematuria. Denies vaginal discharge, pain, or dyspareunia. Patient reports taking 2 pregnancy tests, one positive and one negative. PMH significant for GERD, DVT with IVC filter placement, Asthma, Cholecystectomy. Patient is current smoker. Denies any alcohol use.  Past Medical History  Diagnosis Date  . 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 Seizures     due to head trauma as adult  . Asthma     as child  . Meningioma   . Gallstones     s/p cholecystectomy  . HTN (hypertension)   . GERD (gastroesophageal reflux disease)   . Allergic rhinitis   . Cigarette nicotine dependence with withdrawal   . DVT (deep venous thrombosis)   . Chronic back pain   . Sciatic pain   . Arthritis    Past Surgical History  Procedure Laterality Date  . Brain surgery      2013 to remove a meningioma  . Cholecystectomy     Family History  Problem Relation Age of Onset  . Other Mother     varicose vein  . Asthma Mother   . High blood pressure Mother   . Deep vein  thrombosis Neg Hx   . Pulmonary embolism Neg Hx   . Cancer    . Diabetes    . High blood pressure    . Asthma    . Thyroid disease    . Cancer Father    History  Substance Use Topics  . Smoking status: Current Every Day Smoker -- 0.25 packs/day for 16 years    Types: Cigarettes  . Smokeless tobacco: Never Used  . Alcohol Use: No     Comment: Smokes 3 joints a day   OB History   Grav Para Term Preterm Abortions TAB SAB Ect Mult Living                 Review of Systems  All other systems reviewed and are negative.     Allergies  Ibuprofen; Clindamycin/lincomycin; Coconut flavor; Flagyl; Latex; Other; and Aspirin  Home Medications   Prior to Admission medications   Medication Sig Start Date End Date Taking? Authorizing Provider  acetaminophen (TYLENOL) 500 MG tablet Take 1,000 mg by mouth every 6 (six) hours as needed for moderate pain or headache.    Historical Provider, MD  albuterol (PROVENTIL HFA;VENTOLIN HFA) 108 (90 BASE) MCG/ACT inhaler Inhale 2 puffs into the lungs every 4 (four) hours as needed for wheezing or shortness of breath.     Historical Provider, MD  albuterol (PROVENTIL) (2.5 MG/3ML) 0.083% nebulizer solution Take 2.5 mg by nebulization every 6 (six) hours as  needed for wheezing or shortness of breath.    Historical Provider, MD  beclomethasone (QVAR) 40 MCG/ACT inhaler Inhale 2 puffs into the lungs 2 (two) times daily.     Historical Provider, MD  benzocaine (HURRICAINE) 20 % oral spray Apply 1 application topically 3 (three) times daily as needed for pain.    Historical Provider, MD  cyclobenzaprine (FLEXERIL) 10 MG tablet Take 1 tablet (10 mg total) by mouth 2 (two) times daily as needed for muscle spasms. 02/01/13   Larence Penning, MD  diazepam (VALIUM) 10 MG tablet Take 10 mg by mouth every 8 (eight) hours as needed for anxiety.     Historical Provider, MD  divalproex (DEPAKOTE) 500 MG DR tablet Take 1 tablet (500 mg total) by mouth 3 (three) times daily.  07/09/13   Lauren Burnetta Sabin, PA-C  fexofenadine-pseudoephedrine (ALLEGRA-D 24) 180-240 MG per 24 hr tablet Take 1 tablet by mouth daily.    Historical Provider, MD  fluticasone (FLONASE) 50 MCG/ACT nasal spray Place 1 spray into the nose 2 (two) times daily.    Historical Provider, MD  folic acid (FOLVITE) 1 MG tablet Take 1 mg by mouth daily.    Historical Provider, MD  gabapentin (NEURONTIN) 400 MG capsule Take 2 capsules (800 mg total) by mouth 2 (two) times daily. 12/10/12   Kathrynn Ducking, MD  hydrochlorothiazide (HYDRODIURIL) 25 MG tablet Take 1 tablet (25 mg total) by mouth daily. 07/09/13   Lauren Burnetta Sabin, PA-C  levETIRAcetam (KEPPRA) 1000 MG tablet Take 1 tablet (1,000 mg total) by mouth 2 (two) times daily. 07/09/13   Lauren Burnetta Sabin, PA-C  loratadine (CLARITIN) 10 MG tablet Take 10 mg by mouth daily.    Historical Provider, MD  mirtazapine (REMERON) 15 MG tablet Take 15 mg by mouth at bedtime.    Historical Provider, MD  omeprazole (PRILOSEC) 20 MG capsule Take 20 mg by mouth 2 (two) times daily.    Historical Provider, MD  Oxycodone HCl 10 MG TABS Take 10 mg by mouth 3 (three) times daily.    Historical Provider, MD  PARoxetine (PAXIL) 40 MG tablet Take 1 tablet (40 mg total) by mouth daily. 07/09/13   Lauren Burnetta Sabin, PA-C  promethazine (PHENERGAN) 6.25 MG/5ML syrup Take 12.5 mg by mouth every 6 (six) hours as needed for nausea or vomiting.    Historical Provider, MD  propranolol (INDERAL) 20 MG tablet Take 1 tablet (20 mg total) by mouth 2 (two) times daily. 07/09/13   Lauren Burnetta Sabin, PA-C  sodium chloride (OCEAN) 0.65 % nasal spray Place 1 spray into the nose 2 (two) times daily as needed for congestion.     Historical Provider, MD  triamcinolone ointment (KENALOG) 0.5 % Apply 1 application topically 2 (two) times daily.    Historical Provider, MD   BP 114/73  Pulse 78  Temp(Src) 98 F (36.7 C) (Oral)  Resp 16  Ht 5\' 5"  (1.651 m)  Wt 247 lb (112.038 kg)  BMI 41.10 kg/m2  SpO2  100%  LMP 07/20/2013 Physical Exam  Nursing note and vitals reviewed. Constitutional: She is oriented to person, place, and time. She appears well-developed and well-nourished. No distress.  HENT:  Head: Normocephalic and atraumatic.  Eyes: Conjunctivae are normal. No scleral icterus.  Neck: No JVD present. No tracheal deviation present.  Cardiovascular: Normal rate and regular rhythm.  Exam reveals no gallop and no friction rub.   No murmur heard. Pulmonary/Chest: Effort normal and breath sounds normal. No respiratory distress.  She has no wheezes. She has no rhonchi. She has no rales.  Abdominal: Soft. She exhibits no distension. Bowel sounds are decreased. There is no hepatosplenomegaly. There is generalized tenderness. There is no rigidity, no rebound, no guarding, no CVA tenderness, no tenderness at McBurney's point and negative Murphy's sign.  Genitourinary: Pelvic exam was performed with patient supine. There is no rash, tenderness, lesion or injury on the right labia. There is no rash, tenderness, lesion or injury on the left labia. Cervix exhibits discharge (thin white). Cervix exhibits no motion tenderness and no friability. Right adnexum displays no mass and no tenderness. Left adnexum displays no mass and no tenderness. No erythema, tenderness or bleeding around the vagina. No foreign body around the vagina. No signs of injury around the vagina. No vaginal discharge found.  Musculoskeletal: She exhibits no edema.  Neurological: She is alert and oriented to person, place, and time.  Skin: Skin is warm and dry. She is not diaphoretic.  Psychiatric: She has a normal mood and affect. Her behavior is normal.    ED Course  Procedures (including critical care time) Labs Review Labs Reviewed  WET PREP, GENITAL - Abnormal; Notable for the following:    WBC, Wet Prep HPF POC FEW (*)    All other components within normal limits  URINALYSIS, ROUTINE W REFLEX MICROSCOPIC - Abnormal; Notable  for the following:    Hgb urine dipstick TRACE (*)    All other components within normal limits  COMPREHENSIVE METABOLIC PANEL - Abnormal; Notable for the following:    Potassium 3.6 (*)    GFR calc non Af Amer 89 (*)    All other components within normal limits  GC/CHLAMYDIA PROBE AMP  URINE MICROSCOPIC-ADD ON  LIPASE, BLOOD  CBC WITH DIFFERENTIAL  POC URINE PREG, ED    Imaging Review Ct Abdomen Pelvis Wo Contrast  08/02/2013   CLINICAL DATA:  Lower abdominal pain 3 days ago. Pain radiating down the legs. Vaginal bleeding.  EXAM: CT ABDOMEN AND PELVIS WITHOUT CONTRAST  TECHNIQUE: Multidetector CT imaging of the abdomen and pelvis was performed following the standard protocol without intravenous contrast.  COMPARISON:  CT ABD/PELVIS W CM dated 03/16/2013; CT ABD/PELVIS W CM dated 01/09/2013  FINDINGS: Bones: Bilateral SI joint degenerative disease is present. No destructive or aggressive osseous lesions.  Lung Bases: Respiratory motion artifact present at the lung bases. Basilar atelectasis.  Liver: Unenhanced CT was performed per clinician order. Lack of IV contrast limits sensitivity and specificity, especially for evaluation of abdominal/pelvic solid viscera. Grossly normal. Previously seen postcholecystectomy intrahepatic biliary ductal dilation suboptimally visualized without contrast.  Spleen:  Normal.  Gallbladder:  Cholecystectomy.  Common bile duct:  Grossly normal.  No calcified stones identified.  Pancreas:  Normal.  Adrenal glands:  Normal bilaterally.  Kidneys: Normal. No stones. Both ureters appear within normal limits.  Stomach:  Grossly normal.  Small bowel:  Normal.  Colon: High density material in the appendix may represent retained ingested oral contrast from prior CT scans or small appendicolith. Normal appendiceal size. No inflammatory changes. The colon appears within normal limits.  Pelvic Genitourinary: Uterus and adnexa appear normal. Urinary bladder appears normal.   Vasculature: IVC filter. Some of the legs protrude beyond the wall of the inferior vena cava, commonly seen following placement.  Body Wall: Periumbilical scarring, likely postsurgical.  IMPRESSION: 1. No acute abnormality. 2. Cholecystectomy and IVC filter unchanged.   Electronically Signed   By: Dereck Ligas M.D.   On: 08/02/2013 09:57  EKG Interpretation None      MDM   Final diagnoses:  UTI symptoms    Patient with generalized abdominal pain worse in lower abdomen. Patietn afebrile with normal VS.  Urine preg negative. Doubt ectopic.  Pelvic exam benign, doubt TOA, or PID.  Wet prep negative for yeast, trich, and clue cells.  CMP wnl  Lipase negative. Doubt pancreatitis. CBC wnl  UA shows trace hgb similar to past. No evidence of UTI. Patient symptomatic and has focal tenderness over bladder. Will treat patient for UTI and have her follow up in 2 days with [facility] outpatient [facility]. Patient states she has appt with a surgeon for her "dropped bladder"  Discussed results with patient. Patient agrees with plan.   Meds given in ED:  Medications  ondansetron (ZOFRAN-ODT) disintegrating tablet 4 mg (4 mg Oral Given 08/02/13 0817)  morphine 4 MG/ML injection 4 mg (4 mg Intravenous Given 08/02/13 0957)  sodium chloride 0.9 % bolus 1,000 mL (0 mLs Intravenous Stopped 08/02/13 1037)  morphine 4 MG/ML injection 4 mg (4 mg Intravenous Given 08/02/13 1038)  ondansetron (ZOFRAN-ODT) disintegrating tablet 4 mg (4 mg Oral Given 08/02/13 1037)    Discharge Medication List as of 08/02/2013 11:19 AM       Sherrie George, PA-C 08/03/13 1555

## 2013-08-02 NOTE — ED Provider Notes (Signed)
Medical screening examination/treatment/procedure(s) were conducted as a shared visit with non-physician practitioner(s) and myself.  I personally evaluated the patient during the encounter.   EKG Interpretation None     Patient here complaining of dysuria and hematuria x2 days. Slight flank pain. Has been persistent. Suspect cystitis. Awaiting the results of labs. Patient's abdominal exam is nonsurgical  Leota Jacobsen, MD 08/02/13 [phone]

## 2013-08-03 LAB — GC/CHLAMYDIA PROBE AMP
CT PROBE, AMP APTIMA: NEGATIVE
GC PROBE AMP APTIMA: NEGATIVE

## 2013-08-06 NOTE — ED Provider Notes (Signed)
Medical screening examination/treatment/procedure(s) were performed by non-physician practitioner and as supervising physician I was immediately available for consultation/collaboration.   EKG Interpretation None       Leota Jacobsen, MD 08/06/13 1419

## 2013-08-10 ENCOUNTER — Ambulatory Visit (INDEPENDENT_AMBULATORY_CARE_PROVIDER_SITE_OTHER): Payer: Medicaid Other | Admitting: Obstetrics & Gynecology

## 2013-08-10 ENCOUNTER — Encounter: Payer: Self-pay | Admitting: Obstetrics & Gynecology

## 2013-08-10 ENCOUNTER — Other Ambulatory Visit (HOSPITAL_COMMUNITY)
Admission: RE | Admit: 2013-08-10 | Discharge: 2013-08-10 | Disposition: A | Discharge status: Home / Self Care | Payer: Medicaid Other | Source: Ambulatory Visit | Attending: Obstetrics & Gynecology | Admitting: Obstetrics & Gynecology

## 2013-08-10 VITALS — BP 124/99 | HR 91 | Ht 65.0 in | Wt 250.0 lb

## 2013-08-10 DIAGNOSIS — R32 Unspecified urinary incontinence: Secondary | ICD-10-CM

## 2013-08-10 DIAGNOSIS — Z1151 Encounter for screening for human papillomavirus (HPV): Secondary | ICD-10-CM | POA: Insufficient documentation

## 2013-08-10 DIAGNOSIS — Z01419 Encounter for gynecological examination (general) (routine) without abnormal findings: Secondary | ICD-10-CM | POA: Insufficient documentation
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
## 2013-08-10 NOTE — Patient Instructions (Signed)
Urinary Incontinence Urinary incontinence is the involuntary loss of urine from your bladder. CAUSES  There are many causes of urinary incontinence. They include:  Medicines.  Infections.  Prostatic enlargement, leading to overflow of urine from your bladder.  Surgery.  Neurological diseases.  Emotional factors. SIGNS AND SYMPTOMS Urinary Incontinence can be divided into four types: 1. Urge incontinence. Urge incontinence is the involuntary loss of urine before you have the opportunity to go to the bathroom. There is a sudden urge to void but not enough time to reach a bathroom. 2. Stress incontinence. Stress incontinence is the sudden loss of urine with any activity that forces urine to pass. It is commonly caused by anatomical changes to the pelvis and sphincter areas of your body. 3. Overflow incontinence. Overflow incontinence is the loss of urine from an obstructed opening to your bladder. This results in a backup of urine and a resultant buildup of pressure within the bladder. When the pressure within the bladder exceeds the closing pressure of the sphincter, the urine overflows, which causes incontinence, similar to water overflowing a dam. 4. Total incontinence. Total incontinence is the loss of urine as a result of the inability to store urine within your bladder. DIAGNOSIS  Evaluating the cause of incontinence may require:  A thorough and complete medical and obstetric history.  A complete physical exam.  Laboratory tests such as a urine culture and sensitivities. When additional tests are indicated, they can include:  An ultrasound exam.  Kidney and bladder X-rays.  Cystoscopy. This is an exam of the bladder using a narrow scope.  Urodynamic testing to test the nerve function to the bladder and sphincter areas. TREATMENT  Treatment for urinary incontinence depends on the cause:  For urge incontinence caused by a bacterial infection, antibiotics will be prescribed.  If the urge incontinence is related to medicines you take, your health care provider may have you change the medicine.  For stress incontinence, surgery to re-establish anatomical support to the bladder or sphincter, or both, will often correct the condition.  For overflow incontinence caused by an enlarged prostate, an operation to open the channel through the enlarged prostate will allow the flow of urine out of the bladder. In women with fibroids, a hysterectomy may be recommended.  For total incontinence, surgery on your urinary sphincter may help. An artificial urinary sphincter (an inflatable cuff placed around the urethra) may be required. In women who have developed a hole-like passage between their bladder and vagina (vesicovaginal fistula), surgery to close the fistula often is required. HOME CARE INSTRUCTIONS  Normal daily hygiene and the use of pads or adult diapers that are changed regularly will help prevent odors and skin damage.  Avoid caffeine. It can overstimulate your bladder.  Use the bathroom regularly. Try about every 2 3 hours to go to the bathroom, even if you do not feel the need to do so. Take time to empty your bladder completely. After urinating, wait a minute. Then try to urinate again.  For causes involving nerve dysfunction, keep a log of the medicines you take and a journal of the times you go to the bathroom. SEEK MEDICAL CARE IF:  You experience worsening of pain instead of improvement in pain after your procedure.  Your incontinence becomes worse instead of better. SEE IMMEDIATE MEDICAL CARE IF:  You experience fever or shaking chills.  You are unable to pass your urine.  You have redness spreading into your groin or down into your thighs. MAKE   SURE YOU:   Understand these instructions.   Will watch your condition.  Will get help right away if you are not doing well or get worse. Document Released: 05/09/2004 Document Revised: 01/20/2013 Document  Reviewed: 09/08/2012 [facility] Patient Information 2014 [place]. Neurogenic Bladder Neurogenic bladder is a loss of normal control of bladder function. This is caused by damaged nerves, which can be a result of a variety of injuries and diseases.  The muscles and nerves of the urinary system work together to store urine and release urine at the right time. Nerves carry messages from the bladder to the brain and from the brain to the muscles of the bladder. In a neurogenic bladder, the nerves that are supposed to carry these messages do not work properly.There are 2 types of neurogenic bladder:  Overactive.  The bladder is unable to control when or how much to urinate. Even when only a small amount of urine is in the bladder, there may be an urge to urinate that cannot be controlled. This can result in wetting accidents (urge incontinence).  Underactive. The bladder holds much more urine than normal. Small amounts of urine leak out as bladder pressure builds because there is not a sensation that the bladder is full. This can also result in wetting accidents. CAUSES  Neurogenic bladder can be caused by many problems. These include:   Stroke.  Multiple sclerosis.  Infection.  Trauma and injuries to the spine, spinal cord, depending on the level in the back of the injury.  Diabetes.  Parkinson's Disease.  Brain and spinal cord tumors.  Birth defects that affect the brain or spinal cord.  Guillain-Barr syndrome.  Complications of surgery involving the spine, spinal cord, or pelvis. SYMPTOMS  The following are the most common problems and symptoms of neurogenic bladder. However, each individual may experience symptoms differently.   Urinary tract infection symptoms:  Pain or burning when urinating.  Back or flank pain.  Cloudy or bloody urine.  Fever.  Kidney stone symptoms. Symptoms of kidney stones include:  Chills.  Shivering.  Feeling sick to your stomach  (nausea) and/or vomiting.  Fever.  Loss of bladder control (urinary incontinence).  Small urine volume when emptying bladder (voiding).  Urinary frequency and urgency.  Dribbling urine.  Loss of sensation of bladder fullness.  Kidney failure.  Infection in the blood stream (sepsis). DIAGNOSIS  When neurogenic bladder is suspected, both the nervous system and the bladder are examined. In addition to reviewing your complete medical history and having a physical exam, diagnostic procedures for neurogenic bladder may include:  X-rays of the spine.  Imaging tests of the kidneys, ureters, and bladder. These tests may include:  MRI.  CT scan.  Ultrasound.  Urodynamics or Cystometrogram (CMG). Thistests the nerves and muscles of the bladder. The test can show how much the bladder can hold and if it empties completely.  EMG of the sphincter . This is measure of the electrical activity of the sphincter muscle and this helps determine when the sphincter is contracting or squeezing down.  Video studies of the bladder and sphincter while you are urinating.  Cystoscopy . Looking inside the bladder with a telescope like instrument. TREATMENT  Specific treatment for neurogenic bladder will be determined by your caregiver based on:  Your age, overall health, and medical history.  Severity of symptoms.  Cause of the nerve damage.  Type of bladder problem present.  Your tolerance for specific medications, procedures, or therapies.  Expectations for the  course of the condition.  Your opinion or preference. Treatment may include:  Antibiotic medicine.  Medications.  Insertion of a flexible tube (catheter) to empty the bladder.  Surgery to create an artificial sphincterto prevent urinary leakage.  Sacral nerve stimulation (SNS) to help stimulate the nerves in order to empty the bladder.  Sling surgery to hold the neck of the bladder and urethra in the proper position to  prevent leakage.  Bladder augmentation.  Ileal loop surgery to connect a section of intestine to the ureters and positioned out to a opening on the abdomen. Urine comes out and can be collected into a plastic bag.  Perineal pads may be used to catch urine. HOME CARE INSTRUCTIONS   Take all medications as prescribed by your caregiver.  Review your medications (both prescription and non-prescription) with your caregiver to make sure medications you are presently taking will not be harmful.  Periodic blood, urine, and imaging tests may be required. Follow your caregiver's advice regarding the timing of these.  Follow the catheter care instructions if you use a catheter.  Use disposiable underwear if you have bladder weakness. SEEK MEDICAL CARE IF:   You have increasing fatigue or weakness.  You find there is less and less control of the urine stream despite treatment.  You develop a loss of appetite or nausea.  You begin to have trouble inserting the catheter.  Your urine appears somewhat dark, cloudy, or has an unusual smell. SEEK IMMEDIATE MEDICAL CARE IF:   You develop worsening abdominal or back pain.  You cannot pass any urine.  Your urine becomes bloody.  You experience a lot of burning while urinating.  You have a fever.  You keep vomiting.  You have bleeding with catheter insertion. Document Released: 10/13/2006 Document Revised: 03/18/2012 Document Reviewed: 01/27/2009 [facility] Patient Information 2014 [place]. Incontinencia urinaria ( Urinary Incontinence) La incontinencia urinaria es la prdida involuntaria de orina de la vejiga. CAUSAS  Hay muchas causas de incontinencia urinaria. Ellas son:  Medicamentos.  Infecciones.  Agrandamiento prosttico que causa un aumento del flujo de [place] de la vejiga.  Ciruga.  Enfermedades neurolgicas.  Factores emocionales. SIGNOS Y SNTOMAS Hay cuatro tipos de incontinencia urinaria: 1. Incontinencia  urinaria de urgencia: es la prdida involuntaria de orina antes de llegar al bao. Hay una urgencia repentina de orinar pero no llega a tiempo al bao. 2. Incontinencia por estrs: es la prdida repentina de orina con cualquier actividad que fuerza a Art gallery manager orina. La causa ms frecuente son los cambios anatmicos en la pelvis y las zonas del esfnter. 3. Incontinencia por rebosamiento: es la prdida de orina por una obstruccin de la apertura de la vejiga. Esto causa un retroceso de la orina y una acumulacin de presin dentro de la vejiga. Cuando la presin dentro de la vejiga excede la presin que Engineer, manufacturing systems, la orina rebalsa y causa incontinencia, similar al desbordamiento de un dique. 4. Incontinencia total: es la prdida de orina como resultado de la incapacidad de Financial controller orina dentro de la vejiga. DIAGNSTICO  La evaluacin de la causa puede requerir:  Ardelia Mems historia mdica y obsttrica exhaustiva y [place].  Examen fsico completo.  Anlisis de laboratorio como un urocultivo y [place]. Cuando se indican estudios adicionales, pueden incluirse:  Una ecografa.  Radiografas de vejiga y riones.  Una cistoscopa. Consiste en un examen de la vejiga utilizando un telescopio pequeo.  Estudios urodinmicos para comprobar la funcin nerviosa de la vejiga y Social worker. [place]  tratamiento de la incontinencia urinaria depende de la causa:  Para la incontinencia urinaria causada por una infeccin urinaria, le indicarn antibiticos. Si la incontinencia urinaria se relaciona con los [employer] toma, el mdico podr Public relations account executive.  Para la incontinencia por estrs, en necesaria una ciruga para restablecer el soporte anatmico de la vejiga o el esfnter, o ambos, lo que Clinical research associate.  Para la incontinencia por rebosamiento causada por una prstata agrandada, una operacin para abrir el canal a travs de la prstata agrandada permitir que el  flujo de orina salga de la vejiga. En las mujeres con fibromas, ser necesaria una histerectoma.  Para la incontinencia total, una ciruga del esfnter podr ayudar. Puede ser necesario colocar un esfnter urinario artificial (se coloca un manguito inflable alrededor de Geologist, engineering). En las mujeres que hayan desarrollado un pasaje similar a un orificio entre la vejiga y la vagina (fstula vesicovaginal ), ser necesario realizar una ciruga para cerrar la fstula. INSTRUCCIONES PARA EL CUIDADO EN EL HOGAR  La higiene diaria normal y el uso regular de apsitos o paales para adultos cambiados con regularidad ayudan a prevenir olores y daos en la piel.  Evite la cafena. Puede sobreestimular la vejiga.  Use el bao con regularidad. Trate de ir al bao cada 2  3 horas, aunque no sienta la necesidad. Tmese el tiempo para vaciar la vejiga completamente. Despus de orinar, espere un minuto. Luego trate de orinar nuevamente.  Para las causas que implican una disfuncin nerviosa, lleve un registro de los medicamentos que toma y un diario de las veces que va al bao. SOLICITE ATENCIN MDICA SI:  Despus del procedimiento, experimenta un empeoramiento del dolor en vez de mejorar.  La incontinencia empeora en vez de mejorar. SOLICITE ATENCIN MDICA DE INMEDIATO SI:  Le sube la fiebre o tiene escalofros.  No puede orinar.  Observa irritacin en la ingle o baja hacia los muslos. ASEGRESE DE QUE:   Comprende estas instrucciones.   Controlar su afeccin.  Recibir ayuda de inmediato si no mejora o si empeora. Document Released: 04/01/2005 Document Revised: 12/02/2012 [facility] Patient Information 2014 [place], [place].   Urodynamic testing

## 2013-08-17 ENCOUNTER — Other Ambulatory Visit: Payer: Self-pay | Admitting: Neurology

## 2013-10-08 ENCOUNTER — Ambulatory Visit (HOSPITAL_COMMUNITY)
Admission: RE | Admit: 2013-10-08 | Discharge: 2013-10-08 | Disposition: A | Discharge status: Home / Self Care | Payer: Medicaid Other | Source: Ambulatory Visit | Attending: Specialist | Admitting: Specialist

## 2013-10-08 ENCOUNTER — Other Ambulatory Visit (HOSPITAL_COMMUNITY): Payer: Self-pay | Admitting: Specialist

## 2013-10-08 DIAGNOSIS — R52 Pain, unspecified: Secondary | ICD-10-CM

## 2013-10-08 DIAGNOSIS — M79609 Pain in unspecified limb: Secondary | ICD-10-CM | POA: Diagnosis not present

## 2013-11-07 ENCOUNTER — Emergency Department (HOSPITAL_COMMUNITY)
Admission: EM | Admit: 2013-11-07 | Discharge: 2013-11-08 | Disposition: A | Discharge status: Home / Self Care | Payer: Medicaid Other | Attending: Emergency Medicine | Admitting: Emergency Medicine

## 2013-11-07 ENCOUNTER — Emergency Department (HOSPITAL_COMMUNITY): Payer: Medicaid Other

## 2013-11-07 ENCOUNTER — Encounter (HOSPITAL_COMMUNITY): Payer: Self-pay | Admitting: Emergency Medicine

## 2013-11-07 DIAGNOSIS — K219 Gastro-esophageal reflux disease without esophagitis: Secondary | ICD-10-CM | POA: Diagnosis not present

## 2013-11-07 DIAGNOSIS — G40909 Epilepsy, unspecified, not intractable, without status epilepticus: Secondary | ICD-10-CM | POA: Insufficient documentation

## 2013-11-07 DIAGNOSIS — F17200 Nicotine dependence, unspecified, uncomplicated: Secondary | ICD-10-CM | POA: Diagnosis not present

## 2013-11-07 DIAGNOSIS — G8929 Other chronic pain: Secondary | ICD-10-CM | POA: Diagnosis not present

## 2013-11-07 DIAGNOSIS — Z91040 Latex allergy status: Secondary | ICD-10-CM | POA: Diagnosis not present

## 2013-11-07 DIAGNOSIS — Z86718 Personal history of other venous thrombosis and embolism: Secondary | ICD-10-CM | POA: Insufficient documentation

## 2013-11-07 DIAGNOSIS — Z79899 Other long term (current) drug therapy: Secondary | ICD-10-CM | POA: Diagnosis not present

## 2013-11-07 DIAGNOSIS — J45909 Unspecified asthma, uncomplicated: Secondary | ICD-10-CM | POA: Diagnosis not present

## 2013-11-07 DIAGNOSIS — Z86011 Personal history of benign neoplasm of the brain: Secondary | ICD-10-CM | POA: Insufficient documentation

## 2013-11-07 DIAGNOSIS — IMO0002 Reserved for concepts with insufficient information to code with codable children: Secondary | ICD-10-CM | POA: Diagnosis not present

## 2013-11-07 DIAGNOSIS — R569 Unspecified convulsions: Secondary | ICD-10-CM | POA: Insufficient documentation

## 2013-11-07 DIAGNOSIS — I10 Essential (primary) hypertension: Secondary | ICD-10-CM | POA: Insufficient documentation

## 2013-11-07 DIAGNOSIS — Z8739 Personal history of other diseases of the musculoskeletal system and connective tissue: Secondary | ICD-10-CM | POA: Insufficient documentation

## 2013-11-07 LAB — BASIC METABOLIC PANEL
ANION GAP: 13 (ref 5–15)
BUN: 15 mg/dL (ref 6–23)
CHLORIDE: 104 meq/L (ref 96–112)
CO2: 22 meq/L (ref 19–32)
Calcium: 9.4 mg/dL (ref 8.4–10.5)
Creatinine, Ser: 0.85 mg/dL (ref 0.50–1.10)
GFR calc non Af Amer: 88 mL/min — ABNORMAL LOW (ref >90)
Glucose, Bld: 101 mg/dL — ABNORMAL HIGH (ref 70–99)
Potassium: 3.7 mEq/L (ref 3.7–5.3)
Sodium: 139 mEq/L (ref 137–147)

## 2013-11-07 LAB — CBC
HEMATOCRIT: 36.9 % (ref 36.0–46.0)
Hemoglobin: 12 g/dL (ref 12.0–15.0)
MCH: 30.4 pg (ref 26.0–34.0)
MCHC: 32.5 g/dL (ref 30.0–36.0)
MCV: 93.4 fL (ref 78.0–100.0)
PLATELETS: 196 10*3/uL (ref 150–400)
RBC: 3.95 MIL/uL (ref 3.87–5.11)
RDW: 13.8 % (ref 11.5–15.5)
WBC: 9 10*3/uL (ref 4.0–10.5)

## 2013-11-07 LAB — VALPROIC ACID LEVEL: Valproic Acid Lvl: 22.5 ug/mL — ABNORMAL LOW (ref 50.0–100.0)

## 2013-11-07 MED ORDER — ACETAMINOPHEN 500 MG PO TABS
1000.0000 mg | ORAL_TABLET | Freq: Once | ORAL | Status: AC
  Administered 2013-11-07: 1000 mg via ORAL
  Filled 2013-11-07: qty 2

## 2013-11-07 NOTE — ED Notes (Signed)
EMS reports BP 140/90 P 100 R 20
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
## 2013-11-08 NOTE — Discharge Instructions (Signed)

## 2013-12-01 ENCOUNTER — Emergency Department (HOSPITAL_COMMUNITY)
Admission: EM | Admit: 2013-12-01 | Discharge: 2013-12-01 | Discharge status: Against Medical Advice | Payer: Medicaid Other

## 2013-12-01 NOTE — ED Notes (Signed)
Pt did not answer x 2 

## 2013-12-01 NOTE — ED Notes (Signed)
Pt did not answer x 3 

## 2013-12-01 NOTE — ED Notes (Signed)
Pt did not answer.

## 2013-12-06 ENCOUNTER — Emergency Department (HOSPITAL_COMMUNITY): Payer: Medicaid Other

## 2013-12-06 ENCOUNTER — Encounter (HOSPITAL_COMMUNITY): Payer: Self-pay | Admitting: Emergency Medicine

## 2013-12-06 ENCOUNTER — Emergency Department (HOSPITAL_COMMUNITY)
Admission: EM | Admit: 2013-12-06 | Discharge: 2013-12-06 | Disposition: A | Discharge status: Home / Self Care | Payer: Medicaid Other | Attending: Emergency Medicine | Admitting: Emergency Medicine

## 2013-12-06 DIAGNOSIS — Z79899 Other long term (current) drug therapy: Secondary | ICD-10-CM | POA: Diagnosis not present

## 2013-12-06 DIAGNOSIS — Z9889 Other specified postprocedural states: Secondary | ICD-10-CM | POA: Diagnosis not present

## 2013-12-06 DIAGNOSIS — Z86718 Personal history of other venous thrombosis and embolism: Secondary | ICD-10-CM | POA: Insufficient documentation

## 2013-12-06 DIAGNOSIS — IMO0002 Reserved for concepts with insufficient information to code with codable children: Secondary | ICD-10-CM | POA: Diagnosis not present

## 2013-12-06 DIAGNOSIS — G40909 Epilepsy, unspecified, not intractable, without status epilepticus: Secondary | ICD-10-CM | POA: Diagnosis not present

## 2013-12-06 DIAGNOSIS — M129 Arthropathy, unspecified: Secondary | ICD-10-CM | POA: Insufficient documentation

## 2013-12-06 DIAGNOSIS — I10 Essential (primary) hypertension: Secondary | ICD-10-CM | POA: Diagnosis not present

## 2013-12-06 DIAGNOSIS — M79609 Pain in unspecified limb: Secondary | ICD-10-CM | POA: Insufficient documentation

## 2013-12-06 DIAGNOSIS — M7989 Other specified soft tissue disorders: Secondary | ICD-10-CM | POA: Diagnosis present

## 2013-12-06 DIAGNOSIS — K219 Gastro-esophageal reflux disease without esophagitis: Secondary | ICD-10-CM | POA: Insufficient documentation

## 2013-12-06 DIAGNOSIS — J45909 Unspecified asthma, uncomplicated: Secondary | ICD-10-CM | POA: Diagnosis not present

## 2013-12-06 DIAGNOSIS — Z8541 Personal history of malignant neoplasm of cervix uteri: Secondary | ICD-10-CM | POA: Diagnosis not present

## 2013-12-06 DIAGNOSIS — Z9089 Acquired absence of other organs: Secondary | ICD-10-CM | POA: Diagnosis not present

## 2013-12-06 DIAGNOSIS — G8929 Other chronic pain: Secondary | ICD-10-CM | POA: Insufficient documentation

## 2013-12-06 DIAGNOSIS — F17200 Nicotine dependence, unspecified, uncomplicated: Secondary | ICD-10-CM | POA: Diagnosis not present

## 2013-12-06 DIAGNOSIS — M79605 Pain in left leg: Secondary | ICD-10-CM

## 2013-12-06 MED ORDER — OXYCODONE-ACETAMINOPHEN 5-325 MG PO TABS
2.0000 | ORAL_TABLET | Freq: Once | ORAL | Status: AC
  Administered 2013-12-06: 2 via ORAL
  Filled 2013-12-06: qty 2

## 2013-12-06 NOTE — ED Provider Notes (Signed)
Medical screening examination/treatment/procedure(s) were conducted as a shared visit with non-physician practitioner(s) and myself.  I personally evaluated the patient during the encounter.   EKG Interpretation None       Leota Jacobsen, MD 12/06/13 [phone]

## 2013-12-06 NOTE — Discharge Instructions (Signed)
Please follow up with your primary care physician in 1-2 days. If you do not have one please call the [place] number listed above. Please take your at home pain medications as prescribed for pain. Please read all discharge instructions and return precautions.   Knee Pain The knee is the complex joint between your thigh and your lower leg. It is made up of bones, tendons, ligaments, and cartilage. The bones that make up the knee are:  The femur in the thigh.  The tibia and fibula in the lower leg.  The patella or kneecap riding in the groove on the lower femur. CAUSES  Knee pain is a common complaint with many causes. A few of these causes are:  Injury, such as:  A ruptured ligament or tendon injury.  Torn cartilage.  Medical conditions, such as:  Gout  Arthritis  Infections  Overuse, over training, or overdoing a physical activity. Knee pain can be minor or severe. Knee pain can accompany debilitating injury. Minor knee problems often respond well to self-care measures or get well on their own. More serious injuries may need medical intervention or even surgery. SYMPTOMS The knee is complex. Symptoms of knee problems can vary widely. Some of the problems are:  Pain with movement and weight bearing.  Swelling and tenderness.  Buckling of the knee.  Inability to straighten or extend your knee.  Your knee locks and you cannot straighten it.  Warmth and redness with pain and fever.  Deformity or dislocation of the kneecap. DIAGNOSIS  Determining what is wrong may be very straight forward such as when there is an injury. It can also be challenging because of the complexity of the knee. Tests to make a diagnosis may include:  Your caregiver taking a history and doing a physical exam.  Routine X-rays can be used to rule out other problems. X-rays will not reveal a cartilage tear. Some injuries of the knee can be diagnosed by:  Arthroscopy a  surgical technique by which a small video camera is inserted through tiny incisions on the sides of the knee. This procedure is used to examine and repair internal knee joint problems. Tiny instruments can be used during arthroscopy to repair the torn knee cartilage (meniscus).  Arthrography is a radiology technique. A contrast liquid is directly injected into the knee joint. Internal structures of the knee joint then become visible on X-ray film.  An MRI scan is a non X-ray radiology procedure in which magnetic fields and a computer produce two- or three-dimensional images of the inside of the knee. Cartilage tears are often visible using an MRI scanner. MRI scans have largely replaced arthrography in diagnosing cartilage tears of the knee.  Blood work.  Examination of the fluid that helps to lubricate the knee joint (synovial fluid). This is done by taking a sample out using a needle and a syringe. TREATMENT The treatment of knee problems depends on the cause. Some of these treatments are:  Depending on the injury, proper casting, splinting, surgery, or physical therapy care will be needed.  Give yourself adequate recovery time. Do not overuse your joints. If you begin to get sore during workout routines, back off. Slow down or do fewer repetitions.  For repetitive activities such as cycling or running, maintain your strength and nutrition.  Alternate muscle groups. For example, if you are a weight lifter, work the upper body on one day and the lower body the next.  Either tight or weak  muscles do not give the proper support for your knee. Tight or weak muscles do not absorb the stress placed on the knee joint. Keep the muscles surrounding the knee strong.  Take care of mechanical problems.  If you have flat feet, orthotics or special shoes may help. See your caregiver if you need help.  Arch supports, sometimes with wedges on the inner or outer aspect of the heel, can help. These can  shift pressure away from the side of the knee most bothered by osteoarthritis.  A brace called an "unloader" brace also may be used to help ease the pressure on the most arthritic side of the knee.  If your caregiver has prescribed crutches, braces, wraps or ice, use as directed. The acronym for this is PRICE. This means protection, rest, ice, compression, and elevation.  Nonsteroidal anti-inflammatory drugs (NSAIDs), can help relieve pain. But if taken immediately after an injury, they may actually increase swelling. Take NSAIDs with food in your stomach. Stop them if you develop stomach problems. Do not take these if you have a history of ulcers, stomach pain, or bleeding from the bowel. Do not take without your caregiver's approval if you have problems with fluid retention, heart failure, or kidney problems.  For ongoing knee problems, physical therapy may be helpful.  Glucosamine and chondroitin are over-the-counter dietary supplements. Both may help relieve the pain of osteoarthritis in the knee. These medicines are different from the usual anti-inflammatory drugs. Glucosamine may decrease the rate of cartilage destruction.  Injections of a corticosteroid drug into your knee joint may help reduce the symptoms of an arthritis flare-up. They may provide pain relief that lasts a few months. You may have to wait a few months between injections. The injections do have a small increased risk of infection, water retention, and elevated blood sugar levels.  Hyaluronic acid injected into damaged joints may ease pain and provide lubrication. These injections may work by reducing inflammation. A series of shots may give relief for as long as 6 months.  Topical painkillers. Applying certain ointments to your skin may help relieve the pain and stiffness of osteoarthritis. Ask your pharmacist for suggestions. Many over the-counter products are approved for temporary relief of arthritis pain.  In some  countries, doctors often prescribe topical NSAIDs for relief of chronic conditions such as arthritis and tendinitis. A review of treatment with NSAID creams found that they worked as well as oral medications but without the serious side effects. PREVENTION  Maintain a healthy weight. Extra pounds put more strain on your joints.  Get strong, stay limber. Weak muscles are a common cause of knee injuries. Stretching is important. Include flexibility exercises in your workouts.  Be smart about exercise. If you have osteoarthritis, chronic knee pain or recurring injuries, you may need to change the way you exercise. This does not mean you have to stop being active. If your knees ache after jogging or playing basketball, consider switching to swimming, water aerobics, or other low-impact activities, at least for a few days a week. Sometimes limiting high-impact activities will provide relief.  Make sure your shoes fit well. Choose footwear that is right for your sport.  Protect your knees. Use the proper gear for knee-sensitive activities. Use kneepads when playing volleyball or laying carpet. Buckle your seat belt every time you drive. Most shattered kneecaps occur in car accidents.  Rest when you are tired. SEEK MEDICAL CARE IF:  You have knee pain that is continual and does not  seem to be getting better.  SEEK IMMEDIATE MEDICAL CARE IF:  Your knee joint feels hot to the touch and you have a high fever. MAKE SURE YOU:   Understand these instructions.  Will watch your condition.  Will get help right away if you are not doing well or get worse. Document Released: 01/27/2007 Document Revised: 06/24/2011 Document Reviewed: 01/27/2007 [facility] Patient Information 2015 [place], [place]. This information is not intended to replace advice given to you by your health care provider. Make sure you discuss any questions you have with your health care provider.

## 2013-12-06 NOTE — ED Provider Notes (Signed)
Medical screening examination/treatment/procedure(s) were conducted as a shared visit with non-physician practitioner(s) and myself.  I personally evaluated the patient during the encounter.   EKG Interpretation None     Patient here complaining of chronic left knee pain and swelling worse the last several weeks. Swelling is localized to her left anterior tibia. 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 Patient's exam without evidence of septic joint. Patient likely inflammatory process and will place on anti-inflammatories and discharged.  Lori Jacobsen, MD 12/06/13 1102

## 2013-12-06 NOTE — ED Notes (Signed)
Pt reports dx with blood clots last year in left leg; reports swelling to left leg for several weeks, thinks she has internal drainage on the inside of her left leg. Denies cp or sob. Pt can bear wt. A x 4

## 2013-12-06 NOTE — ED Provider Notes (Signed)
CSN: [id number]     Arrival date & time 12/06/13  8338 History   First MD Initiated Contact with Patient 12/06/13 0932     Chief Complaint  Patient presents with  . Leg Swelling     (Consider location/radiation/quality/duration/timing/severity/associated sxs/prior Treatment) HPI Comments: Patient is a 38 yo F PMHx significant for seizures, asthma, HTN, GERD, DVT with IVC filter placement, Chronic back pain presenting to the ED for two weeks of left knee pain and swelling w/o known injury. Patient states "it feels like fluid ruptured in my knee and is draining down my leg." Alleviating factors: none. Aggravating factors: palpation, ambulation. Medications tried prior to arrival: Tylenol, HCTZ. Denies any fevers, chills, CP, SOB.       Past Medical History  Diagnosis Date  . 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 Seizures     due to head trauma as adult  . Asthma     as child  . Meningioma   . Gallstones     s/p cholecystectomy  . HTN (hypertension)   . GERD (gastroesophageal reflux disease)   . Allergic rhinitis   . Cigarette nicotine dependence with withdrawal   . DVT (deep venous thrombosis)   . Chronic back pain   . Sciatic pain   . Arthritis    Past Surgical History  Procedure Laterality Date  . Brain surgery      2013 to remove a meningioma  . Cholecystectomy     Family History  Problem Relation Age of Onset  . Other Mother     varicose vein  . Asthma Mother   . High blood pressure Mother   . Deep vein thrombosis Neg Hx   . Pulmonary embolism Neg Hx   . Cancer    . Diabetes    . High blood pressure    . Asthma    . Thyroid disease    . Cancer Father    History  Substance Use Topics  . Smoking status: Current Every Day Smoker -- 0.25 packs/day for 16 years    Types: Cigarettes  . Smokeless tobacco: Never Used  . Alcohol Use: No     Comment: Smokes 3 joints a day   OB History   Grav Para Term Preterm Abortions TAB SAB Ect Mult Living   3 1 1  2  2    1      Review of Systems  Respiratory: Negative for cough, shortness of breath and wheezing.   Cardiovascular: Positive for leg swelling. Negative for chest pain.  Gastrointestinal: Negative for nausea and vomiting.  Musculoskeletal: Positive for arthralgias, joint swelling and myalgias.  All other systems reviewed and are negative.     Allergies  Ibuprofen; Clindamycin/lincomycin; Coconut flavor; Flagyl; Latex; Other; and Aspirin  Home Medications   Prior to Admission medications   Medication Sig Start Date End Date Taking? Authorizing Provider  acetaminophen (TYLENOL) 500 MG tablet Take 1,000 mg by mouth every 6 (six) hours as needed for moderate pain or headache.   Yes Historical Provider, MD  albuterol (PROVENTIL HFA;VENTOLIN HFA) 108 (90 BASE) MCG/ACT inhaler Inhale 2 puffs into the lungs every 4 (four) hours as needed for wheezing or shortness of breath.    Yes Historical Provider, MD  beclomethasone (QVAR) 40 MCG/ACT inhaler Inhale 2 puffs into the lungs 2 (two) times daily.    Yes Historical Provider, MD  benzocaine (HURRICAINE) 20 % oral spray Apply 1 application topically 3 (three) times daily as  needed for pain.   Yes Historical Provider, MD  cyclobenzaprine (FLEXERIL) 10 MG tablet Take 1 tablet (10 mg total) by mouth 2 (two) times daily as needed for muscle spasms. 02/01/13  Yes Larence Penning, MD  diazepam (VALIUM) 10 MG tablet Take 10 mg by mouth every 6 (six) hours as needed for anxiety.   Yes Historical Provider, MD  divalproex (DEPAKOTE) 500 MG DR tablet Take 1 tablet (500 mg total) by mouth 3 (three) times daily. 07/09/13  Yes Harvie Heck, PA-C  fluticasone (FLONASE) 50 MCG/ACT nasal spray Place 1 spray into the nose 2 (two) times daily.   Yes Historical Provider, MD  folic acid (FOLVITE) 1 MG tablet Take 1 mg by mouth daily.   Yes Historical Provider, MD  gabapentin (NEURONTIN) 400 MG capsule Take 800 mg by mouth 2 (two) times daily.   Yes Historical Provider, MD   hydrochlorothiazide (HYDRODIURIL) 25 MG tablet Take 1 tablet (25 mg total) by mouth daily. 07/09/13  Yes Harvie Heck, PA-C  levETIRAcetam (KEPPRA) 1000 MG tablet Take 1 tablet (1,000 mg total) by mouth 2 (two) times daily. 07/09/13  Yes Harvie Heck, PA-C  mirtazapine (REMERON) 15 MG tablet Take 15 mg by mouth at bedtime.   Yes Historical Provider, MD  omeprazole (PRILOSEC) 20 MG capsule Take 20 mg by mouth 2 (two) times daily.   Yes Historical Provider, MD  oxyCODONE-acetaminophen (PERCOCET) 10-325 MG per tablet Take 1 tablet by mouth every 4 (four) hours as needed for pain.   Yes Historical Provider, MD  PARoxetine (PAXIL) 40 MG tablet Take 1 tablet (40 mg total) by mouth daily. 07/09/13  Yes Harvie Heck, PA-C  promethazine (PHENERGAN) 6.25 MG/5ML syrup Take 12.5 mg by mouth every 6 (six) hours as needed for nausea or vomiting.   Yes Historical Provider, MD  propranolol (INDERAL) 20 MG tablet Take 1 tablet (20 mg total) by mouth 2 (two) times daily. 07/09/13  Yes Harvie Heck, PA-C  sodium chloride (OCEAN) 0.65 % nasal spray Place 1 spray into the nose 2 (two) times daily as needed for congestion.    Yes Historical Provider, MD  triamcinolone ointment (KENALOG) 0.5 % Apply 1 application topically 2 (two) times daily.   Yes Historical Provider, MD   BP 114/69  Pulse 78  Temp(Src) 98.4 F (36.9 C) (Oral)  Resp 25  SpO2 97%  LMP 11/13/2013 Physical Exam  Nursing note and vitals reviewed. Constitutional: She is oriented to person, place, and time. She appears well-developed and well-nourished. No distress.  HENT:  Head: Normocephalic and atraumatic.  Right Ear: External ear normal.  Left Ear: External ear normal.  Nose: Nose normal.  Mouth/Throat: Oropharynx is clear and moist. No oropharyngeal exudate.  Eyes: Conjunctivae are normal.  Neck: Normal range of motion. Neck supple.  Cardiovascular: Normal rate, regular rhythm, normal heart sounds and intact distal pulses.    Pulmonary/Chest: Effort normal and breath sounds normal. No respiratory distress.  Abdominal: Soft. There is no tenderness.  Musculoskeletal:       Right knee: Normal.       Left knee: She exhibits swelling. She exhibits no effusion, no ecchymosis, no deformity, no laceration, no erythema and normal alignment. Tenderness found.       Right ankle: Normal.       Left ankle: Normal.       Right upper leg: Normal.       Left upper leg: She exhibits tenderness and swelling. She exhibits no bony tenderness.  Right lower leg: Normal.       Left lower leg: Normal.       Right foot: Normal.       Left foot: Normal.  Neurological: She is alert and oriented to person, place, and time.  Skin: Skin is warm and dry. She is not diaphoretic.  Psychiatric: She has a normal mood and affect.    ED Course  Procedures (including critical care time) Medications  oxyCODONE-acetaminophen (PERCOCET/ROXICET) 5-325 MG per tablet 2 tablet (2 tablets Oral Given 12/06/13 1054)    Labs Review Labs Reviewed - No data to display  Imaging Review Dg Abd 1 View  12/06/2013   CLINICAL DATA:  Post IVC filter placement  EXAM: ABDOMEN - 1 VIEW  COMPARISON:  CT abdomen and pelvis 03/16/2013  FINDINGS: Surgical clips RIGHT upper quadrant secondary to cholecystectomy.  IVC filter identified RIGHT paraspinal at inferior L2 to superior endplate L4.  Bowel gas pattern normal.  No bowel dilatation or bowel wall thickening.  Multiple pelvic phleboliths without obvious urinary tract calcification.  IMPRESSION: IVC filter.  Otherwise negative exam.   Electronically Signed   By: Lavonia Dana M.D.   On: 12/06/2013 10:21   Dg Knee Complete 4 Views Left  12/06/2013   CLINICAL DATA:  Medial pain since blood clot last year. No trauma history submitted.  EXAM: LEFT KNEE - COMPLETE 4+ VIEW  COMPARISON:  07/09/2013  FINDINGS: Minimal medial compartment joint space narrowing and subchondral sclerosis. Patellofemoral degenerative  irregularity is also mild. No definite joint effusion. No acute fracture or dislocation.  IMPRESSION: Mild osteoarthritis, without acute osseous abnormality.   Electronically Signed   By: Abigail Miyamoto M.D.   On: 12/06/2013 10:21     EKG Interpretation None      MDM   Final diagnoses:  Left leg pain    Filed Vitals:   12/06/13 1135  BP: 114/69  Pulse: 78  Temp: 98.4 F (36.9 C)  Resp:    Afebrile, NAD, non-toxic appearing, AAOx4.  Neurovascularly intact. Normal sensation. Swelling to left knee and thigh. Imaging reviewed. IVC filter in place. Patient with chronic symptoms. Will not obtain duplex of LLE as symptoms are chronic and patient with IVC filter. Patient's exam without evidence of septic joint. Patient likely inflammatory process and advised NSAID use. Advised PCP f/u. Return precautions discussed. Patient is agreeable to plan. Patient is stable at time of discharge. Patient d/w with Dr. Zenia Resides, agrees with plan.         Harlow Mares, PA-C 12/06/13 1607

## 2013-12-06 NOTE — ED Notes (Signed)
Patient transported to X-ray 

## 2013-12-12 ENCOUNTER — Emergency Department (HOSPITAL_COMMUNITY): Payer: Medicaid Other

## 2013-12-12 ENCOUNTER — Inpatient Hospital Stay (HOSPITAL_COMMUNITY): Payer: Medicaid Other

## 2013-12-12 ENCOUNTER — Telehealth: Payer: Self-pay | Admitting: Infectious Disease

## 2013-12-12 ENCOUNTER — Encounter (HOSPITAL_COMMUNITY): Payer: Self-pay | Admitting: Emergency Medicine

## 2013-12-12 ENCOUNTER — Inpatient Hospital Stay (HOSPITAL_COMMUNITY)
Admission: EM | Admit: 2013-12-12 | Discharge: 2013-12-12 | Disposition: A | Discharge status: Other Acute Inpatient Hospital | Payer: Medicaid Other | Attending: Obstetrics & Gynecology | Admitting: Obstetrics & Gynecology

## 2013-12-12 DIAGNOSIS — Z9089 Acquired absence of other organs: Secondary | ICD-10-CM | POA: Diagnosis not present

## 2013-12-12 DIAGNOSIS — E669 Obesity, unspecified: Secondary | ICD-10-CM | POA: Insufficient documentation

## 2013-12-12 DIAGNOSIS — Z3202 Encounter for pregnancy test, result negative: Secondary | ICD-10-CM | POA: Insufficient documentation

## 2013-12-12 DIAGNOSIS — Z79899 Other long term (current) drug therapy: Secondary | ICD-10-CM | POA: Insufficient documentation

## 2013-12-12 DIAGNOSIS — Z87828 Personal history of other (healed) physical injury and trauma: Secondary | ICD-10-CM | POA: Diagnosis not present

## 2013-12-12 DIAGNOSIS — Z86718 Personal history of other venous thrombosis and embolism: Secondary | ICD-10-CM | POA: Insufficient documentation

## 2013-12-12 DIAGNOSIS — Z91040 Latex allergy status: Secondary | ICD-10-CM | POA: Insufficient documentation

## 2013-12-12 DIAGNOSIS — IMO0002 Reserved for concepts with insufficient information to code with codable children: Secondary | ICD-10-CM | POA: Insufficient documentation

## 2013-12-12 DIAGNOSIS — G40309 Generalized idiopathic epilepsy and epileptic syndromes, not intractable, without status epilepticus: Secondary | ICD-10-CM | POA: Diagnosis not present

## 2013-12-12 DIAGNOSIS — R109 Unspecified abdominal pain: Secondary | ICD-10-CM | POA: Diagnosis present

## 2013-12-12 DIAGNOSIS — Z86011 Personal history of benign neoplasm of the brain: Secondary | ICD-10-CM | POA: Diagnosis not present

## 2013-12-12 DIAGNOSIS — M129 Arthropathy, unspecified: Secondary | ICD-10-CM | POA: Diagnosis not present

## 2013-12-12 DIAGNOSIS — B20 Human immunodeficiency virus [HIV] disease: Secondary | ICD-10-CM

## 2013-12-12 DIAGNOSIS — K219 Gastro-esophageal reflux disease without esophagitis: Secondary | ICD-10-CM | POA: Insufficient documentation

## 2013-12-12 DIAGNOSIS — G8929 Other chronic pain: Secondary | ICD-10-CM | POA: Insufficient documentation

## 2013-12-12 DIAGNOSIS — R1032 Left lower quadrant pain: Secondary | ICD-10-CM

## 2013-12-12 DIAGNOSIS — F17200 Nicotine dependence, unspecified, uncomplicated: Secondary | ICD-10-CM | POA: Insufficient documentation

## 2013-12-12 DIAGNOSIS — D259 Leiomyoma of uterus, unspecified: Secondary | ICD-10-CM | POA: Diagnosis not present

## 2013-12-12 DIAGNOSIS — I10 Essential (primary) hypertension: Secondary | ICD-10-CM | POA: Diagnosis not present

## 2013-12-12 DIAGNOSIS — Z21 Asymptomatic human immunodeficiency virus [HIV] infection status: Secondary | ICD-10-CM | POA: Diagnosis not present

## 2013-12-12 DIAGNOSIS — J45909 Unspecified asthma, uncomplicated: Secondary | ICD-10-CM | POA: Insufficient documentation

## 2013-12-12 LAB — COMPREHENSIVE METABOLIC PANEL
ALBUMIN: 3.4 g/dL — AB (ref 3.5–5.2)
ALT: 7 U/L (ref 0–35)
ANION GAP: 12 (ref 5–15)
AST: 14 U/L (ref 0–37)
Alkaline Phosphatase: 66 U/L (ref 39–117)
BILIRUBIN TOTAL: 0.4 mg/dL (ref 0.3–1.2)
BUN: 6 mg/dL (ref 6–23)
CHLORIDE: 99 meq/L (ref 96–112)
CO2: 26 mEq/L (ref 19–32)
CREATININE: 0.78 mg/dL (ref 0.50–1.10)
Calcium: 8.7 mg/dL (ref 8.4–10.5)
GFR calc non Af Amer: >90 mL/min (ref >90)
GLUCOSE: 78 mg/dL (ref 70–99)
Potassium: 3.9 mEq/L (ref 3.7–5.3)
Sodium: 137 mEq/L (ref 137–147)
Total Protein: 7.2 g/dL (ref 6.0–8.3)

## 2013-12-12 LAB — CBC WITH DIFFERENTIAL/PLATELET
Basophils Absolute: 0 10*3/uL (ref 0.0–0.1)
Basophils Relative: 0 % (ref 0–1)
Eosinophils Absolute: 0 10*3/uL (ref 0.0–0.7)
Eosinophils Relative: 1 % (ref 0–5)
HEMATOCRIT: 39.2 % (ref 36.0–46.0)
HEMOGLOBIN: 13.3 g/dL (ref 12.0–15.0)
Lymphocytes Relative: 47 % — ABNORMAL HIGH (ref 12–46)
Lymphs Abs: 2.4 10*3/uL (ref 0.7–4.0)
MCH: 31.3 pg (ref 26.0–34.0)
MCHC: 33.9 g/dL (ref 30.0–36.0)
MCV: 92.2 fL (ref 78.0–100.0)
MONO ABS: 0.4 10*3/uL (ref 0.1–1.0)
MONOS PCT: 8 % (ref 3–12)
Neutro Abs: 2.2 10*3/uL (ref 1.7–7.7)
Neutrophils Relative %: 44 % (ref 43–77)
Platelets: 222 10*3/uL (ref 150–400)
RBC: 4.25 MIL/uL (ref 3.87–5.11)
RDW: 13.7 % (ref 11.5–15.5)
WBC: 5 10*3/uL (ref 4.0–10.5)

## 2013-12-12 LAB — WET PREP, GENITAL
Clue Cells Wet Prep HPF POC: NONE SEEN
Trich, Wet Prep: NONE SEEN
Yeast Wet Prep HPF POC: NONE SEEN

## 2013-12-12 LAB — URINALYSIS, ROUTINE W REFLEX MICROSCOPIC
BILIRUBIN URINE: NEGATIVE
GLUCOSE, UA: NEGATIVE mg/dL
KETONES UR: 15 mg/dL — AB
Leukocytes, UA: NEGATIVE
NITRITE: NEGATIVE
PH: 7 (ref 5.0–8.0)
Protein, ur: NEGATIVE mg/dL
SPECIFIC GRAVITY, URINE: 1.016 (ref 1.005–1.030)
Urobilinogen, UA: 0.2 mg/dL (ref 0.0–1.0)

## 2013-12-12 LAB — HIV ANTIBODY (ROUTINE TESTING W REFLEX): HIV: REACTIVE — AB

## 2013-12-12 LAB — HIV 1/2 CONFIRMATION
HIV 1 ANTIBODY: POSITIVE — AB
HIV-2 Ab: NEGATIVE

## 2013-12-12 LAB — URINE MICROSCOPIC-ADD ON

## 2013-12-12 LAB — POC URINE PREG, ED: PREG TEST UR: NEGATIVE

## 2013-12-12 MED ORDER — HYDROCODONE-ACETAMINOPHEN 5-325 MG PO TABS
2.0000 | ORAL_TABLET | Freq: Once | ORAL | Status: AC
  Administered 2013-12-12: 2 via ORAL
  Filled 2013-12-12: qty 2

## 2013-12-12 MED ORDER — MORPHINE SULFATE 4 MG/ML IJ SOLN
4.0000 mg | Freq: Once | INTRAMUSCULAR | Status: DC
  Filled 2013-12-12: qty 1

## 2013-12-12 MED ORDER — MORPHINE SULFATE 4 MG/ML IJ SOLN
4.0000 mg | Freq: Once | INTRAMUSCULAR | Status: AC
  Administered 2013-12-12: 4 mg via INTRAVENOUS

## 2013-12-12 MED ORDER — ONDANSETRON 4 MG PO TBDP
8.0000 mg | ORAL_TABLET | Freq: Once | ORAL | Status: AC
  Administered 2013-12-12: 8 mg via ORAL
  Filled 2013-12-12: qty 2

## 2013-12-12 MED ORDER — OXYCODONE-ACETAMINOPHEN 5-325 MG PO TABS: 1.0000 | ORAL_TABLET | ORAL | Status: DC | PRN

## 2013-12-12 NOTE — ED Notes (Signed)
Notified [facility] for transport to [facility]

## 2013-12-12 NOTE — MAU Provider Note (Signed)
Chief Complaint: Abdominal Pain and Vaginal Bleeding   First Provider Initiated Contact with Patient 12/12/13 1718     SUBJECTIVE HPI: Lori Ford is a 38 y.o. [id number] nonpregnant female who was transported to maternity admissions from [facility] ED emergency Department via [facility] for possible left ovarian torsion. (See note).   Ultrasound showed Slight decrease in the arterial venous waveforms within the left ovary although a definitive torsion cannot be diagnosed.  She reports mild low abdominal cramping yesterday she saw may be gas pains, but woke up with severe, sharp left lower quadrant pain that has remained constant throughout the day today. Has history of bladder problems that have caused similar pain.   Receive Norco and morphine at [facility] with adequate pain control.  Past Medical History  Diagnosis Date  . 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 Seizures     due to head trauma as adult  . Asthma     as child  . Meningioma   . Gallstones     s/p cholecystectomy  . HTN (hypertension)   . GERD (gastroesophageal reflux disease)   . Allergic rhinitis   . Cigarette nicotine dependence with withdrawal   . DVT (deep venous thrombosis)   . Chronic back pain   . Sciatic pain   . Arthritis    OB History  Gravida Para Term Preterm AB SAB TAB Ectopic Multiple Living  3 1 1  2  2   1     # Outcome Date GA Lbr Len/2nd Weight Sex Delivery Anes PTL Lv  3 TAB           2 TAB           1 TRM              Past Surgical History  Procedure Laterality Date  . Brain surgery      2013 to remove a meningioma  . Cholecystectomy     History   Social History  . Marital Status: Single    Spouse Name: N/A    Number of Children: 1  . Years of Education: college   Occupational History  . Disabled    Social History Main Topics  . Smoking status: Current Every Day Smoker -- 0.25 packs/day for 16 years    Types: Cigarettes  . Smokeless tobacco: Never Used  . Alcohol Use:  No     Comment: Smokes 3 joints a day  . Drug Use: Yes    Special: Marijuana     Comment: marijuana/ occasionally  . Sexual Activity: Yes    Partners: Male    Birth Control/ Protection: None   Other Topics Concern  . Not on file   Social History Narrative   Lives with fiance.  Does not have a PCP.  [address] Dr. Jimmye Norman, walk in.  Has insurance.  [facility] [address].     No current facility-administered medications on file prior to encounter.   Current Outpatient Prescriptions on File Prior to Encounter  Medication Sig Dispense Refill  . acetaminophen (TYLENOL) 500 MG tablet Take 1,000 mg by mouth every 6 (six) hours as needed for moderate pain or headache.      . albuterol (PROVENTIL HFA;VENTOLIN HFA) 108 (90 BASE) MCG/ACT inhaler Inhale 2 puffs into the lungs every 4 (four) hours as needed for wheezing or shortness of breath.       . beclomethasone (QVAR) 40 MCG/ACT inhaler Inhale 2 puffs into  the lungs 2 (two) times daily.       . benzocaine (HURRICAINE) 20 % oral spray Apply 1 application topically 3 (three) times daily as needed for pain.      . cyclobenzaprine (FLEXERIL) 10 MG tablet Take 1 tablet (10 mg total) by mouth 2 (two) times daily as needed for muscle spasms.  2 tablet  0  . diazepam (VALIUM) 10 MG tablet Take 10 mg by mouth every 6 (six) hours as needed for anxiety.      . divalproex (DEPAKOTE) 500 MG DR tablet Take 1 tablet (500 mg total) by mouth 3 (three) times daily.  90 tablet  5  . fluticasone (FLONASE) 50 MCG/ACT nasal spray Place 1 spray into the nose 2 (two) times daily.      . folic acid (FOLVITE) 1 MG tablet Take 1 mg by mouth daily.      Marland Kitchen gabapentin (NEURONTIN) 400 MG capsule Take 800 mg by mouth 2 (two) times daily.      . hydrochlorothiazide (HYDRODIURIL) 25 MG tablet Take 1 tablet (25 mg total) by mouth daily.  30 tablet  0  . levETIRAcetam (KEPPRA) 1000 MG tablet Take 1 tablet (1,000 mg total) by mouth 2 (two) times daily.  30 tablet  0  .  mirtazapine (REMERON) 15 MG tablet Take 15 mg by mouth at bedtime.      Marland Kitchen omeprazole (PRILOSEC) 20 MG capsule Take 20 mg by mouth 2 (two) times daily.      Marland Kitchen oxyCODONE-acetaminophen (PERCOCET) 10-325 MG per tablet Take 1 tablet by mouth every 4 (four) hours as needed for pain.      Marland Kitchen PARoxetine (PAXIL) 40 MG tablet Take 1 tablet (40 mg total) by mouth daily.  30 tablet  0  . promethazine (PHENERGAN) 6.25 MG/5ML syrup Take 12.5 mg by mouth every 6 (six) hours as needed for nausea or vomiting.      . propranolol (INDERAL) 20 MG tablet Take 1 tablet (20 mg total) by mouth 2 (two) times daily.  60 tablet  0  . sodium chloride (OCEAN) 0.65 % nasal spray Place 1 spray into the nose 2 (two) times daily as needed for congestion.       . triamcinolone ointment (KENALOG) 0.5 % Apply 1 application topically 2 (two) times daily.       Allergies  Allergen Reactions  . Ibuprofen Shortness Of Breath    wheezing  . Clindamycin/Lincomycin Hives  . Coconut Flavor Hives  . Flagyl [Metronidazole] Other (See Comments)    "it gave me a real bad bacterial infection"  . Latex Other (See Comments)    Rash, wheezing  . Other Swelling    "GRAPE SUBSTANCE"  . Aspirin Palpitations    wheezing    ROS: Pertinent positive items in HPI. Positive for urinary stress incontinence. Negative for fever, chills, nausea, vomiting, diarrhea, constipation, dysuria, hematuria, urgency, frequency or vaginal discharge.  OBJECTIVE Blood pressure 118/71, pulse 72, temperature 98.8 F (37.1 C), temperature source Oral, resp. rate 16, height 5\' 5"  (1.651 m), weight 104.327 kg (230 lb), last menstrual period 11/13/2013, SpO2 99.00%. GENERAL: Well-developed, well-nourished female in mild distress.  HEENT: Normocephalic HEART: normal rate RESP: normal effort ABDOMEN: Soft, moderate suprapubic and left lower quadrant tenderness. Negative rebound. Positive bowel sounds x4. EXTREMITIES: Nontender, no edema NEURO: Alert and  oriented SPECULUM EXAM: Deferred due to recent exam.  LAB RESULTS Results for orders placed during the hospital encounter of 12/12/13 (from the past 24 hour(s))  URINALYSIS, ROUTINE W REFLEX MICROSCOPIC     Status: Abnormal   Collection Time    12/12/13 11:39 AM      Result Value Ref Range   Color, Urine YELLOW  YELLOW   APPearance CLOUDY (*) CLEAR   Specific Gravity, Urine 1.016  1.005 - 1.030   pH 7.0  5.0 - 8.0   Glucose, UA NEGATIVE  NEGATIVE mg/dL   Hgb urine dipstick LARGE (*) NEGATIVE   Bilirubin Urine NEGATIVE  NEGATIVE   Ketones, ur 15 (*) NEGATIVE mg/dL   Protein, ur NEGATIVE  NEGATIVE mg/dL   Urobilinogen, UA 0.2  0.0 - 1.0 mg/dL   Nitrite NEGATIVE  NEGATIVE   Leukocytes, UA NEGATIVE  NEGATIVE  URINE MICROSCOPIC-ADD ON     Status: Abnormal   Collection Time    12/12/13 11:39 AM      Result Value Ref Range   Squamous Epithelial / LPF MANY (*) RARE   WBC, UA 0-2  <3 WBC/hpf   RBC / HPF 0-2  <3 RBC/hpf  POC URINE PREG, ED     Status: None   Collection Time    12/12/13 11:45 AM      Result Value Ref Range   Preg Test, Ur NEGATIVE  NEGATIVE  WET PREP, GENITAL     Status: Abnormal   Collection Time    12/12/13 12:01 PM      Result Value Ref Range   Yeast Wet Prep HPF POC NONE SEEN  NONE SEEN   Trich, Wet Prep NONE SEEN  NONE SEEN   Clue Cells Wet Prep HPF POC NONE SEEN  NONE SEEN   WBC, Wet Prep HPF POC FEW (*) NONE SEEN  CBC WITH DIFFERENTIAL     Status: Abnormal   Collection Time    12/12/13 12:28 PM      Result Value Ref Range   WBC 5.0  4.0 - 10.5 K/uL   RBC 4.25  3.87 - 5.11 MIL/uL   Hemoglobin 13.3  12.0 - 15.0 g/dL   HCT 39.2  36.0 - 46.0 %   MCV 92.2  78.0 - 100.0 fL   MCH 31.3  26.0 - 34.0 pg   MCHC 33.9  30.0 - 36.0 g/dL   RDW 13.7  11.5 - 15.5 %   Platelets 222  150 - 400 K/uL   Neutrophils Relative % 44  43 - 77 %   Neutro Abs 2.2  1.7 - 7.7 K/uL   Lymphocytes Relative 47 (*) 12 - 46 %   Lymphs Abs 2.4  0.7 - 4.0 K/uL   Monocytes Relative 8   3 - 12 %   Monocytes Absolute 0.4  0.1 - 1.0 K/uL   Eosinophils Relative 1  0 - 5 %   Eosinophils Absolute 0.0  0.0 - 0.7 K/uL   Basophils Relative 0  0 - 1 %   Basophils Absolute 0.0  0.0 - 0.1 K/uL  COMPREHENSIVE METABOLIC PANEL     Status: Abnormal   Collection Time    12/12/13 12:28 PM      Result Value Ref Range   Sodium 137  137 - 147 mEq/L   Potassium 3.9  3.7 - 5.3 mEq/L   Chloride 99  96 - 112 mEq/L   CO2 26  19 - 32 mEq/L   Glucose, Bld 78  70 - 99 mg/dL   BUN 6  6 - 23 mg/dL   Creatinine, Ser 0.78  0.50 - 1.10 mg/dL   Calcium  8.7  8.4 - 10.5 mg/dL   Total Protein 7.2  6.0 - 8.3 g/dL   Albumin 3.4 (*) 3.5 - 5.2 g/dL   AST 14  0 - 37 U/L   ALT 7  0 - 35 U/L   Alkaline Phosphatase 66  39 - 117 U/L   Total Bilirubin 0.4  0.3 - 1.2 mg/dL   GFR calc non Af Amer >90  >90 mL/min   GFR calc Af Amer >90  >90 mL/min   Anion gap 12  5 - 15  HIV ANTIBODY (ROUTINE TESTING)     Status: Abnormal   Collection Time    12/12/13 12:28 PM      Result Value Ref Range   HIV 1&2 Ab, 4th Generation Reactive (*) NONREACTIVE  HIV 1/2 CONFIRMATION     Status: Abnormal   Collection Time    12/12/13 12:28 PM      Result Value Ref Range   HIV-1 antibody POSITIVE (*) NEGATIVE   HIV-2 Ab NEGATIVE  NEGATIVE    IMAGING Dg Abd 1 View  12/06/2013   CLINICAL DATA:  Post IVC filter placement  EXAM: ABDOMEN - 1 VIEW  COMPARISON:  CT abdomen and pelvis 03/16/2013  FINDINGS: Surgical clips RIGHT upper quadrant secondary to cholecystectomy.  IVC filter identified RIGHT paraspinal at inferior L2 to superior endplate L4.  Bowel gas pattern normal.  No bowel dilatation or bowel wall thickening.  Multiple pelvic phleboliths without obvious urinary tract calcification.  IMPRESSION: IVC filter.  Otherwise negative exam.   Electronically Signed   By: Lavonia Dana M.D.   On: 12/06/2013 10:21   US Transvaginal Non-ob  12/12/2013   CLINICAL DATA:  Vaginal bleeding and pain  EXAM: TRANSABDOMINAL AND TRANSVAGINAL  ULTRASOUND OF PELVIS  DOPPLER ULTRASOUND OF OVARIES  TECHNIQUE: Both transabdominal and transvaginal ultrasound examinations of the pelvis were performed. Transabdominal technique was performed for global imaging of the pelvis including uterus, ovaries, adnexal regions, and pelvic cul-de-sac.  It was necessary to proceed with endovaginal exam following the transabdominal exam to visualize the ovaries. Color and duplex Doppler ultrasound was utilized to evaluate blood flow to the ovaries.  COMPARISON:  None.  FINDINGS: Uterus  Measurements: 6.9 x 5.1 x 5.4 cm. Fibroid change is noted. The largest of these measures 4.0 cm in greatest dimension.  Endometrium  Thickness: 6.8 mm.  No focal abnormality visualized.  Right ovary  Measurements: 2.6 x 1.7 x 2.1 cm. Normal appearance/no adnexal mass.  Left ovary  Measurements: 2.2 x 1.8 x 2.2 cm. Normal appearance/no adnexal mass.  Pulsed Doppler evaluation of both ovaries demonstrates normal low resistant arterial venous flow within the right ovary. Somewhat dampened waveforms are noted within the left ovary.  Other findings  No free fluid.  IMPRESSION: Slight decrease in the arterial venous waveforms within the left ovary although a definitive torsion cannot be diagnosed. Correlation with the patient's clinical history is recommended.  Fibroid disease   Electronically Signed   By: Inez Catalina M.D.   On: 12/12/2013 14:36   US Pelvis Complete  12/12/2013   CLINICAL DATA:  Vaginal bleeding and pain  EXAM: TRANSABDOMINAL AND TRANSVAGINAL ULTRASOUND OF PELVIS  DOPPLER ULTRASOUND OF OVARIES  TECHNIQUE: Both transabdominal and transvaginal ultrasound examinations of the pelvis were performed. Transabdominal technique was performed for global imaging of the pelvis including uterus, ovaries, adnexal regions, and pelvic cul-de-sac.  It was necessary to proceed with endovaginal exam following the transabdominal exam to visualize the ovaries. Color and  duplex Doppler ultrasound was  utilized to evaluate blood flow to the ovaries.  COMPARISON:  None.  FINDINGS: Uterus  Measurements: 6.9 x 5.1 x 5.4 cm. Fibroid change is noted. The largest of these measures 4.0 cm in greatest dimension.  Endometrium  Thickness: 6.8 mm.  No focal abnormality visualized.  Right ovary  Measurements: 2.6 x 1.7 x 2.1 cm. Normal appearance/no adnexal mass.  Left ovary  Measurements: 2.2 x 1.8 x 2.2 cm. Normal appearance/no adnexal mass.  Pulsed Doppler evaluation of both ovaries demonstrates normal low resistant arterial venous flow within the right ovary. Somewhat dampened waveforms are noted within the left ovary.  Other findings  No free fluid.  IMPRESSION: Slight decrease in the arterial venous waveforms within the left ovary although a definitive torsion cannot be diagnosed. Correlation with the patient's clinical history is recommended.  Fibroid disease   Electronically Signed   By: Inez Catalina M.D.   On: 12/12/2013 14:36   [place] Art/ven Flow Abd Pelv Doppler  12/12/2013   CLINICAL DATA:  Vaginal bleeding and pain  EXAM: TRANSABDOMINAL AND TRANSVAGINAL ULTRASOUND OF PELVIS  DOPPLER ULTRASOUND OF OVARIES  TECHNIQUE: Both transabdominal and transvaginal ultrasound examinations of the pelvis were performed. Transabdominal technique was performed for global imaging of the pelvis including uterus, ovaries, adnexal regions, and pelvic cul-de-sac.  It was necessary to proceed with endovaginal exam following the transabdominal exam to visualize the ovaries. Color and duplex Doppler ultrasound was utilized to evaluate blood flow to the ovaries.  COMPARISON:  None.  FINDINGS: Uterus  Measurements: 6.9 x 5.1 x 5.4 cm. Fibroid change is noted. The largest of these measures 4.0 cm in greatest dimension.  Endometrium  Thickness: 6.8 mm.  No focal abnormality visualized.  Right ovary  Measurements: 2.6 x 1.7 x 2.1 cm. Normal appearance/no adnexal mass.  Left ovary  Measurements: 2.2 x 1.8 x 2.2 cm. Normal appearance/no adnexal  mass.  Pulsed Doppler evaluation of both ovaries demonstrates normal low resistant arterial venous flow within the right ovary. Somewhat dampened waveforms are noted within the left ovary.  Other findings  No free fluid.  IMPRESSION: Slight decrease in the arterial venous waveforms within the left ovary although a definitive torsion cannot be diagnosed. Correlation with the patient's clinical history is recommended.  Fibroid disease   Electronically Signed   By: Inez Catalina M.D.   On: 12/12/2013 14:36   Dg Knee Complete 4 Views Left  12/06/2013   CLINICAL DATA:  Medial pain since blood clot last year. No trauma history submitted.  EXAM: LEFT KNEE - COMPLETE 4+ VIEW  COMPARISON:  07/09/2013  FINDINGS: Minimal medial compartment joint space narrowing and subchondral sclerosis. Patellofemoral degenerative irregularity is also mild. No definite joint effusion. No acute fracture or dislocation.  IMPRESSION: Mild osteoarthritis, without acute osseous abnormality.   Electronically Signed   By: Abigail Miyamoto M.D.   On: 12/06/2013 10:21    MAU COURSE  ASSESSMENT 1. LLQ abdominal pain   2. Uterine leiomyoma, unspecified location   3. Human immunodeficiency virus (HIV) positive     PLAN Discharge home in stable condition per consult with Dr. Roselie Awkward. Comfort measures. Dr. Flonnie Hailstone, (infectious disease) called immediately before patient's departure from maternity admissions to report that her HIV testing from [facility] today was positive. Consultation via telephone and will arrange followup. Support given.     Follow-up Information   Follow up with Harvie Junior, MD. (As needed if symptoms worsen)    Specialty:  Specialist  Contact information:   [address] [address] [phone]       Follow up with [facility]. (As needed in emergencies)    Contact information:   [place] [address]        Medication List         acetaminophen 500  MG tablet  Commonly known as:  TYLENOL  Take 1,000 mg by mouth every 6 (six) hours as needed for moderate pain or headache.     albuterol (2.5 MG/3ML) 0.083% nebulizer solution  Commonly known as:  PROVENTIL  Take 2.5 mg by nebulization every 6 (six) hours as needed for wheezing or shortness of breath.     albuterol 108 (90 BASE) MCG/ACT inhaler  Commonly known as:  PROVENTIL HFA;VENTOLIN HFA  Inhale 2 puffs into the lungs every 4 (four) hours as needed for wheezing or shortness of breath.     beclomethasone 40 MCG/ACT inhaler  Commonly known as:  QVAR  Inhale 2 puffs into the lungs 2 (two) times daily.     benzocaine 20 % oral spray  Commonly known as:  HURRICAINE  Apply 1 application topically 3 (three) times daily as needed for pain.     cyclobenzaprine 10 MG tablet  Commonly known as:  FLEXERIL  Take 1 tablet (10 mg total) by mouth 2 (two) times daily as needed for muscle spasms.     diazepam 10 MG tablet  Commonly known as:  VALIUM  Take 10 mg by mouth every 6 (six) hours as needed for anxiety.     divalproex 500 MG DR tablet  Commonly known as:  DEPAKOTE  Take 1 tablet (500 mg total) by mouth 3 (three) times daily.     fluticasone 50 MCG/ACT nasal spray  Commonly known as:  FLONASE  Place 1 spray into the nose 2 (two) times daily.     folic acid 1 MG tablet  Commonly known as:  FOLVITE  Take 1 mg by mouth daily.     gabapentin 400 MG capsule  Commonly known as:  NEURONTIN  Take 800 mg by mouth 2 (two) times daily.     hydrochlorothiazide 25 MG tablet  Commonly known as:  HYDRODIURIL  Take 1 tablet (25 mg total) by mouth daily.     levETIRAcetam 1000 MG tablet  Commonly known as:  KEPPRA  Take 1 tablet (1,000 mg total) by mouth 2 (two) times daily.     mirtazapine 15 MG tablet  Commonly known as:  REMERON  Take 15 mg by mouth at bedtime.     omeprazole 20 MG capsule  Commonly known as:  PRILOSEC  Take 20 mg by mouth 2 (two) times daily.      oxyCODONE-acetaminophen 10-325 MG per tablet  Commonly known as:  PERCOCET  Take 1 tablet by mouth every 4 (four) hours as needed for pain.     oxyCODONE-acetaminophen 5-325 MG per tablet  Commonly known as:  PERCOCET/ROXICET  Take 1-2 tablets by mouth every 4 (four) hours as needed.     PARoxetine 40 MG tablet  Commonly known as:  PAXIL  Take 1 tablet (40 mg total) by mouth daily.     promethazine 6.25 MG/5ML syrup  Commonly known as:  PHENERGAN  Take 12.5 mg by mouth every 6 (six) hours as needed for nausea or vomiting.     propranolol 20 MG tablet  Commonly known as:  INDERAL  Take 1 tablet (20 mg total) by  mouth 2 (two) times daily.     sodium chloride 0.65 % nasal spray  Commonly known as:  OCEAN  Place 1 spray into the nose 2 (two) times daily as needed for congestion.     triamcinolone ointment 0.5 %  Commonly known as:  KENALOG  Apply 1 application topically 2 (two) times daily.         [place], CNM 12/12/2013  8:45 PM

## 2013-12-12 NOTE — MAU Note (Signed)
RN present during telephone contact with infection control; provided emotional support.

## 2013-12-12 NOTE — Discharge Planning (Signed)
Patient received taxi voucher. Patient was transferred from [facility] and was informed that [facility] would make sure she got a taxi voucher due to being transferred.  Informed patient that taxi voucher are only given to patients returning to shelters.

## 2013-12-12 NOTE — ED Notes (Signed)
Received pt via EMS with c/o low abdominal pain that radiates around to her back and vaginal bleeding. Pt has used 2 tampons today. LMP 11/13/2013. Pt reports that her menstrual cycle "never comes early."

## 2013-12-12 NOTE — Telephone Encounter (Signed)
Patient came to [facility] hospital with abdominal pain.   She was tested for [facility] and chlamydia (results pending)  Her HIV is positive with Confirmatory assay positive  Tammy, Treena can we get this lady in for intake ASAP  She was in tears as I had to deliver this news to her via phone just as she was getting into cab to drive home from being DC from MAU at [facility] hospital  Her husband had a friend who tested positive and she claims that she and her husband tested negative a year ago.  She will need intake labs including HLA [id number] test and her husband should be tested when they come to clinic

## 2013-12-12 NOTE — Discharge Instructions (Signed)
Your repeat ultrasound showed normal ovaries and tubes and no evidence of ovarian torsion.  Abdominal Pain, Women Abdominal (stomach, pelvic, or belly) pain can be caused by many things. It is important to tell your doctor:  The location of the pain.  Does it come and go or is it present all the time?  Are there things that start the pain (eating certain foods, exercise)?  Are there other symptoms associated with the pain (fever, nausea, vomiting, diarrhea)? All of this is helpful to know when trying to find the cause of the pain. CAUSES   Stomach: virus or bacteria infection, or ulcer.  Intestine: appendicitis (inflamed appendix), regional ileitis (Crohn's disease), ulcerative colitis (inflamed colon), irritable bowel syndrome, diverticulitis (inflamed diverticulum of the colon), or cancer of the stomach or intestine.  Gallbladder disease or stones in the gallbladder.  Kidney disease, kidney stones, or infection.  Pancreas infection or cancer.  Fibromyalgia (pain disorder).  Diseases of the female organs:  Uterus: fibroid (non-cancerous) tumors or infection.  Fallopian tubes: infection or tubal pregnancy.  Ovary: cysts or tumors.  Pelvic adhesions (scar tissue).  Endometriosis (uterus lining tissue growing in the pelvis and on the pelvic organs).  Pelvic congestion syndrome (female organs filling up with blood just before the menstrual period).  Pain with the menstrual period.  Pain with ovulation (producing an egg).  Pain with an IUD (intrauterine device, birth control) in the uterus.  Cancer of the female organs.  Functional pain (pain not caused by a disease, may improve without treatment).  Psychological pain.  Depression. DIAGNOSIS  Your doctor will decide the seriousness of your pain by doing an examination.  Blood tests.  X-rays.  Ultrasound.  CT scan (computed tomography, special type of X-ray).  MRI (magnetic resonance imaging).  Cultures,  for infection.  Barium enema (dye inserted in the large intestine, to better view it with X-rays).  Colonoscopy (looking in intestine with a lighted tube).  Laparoscopy (minor surgery, looking in abdomen with a lighted tube).  Major abdominal exploratory surgery (looking in abdomen with a large incision). TREATMENT  The treatment will depend on the cause of the pain.   Many cases can be observed and treated at home.  Over-the-counter medicines recommended by your caregiver.  Prescription medicine.  Antibiotics, for infection.  Birth control pills, for painful periods or for ovulation pain.  Hormone treatment, for endometriosis.  Nerve blocking injections.  Physical therapy.  Antidepressants.  Counseling with a psychologist or psychiatrist.  Minor or major surgery. HOME CARE INSTRUCTIONS   Do not take laxatives, unless directed by your caregiver.  Take over-the-counter pain medicine only if ordered by your caregiver. Do not take aspirin because it can cause an upset stomach or bleeding.  Try a clear liquid diet (broth or water) as ordered by your caregiver. Slowly move to a bland diet, as tolerated, if the pain is related to the stomach or intestine.  Have a thermometer and take your temperature several times a day, and record it.  Bed rest and sleep, if it helps the pain.  Avoid sexual intercourse, if it causes pain.  Avoid stressful situations.  Keep your follow-up appointments and tests, as your caregiver orders.  If the pain does not go away with medicine or surgery, you may try:  Acupuncture.  Relaxation exercises (yoga, meditation).  Group therapy.  Counseling. SEEK MEDICAL CARE IF:   You notice certain foods cause stomach pain.  Your home care treatment is not helping your pain.  You  need stronger pain medicine.  You want your IUD removed.  You feel faint or lightheaded.  You develop nausea and vomiting.  You develop a rash.  You are  having side effects or an allergy to your medicine. SEEK IMMEDIATE MEDICAL CARE IF:   Your pain does not go away or gets worse.  You have a fever.  Your pain is felt only in portions of the abdomen. The right side could possibly be appendicitis. The left lower portion of the abdomen could be colitis or diverticulitis.  You are passing blood in your stools (bright red or black tarry stools, with or without vomiting).  You have blood in your urine.  You develop chills, with or without a fever.  You pass out. MAKE SURE YOU:   Understand these instructions.  Will watch your condition.  Will get help right away if you are not doing well or get worse. Document Released: 01/27/2007 Document Revised: 08/16/2013 Document Reviewed: 02/16/2009 [facility] Patient Information 2015 [place], [place]. This information is not intended to replace advice given to you by your health care provider. Make sure you discuss any questions you have with your health care provider.  Uterine Fibroid A uterine fibroid is a growth (tumor) that occurs in your uterus. This type of tumor is not cancerous and does not spread out of the uterus. You can have one or many fibroids. Fibroids can vary in size, weight, and where they grow in the uterus. Some can become quite large. Most fibroids do not require medical treatment, but some can cause pain or heavy bleeding during and between periods. CAUSES  A fibroid is the result of a single uterine cell that keeps growing (unregulated), which is different than most cells in the human body. Most cells have a control mechanism that keeps them from reproducing without control.  SIGNS AND SYMPTOMS   Bleeding.  Pelvic pain and pressure.  Bladder problems due to the size of the fibroid.  Infertility and miscarriages depending on the size and location of the fibroid. DIAGNOSIS  Uterine fibroids are diagnosed through a physical exam. Your health care provider may feel the lumpy  tumors during a pelvic exam. Ultrasonography may be done to get information regarding size, location, and number of tumors.  TREATMENT   Your health care provider may recommend watchful waiting. This involves getting the fibroid checked by your health care provider to see if it grows or shrinks.   Hormone treatment or an intrauterine device (IUD) may be prescribed.   Surgery may be needed to remove the fibroids (myomectomy) or the uterus (hysterectomy). This depends on your situation. When fibroids interfere with fertility and a woman wants to become pregnant, a health care provider may recommend having the fibroids removed.  [place] care depends on how you were treated. In general:   Keep all follow-up appointments with your health care provider.   Only take over-the-counter or prescription medicines as directed by your health care provider. If you were prescribed a hormone treatment, take the hormone medicines exactly as directed. Do not take aspirin. It can cause bleeding.   Talk to your health care provider about taking iron pills.  If your periods are troublesome but not so heavy, lie down with your feet raised slightly above your heart. Place cold packs on your lower abdomen.   If your periods are heavy, write down the number of pads or tampons you use per month. Bring this information to your health care provider.  Include green vegetables in your diet.  SEEK IMMEDIATE MEDICAL CARE IF:  You have pelvic pain or cramps not controlled with medicines.   You have a sudden increase in pelvic pain.   You have an increase in bleeding between and during periods.   You have excessive periods and soak tampons or pads in a half hour or less.  You feel lightheaded or have fainting episodes. Document Released: 03/29/2000 Document Revised: 01/20/2013 Document Reviewed: 10/29/2012 [facility] Patient Information 2015 [place], [place]. This information is not  intended to replace advice given to you by your health care provider. Make sure you discuss any questions you have with your health care provider.

## 2013-12-12 NOTE — ED Provider Notes (Signed)
CSN: [id number]     Arrival date & time 12/12/13  1017 History   First MD Initiated Contact with Patient 12/12/13 1046     Chief Complaint  Patient presents with  . Abdominal Pain  . Vaginal Bleeding     (Consider location/radiation/quality/duration/timing/severity/associated sxs/prior Treatment) HPI Comments: Patient is a 38 year old female with history of seizure disorder, asthma, hypertension, GERD, DVT, IVC filter in place who presents to the emergency department today with sudden onset lower abdominal pain and vaginal bleeding. She states that last night it felt like she had gas pains and she brushed off this pain. This morning when she awoke she had severe, sharp pain worse in her left lower abdomen. She had vaginal bleeding. Last menstrual period was 11/13/13. She states her menstrual cycle never comes early and this is abnormal for her. No fevers, chills, nausea, vomiting, diarrhea.   The history is provided by the patient. No language interpreter was used.    Past Medical History  Diagnosis Date  . 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 Seizures     due to head trauma as adult  . Asthma     as child  . Meningioma   . Gallstones     s/p cholecystectomy  . HTN (hypertension)   . GERD (gastroesophageal reflux disease)   . Allergic rhinitis   . Cigarette nicotine dependence with withdrawal   . DVT (deep venous thrombosis)   . Chronic back pain   . Sciatic pain   . Arthritis    Past Surgical History  Procedure Laterality Date  . Brain surgery      2013 to remove a meningioma  . Cholecystectomy     Family History  Problem Relation Age of Onset  . Other Mother     varicose vein  . Asthma Mother   . High blood pressure Mother   . Deep vein thrombosis Neg Hx   . Pulmonary embolism Neg Hx   . Cancer    . Diabetes    . High blood pressure    . Asthma    . Thyroid disease    . Cancer Father    History  Substance Use Topics  . Smoking status: Current Every Day  Smoker -- 0.25 packs/day for 16 years    Types: Cigarettes  . Smokeless tobacco: Never Used  . Alcohol Use: No     Comment: Smokes 3 joints a day   OB History   Grav Para Term Preterm Abortions TAB SAB Ect Mult Living   3 1 1  2 2    1      Review of Systems  Constitutional: Negative for fever and chills.  Respiratory: Negative for shortness of breath.   Cardiovascular: Negative for chest pain.  Gastrointestinal: Positive for abdominal pain. Negative for nausea and vomiting.  Genitourinary: Positive for vaginal bleeding, menstrual problem and pelvic pain. Negative for dysuria.  All other systems reviewed and are negative.     Allergies  Ibuprofen; Clindamycin/lincomycin; Coconut flavor; Flagyl; Latex; Other; and Aspirin  Home Medications   Prior to Admission medications   Medication Sig Start Date End Date Taking? Authorizing Provider  acetaminophen (TYLENOL) 500 MG tablet Take 1,000 mg by mouth every 6 (six) hours as needed for moderate pain or headache.    Historical Provider, MD  albuterol (PROVENTIL HFA;VENTOLIN HFA) 108 (90 BASE) MCG/ACT inhaler Inhale 2 puffs into the lungs every 4 (four) hours as needed for wheezing or shortness  of breath.     Historical Provider, MD  beclomethasone (QVAR) 40 MCG/ACT inhaler Inhale 2 puffs into the lungs 2 (two) times daily.     Historical Provider, MD  benzocaine (HURRICAINE) 20 % oral spray Apply 1 application topically 3 (three) times daily as needed for pain.    Historical Provider, MD  cyclobenzaprine (FLEXERIL) 10 MG tablet Take 1 tablet (10 mg total) by mouth 2 (two) times daily as needed for muscle spasms. 02/01/13   Larence Penning, MD  diazepam (VALIUM) 10 MG tablet Take 10 mg by mouth every 6 (six) hours as needed for anxiety.    Historical Provider, MD  divalproex (DEPAKOTE) 500 MG DR tablet Take 1 tablet (500 mg total) by mouth 3 (three) times daily. 07/09/13   Harvie Heck, PA-C  fluticasone (FLONASE) 50 MCG/ACT nasal spray Place 1  spray into the nose 2 (two) times daily.    Historical Provider, MD  folic acid (FOLVITE) 1 MG tablet Take 1 mg by mouth daily.    Historical Provider, MD  gabapentin (NEURONTIN) 400 MG capsule Take 800 mg by mouth 2 (two) times daily.    Historical Provider, MD  hydrochlorothiazide (HYDRODIURIL) 25 MG tablet Take 1 tablet (25 mg total) by mouth daily. 07/09/13   Harvie Heck, PA-C  levETIRAcetam (KEPPRA) 1000 MG tablet Take 1 tablet (1,000 mg total) by mouth 2 (two) times daily. 07/09/13   Harvie Heck, PA-C  mirtazapine (REMERON) 15 MG tablet Take 15 mg by mouth at bedtime.    Historical Provider, MD  omeprazole (PRILOSEC) 20 MG capsule Take 20 mg by mouth 2 (two) times daily.    Historical Provider, MD  oxyCODONE-acetaminophen (PERCOCET) 10-325 MG per tablet Take 1 tablet by mouth every 4 (four) hours as needed for pain.    Historical Provider, MD  PARoxetine (PAXIL) 40 MG tablet Take 1 tablet (40 mg total) by mouth daily. 07/09/13   Harvie Heck, PA-C  promethazine (PHENERGAN) 6.25 MG/5ML syrup Take 12.5 mg by mouth every 6 (six) hours as needed for nausea or vomiting.    Historical Provider, MD  propranolol (INDERAL) 20 MG tablet Take 1 tablet (20 mg total) by mouth 2 (two) times daily. 07/09/13   Harvie Heck, PA-C  sodium chloride (OCEAN) 0.65 % nasal spray Place 1 spray into the nose 2 (two) times daily as needed for congestion.     Historical Provider, MD  triamcinolone ointment (KENALOG) 0.5 % Apply 1 application topically 2 (two) times daily.    Historical Provider, MD   BP 132/91  Pulse 81  Temp(Src) 98.8 F (37.1 C) (Oral)  Resp 22  Ht 5\' 5"  (1.651 m)  Wt 230 lb (104.327 kg)  BMI 38.27 kg/m2  SpO2 100%  LMP 11/13/2013 Physical Exam  Nursing note and vitals reviewed. Constitutional: She is oriented to person, place, and time. She appears well-developed and well-nourished. No distress.  obese  HENT:  Head: Normocephalic and atraumatic.  Right Ear: External ear normal.  Left  Ear: External ear normal.  Nose: Nose normal.  Mouth/Throat: Oropharynx is clear and moist.  Eyes: Conjunctivae are normal.  Neck: Normal range of motion.  Cardiovascular: Normal rate, regular rhythm and normal heart sounds.   Pulmonary/Chest: Effort normal and breath sounds normal. No stridor. No respiratory distress. She has no wheezes. She has no rales.  Abdominal: Soft. She exhibits no distension. There is tenderness in the suprapubic area and left lower quadrant. There is no rigidity, no rebound and no guarding.  Genitourinary:  There is no rash, tenderness or lesion on the right labia. There is no rash, tenderness or lesion on the left labia. Uterus is tender. Cervix exhibits discharge (blood). Cervix exhibits no motion tenderness and no friability. Right adnexum displays no mass, no tenderness and no fullness. Left adnexum displays tenderness. Left adnexum displays no mass and no fullness. No erythema, tenderness or bleeding around the vagina. No foreign body around the vagina. No signs of injury around the vagina. No vaginal discharge found.  Blood coming from cervical os. TTP over left adnexa and suprapubicly. No blood clots seen on exam.   Musculoskeletal: Normal range of motion.  Neurological: She is alert and oriented to person, place, and time. She has normal strength.  Skin: Skin is warm and dry. She is not diaphoretic. No erythema.  Psychiatric: She has a normal mood and affect. Her behavior is normal.    ED Course  Procedures (including critical care time) Labs Review Labs Reviewed  WET PREP, GENITAL - Abnormal; Notable for the following:    WBC, Wet Prep HPF POC FEW (*)    All other components within normal limits  URINALYSIS, ROUTINE W REFLEX MICROSCOPIC - Abnormal; Notable for the following:    APPearance CLOUDY (*)    Hgb urine dipstick LARGE (*)    Ketones, ur 15 (*)    All other components within normal limits  CBC WITH DIFFERENTIAL - Abnormal; Notable for the  following:    Lymphocytes Relative 47 (*)    All other components within normal limits  COMPREHENSIVE METABOLIC PANEL - Abnormal; Notable for the following:    Albumin 3.4 (*)    All other components within normal limits  URINE MICROSCOPIC-ADD ON - Abnormal; Notable for the following:    Squamous Epithelial / LPF MANY (*)    All other components within normal limits  GC/CHLAMYDIA PROBE AMP  HIV ANTIBODY (ROUTINE TESTING)  POC URINE PREG, ED    Imaging Review US Transvaginal Non-ob  12/12/2013   CLINICAL DATA:  Vaginal bleeding and pain  EXAM: TRANSABDOMINAL AND TRANSVAGINAL ULTRASOUND OF PELVIS  DOPPLER ULTRASOUND OF OVARIES  TECHNIQUE: Both transabdominal and transvaginal ultrasound examinations of the pelvis were performed. Transabdominal technique was performed for global imaging of the pelvis including uterus, ovaries, adnexal regions, and pelvic cul-de-sac.  It was necessary to proceed with endovaginal exam following the transabdominal exam to visualize the ovaries. Color and duplex Doppler ultrasound was utilized to evaluate blood flow to the ovaries.  COMPARISON:  None.  FINDINGS: Uterus  Measurements: 6.9 x 5.1 x 5.4 cm. Fibroid change is noted. The largest of these measures 4.0 cm in greatest dimension.  Endometrium  Thickness: 6.8 mm.  No focal abnormality visualized.  Right ovary  Measurements: 2.6 x 1.7 x 2.1 cm. Normal appearance/no adnexal mass.  Left ovary  Measurements: 2.2 x 1.8 x 2.2 cm. Normal appearance/no adnexal mass.  Pulsed Doppler evaluation of both ovaries demonstrates normal low resistant arterial venous flow within the right ovary. Somewhat dampened waveforms are noted within the left ovary.  Other findings  No free fluid.  IMPRESSION: Slight decrease in the arterial venous waveforms within the left ovary although a definitive torsion cannot be diagnosed. Correlation with the patient's clinical history is recommended.  Fibroid disease   Electronically Signed   By: Inez Catalina M.D.   On: 12/12/2013 14:36   US Pelvis Complete  12/12/2013   CLINICAL DATA:  Vaginal bleeding and pain  EXAM: TRANSABDOMINAL AND TRANSVAGINAL ULTRASOUND OF  PELVIS  DOPPLER ULTRASOUND OF OVARIES  TECHNIQUE: Both transabdominal and transvaginal ultrasound examinations of the pelvis were performed. Transabdominal technique was performed for global imaging of the pelvis including uterus, ovaries, adnexal regions, and pelvic cul-de-sac.  It was necessary to proceed with endovaginal exam following the transabdominal exam to visualize the ovaries. Color and duplex Doppler ultrasound was utilized to evaluate blood flow to the ovaries.  COMPARISON:  None.  FINDINGS: Uterus  Measurements: 6.9 x 5.1 x 5.4 cm. Fibroid change is noted. The largest of these measures 4.0 cm in greatest dimension.  Endometrium  Thickness: 6.8 mm.  No focal abnormality visualized.  Right ovary  Measurements: 2.6 x 1.7 x 2.1 cm. Normal appearance/no adnexal mass.  Left ovary  Measurements: 2.2 x 1.8 x 2.2 cm. Normal appearance/no adnexal mass.  Pulsed Doppler evaluation of both ovaries demonstrates normal low resistant arterial venous flow within the right ovary. Somewhat dampened waveforms are noted within the left ovary.  Other findings  No free fluid.  IMPRESSION: Slight decrease in the arterial venous waveforms within the left ovary although a definitive torsion cannot be diagnosed. Correlation with the patient's clinical history is recommended.  Fibroid disease   Electronically Signed   By: Inez Catalina M.D.   On: 12/12/2013 14:36   [place] Art/ven Flow Abd Pelv Doppler  12/12/2013   CLINICAL DATA:  Vaginal bleeding and pain  EXAM: TRANSABDOMINAL AND TRANSVAGINAL ULTRASOUND OF PELVIS  DOPPLER ULTRASOUND OF OVARIES  TECHNIQUE: Both transabdominal and transvaginal ultrasound examinations of the pelvis were performed. Transabdominal technique was performed for global imaging of the pelvis including uterus, ovaries, adnexal regions, and  pelvic cul-de-sac.  It was necessary to proceed with endovaginal exam following the transabdominal exam to visualize the ovaries. Color and duplex Doppler ultrasound was utilized to evaluate blood flow to the ovaries.  COMPARISON:  None.  FINDINGS: Uterus  Measurements: 6.9 x 5.1 x 5.4 cm. Fibroid change is noted. The largest of these measures 4.0 cm in greatest dimension.  Endometrium  Thickness: 6.8 mm.  No focal abnormality visualized.  Right ovary  Measurements: 2.6 x 1.7 x 2.1 cm. Normal appearance/no adnexal mass.  Left ovary  Measurements: 2.2 x 1.8 x 2.2 cm. Normal appearance/no adnexal mass.  Pulsed Doppler evaluation of both ovaries demonstrates normal low resistant arterial venous flow within the right ovary. Somewhat dampened waveforms are noted within the left ovary.  Other findings  No free fluid.  IMPRESSION: Slight decrease in the arterial venous waveforms within the left ovary although a definitive torsion cannot be diagnosed. Correlation with the patient's clinical history is recommended.  Fibroid disease   Electronically Signed   By: Inez Catalina M.D.   On: 12/12/2013 14:36     EKG Interpretation None      MDM   Final diagnoses:  LLQ abdominal pain  Uterine leiomyoma, unspecified location  Human immunodeficiency virus (HIV) positive    Patient presents to ED with sudden onset LLQ and suprapubic pain with vaginal bleeding. [place] raises question of ovarian torsion on left side. Patient was transferred to [facility] for further evaluation for this reason. Labs are unremarkable. HIV pending. Patient is hemodynamically stable for transfer. Patient / Family / Caregiver informed of clinical course, understand medical decision-making process, and agree with plan.     Elwyn Lade, PA-C 12/13/13 1815

## 2013-12-12 NOTE — MAU Note (Signed)
Pt states feeling itchy post ultrasound. Requests drink.

## 2013-12-12 NOTE — Progress Notes (Signed)
Received call from Dr. Tommy Medal, ID immediately before pt about to leave MAU informing CNM that Pt's HIV testing from [facility] ED today was positive. Dr Tommy Medal will talk to pt on phone and arrange F/U. Pt's appeared shaken, but appropriate. No further questions directed to CNM. Left in taxi.

## 2013-12-12 NOTE — Progress Notes (Signed)
ED CM was requested  by [facility] RN on Pod D to speak with patient at patient's request.  Patient has had 7 ED visits in the past 6 months. PMH Seizures, hx of DVT with IVC filter, Meningioma, GERDS. Presents with c/o abdominal pains and vaginal discharge.  Patient reports, working with  an attorney concerning care she received in the ED at [facility] back in 2014.  She questioned  if SW/CM could provide her some information on the status of the case. ED CM advised patient to contact her legal presentation for any updates, patient states,she has the contact information she is agreeable. She verbalized understanding teach back done. No further ED CM needs identified

## 2013-12-13 ENCOUNTER — Telehealth: Payer: Self-pay

## 2013-12-13 NOTE — Telephone Encounter (Signed)
Voice mail states: This is Mrs. Lori Ford.   Message left on patients voicemail:  I am calling to speak with up as a follow up to your discussion with Dr Tommy Medal on Sunday night.  Please return call so we can arrange a visit this week.  Phone number [phone] and speak with Orland Mustard.   Laverle Patter, RN

## 2013-12-14 LAB — GC/CHLAMYDIA PROBE AMP
CT Probe RNA: NEGATIVE
GC Probe RNA: NEGATIVE

## 2013-12-15 ENCOUNTER — Telehealth: Payer: Self-pay

## 2013-12-15 NOTE — Telephone Encounter (Signed)
Finally was able to speak with patient.  She was given new intake appointment and requested a reminder. She refused to accept the address at this time.   Laverle Patter, RN   If she is a no show for intake I will make a Photographer referral.

## 2013-12-17 NOTE — ED Provider Notes (Signed)
Medical screening examination/treatment/procedure(s) were conducted as a shared visit with non-physician practitioner(s) and myself.  I personally evaluated the patient during the encounter.   EKG Interpretation None       PT with abd pain, sudden onset, with some vaginal bleeding. Non peritoneal exam. [place] is equivocal, with some findings suggestive of torsion. Transfer to MAU.  Varney Biles, MD 12/17/13 [phone]

## 2013-12-21 ENCOUNTER — Telehealth: Payer: Self-pay

## 2013-12-21 ENCOUNTER — Ambulatory Visit: Payer: Medicaid Other

## 2013-12-21 NOTE — Telephone Encounter (Deleted)
12-21-13   

## 2013-12-21 NOTE — Telephone Encounter (Signed)
I attempted to call patient this morning  as a reminder for new intake appointment today. Her current number is not is service.  If she does not come for visit . I will make a referral to [facility].   Laverle Patter, RN

## 2014-01-06 ENCOUNTER — Emergency Department (HOSPITAL_COMMUNITY)
Admission: EM | Admit: 2014-01-06 | Discharge: 2014-01-06 | Disposition: A | Discharge status: Home / Self Care | Payer: Medicaid Other | Attending: Emergency Medicine | Admitting: Emergency Medicine

## 2014-01-06 ENCOUNTER — Encounter (HOSPITAL_COMMUNITY): Payer: Self-pay | Admitting: Emergency Medicine

## 2014-01-06 DIAGNOSIS — F419 Anxiety disorder, unspecified: Secondary | ICD-10-CM

## 2014-01-06 DIAGNOSIS — F17200 Nicotine dependence, unspecified, uncomplicated: Secondary | ICD-10-CM | POA: Insufficient documentation

## 2014-01-06 DIAGNOSIS — G40909 Epilepsy, unspecified, not intractable, without status epilepticus: Secondary | ICD-10-CM | POA: Diagnosis not present

## 2014-01-06 DIAGNOSIS — I10 Essential (primary) hypertension: Secondary | ICD-10-CM | POA: Insufficient documentation

## 2014-01-06 DIAGNOSIS — G8929 Other chronic pain: Secondary | ICD-10-CM | POA: Diagnosis not present

## 2014-01-06 DIAGNOSIS — R569 Unspecified convulsions: Secondary | ICD-10-CM

## 2014-01-06 DIAGNOSIS — Z8719 Personal history of other diseases of the digestive system: Secondary | ICD-10-CM | POA: Diagnosis not present

## 2014-01-06 DIAGNOSIS — Z86718 Personal history of other venous thrombosis and embolism: Secondary | ICD-10-CM | POA: Insufficient documentation

## 2014-01-06 DIAGNOSIS — J45909 Unspecified asthma, uncomplicated: Secondary | ICD-10-CM | POA: Diagnosis not present

## 2014-01-06 DIAGNOSIS — M129 Arthropathy, unspecified: Secondary | ICD-10-CM | POA: Insufficient documentation

## 2014-01-06 LAB — URINALYSIS, ROUTINE W REFLEX MICROSCOPIC
GLUCOSE, UA: NEGATIVE mg/dL
KETONES UR: 40 mg/dL — AB
Leukocytes, UA: NEGATIVE
Nitrite: NEGATIVE
PH: 6 (ref 5.0–8.0)
Protein, ur: 30 mg/dL — AB
Specific Gravity, Urine: 1.034 — ABNORMAL HIGH (ref 1.005–1.030)
Urobilinogen, UA: 0.2 mg/dL (ref 0.0–1.0)

## 2014-01-06 LAB — URINE MICROSCOPIC-ADD ON

## 2014-01-06 LAB — I-STAT CHEM 8, ED
BUN: 9 mg/dL (ref 6–23)
CALCIUM ION: 0.91 mmol/L — AB (ref 1.12–1.23)
CHLORIDE: 108 meq/L (ref 96–112)
Creatinine, Ser: 0.7 mg/dL (ref 0.50–1.10)
Glucose, Bld: 82 mg/dL (ref 70–99)
HCT: 42 % (ref 36.0–46.0)
Hemoglobin: 14.3 g/dL (ref 12.0–15.0)
Potassium: 3.8 mEq/L (ref 3.7–5.3)
SODIUM: 136 meq/L — AB (ref 137–147)
TCO2: 18 mmol/L (ref 0–100)

## 2014-01-06 LAB — VALPROIC ACID LEVEL: Valproic Acid Lvl: <10 ug/mL — ABNORMAL LOW (ref 50.0–100.0)

## 2014-01-06 MED ORDER — VALPROATE SODIUM 500 MG/5ML IV SOLN
500.0000 mg | Freq: Once | INTRAVENOUS | Status: AC
  Administered 2014-01-06: 500 mg via INTRAVENOUS
  Filled 2014-01-06 (×2): qty 5

## 2014-01-06 MED ORDER — ONDANSETRON HCL 4 MG/2ML IJ SOLN
4.0000 mg | Freq: Once | INTRAMUSCULAR | Status: AC
  Administered 2014-01-06: 4 mg via INTRAVENOUS
  Filled 2014-01-06: qty 2

## 2014-01-06 MED ORDER — OXYCODONE-ACETAMINOPHEN 5-325 MG PO TABS
2.0000 | ORAL_TABLET | Freq: Once | ORAL | Status: AC
  Administered 2014-01-06: 2 via ORAL
  Filled 2014-01-06: qty 2

## 2014-01-06 MED ORDER — SODIUM CHLORIDE 0.9 % IV BOLUS (SEPSIS)
1000.0000 mL | Freq: Once | INTRAVENOUS | Status: AC
  Administered 2014-01-06: 1000 mL via INTRAVENOUS

## 2014-01-06 MED ORDER — DIVALPROEX SODIUM 500 MG PO DR TAB: 500.0000 mg | DELAYED_RELEASE_TABLET | Freq: Three times a day (TID) | ORAL | Status: DC

## 2014-01-06 NOTE — ED Notes (Signed)
Pt very rude as soon as i walked into the room.  Would barely speak unless it was telling me how i only have one chance to get blood from her and how to do my job. Anything she said to me came with an attitude.  Got enough for a chem 8 - RN and Dr Sabra Heck both aware.  Pt refusing anymore lab work.

## 2014-01-06 NOTE — ED Notes (Addendum)
Pt states "They racist as shit! I've got a lawsuit against this hospital.  If I was white, this would not be happening."

## 2014-01-06 NOTE — Progress Notes (Signed)
  CARE MANAGEMENT ED NOTE 01/06/2014  Patient:  Lori Ford, Lori Ford   Account Number:  [id number]  Date Initiated:  01/06/2014  Documentation initiated by:  Jackelyn Poling  Subjective/Objective Assessment:   38 yr old [place] access [employer] pt had witnessed seizures at [employer]. Hx of epilepsy, takes depakote and dilantin for seizures, has been out of dilantin for past 2 days. During first seizure friends lowered pt to     Subjective/Objective Assessment Detail:   the floor  Pt began to inform CM she "do not feel I get treated right when I come to this hospital and I believe it has to do with the law suit I have against them"  Pt states she is "not having trouble with getting my medicines. I had not picked up my Dilantin." Denied any financial concerns or issues with pharmacy  Pt tearful during assessment. Tissue and encouragement offered to pt. States she "has a lot going on with my anxiety" " Dr Jimmye Norman helps me with my medicine." Confirmed Dwight williams as pcp Denies having psychiatrist, counselor, etc  Agree to chaplain service visit     Action/Plan:   ED CM noted pt with [facility] ED visits and RN note stating pt had been out of dilantin for past 2 days. Spoke with pt about concerns with meds CM redirected pt back to med concerns when she began to speak about a law suit.  CM assessed med   Action/Plan Detail:   assistance needs Pt confirmed with CM she could get her meds and have access to resources Encouraged her to speak with pcp about anxiety & possible counseling therapy called chaplain, Matt RN updated   Anticipated DC Date:       Status Recommendation to Physician:   Result of Recommendation:    Other ED [place]  Other  PCP issues  Outpatient Services - Pt will follow up    Choice offered to / List presented to:            Status of service:  Completed, signed off  ED Comments:   ED Comments Detail:

## 2014-01-06 NOTE — ED Provider Notes (Signed)
CSN: [id number]     Arrival date & time 01/06/14  1221 History   First MD Initiated Contact with Patient 01/06/14 1223     Chief Complaint  Patient presents with  . Seizures     (Consider location/radiation/quality/duration/timing/severity/associated sxs/prior Treatment) HPI Comments: 38 year old female, history of seizure disorder and a history of anxiety who presents by paramedic transport after she was found to have seizure-like activity at her house. She was recently told that she was going to be evicted from her house for not paying her bills, she has not taken her Valium in 3 days because she is out of her medication, she is taking her Depakote and her Keppra. She states that she then had a seizure. When the paramedics arrived she was face down on her left side, she was unresponsive, they gave her Versed and within 3 seconds she was awake and talking. There was no other postictal period, no tongue biting, no urinary incontinence and the paramedics stated that the seizure-like activity was not rhythmic but more writing. The patient is very tearful when she describes her level of anxiety at this time.  Patient is a 38 y.o. female presenting with seizures. The history is provided by the patient and the EMS personnel.  Seizures   Past Medical History  Diagnosis Date  . 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 Seizures     due to head trauma as adult  . Asthma     as child  . Meningioma   . Gallstones     s/p cholecystectomy  . HTN (hypertension)   . GERD (gastroesophageal reflux disease)   . Allergic rhinitis   . Cigarette nicotine dependence with withdrawal   . DVT (deep venous thrombosis)   . Chronic back pain   . Sciatic pain   . Arthritis    Past Surgical History  Procedure Laterality Date  . Brain surgery      2013 to remove a meningioma  . Cholecystectomy     Family History  Problem Relation Age of Onset  . Other Mother     varicose vein  . Asthma Mother   .  High blood pressure Mother   . Deep vein thrombosis Neg Hx   . Pulmonary embolism Neg Hx   . Cancer    . Diabetes    . High blood pressure    . Asthma    . Thyroid disease    . Cancer Father    History  Substance Use Topics  . Smoking status: Current Every Day Smoker -- 0.25 packs/day for 16 years    Types: Cigarettes  . Smokeless tobacco: Never Used  . Alcohol Use: No     Comment: Smokes 3 joints a day   OB History   Grav Para Term Preterm Abortions TAB SAB Ect Mult Living   3 1 1  2 2    1      Review of Systems  Neurological: Positive for seizures.  All other systems reviewed and are negative.     Allergies  Ibuprofen; Clindamycin/lincomycin; Coconut flavor; Flagyl; Latex; Other; and Aspirin  Home Medications   Prior to Admission medications   Medication Sig Start Date End Date Taking? Authorizing Provider  acetaminophen (TYLENOL) 500 MG tablet Take 1,000 mg by mouth every 6 (six) hours as needed for moderate pain or headache.   Yes Historical Provider, MD  albuterol (PROVENTIL HFA;VENTOLIN HFA) 108 (90 BASE) MCG/ACT inhaler Inhale 2 puffs into  the lungs every 4 (four) hours as needed for wheezing or shortness of breath.    Yes Historical Provider, MD  albuterol (PROVENTIL) (2.5 MG/3ML) 0.083% nebulizer solution Take 2.5 mg by nebulization every 6 (six) hours as needed for wheezing or shortness of breath.   Yes Historical Provider, MD  beclomethasone (QVAR) 40 MCG/ACT inhaler Inhale 2 puffs into the lungs 2 (two) times daily.    Yes Historical Provider, MD  benzocaine (HURRICAINE) 20 % oral spray Apply 1 application topically 3 (three) times daily as needed for pain.   Yes Historical Provider, MD  cyclobenzaprine (FLEXERIL) 10 MG tablet Take 1 tablet (10 mg total) by mouth 2 (two) times daily as needed for muscle spasms. 02/01/13  Yes Larence Penning, MD  diazepam (VALIUM) 10 MG tablet Take 10 mg by mouth every 6 (six) hours as needed for anxiety.   Yes Historical Provider,  MD  divalproex (DEPAKOTE) 500 MG DR tablet Take 1 tablet (500 mg total) by mouth 3 (three) times daily. 07/09/13  Yes Harvie Heck, PA-C  fluticasone (FLONASE) 50 MCG/ACT nasal spray Place 1 spray into the nose 2 (two) times daily.   Yes Historical Provider, MD  folic acid (FOLVITE) 1 MG tablet Take 1 mg by mouth daily.   Yes Historical Provider, MD  gabapentin (NEURONTIN) 400 MG capsule Take 800 mg by mouth 3 (three) times daily.    Yes Historical Provider, MD  hydrochlorothiazide (HYDRODIURIL) 25 MG tablet Take 1 tablet (25 mg total) by mouth daily. 07/09/13  Yes Harvie Heck, PA-C  levETIRAcetam (KEPPRA) 1000 MG tablet Take 1 tablet (1,000 mg total) by mouth 2 (two) times daily. 07/09/13  Yes Harvie Heck, PA-C  mirtazapine (REMERON) 15 MG tablet Take 15 mg by mouth at bedtime.   Yes Historical Provider, MD  omeprazole (PRILOSEC) 20 MG capsule Take 20 mg by mouth 2 (two) times daily.   Yes Historical Provider, MD  oxyCODONE-acetaminophen (PERCOCET) 10-325 MG per tablet Take 1 tablet by mouth every 4 (four) hours as needed for pain.   Yes Historical Provider, MD  PARoxetine (PAXIL) 40 MG tablet Take 1 tablet (40 mg total) by mouth daily. 07/09/13  Yes Harvie Heck, PA-C  promethazine (PHENERGAN) 6.25 MG/5ML syrup Take 12.5 mg by mouth every 6 (six) hours as needed for nausea or vomiting.   Yes Historical Provider, MD  propranolol (INDERAL) 20 MG tablet Take 1 tablet (20 mg total) by mouth 2 (two) times daily. 07/09/13  Yes Harvie Heck, PA-C  sodium chloride (OCEAN) 0.65 % nasal spray Place 1 spray into the nose 2 (two) times daily as needed for congestion.    Yes Historical Provider, MD  triamcinolone ointment (KENALOG) 0.5 % Apply 1 application topically 2 (two) times daily.   Yes Historical Provider, MD  divalproex (DEPAKOTE) 500 MG DR tablet Take 1 tablet (500 mg total) by mouth 3 (three) times daily. 01/06/14   Johnna Acosta, MD   BP 139/103  Pulse 85  Temp(Src) 98.3 F (36.8 C) (Oral)   Resp 15  SpO2 100% Physical Exam  Nursing note and vitals reviewed. Constitutional: She appears well-developed and well-nourished. No distress.  HENT:  Head: Normocephalic and atraumatic.  Mouth/Throat: Oropharynx is clear and moist. No oropharyngeal exudate.  Eyes: Conjunctivae and EOM are normal. Pupils are equal, round, and reactive to light. Right eye exhibits no discharge. Left eye exhibits no discharge. No scleral icterus.  Neck: Normal range of motion. Neck supple. No JVD present. No thyromegaly present.  Cardiovascular:  Normal rate, regular rhythm, normal heart sounds and intact distal pulses.  Exam reveals no gallop and no friction rub.   No murmur heard. Pulmonary/Chest: Effort normal and breath sounds normal. No respiratory distress. She has no wheezes. She has no rales.  Abdominal: Soft. Bowel sounds are normal. She exhibits no distension and no mass. There is no tenderness.  Musculoskeletal: Normal range of motion. She exhibits no edema and no tenderness.  Lymphadenopathy:    She has no cervical adenopathy.  Neurological: She is alert. Coordination normal.  Neurologic exam:  Speech clear, pupils equal round reactive to light, extraocular movements intact  Normal peripheral visual fields Cranial nerves III through XII normal including no facial droop Follows commands, moves all extremities x4, normal strength to bilateral upper and lower extremities at all major muscle groups including grip Sensation normal to light touch and pinprick Coordination intact, no limb ataxia, finger-nose-finger normal Rapid alternating movements normal No pronator drift Gait normal   Skin: Skin is warm and dry. No rash noted. No erythema.  Psychiatric: She has a normal mood and affect. Her behavior is normal.  Tearful    ED Course  Procedures (including critical care time) Labs Review Labs Reviewed  VALPROIC ACID LEVEL - Abnormal; Notable for the following:    Valproic Acid Lvl <10.0  (*)    All other components within normal limits  URINALYSIS, ROUTINE W REFLEX MICROSCOPIC - Abnormal; Notable for the following:    Specific Gravity, Urine 1.034 (*)    Hgb urine dipstick MODERATE (*)    Bilirubin Urine SMALL (*)    Ketones, ur 40 (*)    Protein, ur 30 (*)    All other components within normal limits  URINE MICROSCOPIC-ADD ON - Abnormal; Notable for the following:    Squamous Epithelial / LPF FEW (*)    All other components within normal limits  I-STAT CHEM 8, ED - Abnormal; Notable for the following:    Sodium 136 (*)    Calcium, Ion 0.91 (*)    All other components within normal limits    Imaging Review No results found.    MDM   Final diagnoses:  Seizure-like activity  Anxiety    At this time the patient appears tearful and anxious, there is no seizure activity and she has a normal mental status. Check Depakote level, patient states that she has been very compliant with his medications. Would consider benzodiazepine withdrawal, possibly anxiety, historical E. based on the seizure it sounds like it may be more anxiety and psychiatric-related.  The Depakote level less than 10, patient has had no further seizures, she continues to be tearful though not suicidal, she has several children stay with a family member, she wants to be with them, her husband also lives with her and works locally, at [employer] and has been in to talk to the patient, she does not want to go to a shelter, she will be discharged to go be with her children who are currently staying with a sister.  Meds given in ED:  Medications  valproate (DEPACON) 500 mg in dextrose 5 % 50 mL IVPB (not administered)  oxyCODONE-acetaminophen (PERCOCET/ROXICET) 5-325 MG per tablet 2 tablet (2 tablets Oral Given 01/06/14 1436)  ondansetron (ZOFRAN) injection 4 mg (4 mg Intravenous Given 01/06/14 1525)  sodium chloride 0.9 % bolus 1,000 mL (1,000 mLs Intravenous New Bag/Given 01/06/14 1543)    New  Prescriptions   DIVALPROEX (DEPAKOTE) 500 MG DR TABLET    Take 1  tablet (500 mg total) by mouth 3 (three) times daily.      Johnna Acosta, MD 01/06/14 [phone]

## 2014-01-06 NOTE — ED Notes (Signed)
Pt had witnessed seizures at friend's house. Hx of epilepsy, takes depakote and dilantin for seizures, has been out of dilantin for past 2 days. During first seizure friends lowered pt to the floor. When fire department arrived, pt was still seizing. Pt stopped seizing prior to EMS arrival, but began again. EMS gave 5mg  versed IM, which resolved seizure. Pt reports she has been under stress today, which can be a trigger for her seizures. Pt alert and oriented upon EMS arrival to ED, slightly tired. Complains of generalized pain 10/10. No injury to tongue. Urinary incontinence during seizure. Pt reports last seizure "a couple of weeks ago."
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
## 2014-01-06 NOTE — ED Notes (Addendum)
Pt pressed call bell, when writer went in to find out what the pt needed, pt began to state, "I know how [facility] treats black ppl,  That's why I have a lawsuit against this hospital.  The staff may take it but I am not. If I was white I would not have been sitting in this room for the past two hours with no pain meds.  Donnald Garre been stuck 8 times and can't no one get any blood and this white doctor wont give me medicine til I urinate. What's that about." "my husband wants to know why I'm here and Im in pain."  Writer informed pt, that blood work was essential so the doc would be able to better know what direction of treatment he should take with you and the urine is for the same reasons.  Writer also informed pt she would have EDP Sabra Heck, come to her room to discuss his plan of care with her. RN and Agricultural consultant notified.
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
## 2014-01-06 NOTE — ED Notes (Signed)
Pt was able to urinate but did not give sample.  Will obtain sample when pt goes to restroom again.

## 2014-01-06 NOTE — ED Notes (Signed)
MD at bedside. 

## 2014-01-06 NOTE — Discharge Instructions (Signed)
Please call your doctor for a followup appointment within 24-48 hours. When you talk to your doctor please let them know that you were seen in the emergency department and have them acquire all of your records so that they can discuss the findings with you and formulate a treatment plan to fully care for your new and ongoing problems. ° °

## 2014-01-06 NOTE — Progress Notes (Signed)
01/06/2014 A. Robby Bulkley RNCM 1637pm EDCM spoke to patient at bedside.  EDSW currently at bedside speaking with patient.  EDCM provided patient with printed list of [place] assistance programs to help support patient's financial issues.

## 2014-01-06 NOTE — ED Notes (Signed)
Bed: VE72 Expected date:  Expected time:  Means of arrival:  Comments: ems-38 yo F, seizure,

## 2014-02-14 ENCOUNTER — Encounter (HOSPITAL_COMMUNITY): Payer: Self-pay | Admitting: Emergency Medicine

## 2014-03-29 ENCOUNTER — Emergency Department (HOSPITAL_COMMUNITY)
Admission: EM | Admit: 2014-03-29 | Discharge: 2014-03-29 | Disposition: A | Discharge status: Home / Self Care | Payer: Medicaid Other | Attending: Emergency Medicine | Admitting: Emergency Medicine

## 2014-03-29 ENCOUNTER — Encounter (HOSPITAL_COMMUNITY): Payer: Self-pay | Admitting: Physical Medicine and Rehabilitation

## 2014-03-29 ENCOUNTER — Emergency Department (HOSPITAL_COMMUNITY): Payer: Medicaid Other

## 2014-03-29 DIAGNOSIS — K219 Gastro-esophageal reflux disease without esophagitis: Secondary | ICD-10-CM | POA: Insufficient documentation

## 2014-03-29 DIAGNOSIS — R0982 Postnasal drip: Secondary | ICD-10-CM | POA: Insufficient documentation

## 2014-03-29 DIAGNOSIS — R200 Anesthesia of skin: Secondary | ICD-10-CM | POA: Diagnosis not present

## 2014-03-29 DIAGNOSIS — Z720 Tobacco use: Secondary | ICD-10-CM | POA: Insufficient documentation

## 2014-03-29 DIAGNOSIS — Z79899 Other long term (current) drug therapy: Secondary | ICD-10-CM | POA: Insufficient documentation

## 2014-03-29 DIAGNOSIS — G40409 Other generalized epilepsy and epileptic syndromes, not intractable, without status epilepticus: Secondary | ICD-10-CM | POA: Insufficient documentation

## 2014-03-29 DIAGNOSIS — I10 Essential (primary) hypertension: Secondary | ICD-10-CM | POA: Diagnosis not present

## 2014-03-29 DIAGNOSIS — R52 Pain, unspecified: Secondary | ICD-10-CM | POA: Diagnosis present

## 2014-03-29 DIAGNOSIS — M1990 Unspecified osteoarthritis, unspecified site: Secondary | ICD-10-CM | POA: Insufficient documentation

## 2014-03-29 DIAGNOSIS — Z7951 Long term (current) use of inhaled steroids: Secondary | ICD-10-CM | POA: Diagnosis not present

## 2014-03-29 DIAGNOSIS — Z91040 Latex allergy status: Secondary | ICD-10-CM | POA: Diagnosis not present

## 2014-03-29 DIAGNOSIS — M549 Dorsalgia, unspecified: Secondary | ICD-10-CM | POA: Diagnosis not present

## 2014-03-29 DIAGNOSIS — R202 Paresthesia of skin: Secondary | ICD-10-CM | POA: Diagnosis not present

## 2014-03-29 DIAGNOSIS — Z7952 Long term (current) use of systemic steroids: Secondary | ICD-10-CM | POA: Diagnosis not present

## 2014-03-29 DIAGNOSIS — J45909 Unspecified asthma, uncomplicated: Secondary | ICD-10-CM | POA: Insufficient documentation

## 2014-03-29 DIAGNOSIS — Z86011 Personal history of benign neoplasm of the brain: Secondary | ICD-10-CM | POA: Diagnosis not present

## 2014-03-29 DIAGNOSIS — R51 Headache: Secondary | ICD-10-CM | POA: Diagnosis not present

## 2014-03-29 DIAGNOSIS — R079 Chest pain, unspecified: Secondary | ICD-10-CM

## 2014-03-29 DIAGNOSIS — G8929 Other chronic pain: Secondary | ICD-10-CM | POA: Insufficient documentation

## 2014-03-29 DIAGNOSIS — Z86718 Personal history of other venous thrombosis and embolism: Secondary | ICD-10-CM | POA: Diagnosis not present

## 2014-03-29 LAB — BASIC METABOLIC PANEL
ANION GAP: 13 (ref 5–15)
BUN: 12 mg/dL (ref 6–23)
CO2: 23 meq/L (ref 19–32)
Calcium: 9.1 mg/dL (ref 8.4–10.5)
Chloride: 100 mEq/L (ref 96–112)
Creatinine, Ser: 0.75 mg/dL (ref 0.50–1.10)
GFR calc Af Amer: >90 mL/min (ref >90)
GFR calc non Af Amer: >90 mL/min (ref >90)
Glucose, Bld: 122 mg/dL — ABNORMAL HIGH (ref 70–99)
Potassium: 4 mEq/L (ref 3.7–5.3)
SODIUM: 136 meq/L — AB (ref 137–147)

## 2014-03-29 LAB — CBC WITH DIFFERENTIAL/PLATELET
BASOS ABS: 0 10*3/uL (ref 0.0–0.1)
Basophils Relative: 1 % (ref 0–1)
Eosinophils Absolute: 0 10*3/uL (ref 0.0–0.7)
Eosinophils Relative: 1 % (ref 0–5)
HCT: 39 % (ref 36.0–46.0)
Hemoglobin: 13.2 g/dL (ref 12.0–15.0)
LYMPHS PCT: 44 % (ref 12–46)
Lymphs Abs: 2.7 10*3/uL (ref 0.7–4.0)
MCH: 30.6 pg (ref 26.0–34.0)
MCHC: 33.8 g/dL (ref 30.0–36.0)
MCV: 90.3 fL (ref 78.0–100.0)
Monocytes Absolute: 0.5 10*3/uL (ref 0.1–1.0)
Monocytes Relative: 9 % (ref 3–12)
NEUTROS PCT: 45 % (ref 43–77)
Neutro Abs: 2.9 10*3/uL (ref 1.7–7.7)
PLATELETS: 240 10*3/uL (ref 150–400)
RBC: 4.32 MIL/uL (ref 3.87–5.11)
RDW: 14.3 % (ref 11.5–15.5)
WBC: 6.2 10*3/uL (ref 4.0–10.5)

## 2014-03-29 LAB — I-STAT TROPONIN, ED: TROPONIN I, POC: 0 ng/mL (ref 0.00–0.08)

## 2014-03-29 MED ORDER — KETOROLAC TROMETHAMINE 60 MG/2ML IM SOLN
60.0000 mg | Freq: Once | INTRAMUSCULAR | Status: AC
  Administered 2014-03-29: 60 mg via INTRAMUSCULAR
  Filled 2014-03-29: qty 2

## 2014-03-29 NOTE — ED Notes (Signed)
Pt presents to department for evaluation of L sided chest pain, pain to L side of body and SOB. Ongoing x1 month. 7/10 pain at the time. Pt is alert and oriented x4. NAD.

## 2014-03-29 NOTE — Discharge Instructions (Signed)
General Headache Without Cause A headache is pain or discomfort felt around the head or neck area. The specific cause of a headache may not be found. There are many causes and types of headaches. A few common ones are:  Tension headaches.  Migraine headaches.  Cluster headaches.  Chronic daily headaches. HOME CARE INSTRUCTIONS   Keep all follow-up appointments with your caregiver or any specialist referral.  Only take over-the-counter or prescription medicines for pain or discomfort as directed by your caregiver.  Lie down in a dark, quiet room when you have a headache.  Keep a headache journal to find out what may trigger your migraine headaches. For example, write down:  What you eat and drink.  How much sleep you get.  Any change to your diet or medicines.  Try massage or other relaxation techniques.  Put ice packs or heat on the head and neck. Use these 3 to 4 times per day for 15 to 20 minutes each time, or as needed.  Limit stress.  Sit up straight, and do not tense your muscles.  Quit smoking if you smoke.  Limit alcohol use.  Decrease the amount of caffeine you drink, or stop drinking caffeine.  Eat and sleep on a regular schedule.  Get 7 to 9 hours of sleep, or as recommended by your caregiver.  Keep lights dim if bright lights bother you and make your headaches worse. SEEK MEDICAL CARE IF:   You have problems with the medicines you were prescribed.  Your medicines are not working.  You have a change from the usual headache.  You have nausea or vomiting. SEEK IMMEDIATE MEDICAL CARE IF:   Your headache becomes severe.  You have a fever.  You have a stiff neck.  You have loss of vision.  You have muscular weakness or loss of muscle control.  You start losing your balance or have trouble walking.  You feel faint or pass out.  You have severe symptoms that are different from your first symptoms. MAKE SURE YOU:   Understand these  instructions.  Will watch your condition.  Will get help right away if you are not doing well or get worse. Document Released: 04/01/2005 Document Revised: 06/24/2011 Document Reviewed: 04/17/2011 [facility] Patient Information 2015 [place], [place]. This information is not intended to replace advice given to you by your health care provider. Make sure you discuss any questions you have with your health care provider. Muscle Pain Muscle pain (myalgia) may be caused by many things, including:  Overuse or muscle strain, especially if you are not in shape. This is the most common cause of muscle pain.  Injury.  Bruises.  Viruses, such as the flu.  Infectious diseases.  Fibromyalgia, which is a chronic condition that causes muscle tenderness, fatigue, and headache.  Autoimmune diseases, including lupus.  Certain drugs, including ACE inhibitors and statins. Muscle pain may be mild or severe. In most cases, the pain lasts only a short time and goes away without treatment. To diagnose the cause of your muscle pain, your health care provider will take your medical history. This means he or she will ask you when your muscle pain began and what has been happening. If you have not had muscle pain for very long, your health care provider may want to wait before doing much testing. If your muscle pain has lasted a long time, your health care provider may want to run tests right away. If your health care provider thinks your muscle pain  may be caused by illness, you may need to have additional tests to rule out certain conditions.  Treatment for muscle pain depends on the cause. Home care is often enough to relieve muscle pain. Your health care provider may also prescribe anti-inflammatory medicine. HOME CARE INSTRUCTIONS Watch your condition for any changes. The following actions may help to lessen any discomfort you are feeling:  Only take over-the-counter or prescription medicines as directed by your  health care provider.  Apply ice to the sore muscle:  Put ice in a plastic bag.  Place a towel between your skin and the bag.  Leave the ice on for 15-20 minutes, 3-4 times a day.  You may alternate applying hot and cold packs to the muscle as directed by your health care provider.  If overuse is causing your muscle pain, slow down your activities until the pain goes away.  Remember that it is normal to feel some muscle pain after starting a workout program. Muscles that have not been used often will be sore at first.  Do regular, gentle exercises if you are not usually active.  Warm up before exercising to lower your risk of muscle pain.  Do not continue working out if the pain is very bad. Bad pain could mean you have injured a muscle. SEEK MEDICAL CARE IF:  Your muscle pain gets worse, and medicines do not help.  You have muscle pain that lasts longer than 3 days.  You have a rash or fever along with muscle pain.  You have muscle pain after a tick bite.  You have muscle pain while working out, even though you are in good physical condition.  You have redness, soreness, or swelling along with muscle pain.  You have muscle pain after starting a new medicine or changing the dose of a medicine. SEEK IMMEDIATE MEDICAL CARE IF:  You have trouble breathing.  You have trouble swallowing.  You have muscle pain along with a stiff neck, fever, and vomiting.  You have severe muscle weakness or cannot move part of your body. MAKE SURE YOU:   Understand these instructions.  Will watch your condition.  Will get help right away if you are not doing well or get worse. Document Released: 02/21/2006 Document Revised: 04/06/2013 Document Reviewed: 01/26/2013 [facility] Patient Information 2015 [place], [place]. This information is not intended to replace advice given to you by your health care provider. Make sure you discuss any questions you have with your health care provider.

## 2014-03-29 NOTE — ED Provider Notes (Signed)
CSN: [id number]     Arrival date & time 03/29/14  1053 History   First MD Initiated Contact with Patient 03/29/14 1154     Chief Complaint  Patient presents with  . Headache  . Generalized Body Aches    left sided    Lori Ford is a 38 y.o. female with a history of hypertension, GERD, chronic low back pain, seizures, and brain tumor presents to emergency department complaining of left-sided body pain for the past 1.5 months. Her pain is from the top of her left side of her head and travels all the way into her left foot. Patient states she came in today because her pain is worse and she rates it a 10 out of 10. She reports any movement or anything at all makes her pain worse. Patient also reports a left-sided headache that has been constant for the past 5 months. Patient also reports vomiting twice today, but she was able to tolerate food and drink later. Patient reports she has numbness and tingling in her left upper arm and lower leg intermittently for the past 1.5 months. Patient reports urinary incontinence due to bladder prolapse for the past year. Patient denies loss of bowel control. Patient denies saddle anesthesia. Patient reports she had a meningioma removed from the left side of her brain on 07/12/2011. Patient states that her symptoms for the past month and a half feel similar to when she had this brain tumor. Patient reports she is taking gabapentin, Percocet and Tylenol for her pain today without relief. Contrary to the nurses note, she denies shortness of breath. The patient denies fevers, chills, shortness of breath, cough, wheezing, weakness, blurry vision, changes to her balance, changes to her vision, abdominal pain, saddle anaesthesia, loss of bowel control, leg swelling, or palpitations.   (Consider location/radiation/quality/duration/timing/severity/associated sxs/prior Treatment) HPI  Past Medical History  Diagnosis Date  . 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  Seizures     due to head trauma as adult  . Asthma     as child  . Meningioma   . Gallstones     s/p cholecystectomy  . HTN (hypertension)   . GERD (gastroesophageal reflux disease)   . Allergic rhinitis   . Cigarette nicotine dependence with withdrawal   . DVT (deep venous thrombosis)   . Chronic back pain   . Sciatic pain   . Arthritis    Past Surgical History  Procedure Laterality Date  . Brain surgery      2013 to remove a meningioma  . Cholecystectomy     Family History  Problem Relation Age of Onset  . Other Mother     varicose vein  . Asthma Mother   . High blood pressure Mother   . Deep vein thrombosis Neg Hx   . Pulmonary embolism Neg Hx   . Cancer    . Diabetes    . High blood pressure    . Asthma    . Thyroid disease    . Cancer Father    History  Substance Use Topics  . Smoking status: Current Every Day Smoker -- 0.25 packs/day for 16 years    Types: Cigarettes  . Smokeless tobacco: Never Used  . Alcohol Use: No   OB History    Gravida Para Term Preterm AB TAB SAB Ectopic Multiple Living   3 1 1  2 2    1      Review of Systems  Constitutional:  Negative for fever, chills, appetite change and fatigue.  HENT: Positive for postnasal drip. Negative for congestion, facial swelling, sore throat and trouble swallowing.   Eyes: Negative for pain and visual disturbance.  Respiratory: Negative for cough, shortness of breath and wheezing.   Cardiovascular: Negative for palpitations and leg swelling.  Gastrointestinal: Negative for nausea, vomiting and diarrhea.  Genitourinary: Negative for dysuria, hematuria, vaginal bleeding and vaginal discharge.  Musculoskeletal: Positive for myalgias and back pain. Negative for joint swelling, gait problem and neck stiffness.  Skin: Negative for rash and wound.  Neurological: Positive for numbness and headaches. Negative for dizziness, seizures, syncope, facial asymmetry, speech difficulty, weakness and light-headedness.   All other systems reviewed and are negative.     Allergies  Ibuprofen; Clindamycin/lincomycin; Coconut flavor; Flagyl; Latex; Other; and Aspirin  Home Medications   Prior to Admission medications   Medication Sig Start Date End Date Taking? Authorizing Provider  acetaminophen (TYLENOL) 500 MG tablet Take 1,000 mg by mouth every 6 (six) hours as needed for moderate pain or headache.    Historical Provider, MD  albuterol (PROVENTIL HFA;VENTOLIN HFA) 108 (90 BASE) MCG/ACT inhaler Inhale 2 puffs into the lungs every 4 (four) hours as needed for wheezing or shortness of breath.     Historical Provider, MD  albuterol (PROVENTIL) (2.5 MG/3ML) 0.083% nebulizer solution Take 2.5 mg by nebulization every 6 (six) hours as needed for wheezing or shortness of breath.    Historical Provider, MD  beclomethasone (QVAR) 40 MCG/ACT inhaler Inhale 2 puffs into the lungs 2 (two) times daily.     Historical Provider, MD  benzocaine (HURRICAINE) 20 % oral spray Apply 1 application topically 3 (three) times daily as needed for pain.    Historical Provider, MD  cyclobenzaprine (FLEXERIL) 10 MG tablet Take 1 tablet (10 mg total) by mouth 2 (two) times daily as needed for muscle spasms. 02/01/13   Larence Penning, MD  diazepam (VALIUM) 10 MG tablet Take 10 mg by mouth every 6 (six) hours as needed for anxiety.    Historical Provider, MD  divalproex (DEPAKOTE) 500 MG DR tablet Take 1 tablet (500 mg total) by mouth 3 (three) times daily. 07/09/13   Harvie Heck, PA-C  divalproex (DEPAKOTE) 500 MG DR tablet Take 1 tablet (500 mg total) by mouth 3 (three) times daily. 01/06/14   Johnna Acosta, MD  fluticasone (FLONASE) 50 MCG/ACT nasal spray Place 1 spray into the nose 2 (two) times daily.    Historical Provider, MD  folic acid (FOLVITE) 1 MG tablet Take 1 mg by mouth daily.    Historical Provider, MD  gabapentin (NEURONTIN) 400 MG capsule Take 800 mg by mouth 3 (three) times daily.     Historical Provider, MD   hydrochlorothiazide (HYDRODIURIL) 25 MG tablet Take 1 tablet (25 mg total) by mouth daily. 07/09/13   Harvie Heck, PA-C  levETIRAcetam (KEPPRA) 1000 MG tablet Take 1 tablet (1,000 mg total) by mouth 2 (two) times daily. 07/09/13   Harvie Heck, PA-C  mirtazapine (REMERON) 15 MG tablet Take 15 mg by mouth at bedtime.    Historical Provider, MD  omeprazole (PRILOSEC) 20 MG capsule Take 20 mg by mouth 2 (two) times daily.    Historical Provider, MD  oxyCODONE-acetaminophen (PERCOCET) 10-325 MG per tablet Take 1 tablet by mouth every 4 (four) hours as needed for pain.    Historical Provider, MD  PARoxetine (PAXIL) 40 MG tablet Take 1 tablet (40 mg total) by mouth daily. 07/09/13   Lauren  Parker, PA-C  promethazine (PHENERGAN) 6.25 MG/5ML syrup Take 12.5 mg by mouth every 6 (six) hours as needed for nausea or vomiting.    Historical Provider, MD  propranolol (INDERAL) 20 MG tablet Take 1 tablet (20 mg total) by mouth 2 (two) times daily. 07/09/13   Harvie Heck, PA-C  sodium chloride (OCEAN) 0.65 % nasal spray Place 1 spray into the nose 2 (two) times daily as needed for congestion.     Historical Provider, MD  triamcinolone ointment (KENALOG) 0.5 % Apply 1 application topically 2 (two) times daily.    Historical Provider, MD   BP 115/71 mmHg  Pulse 90  Temp(Src) 99.1 F (37.3 C) (Oral)  Resp 17  Ht 5\' 5"  (1.651 m)  Wt 225 lb (102.059 kg)  BMI 37.44 kg/m2  SpO2 99%  LMP 03/15/2014 Physical Exam  Constitutional: She is oriented to person, place, and time. She appears well-developed and well-nourished. No distress.  The patient is resting comfortably in bed and apprears in no distress or discomfort.   HENT:  Head: Normocephalic and atraumatic.  Right Ear: External ear normal.  Left Ear: External ear normal.  Nose: Nose normal.  Mouth/Throat: Oropharynx is clear and moist. No oropharyngeal exudate.  Bilateral tympanic membranes are pearly-gray without erythema or loss of landmarks. No  temporal tenderness.  Eyes: Conjunctivae and EOM are normal. Pupils are equal, round, and reactive to light. Right eye exhibits no discharge. Left eye exhibits no discharge.  Neck: Normal range of motion. Neck supple.  Cardiovascular: Normal rate, regular rhythm, normal heart sounds and intact distal pulses.  Exam reveals no gallop and no friction rub.   No murmur heard. Bilateral radial pulses are intact. Bilateral posterior tibialis pulses are intact.  Pulmonary/Chest: Effort normal and breath sounds normal. No respiratory distress. She has no wheezes. She has no rales. She exhibits no tenderness.  Abdominal: Soft. Bowel sounds are normal. She exhibits no distension and no mass. There is no tenderness. There is no rebound and no guarding.  Patient's abdomen is soft and nontender to palpation. Bowel sounds are present.  Musculoskeletal: Normal range of motion. She exhibits tenderness. She exhibits no edema.  Patient is spontaneously moving all extremities in a coordinated fashion exhibiting good strength. His strength is 5 out of 5 in her bilateral upper and lower extremities. Patient is able to ambulate without difficulty or assistance. There is no lower extremity edema. Bilateral posterior tibialis pulses are intact. The patient reports pain to palpation to her entire left side when touched.   Lymphadenopathy:    She has no cervical adenopathy.  Neurological: She is alert and oriented to person, place, and time. No cranial nerve deficit. Coordination normal.  Cranial nerves II through XII are intact bilaterally. Patient's sensation is intact in her bilateral face, bilateral upper extremities as well as bilateral lower extremities. There is no facial asymmetry or swelling. No pronator drift. Finger to nose is intact bilaterally. Rapid alternating movements are intact bilaterally. EOMs are intact bilaterally.  Skin: Skin is warm and dry. No rash noted. She is not diaphoretic. No erythema. No pallor.   Psychiatric: She has a normal mood and affect. Her behavior is normal.  Nursing note and vitals reviewed.   ED Course  Procedures (including critical care time) Labs Review Labs Reviewed  BASIC METABOLIC PANEL - Abnormal; Notable for the following:    Sodium 136 (*)    Glucose, Bld 122 (*)    All other components within normal limits  CBC  WITH DIFFERENTIAL  Randolm Idol, ED    Imaging Review Dg Chest 2 View  03/29/2014   CLINICAL DATA:  Cough for 4 days, left-sided chest pain  EXAM: CHEST  2 VIEW  COMPARISON:  11/07/2013  FINDINGS: Cardiomediastinal silhouette is unremarkable. No acute infiltrate or pleural effusion. No pulmonary edema. Bony thorax is unremarkable.  IMPRESSION: No active cardiopulmonary disease.   Electronically Signed   By: Lahoma Crocker M.D.   On: 03/29/2014 12:06     EKG Interpretation None     EKG is not crossing over, please see muse for EKG.   Filed Vitals:   03/29/14 1101 03/29/14 1211 03/29/14 1230  BP: 127/81 109/69 115/71  Pulse: 103 89 90  Temp: 99.1 F (37.3 C)    TempSrc: Oral    Resp: 18 20 17   Height: 5\' 5"  (1.651 m)    Weight: 225 lb (102.059 kg)    SpO2: 96% 97% 99%     MDM   Meds given in ED:  Medications  ketorolac (TORADOL) injection 60 mg (60 mg Intramuscular Given 03/29/14 1243)    Discharge Medication List as of 03/29/2014 12:47 PM      Final diagnoses:  Body aches  Chronic nonintractable headache, unspecified headache type   Lori Ford is a 38 y.o. female with a history of hypertension, GERD, chronic low back pain, seizures, and brain tumor presents to emergency department complaining of left-sided body pain for the past 1.5 months. Her pain is from the top of her left side of her head and travels all the way into her left foot. Patient is afebrile and nontoxic-appearing. The patient is resting comfortably on the bed and appears in no apparent distress. Patient has no neurological deficits on exam. The patient's  sensation is intact in her bilateral face, bilateral upper extremities as well as bilateral lower extremities. Patient's abdomen is soft and nontender to palpation. The patient's symptoms have been going on for a month and a half and she has not addressed them with her primary care provider. The patient reports chronic urine incontinence. She denies abd pain, loss of bowel control or saddle anesthesia.  Patient's troponin is negative. The patient's CBC and BMP are unremarkable. Patient's chest x-ray is unremarkable. After discussing with Dr. Eulis Foster we see no need for further imaging due to the chronic nature of this patient's problem as well has a negative neurological exam. The patient is in agreement with this plan for no imaging today. We'll provide the patient with Toradol IM. The patient is very agreeable to this. Advised patient to follow-up with her primary care provider this week. Advised patient to return to the emergency department with near worsening symptoms or new concerns. Patient verbalized understanding and agreed with plan.  This patient was discussed with Dr. Eulis Foster who agrees with assessment and plan.      Hanley Hays, PA-C 03/29/14 [place], MD 03/29/14 [phone]

## 2014-04-08 ENCOUNTER — Emergency Department (HOSPITAL_COMMUNITY): Payer: Medicaid Other

## 2014-04-08 ENCOUNTER — Emergency Department (HOSPITAL_COMMUNITY)
Admission: EM | Admit: 2014-04-08 | Discharge: 2014-04-08 | Disposition: A | Discharge status: Home / Self Care | Payer: Medicaid Other | Attending: Emergency Medicine | Admitting: Emergency Medicine

## 2014-04-08 ENCOUNTER — Encounter (HOSPITAL_COMMUNITY): Payer: Self-pay | Admitting: *Deleted

## 2014-04-08 DIAGNOSIS — Z91040 Latex allergy status: Secondary | ICD-10-CM | POA: Diagnosis not present

## 2014-04-08 DIAGNOSIS — Z87828 Personal history of other (healed) physical injury and trauma: Secondary | ICD-10-CM | POA: Insufficient documentation

## 2014-04-08 DIAGNOSIS — D496 Neoplasm of unspecified behavior of brain: Secondary | ICD-10-CM

## 2014-04-08 DIAGNOSIS — Z7951 Long term (current) use of inhaled steroids: Secondary | ICD-10-CM | POA: Insufficient documentation

## 2014-04-08 DIAGNOSIS — R519 Headache, unspecified: Secondary | ICD-10-CM

## 2014-04-08 DIAGNOSIS — Z7952 Long term (current) use of systemic steroids: Secondary | ICD-10-CM | POA: Diagnosis not present

## 2014-04-08 DIAGNOSIS — M25569 Pain in unspecified knee: Secondary | ICD-10-CM

## 2014-04-08 DIAGNOSIS — M7989 Other specified soft tissue disorders: Secondary | ICD-10-CM

## 2014-04-08 DIAGNOSIS — R202 Paresthesia of skin: Secondary | ICD-10-CM | POA: Diagnosis not present

## 2014-04-08 DIAGNOSIS — I10 Essential (primary) hypertension: Secondary | ICD-10-CM | POA: Insufficient documentation

## 2014-04-08 DIAGNOSIS — Z85841 Personal history of malignant neoplasm of brain: Secondary | ICD-10-CM | POA: Insufficient documentation

## 2014-04-08 DIAGNOSIS — Z21 Asymptomatic human immunodeficiency virus [HIV] infection status: Secondary | ICD-10-CM | POA: Insufficient documentation

## 2014-04-08 DIAGNOSIS — G40909 Epilepsy, unspecified, not intractable, without status epilepticus: Secondary | ICD-10-CM | POA: Insufficient documentation

## 2014-04-08 DIAGNOSIS — M25562 Pain in left knee: Secondary | ICD-10-CM | POA: Insufficient documentation

## 2014-04-08 DIAGNOSIS — J45909 Unspecified asthma, uncomplicated: Secondary | ICD-10-CM | POA: Insufficient documentation

## 2014-04-08 DIAGNOSIS — Z79899 Other long term (current) drug therapy: Secondary | ICD-10-CM | POA: Diagnosis not present

## 2014-04-08 DIAGNOSIS — M1990 Unspecified osteoarthritis, unspecified site: Secondary | ICD-10-CM | POA: Insufficient documentation

## 2014-04-08 DIAGNOSIS — K219 Gastro-esophageal reflux disease without esophagitis: Secondary | ICD-10-CM | POA: Insufficient documentation

## 2014-04-08 DIAGNOSIS — M79605 Pain in left leg: Secondary | ICD-10-CM

## 2014-04-08 DIAGNOSIS — M542 Cervicalgia: Secondary | ICD-10-CM | POA: Diagnosis not present

## 2014-04-08 DIAGNOSIS — Z720 Tobacco use: Secondary | ICD-10-CM | POA: Diagnosis not present

## 2014-04-08 DIAGNOSIS — R51 Headache: Secondary | ICD-10-CM | POA: Diagnosis not present

## 2014-04-08 DIAGNOSIS — Z86718 Personal history of other venous thrombosis and embolism: Secondary | ICD-10-CM | POA: Insufficient documentation

## 2014-04-08 DIAGNOSIS — Z3202 Encounter for pregnancy test, result negative: Secondary | ICD-10-CM | POA: Diagnosis not present

## 2014-04-08 DIAGNOSIS — M79662 Pain in left lower leg: Secondary | ICD-10-CM | POA: Insufficient documentation

## 2014-04-08 LAB — POC URINE PREG, ED: Preg Test, Ur: NEGATIVE

## 2014-04-08 MED ORDER — HYDROCODONE-ACETAMINOPHEN 5-325 MG PO TABS
1.0000 | ORAL_TABLET | Freq: Once | ORAL | Status: AC
  Administered 2014-04-08: 1 via ORAL
  Filled 2014-04-08: qty 1

## 2014-04-08 MED ORDER — SODIUM CHLORIDE 0.9 % IV BOLUS (SEPSIS): 1000.0000 mL | Freq: Once | INTRAVENOUS | Status: DC

## 2014-04-08 MED ORDER — PROCHLORPERAZINE EDISYLATE 5 MG/ML IJ SOLN
10.0000 mg | Freq: Four times a day (QID) | INTRAMUSCULAR | Status: DC | PRN
  Administered 2014-04-08: 10 mg via INTRAVENOUS
  Filled 2014-04-08: qty 2

## 2014-04-08 MED ORDER — DIPHENHYDRAMINE HCL 50 MG/ML IJ SOLN
25.0000 mg | Freq: Once | INTRAMUSCULAR | Status: AC
  Administered 2014-04-08: 25 mg via INTRAVENOUS
  Filled 2014-04-08: qty 1

## 2014-04-08 NOTE — ED Notes (Signed)
US at bedside

## 2014-04-08 NOTE — ED Provider Notes (Signed)
CSN: [id number]     Arrival date & time 04/08/14  1743 History   First MD Initiated Contact with Patient 04/08/14 1759     Chief Complaint  Patient presents with  . Headache  . Leg Pain     (Consider location/radiation/quality/duration/timing/severity/associated sxs/prior Treatment) Patient is a 38 y.o. female presenting with leg pain and headaches.  Leg Pain Location:  Knee and leg Leg location:  L leg Knee location:  L knee Pain details:    Quality:  Aching   Severity:  Severe   Onset quality:  Gradual   Timing:  Constant   Progression:  Waxing and waning Chronicity:  Chronic Prior injury to area:  No (had prior DVT) Worsened by:  Nothing tried Ineffective treatments:  None tried Associated symptoms: neck pain   Associated symptoms: no back pain, no decreased ROM, no fatigue, no fever, no muscle weakness, no numbness, no stiffness, no swelling and no tingling   Risk factors: obesity   Risk factors comment:  HIV Headache Pain location:  L temporal Quality:  Sharp and dull Radiates to: down whole left side, from left neck to feet. Severity at highest:  7/10 Onset quality:  Gradual Duration:  4 weeks Timing:  Constant Progression:  Worsening Chronicity: 1 mo. Similar to prior headaches: yes   Relieved by:  Nothing Worsened by:  Nothing tried Ineffective treatments:  None tried Associated symptoms: neck pain   Associated symptoms: no abdominal pain, no back pain, no cough, no fatigue, no fever and no sore throat  Numbness: occasional tingling left side of neck to left shoulder.     Past Medical History  Diagnosis Date  . 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 Seizures     due to head trauma as adult  . Asthma     as child  . Meningioma   . Gallstones     s/p cholecystectomy  . HTN (hypertension)   . GERD (gastroesophageal reflux disease)   . Allergic rhinitis   . Cigarette nicotine dependence with withdrawal   . DVT (deep venous thrombosis)   .  Chronic back pain   . Sciatic pain   . Arthritis   . HIV (human immunodeficiency virus infection)    Past Surgical History  Procedure Laterality Date  . Brain surgery      2013 to remove a meningioma  . Cholecystectomy     Family History  Problem Relation Age of Onset  . Other Mother     varicose vein  . Asthma Mother   . High blood pressure Mother   . Deep vein thrombosis Neg Hx   . Pulmonary embolism Neg Hx   . Cancer    . Diabetes    . High blood pressure    . Asthma    . Thyroid disease    . Cancer Father    History  Substance Use Topics  . Smoking status: Current Every Day Smoker -- 0.25 packs/day for 16 years    Types: Cigarettes  . Smokeless tobacco: Never Used  . Alcohol Use: No   OB History    Gravida Para Term Preterm AB TAB SAB Ectopic Multiple Living   3 1 1  2 2    1      Review of Systems  Constitutional: Negative for fever and fatigue.  HENT: Negative for sore throat.   Eyes: Negative for visual disturbance.  Respiratory: Negative for cough and shortness of breath.   Cardiovascular:  Negative for chest pain.  Gastrointestinal: Negative for abdominal pain.  Genitourinary: Negative for difficulty urinating.  Musculoskeletal: Positive for arthralgias and neck pain. Negative for back pain and stiffness.  Skin: Negative for rash.  Neurological: Positive for headaches. Negative for syncope and weakness. Numbness: occasional tingling left side of neck to left shoulder.      Allergies  Ibuprofen; Clindamycin/lincomycin; Coconut flavor; Flagyl; Latex; Other; and Aspirin  Home Medications   Prior to Admission medications   Medication Sig Start Date End Date Taking? Authorizing Provider  acetaminophen (TYLENOL) 500 MG tablet Take 1,000 mg by mouth every 6 (six) hours as needed for moderate pain or headache.    Historical Provider, MD  albuterol (PROVENTIL HFA;VENTOLIN HFA) 108 (90 BASE) MCG/ACT inhaler Inhale 2 puffs into the lungs every 4 (four) hours  as needed for wheezing or shortness of breath.     Historical Provider, MD  albuterol (PROVENTIL) (2.5 MG/3ML) 0.083% nebulizer solution Take 2.5 mg by nebulization every 6 (six) hours as needed for wheezing or shortness of breath.    Historical Provider, MD  beclomethasone (QVAR) 40 MCG/ACT inhaler Inhale 2 puffs into the lungs 2 (two) times daily.     Historical Provider, MD  benzocaine (HURRICAINE) 20 % oral spray Apply 1 application topically 3 (three) times daily as needed for pain.    Historical Provider, MD  cyclobenzaprine (FLEXERIL) 10 MG tablet Take 1 tablet (10 mg total) by mouth 2 (two) times daily as needed for muscle spasms. 02/01/13   Larence Penning, MD  diazepam (VALIUM) 10 MG tablet Take 10 mg by mouth every 6 (six) hours as needed for anxiety.    Historical Provider, MD  divalproex (DEPAKOTE) 500 MG DR tablet Take 1 tablet (500 mg total) by mouth 3 (three) times daily. 07/09/13   Harvie Heck, PA-C  divalproex (DEPAKOTE) 500 MG DR tablet Take 1 tablet (500 mg total) by mouth 3 (three) times daily. 01/06/14   Johnna Acosta, MD  fluticasone (FLONASE) 50 MCG/ACT nasal spray Place 1 spray into the nose 2 (two) times daily.    Historical Provider, MD  folic acid (FOLVITE) 1 MG tablet Take 1 mg by mouth daily.    Historical Provider, MD  gabapentin (NEURONTIN) 400 MG capsule Take 800 mg by mouth 3 (three) times daily.     Historical Provider, MD  hydrochlorothiazide (HYDRODIURIL) 25 MG tablet Take 1 tablet (25 mg total) by mouth daily. 07/09/13   Harvie Heck, PA-C  levETIRAcetam (KEPPRA) 1000 MG tablet Take 1 tablet (1,000 mg total) by mouth 2 (two) times daily. 07/09/13   Harvie Heck, PA-C  mirtazapine (REMERON) 15 MG tablet Take 15 mg by mouth at bedtime.    Historical Provider, MD  omeprazole (PRILOSEC) 20 MG capsule Take 20 mg by mouth 2 (two) times daily.    Historical Provider, MD  oxyCODONE-acetaminophen (PERCOCET) 10-325 MG per tablet Take 1 tablet by mouth every 4 (four) hours as  needed for pain.    Historical Provider, MD  PARoxetine (PAXIL) 40 MG tablet Take 1 tablet (40 mg total) by mouth daily. 07/09/13   Harvie Heck, PA-C  promethazine (PHENERGAN) 6.25 MG/5ML syrup Take 12.5 mg by mouth every 6 (six) hours as needed for nausea or vomiting.    Historical Provider, MD  propranolol (INDERAL) 20 MG tablet Take 1 tablet (20 mg total) by mouth 2 (two) times daily. 07/09/13   Harvie Heck, PA-C  sodium chloride (OCEAN) 0.65 % nasal spray Place 1 spray into the  nose 2 (two) times daily as needed for congestion.     Historical Provider, MD  triamcinolone ointment (KENALOG) 0.5 % Apply 1 application topically 2 (two) times daily.    Historical Provider, MD   BP 128/60 mmHg  Pulse 110  Temp(Src) 98.2 F (36.8 C) (Oral)  Resp 18  Ht 5\' 5"  (1.651 m)  Wt 255 lb 4.8 oz (115.803 kg)  BMI 42.48 kg/m2  SpO2 99%  LMP 04/08/2014 Physical Exam  Constitutional: She is oriented to person, place, and time. She appears well-developed and well-nourished. No distress.  HENT:  Head: Normocephalic and atraumatic.  Eyes: Conjunctivae and EOM are normal.  Neck: Normal range of motion.  Cardiovascular: Normal rate, regular rhythm, normal heart sounds and intact distal pulses.  Exam reveals no gallop and no friction rub.   No murmur heard. Pulmonary/Chest: Effort normal and breath sounds normal. No respiratory distress. She has no wheezes. She has no rales.  Abdominal: Soft. She exhibits no distension. There is no tenderness. There is no guarding.  Musculoskeletal:       Right knee: She exhibits normal range of motion.       Left knee: She exhibits decreased range of motion and swelling. She exhibits no ecchymosis, no deformity, no laceration, no erythema and normal alignment. Tenderness found.       Right ankle: She exhibits normal pulse.       Left ankle: She exhibits normal pulse.       Left lower leg: She exhibits tenderness, bony tenderness, swelling and edema. She exhibits no  deformity and no laceration.  Neurological: She is alert and oriented to person, place, and time. She has normal strength. No cranial nerve deficit or sensory deficit. She displays a negative Romberg sign. Coordination and gait (slight limp from knee pain, no ataxia) normal. GCS eye subscore is 4. GCS verbal subscore is 5. GCS motor subscore is 6.  Skin: Skin is warm and dry. No rash noted. She is not diaphoretic. No erythema.  Nursing note and vitals reviewed.   ED Course  Procedures (including critical care time) Labs Review Labs Reviewed - No data to display  Imaging Review No results found.   EKG Interpretation None      MDM   Final diagnoses:  Headache   38 year old female with a history of HIV, left-sided meningioma status post resection in 2012, DVT, asthma, seizure disorders presents with concern of months intermittent however worsening left leg pain and swelling, and 1 months of left-sided headache. Patient has been seen previously for headache.  I we'll suspicion headache represents a subarachnoid hemorrhage, meningitis.  Given history of meningioma a CT head was ordered which did not show any signs of large space-occupying mass. She was given a headache cocktail prior to discharge and reported improvement of headache.  Patient also concerned regarding left knee and lower leg pain and swelling that has been present since she's had a DVT had IVC filter and was taken off of Lovenox months ago. A DVT study was ordered which showed no signs of acute DVT. An x-ray of her left knee was done given pain with movement and no prior imaging which showed degenerative changes.  Have low suspicion that knee or leg pain represents a septic arthritis or soft tissue infection given chronicity of symptoms. She was given Norco for the leg pain. Discussed all results with patient in detail. She was discharged in stable condition with understanding of reasons to return and will follow up closely  with  her doctor.    Alvino Chapel, MD 04/09/14 1201  Virgel Manifold, MD 04/14/14 [phone]

## 2014-04-08 NOTE — ED Notes (Signed)
Lt. Side headache pain. Pain radiating behind left eye. No blurred vision; also, lt. Lower leg pain and swelling. Duration (1 mos.)

## 2014-04-08 NOTE — ED Notes (Signed)
Pt states that 6 months ago she had a filter placed because of blood clots in her left leg. States that she was on lovenox at that time but has stopped taking them since she got the filter.

## 2014-04-08 NOTE — Progress Notes (Signed)
Left lower extremity venous duplex completed.  Left:  No evidence of DVT, superficial thrombosis, or Baker's cyst.  Right:  Negative for DVT in the common femoral vein.  

## 2014-04-26 ENCOUNTER — Ambulatory Visit (INDEPENDENT_AMBULATORY_CARE_PROVIDER_SITE_OTHER): Payer: Medicaid Other

## 2014-04-26 ENCOUNTER — Other Ambulatory Visit (HOSPITAL_COMMUNITY)
Admission: RE | Admit: 2014-04-26 | Discharge: 2014-04-26 | Disposition: A | Discharge status: Home / Self Care | Payer: Medicaid Other | Source: Ambulatory Visit | Attending: Infectious Disease | Admitting: Infectious Disease

## 2014-04-26 DIAGNOSIS — Z202 Contact with and (suspected) exposure to infections with a predominantly sexual mode of transmission: Secondary | ICD-10-CM

## 2014-04-26 DIAGNOSIS — Z79899 Other long term (current) drug therapy: Secondary | ICD-10-CM

## 2014-04-26 DIAGNOSIS — B20 Human immunodeficiency virus [HIV] disease: Secondary | ICD-10-CM

## 2014-04-26 DIAGNOSIS — Z113 Encounter for screening for infections with a predominantly sexual mode of transmission: Secondary | ICD-10-CM | POA: Diagnosis present

## 2014-04-26 DIAGNOSIS — I10 Essential (primary) hypertension: Secondary | ICD-10-CM

## 2014-04-26 LAB — CBC WITH DIFFERENTIAL/PLATELET
Basophils Absolute: 0 10*3/uL (ref 0.0–0.1)
Basophils Relative: 0 % (ref 0–1)
EOS ABS: 0.1 10*3/uL (ref 0.0–0.7)
EOS PCT: 1 % (ref 0–5)
HCT: 39.7 % (ref 36.0–46.0)
HEMOGLOBIN: 13.6 g/dL (ref 12.0–15.0)
Lymphocytes Relative: 41 % (ref 12–46)
Lymphs Abs: 2.3 10*3/uL (ref 0.7–4.0)
MCH: 30.4 pg (ref 26.0–34.0)
MCHC: 34.3 g/dL (ref 30.0–36.0)
MCV: 88.8 fL (ref 78.0–100.0)
MPV: 10.9 fL (ref 8.6–12.4)
Monocytes Absolute: 0.5 10*3/uL (ref 0.1–1.0)
Monocytes Relative: 9 % (ref 3–12)
NEUTROS PCT: 49 % (ref 43–77)
Neutro Abs: 2.7 10*3/uL (ref 1.7–7.7)
PLATELETS: 252 10*3/uL (ref 150–400)
RBC: 4.47 MIL/uL (ref 3.87–5.11)
RDW: 14.9 % (ref 11.5–15.5)
WBC: 5.6 10*3/uL (ref 4.0–10.5)

## 2014-04-26 LAB — COMPLETE METABOLIC PANEL WITH GFR
ALT: 10 U/L (ref 0–35)
AST: 12 U/L (ref 0–37)
Albumin: 4 g/dL (ref 3.5–5.2)
Alkaline Phosphatase: 60 U/L (ref 39–117)
BILIRUBIN TOTAL: 0.5 mg/dL (ref 0.2–1.2)
BUN: 8 mg/dL (ref 6–23)
CALCIUM: 9.5 mg/dL (ref 8.4–10.5)
CO2: 24 meq/L (ref 19–32)
CREATININE: 0.76 mg/dL (ref 0.50–1.10)
Chloride: 101 mEq/L (ref 96–112)
GFR, Est Non African American: >89 mL/min
Glucose, Bld: 89 mg/dL (ref 70–99)
Potassium: 4.1 mEq/L (ref 3.5–5.3)
Sodium: 136 mEq/L (ref 135–145)
TOTAL PROTEIN: 7.5 g/dL (ref 6.0–8.3)

## 2014-04-26 LAB — LIPID PANEL
CHOLESTEROL: 163 mg/dL (ref 0–200)
HDL: 36 mg/dL — AB (ref >39)
LDL Cholesterol: 106 mg/dL — ABNORMAL HIGH (ref 0–99)
TRIGLYCERIDES: 103 mg/dL (ref <150)
Total CHOL/HDL Ratio: 4.5 Ratio
VLDL: 21 mg/dL (ref 0–40)

## 2014-04-27 LAB — URINALYSIS
Bilirubin Urine: NEGATIVE
GLUCOSE, UA: NEGATIVE mg/dL
Ketones, ur: NEGATIVE mg/dL
LEUKOCYTES UA: NEGATIVE
Nitrite: NEGATIVE
Protein, ur: NEGATIVE mg/dL
Specific Gravity, Urine: 1.017 (ref 1.005–1.030)
Urobilinogen, UA: 0.2 mg/dL (ref 0.0–1.0)
pH: 7.5 (ref 5.0–8.0)

## 2014-04-27 LAB — T-HELPER CELL (CD4) - (RCID CLINIC ONLY)
CD4 T CELL HELPER: 30 % — AB (ref 33–55)
CD4 T Cell Abs: 690 /uL (ref 400–2700)

## 2014-04-27 LAB — HEPATITIS C ANTIBODY: HCV Ab: NEGATIVE

## 2014-04-27 LAB — HEPATITIS B SURFACE ANTIBODY,QUALITATIVE: Hep B S Ab: POSITIVE — AB

## 2014-04-27 LAB — HIV-1 RNA ULTRAQUANT REFLEX TO GENTYP+
HIV 1 RNA QUANT: 1819 {copies}/mL — AB (ref <20)
HIV-1 RNA Quant, Log: 3.26 {Log} — ABNORMAL HIGH (ref <1.30)

## 2014-04-27 LAB — URINE CYTOLOGY ANCILLARY ONLY
CHLAMYDIA, DNA PROBE: NEGATIVE
NEISSERIA GONORRHEA: NEGATIVE

## 2014-04-27 LAB — HEPATITIS B SURFACE ANTIGEN: Hepatitis B Surface Ag: NEGATIVE

## 2014-04-27 LAB — HEPATITIS A ANTIBODY, TOTAL: HEP A TOTAL AB: NONREACTIVE

## 2014-04-27 LAB — HEPATITIS B CORE ANTIBODY, TOTAL: HEP B C TOTAL AB: NONREACTIVE

## 2014-04-27 LAB — RPR

## 2014-04-28 NOTE — Progress Notes (Signed)
Patient diagnosed with HIV 2015 in [place] by her ex- husband. She moved to [place] shortly afterwards "to get away from him". She and her 2 children were recntly homeless and moved into housing last week. She is currently receiving treatment for recurrent brain tumor with [facility] and has Dr Jimmye Norman as her priamry care physician.  She has never been in care for HIV.  Vaccines updated .  Will need signed release for medical records from [place] but they mostly relate to brain tumor history.   Laverle Patter, RN

## 2014-04-29 DIAGNOSIS — Z23 Encounter for immunization: Secondary | ICD-10-CM

## 2014-05-02 LAB — HLA B*5701: HLA-B 5701 W/RFLX HLA-B HIGH: NEGATIVE

## 2014-05-06 LAB — HIV-1 GENOTYPR PLUS

## 2014-05-11 ENCOUNTER — Ambulatory Visit (INDEPENDENT_AMBULATORY_CARE_PROVIDER_SITE_OTHER): Payer: Medicaid Other | Admitting: Infectious Disease

## 2014-05-11 ENCOUNTER — Encounter: Payer: Self-pay | Admitting: Infectious Disease

## 2014-05-11 VITALS — BP 139/78 | HR 76 | Temp 98.4°F | Wt 252.0 lb

## 2014-05-11 DIAGNOSIS — F17213 Nicotine dependence, cigarettes, with withdrawal: Secondary | ICD-10-CM

## 2014-05-11 DIAGNOSIS — G40409 Other generalized epilepsy and epileptic syndromes, not intractable, without status epilepticus: Secondary | ICD-10-CM

## 2014-05-11 DIAGNOSIS — F339 Major depressive disorder, recurrent, unspecified: Secondary | ICD-10-CM

## 2014-05-11 DIAGNOSIS — B20 Human immunodeficiency virus [HIV] disease: Secondary | ICD-10-CM

## 2014-05-11 DIAGNOSIS — D329 Benign neoplasm of meninges, unspecified: Secondary | ICD-10-CM

## 2014-05-11 DIAGNOSIS — D496 Neoplasm of unspecified behavior of brain: Secondary | ICD-10-CM

## 2014-05-11 DIAGNOSIS — Z23 Encounter for immunization: Secondary | ICD-10-CM

## 2014-05-11 DIAGNOSIS — I10 Essential (primary) hypertension: Secondary | ICD-10-CM

## 2014-05-11 MED ORDER — ABACAVIR-DOLUTEGRAVIR-LAMIVUD 600-50-300 MG PO TABS: 1.0000 | ORAL_TABLET | Freq: Every day | ORAL | Status: DC

## 2014-05-11 NOTE — Progress Notes (Signed)
Subjective:    Patient ID: Lori Ford, female    DOB: 12/18/1975, 39 y.o.   MRN: [id number]  HPI  39 year old [place] American lady with history of meningioma that was rx with Neurosurgery in [place], seizure disorder who tested positive for HIV roughly a year ago but was apparently told by an ID MD that she did not need to start ARV therapy.  Apparently her husband was being unfaithful to her and she was concerned for STD's.   I clarified with the patient that if this was indeed the case that this was incorrect information and not consistent with modern guidelines and evidence based medicine.  She has fairly low viral load   Lab Results  Component Value Date   HIV1RNAQUANT 1819* 04/26/2014    And healthy CD4 count.  Lab Results  Component Value Date   CD4TABS 690 04/26/2014   Unfortunately, her husband transmitted a K103 and V108I to her wiping out EFV.  She has been in primary care with Dr. Jimmye Norman and states that she has had recurrence of her meningioma but I cannot find mention of it on CT. She was to have MRI but this was not done due to size issue at that time.  We reviewed modern ARV regimens and I would like her  To start [place]  She has some depression related to her diagnosis and would like to talk to [facility]. She does not want to participate in Women's group at this time.  Her children which she took with her from [place] in moving to [place] do NOT know about her diagnosis and none of her family know. She says if they found out they would "take her children from her."   Review of Systems  Constitutional: Negative for fever, chills, diaphoresis, activity change, appetite change, fatigue and unexpected weight change.  HENT: Negative for congestion, rhinorrhea, sinus pressure, sneezing, sore throat and trouble swallowing.   Eyes: Negative for photophobia and visual disturbance.  Respiratory: Negative for cough, chest tightness, shortness of breath, wheezing and stridor.     Cardiovascular: Negative for chest pain, palpitations and leg swelling.  Gastrointestinal: Negative for nausea, vomiting, abdominal pain, diarrhea, constipation, blood in stool, abdominal distention and anal bleeding.  Genitourinary: Negative for dysuria, hematuria, flank pain and difficulty urinating.  Musculoskeletal: Negative for myalgias, back pain, joint swelling, arthralgias and gait problem.  Skin: Negative for color change, pallor, rash and wound.  Neurological: Negative for dizziness, tremors, weakness and light-headedness.  Hematological: Negative for adenopathy. Does not bruise/bleed easily.  Psychiatric/Behavioral: Negative for behavioral problems, confusion, sleep disturbance, dysphoric mood, decreased concentration and agitation.       Objective:   Physical Exam  Constitutional: She is oriented to person, place, and time. She appears well-developed and well-nourished. No distress.  HENT:  Head: Normocephalic and atraumatic.  Mouth/Throat: No oropharyngeal exudate.  Eyes: Conjunctivae and EOM are normal. No scleral icterus.  Neck: Normal range of motion. Neck supple.  Cardiovascular: Normal rate and regular rhythm.   Pulmonary/Chest: Effort normal. No respiratory distress. She has no wheezes.  Abdominal: She exhibits no distension.  Musculoskeletal: She exhibits no edema or tenderness.  Neurological: She is alert and oriented to person, place, and time. She exhibits normal muscle tone. Coordination normal.  Skin: Skin is warm and dry. No rash noted. She is not diaphoretic. No erythema. No pallor.  Psychiatric: She has a normal mood and affect. Her behavior is normal. Judgment and thought content normal.  Assessment & Plan:   HIV disease: start [place] and have repeat labs in one month. I spent greater than 40 minutes with the patient including greater than 50% of time in face to face counsel of the patient and in coordination of their care.   Recurrent  meningioma: I have re-ordered MRI of brain with contrast  Seizure disorder: meds being filled by Dr. Jimmye Norman for these. NO significant drug drug interactions. NOTE WOULD ALWAYS CAUTION DOUBLE CHECKING DRUG DRUG INTERACTIONS WITH HER HIV MEDS EVEN THOUGH I PICKED A VERY "CLEAN REGIMEN."  HTN: to follow up with PCP  Major Depression, difficulty coping with diagnosis: also have her see [facility]  Smoking: need to work on getting her to stop

## 2014-05-18 ENCOUNTER — Telehealth: Payer: Self-pay | Admitting: Licensed Clinical Social Worker

## 2014-05-18 NOTE — Addendum Note (Signed)
Addended by: Jarrett Ables D on: 05/18/2014 11:03 AM   Modules accepted: Orders

## 2014-05-18 NOTE — Telephone Encounter (Signed)
Patient's MRI appointment is 06/03/2014 at 1:00pm she will need to arrive at 12 noon. She will need to take her Valium with her and take it when she arrives for her MRI in front of the tech. Patient was on the bus and could not write it down. She said she would call back to get the information.

## 2014-05-23 ENCOUNTER — Other Ambulatory Visit: Payer: Self-pay | Admitting: Neurology

## 2014-05-25 NOTE — Telephone Encounter (Signed)
Patient hasn't been seen in over 1 year.  I called, got no answer.  Left message.

## 2014-06-01 ENCOUNTER — Other Ambulatory Visit: Payer: Self-pay | Admitting: Neurology

## 2014-06-01 NOTE — Telephone Encounter (Signed)
Patient has not been seen since 2014.  I called, got no answer.  Left message.

## 2014-06-03 ENCOUNTER — Ambulatory Visit (HOSPITAL_COMMUNITY): Admission: RE | Admit: 2014-06-03 | Payer: Medicaid Other | Source: Ambulatory Visit

## 2014-06-03 NOTE — Telephone Encounter (Signed)
I called again, got no answer.  Left message.

## 2014-06-07 NOTE — Telephone Encounter (Signed)
I called the patient again.  Got no answer.  Left message.

## 2014-06-08 ENCOUNTER — Encounter: Payer: Self-pay | Admitting: *Deleted

## 2014-06-08 NOTE — Progress Notes (Signed)
Patient ID: Lori Ford, female   DOB: [birth year], 39 y.o.   MRN: [id number] Walk-in to clinic due to "cold" symptoms for 2 days; cough, stuffy nose, green-yellow sputum.  Pt states that her temperature was 101 F this morning and she took 2 Tylenol for this.  Requesting appt.  First available appt next Wednesday.  RN advised pt to go to [facility] for evaluation.  Pt also asked about a PCP.  RN advised that the [facility] is accepting new patients.  Pt stated that she would contact them for an appt.

## 2014-06-08 NOTE — Patient Instructions (Signed)
RN advised the pt to drink plenty of liquids while having "cold" symptoms.  Pt verbalized understanding.

## 2014-06-23 ENCOUNTER — Other Ambulatory Visit: Payer: Self-pay | Admitting: Licensed Clinical Social Worker

## 2014-06-23 MED ORDER — ABACAVIR-DOLUTEGRAVIR-LAMIVUD 600-50-300 MG PO TABS: 1.0000 | ORAL_TABLET | Freq: Every day | ORAL | Status: DC

## 2014-07-06 ENCOUNTER — Other Ambulatory Visit: Payer: Medicaid Other

## 2014-07-20 ENCOUNTER — Other Ambulatory Visit (HOSPITAL_COMMUNITY)
Admission: RE | Admit: 2014-07-20 | Discharge: 2014-07-20 | Disposition: A | Discharge status: Home / Self Care | Payer: Medicaid Other | Source: Ambulatory Visit | Attending: Infectious Disease | Admitting: Infectious Disease

## 2014-07-20 ENCOUNTER — Encounter: Payer: Self-pay | Admitting: Infectious Disease

## 2014-07-20 ENCOUNTER — Ambulatory Visit (INDEPENDENT_AMBULATORY_CARE_PROVIDER_SITE_OTHER): Payer: Medicaid Other | Admitting: Infectious Disease

## 2014-07-20 VITALS — BP 127/86 | HR 90 | Temp 99.4°F | Wt 259.0 lb

## 2014-07-20 DIAGNOSIS — Z113 Encounter for screening for infections with a predominantly sexual mode of transmission: Secondary | ICD-10-CM | POA: Diagnosis not present

## 2014-07-20 DIAGNOSIS — B20 Human immunodeficiency virus [HIV] disease: Secondary | ICD-10-CM

## 2014-07-20 DIAGNOSIS — G40409 Other generalized epilepsy and epileptic syndromes, not intractable, without status epilepticus: Secondary | ICD-10-CM | POA: Diagnosis present

## 2014-07-20 DIAGNOSIS — D496 Neoplasm of unspecified behavior of brain: Secondary | ICD-10-CM

## 2014-07-20 DIAGNOSIS — D329 Benign neoplasm of meninges, unspecified: Secondary | ICD-10-CM | POA: Diagnosis not present

## 2014-07-20 LAB — COMPLETE METABOLIC PANEL WITH GFR
ALBUMIN: 4.3 g/dL (ref 3.5–5.2)
ALT: 10 U/L (ref 0–35)
AST: 11 U/L (ref 0–37)
Alkaline Phosphatase: 70 U/L (ref 39–117)
BUN: 11 mg/dL (ref 6–23)
CHLORIDE: 99 meq/L (ref 96–112)
CO2: 27 meq/L (ref 19–32)
CREATININE: 0.78 mg/dL (ref 0.50–1.10)
Calcium: 9.5 mg/dL (ref 8.4–10.5)
GFR, Est African American: >89 mL/min
GFR, Est Non African American: >89 mL/min
GLUCOSE: 73 mg/dL (ref 70–99)
Potassium: 4.1 mEq/L (ref 3.5–5.3)
Sodium: 135 mEq/L (ref 135–145)
TOTAL PROTEIN: 7.8 g/dL (ref 6.0–8.3)
Total Bilirubin: 0.3 mg/dL (ref 0.2–1.2)

## 2014-07-20 LAB — CBC WITH DIFFERENTIAL/PLATELET
BASOS ABS: 0.1 10*3/uL (ref 0.0–0.1)
Basophils Relative: 1 % (ref 0–1)
EOS ABS: 0.1 10*3/uL (ref 0.0–0.7)
EOS PCT: 1 % (ref 0–5)
HCT: 38.8 % (ref 36.0–46.0)
Hemoglobin: 13.1 g/dL (ref 12.0–15.0)
LYMPHS ABS: 3 10*3/uL (ref 0.7–4.0)
Lymphocytes Relative: 44 % (ref 12–46)
MCH: 30.9 pg (ref 26.0–34.0)
MCHC: 33.8 g/dL (ref 30.0–36.0)
MCV: 91.5 fL (ref 78.0–100.0)
MONO ABS: 0.6 10*3/uL (ref 0.1–1.0)
MPV: 11.1 fL (ref 8.6–12.4)
Monocytes Relative: 8 % (ref 3–12)
Neutro Abs: 3.2 10*3/uL (ref 1.7–7.7)
Neutrophils Relative %: 46 % (ref 43–77)
PLATELETS: 239 10*3/uL (ref 150–400)
RBC: 4.24 MIL/uL (ref 3.87–5.11)
RDW: 15.1 % (ref 11.5–15.5)
WBC: 6.9 10*3/uL (ref 4.0–10.5)

## 2014-07-20 NOTE — Progress Notes (Signed)
Patient ID: Hurley Blevins, female   DOB: 09-04-1975, 39 y.o.   MRN: [id number] HPI: Kiaya Haliburton is a 39 y.o. female who is here for her f/u.   Allergies: Allergies  Allergen Reactions  . Ibuprofen Shortness Of Breath    wheezing  . Clindamycin/Lincomycin Hives  . Coconut Flavor Hives  . Flagyl [Metronidazole] Other (See Comments)    "it gave me a real bad bacterial infection"  . Latex Other (See Comments)    Rash, wheezing  . Other Swelling    "GRAPE SUBSTANCE"  . Aspirin Palpitations    wheezing    Vitals: Temp: 99.4 F (37.4 C) (04/06 0913) Temp Source: Oral (04/06 0913) BP: 127/86 mmHg (04/06 0913) Pulse Rate: 90 (04/06 0913)  Past Medical History: Past Medical History  Diagnosis Date  . 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 Seizures     due to head trauma as adult  . Asthma     as child  . Meningioma   . Gallstones     s/p cholecystectomy  . HTN (hypertension)   . GERD (gastroesophageal reflux disease)   . Allergic rhinitis   . Cigarette nicotine dependence with withdrawal   . DVT (deep venous thrombosis)   . Chronic back pain   . Sciatic pain   . Arthritis   . HIV (human immunodeficiency virus infection)     Social History: History   Social History  . Marital Status: Single    Spouse Name: N/A  . Number of Children: 1  . Years of Education: college   Occupational History  . Disabled    Social History Main Topics  . Smoking status: Current Every Day Smoker -- 0.25 packs/day for 18 years    Types: Cigarettes  . Smokeless tobacco: Never Used  . Alcohol Use: No  . Drug Use: Yes    Special: Marijuana     Comment: marijuana/ occasionally  . Sexual Activity: No   Other Topics Concern  . None   Social History Narrative   Lives with fiance.  Does not have a PCP.  [address] Dr. Jimmye Norman, walk in.  Has insurance.  [facility] [address].      Previous Regimen:   Current Regimen:  Triumeq  Labs: HIV 1 RNA QUANT (copies/mL)   Date Value  04/26/2014 1819*   CD4 T CELL ABS (/uL)  Date Value  04/26/2014 690   HEP B S AB (no units)  Date Value  04/26/2014 POS*   HEPATITIS B SURFACE AG (no units)  Date Value  04/26/2014 NEGATIVE   HCV AB (no units)  Date Value  04/26/2014 NEGATIVE    CrCl: CrCl cannot be calculated (Patient has no serum creatinine result on file.).  Lipids:    Component Value Date/Time   CHOL 163 04/26/2014 1054   TRIG 103 04/26/2014 1054   HDL 36* 04/26/2014 1054   CHOLHDL 4.5 04/26/2014 1054   VLDL 21 04/26/2014 1054   LDLCALC 106* 04/26/2014 1054    Assessment: 39 yo who is here for her f/u of her HIV. She has had a long hx of epilepsy also. She stated that she has been on various antiepileptic meds in the past that included phenytoin, carbemazepine, phenobarb, keppra, valproic acid. Antiepileptic meds can have severe interactions with her HIV meds. The current meds may be on of the best choices to use. She is going to family practice today for the seizure issues. Currently she is on  VPA and keppra. If other antiepileptic meds will be needed, please see potential list below.   If Trileptal, phenobarb, or dilantin is needed, Continue her on Triumeq 1 PO qAM + Tivicay 50mg  PO qPM.   Recommendations:  Cont Triumeq 1 PO qday for now See list in progress note for anti-epileptic options  Wilfred Lacy, PharmD Clinical Infectious [facility] 07/20/2014, 11:24 AM

## 2014-07-20 NOTE — Progress Notes (Signed)
   Subjective:    Patient ID: Lori Ford, female    DOB: 1975/09/18, 39 y.o.   MRN: [id number]  HPI Lori Ford is a 39 y/o AA female who presents for HIV follow up. She was prescribed Triumeq in Jan but has not taken this since beginning of March, stating she didn't know where her Rx was called in. She states she had some nausea and vomiting when she took the Triumeq without food, she did not have this problem when she took her med with food. She did not come in for labs prior to this visit. She has been having blurry vision and migraines, increasing in frequency and severity. Taking Depakote and Keppra for seizure pphx. Last tonic clonic seizure was two days ago, states she is having seizures 2-4X/week. States she has had a fever for the past two days up to 99.0 with productive cough and yellow/green mucus. She believes this to be related to her allergies and is taking Flonase.  Scheduled to see PCP today at 1500- states she needs Rx for Adderal Scheduled to see Neurology in May   Review of Systems Constitutional: Positive for fever and chills. Neg for diaphoresis, activity change, appetite change, fatigue and unexpected weight change.  HENT: Negative for congestion, rhinorrhea, sinus pressure, sneezing, sore throat and trouble swallowing.  Eyes: Positive for blurry vision, extreme sensitivity to light. Respiratory: Positive for cough and yellow/green mucus. Neg for chest tightness, shortness of breath, wheezing and stridor.  Cardiovascular: Negative for chest pain, palpitations and leg swelling.  Gastrointestinal: Negative for nausea, vomiting, abdominal pain, diarrhea, constipation, blood in stool, abdominal distention and anal bleeding.  Genitourinary: Negative for dysuria, hematuria, flank pain and difficulty urinating.  Musculoskeletal: Negative for myalgias, back pain, joint swelling, arthralgias and gait problem.  Skin: Negative for color change, pallor, rash and wound.  Neurological:  Positive for occassional left leg weakness and seizures. Negative for dizziness, tremors, and light-headedness.  Hematological: Negative for adenopathy. Does not bruise/bleed easily.  Psychiatric/Behavioral: Positive for decreased concentration. Neg for confusion, sleep disturbance, dysphoric mood, and agitation.     Objective:   Physical Exam Constitutional: She is oriented to person, place, and time. She appears well-developed and well-nourished. No distress.  HENT:  Head: Normocephalic and atraumatic.  Mouth/Throat: No oropharyngeal exudate.  Eyes: Conjunctivae normal. Nystagmus with EOM's. No scleral icterus.  Neck: Normal range of motion. Neck supple.  Cardiovascular: Normal rate and regular rhythm.  Pulmonary/Chest: Effort normal. No respiratory distress. Lung sounds diminished r/t body habitus.  Abdominal: She exhibits no distension.  Musculoskeletal: She exhibits no edema or tenderness.  Neurological: She is alert and oriented to person, place, and time. She exhibits normal muscle tone. Coordination normal.  Skin: Skin is warm and dry. No rash noted. She is not diaphoretic. No erythema. No pallor.  Psychiatric: She has a normal mood and affect. Her behavior is normal. Judgment and thought content normal.        Assessment & Plan:   HIV:  Continue Triumeq Redraw labs

## 2014-07-20 NOTE — Progress Notes (Signed)
Subjective:    Patient ID: Lori Ford, female    DOB: 04/21/1975, 39 y.o.   MRN: [id number]  HPI   For details please see NP Student note above.  Lori Ford has been noncompliant with TRIUMEQ stating that it was not available at pharmacy either [facility] on [address] or one closer to her home despite the fact that we subsequently called [facility] on [address] and they stated that they had plenty of TRIUMEQ and that pt was not coming to pickup meds.   She has had seizures despite being on AED but has FAILED to inform her Neurologist only waiting for an appt. I told her she SHOULD NEVER wait for appt to discuss problems related to her ARV or AEDs.   She is having sinus congestion that she thinks is due to her seasonal allergies with post nasal drip.  Lab Results  Component Value Date   HIV1RNAQUANT 1819* 04/26/2014    And healthy CD4 count.  Lab Results  Component Value Date   CD4TABS 690 04/26/2014   Unfortunately, her husband transmitted a K103 and V108I to her wiping out EFV.  She has been in primary care with Dr. Jimmye Norman and states that she has had recurrence of her meningioma but I cannot find mention of it on CT. She was to have MRI but this was not done due to size issue at that time.  We reviewed modern ARV regimens and I would like her  To start [place]  She has some depression related to her diagnosis and would like to talk to [name]. She does not want to participate in Women's group at this time.  Her children which she took with her from [place] in moving to [place] do NOT know about her diagnosis and none of her family know. She says if they found out they would "take her children from her."   Review of Systems  Constitutional: Negative for fever, chills, diaphoresis, activity change, appetite change, fatigue and unexpected weight change.  HENT: Positive for postnasal drip and sinus pressure. Negative for congestion, rhinorrhea, sneezing, sore throat and trouble swallowing.     Eyes: Negative for photophobia and visual disturbance.  Respiratory: Positive for cough. Negative for chest tightness, shortness of breath, wheezing and stridor.   Cardiovascular: Negative for chest pain, palpitations and leg swelling.  Gastrointestinal: Negative for nausea, vomiting, abdominal pain, diarrhea, constipation, blood in stool, abdominal distention and anal bleeding.  Genitourinary: Negative for dysuria, hematuria, flank pain and difficulty urinating.  Musculoskeletal: Negative for myalgias, back pain, joint swelling, arthralgias and gait problem.  Skin: Negative for color change, pallor, rash and wound.  Neurological: Positive for seizures. Negative for dizziness, tremors, weakness and light-headedness.  Hematological: Negative for adenopathy. Does not bruise/bleed easily.  Psychiatric/Behavioral: Negative for behavioral problems, confusion, sleep disturbance, dysphoric mood, decreased concentration and agitation.       Objective:   Physical Exam  Constitutional: She is oriented to person, place, and time. She appears well-developed and well-nourished. No distress.  HENT:  Head: Normocephalic and atraumatic.  Mouth/Throat: No oropharyngeal exudate.  Eyes: Conjunctivae and EOM are normal. No scleral icterus.  Neck: Normal range of motion. Neck supple.  Cardiovascular: Normal rate and regular rhythm.   Pulmonary/Chest: Effort normal. No respiratory distress. She has no wheezes.  Abdominal: She exhibits no distension.  Musculoskeletal: She exhibits no edema or tenderness.  Neurological: She is alert and oriented to person, place, and time. She exhibits normal muscle tone. Coordination normal.  Skin: Skin is warm  and dry. No rash noted. She is not diaphoretic. No erythema. No pallor.  Psychiatric: She has a normal mood and affect. Her behavior is normal. Judgment and thought content normal.          Assessment & Plan:   HIV disease: recheck labs.  Check with [facility]  and [place] to see if she has actually PICKED Up meds. I suspect she is again in denial and has decided she does not need her ARVs  Recurrent meningioma: I have re-ordered MRI of brain with contrast but pt never had this done  Seizure disorder: needs to followup with Neurology. [name] reviewed her meds with her today  HTN: to follow up with PCP  I spent greater than 25 minutes with the patient including greater than 50% of time in face to face counsel of the patient and in coordination of their care. Discussed need to be on ARV, on AED to communicate with providers.

## 2014-07-21 LAB — HIV-1 RNA ULTRAQUANT REFLEX TO GENTYP+
HIV 1 RNA QUANT: 1329 {copies}/mL — AB (ref <20)
HIV-1 RNA Quant, Log: 3.12 {Log} — ABNORMAL HIGH (ref <1.30)

## 2014-07-21 LAB — URINE CYTOLOGY ANCILLARY ONLY
CHLAMYDIA, DNA PROBE: NEGATIVE
NEISSERIA GONORRHEA: NEGATIVE

## 2014-07-21 LAB — RPR

## 2014-07-22 LAB — T-HELPER CELL (CD4) - (RCID CLINIC ONLY)
CD4 T CELL ABS: 830 /uL (ref 400–2700)
CD4 T CELL HELPER: 30 % — AB (ref 33–55)

## 2014-07-29 LAB — HIV-1 INTEGRASE GENOTYPE

## 2014-08-01 LAB — HIV-1 GENOTYPR PLUS

## 2014-08-02 ENCOUNTER — Emergency Department (HOSPITAL_COMMUNITY): Payer: Medicaid Other

## 2014-08-02 ENCOUNTER — Emergency Department (HOSPITAL_COMMUNITY)
Admission: EM | Admit: 2014-08-02 | Discharge: 2014-08-02 | Disposition: A | Discharge status: Home / Self Care | Payer: Medicaid Other | Attending: Emergency Medicine | Admitting: Emergency Medicine

## 2014-08-02 ENCOUNTER — Encounter (HOSPITAL_COMMUNITY): Payer: Self-pay | Admitting: Neurology

## 2014-08-02 DIAGNOSIS — J45901 Unspecified asthma with (acute) exacerbation: Secondary | ICD-10-CM | POA: Insufficient documentation

## 2014-08-02 DIAGNOSIS — R05 Cough: Secondary | ICD-10-CM | POA: Diagnosis present

## 2014-08-02 DIAGNOSIS — Z7951 Long term (current) use of inhaled steroids: Secondary | ICD-10-CM | POA: Insufficient documentation

## 2014-08-02 DIAGNOSIS — R51 Headache: Secondary | ICD-10-CM | POA: Diagnosis not present

## 2014-08-02 DIAGNOSIS — Z8739 Personal history of other diseases of the musculoskeletal system and connective tissue: Secondary | ICD-10-CM | POA: Insufficient documentation

## 2014-08-02 DIAGNOSIS — K219 Gastro-esophageal reflux disease without esophagitis: Secondary | ICD-10-CM | POA: Diagnosis not present

## 2014-08-02 DIAGNOSIS — Z720 Tobacco use: Secondary | ICD-10-CM | POA: Diagnosis not present

## 2014-08-02 DIAGNOSIS — Z91040 Latex allergy status: Secondary | ICD-10-CM | POA: Insufficient documentation

## 2014-08-02 DIAGNOSIS — R519 Headache, unspecified: Secondary | ICD-10-CM

## 2014-08-02 DIAGNOSIS — I10 Essential (primary) hypertension: Secondary | ICD-10-CM | POA: Diagnosis not present

## 2014-08-02 DIAGNOSIS — J40 Bronchitis, not specified as acute or chronic: Secondary | ICD-10-CM

## 2014-08-02 DIAGNOSIS — Z21 Asymptomatic human immunodeficiency virus [HIV] infection status: Secondary | ICD-10-CM | POA: Diagnosis not present

## 2014-08-02 DIAGNOSIS — Z79899 Other long term (current) drug therapy: Secondary | ICD-10-CM | POA: Insufficient documentation

## 2014-08-02 DIAGNOSIS — Z86718 Personal history of other venous thrombosis and embolism: Secondary | ICD-10-CM | POA: Insufficient documentation

## 2014-08-02 DIAGNOSIS — G8929 Other chronic pain: Secondary | ICD-10-CM | POA: Insufficient documentation

## 2014-08-02 MED ORDER — DIPHENHYDRAMINE HCL 50 MG/ML IJ SOLN
25.0000 mg | Freq: Once | INTRAMUSCULAR | Status: AC
  Administered 2014-08-02: 25 mg via INTRAMUSCULAR
  Filled 2014-08-02: qty 1

## 2014-08-02 MED ORDER — ALBUTEROL SULFATE HFA 108 (90 BASE) MCG/ACT IN AERS: 2.0000 | INHALATION_SPRAY | RESPIRATORY_TRACT | Status: DC | PRN

## 2014-08-02 MED ORDER — ALBUTEROL SULFATE (2.5 MG/3ML) 0.083% IN NEBU
5.0000 mg | INHALATION_SOLUTION | Freq: Once | RESPIRATORY_TRACT | Status: AC
  Administered 2014-08-02: 5 mg via RESPIRATORY_TRACT
  Filled 2014-08-02: qty 6

## 2014-08-02 MED ORDER — PHENYLEPHRINE-DM-GG-APAP 5-10-200-325 MG PO TABS: 2.0000 | ORAL_TABLET | Freq: Three times a day (TID) | ORAL | Status: DC

## 2014-08-02 MED ORDER — IPRATROPIUM BROMIDE 0.02 % IN SOLN
0.5000 mg | Freq: Once | RESPIRATORY_TRACT | Status: AC
  Administered 2014-08-02: 0.5 mg via RESPIRATORY_TRACT
  Filled 2014-08-02: qty 2.5

## 2014-08-02 MED ORDER — METOCLOPRAMIDE HCL 5 MG/ML IJ SOLN
10.0000 mg | Freq: Once | INTRAMUSCULAR | Status: AC
  Administered 2014-08-02: 10 mg via INTRAMUSCULAR
  Filled 2014-08-02: qty 2

## 2014-08-02 MED ORDER — ACETAMINOPHEN 325 MG PO TABS
650.0000 mg | ORAL_TABLET | Freq: Once | ORAL | Status: AC
  Administered 2014-08-02: 650 mg via ORAL
  Filled 2014-08-02: qty 2

## 2014-08-02 MED ORDER — SODIUM CHLORIDE 0.9 % IV BOLUS (SEPSIS): 500.0000 mL | INTRAVENOUS | Status: DC

## 2014-08-02 MED ORDER — PROCHLORPERAZINE EDISYLATE 5 MG/ML IJ SOLN
10.0000 mg | Freq: Once | INTRAMUSCULAR | Status: DC
  Filled 2014-08-02: qty 2

## 2014-08-02 MED ORDER — DIPHENHYDRAMINE HCL 50 MG/ML IJ SOLN
25.0000 mg | Freq: Once | INTRAMUSCULAR | Status: DC
Start: 2014-08-02 — End: 2014-08-02
  Filled 2014-08-02: qty 1

## 2014-08-02 NOTE — ED Provider Notes (Signed)
CSN: [id number]     Arrival date & time 08/02/14  6333 History   First MD Initiated Contact with Patient 08/02/14 0930     Chief Complaint  Patient presents with  . Cough  . Nasal Congestion  . Migraine   (Consider location/radiation/quality/duration/timing/severity/associated sxs/prior Treatment) HPI  Lori Ford is a 39 yo female presenting with report of migraine headache x 1 month. She describes it as similar to her usual migraines with pain on the left side of her forehead and rates it as a 10/10.  She tried a medicine she does not know the name of 3 weeks ago that was prescribed by her family doctor but she is out of it and is in the process of getting a new family doctor so she has only be using tylenol to treat the pain.  She also complains of nasal congestion, scratchy throat and productive cough over the last week.  She states she has been coughing up mucous and reports fever and chills at home.  She denies visual changes, unilateral weakness, abd pain, nausea, vomiting, diarrhea or shortness of breath.  Past Medical History  Diagnosis Date  . 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 Seizures     due to head trauma as adult  . Asthma     as child  . Meningioma   . Gallstones     s/p cholecystectomy  . HTN (hypertension)   . GERD (gastroesophageal reflux disease)   . Allergic rhinitis   . Cigarette nicotine dependence with withdrawal   . DVT (deep venous thrombosis)   . Chronic back pain   . Sciatic pain   . Arthritis   . HIV (human immunodeficiency virus infection)    Past Surgical History  Procedure Laterality Date  . Brain surgery      2013 to remove a meningioma  . Cholecystectomy     Family History  Problem Relation Age of Onset  . Other Mother     varicose vein  . Asthma Mother   . High blood pressure Mother   . Deep vein thrombosis Neg Hx   . Pulmonary embolism Neg Hx   . Cancer    . Diabetes    . High blood pressure    . Asthma    . Thyroid  disease    . Cancer Father    History  Substance Use Topics  . Smoking status: Current Every Day Smoker -- 0.25 packs/day for 18 years    Types: Cigarettes  . Smokeless tobacco: Never Used  . Alcohol Use: No   OB History    Gravida Para Term Preterm AB TAB SAB Ectopic Multiple Living   3 1 1  2 2    1      Review of Systems  Constitutional: Negative for fever and chills.  HENT: Positive for congestion, rhinorrhea and sore throat.   Eyes: Negative for visual disturbance.  Respiratory: Positive for cough. Negative for shortness of breath.   Cardiovascular: Negative for chest pain and leg swelling.  Gastrointestinal: Negative for nausea, vomiting and diarrhea.  Genitourinary: Negative for dysuria.  Musculoskeletal: Negative for myalgias.  Skin: Negative for rash.  Neurological: Positive for headaches. Negative for weakness and numbness.      Allergies  Ibuprofen; Clindamycin/lincomycin; Coconut flavor; Flagyl; Latex; Other; and Aspirin  Home Medications   Prior to Admission medications   Medication Sig Start Date End Date Taking? Authorizing Provider  Abacavir-Dolutegravir-Lamivud (TRIUMEQ) 600-50-300 MG TABS Take  1 tablet by mouth daily. 06/23/14   Truman Hayward, MD  acetaminophen (TYLENOL) 500 MG tablet Take 1,000 mg by mouth every 6 (six) hours as needed for moderate pain or headache.    Historical Provider, MD  albuterol (PROVENTIL HFA;VENTOLIN HFA) 108 (90 BASE) MCG/ACT inhaler Inhale 2 puffs into the lungs every 4 (four) hours as needed for wheezing or shortness of breath.     Historical Provider, MD  albuterol (PROVENTIL) (2.5 MG/3ML) 0.083% nebulizer solution Take 2.5 mg by nebulization every 6 (six) hours as needed for wheezing or shortness of breath.    Historical Provider, MD  alprazolam Duanne Moron) 2 MG tablet Take 2 mg by mouth 3 (three) times daily as needed for sleep.    Historical Provider, MD  beclomethasone (QVAR) 40 MCG/ACT inhaler Inhale 2 puffs into the  lungs 2 (two) times daily as needed (for shortness of breath).     Historical Provider, MD  benzocaine (HURRICAINE) 20 % oral spray Apply 1 application topically 3 (three) times daily as needed for pain.    Historical Provider, MD  cyclobenzaprine (FLEXERIL) 10 MG tablet Take 1 tablet (10 mg total) by mouth 2 (two) times daily as needed for muscle spasms. 02/01/13   Larence Penning, MD  diazepam (VALIUM) 10 MG tablet Take 10 mg by mouth every 6 (six) hours as needed for anxiety.    Historical Provider, MD  divalproex (DEPAKOTE) 500 MG DR tablet Take 1 tablet (500 mg total) by mouth 3 (three) times daily. 07/09/13   Harvie Heck, PA-C  divalproex (DEPAKOTE) 500 MG DR tablet Take 1 tablet (500 mg total) by mouth 3 (three) times daily. 01/06/14   Noemi Chapel, MD  fluticasone (FLONASE) 50 MCG/ACT nasal spray Place 1 spray into the nose 2 (two) times daily.    Historical Provider, MD  folic acid (FOLVITE) 1 MG tablet Take 1 mg by mouth daily.    Historical Provider, MD  gabapentin (NEURONTIN) 400 MG capsule Take 800 mg by mouth 3 (three) times daily.     Historical Provider, MD  hydrochlorothiazide (HYDRODIURIL) 25 MG tablet Take 1 tablet (25 mg total) by mouth daily. 07/09/13   Harvie Heck, PA-C  levETIRAcetam (KEPPRA) 1000 MG tablet Take 1 tablet (1,000 mg total) by mouth 2 (two) times daily. 07/09/13   Harvie Heck, PA-C  loratadine (CLARITIN) 10 MG tablet  03/03/14   Historical Provider, MD  mirtazapine (REMERON) 15 MG tablet Take 20 mg by mouth at bedtime.     Historical Provider, MD  omeprazole (PRILOSEC) 20 MG capsule Take 20 mg by mouth 2 (two) times daily.    Historical Provider, MD  oxyCODONE-acetaminophen (PERCOCET) 10-325 MG per tablet Take 1 tablet by mouth every 4 (four) hours as needed for pain.    Historical Provider, MD  PARoxetine (PAXIL) 40 MG tablet Take 1 tablet (40 mg total) by mouth daily. 07/09/13   Harvie Heck, PA-C  promethazine (PHENERGAN) 6.25 MG/5ML syrup Take 12.5 mg by mouth  every 6 (six) hours as needed for nausea or vomiting.    Historical Provider, MD  promethazine-codeine (PHENERGAN WITH CODEINE) 6.25-10 MG/5ML syrup  03/16/14   Historical Provider, MD  promethazine-dextromethorphan (PROMETHAZINE-DM) 6.25-15 MG/5ML syrup  03/16/14   Historical Provider, MD  propranolol (INDERAL) 10 MG tablet  01/28/14   Historical Provider, MD  sodium chloride (OCEAN) 0.65 % nasal spray Place 1 spray into the nose 2 (two) times daily as needed for congestion.     Historical Provider, MD  triamcinolone ointment (KENALOG) 0.5 % Apply 1 application topically 2 (two) times daily.    Historical Provider, MD   BP 133/79 mmHg  Pulse 100  Temp(Src) 99.2 F (37.3 C) (Oral)  Resp 20  Ht 5\' 5"  (1.651 m)  Wt 255 lb (115.667 kg)  BMI 42.43 kg/m2  SpO2 94%  LMP 08/02/2014 Physical Exam  Constitutional: She is oriented to person, place, and time. She appears well-developed and well-nourished. No distress.  HENT:  Head: Normocephalic and atraumatic.  Mouth/Throat: Oropharynx is clear and moist. No oropharyngeal exudate.  Eyes: Conjunctivae are normal.  Neck: Neck supple. No thyromegaly present.  Cardiovascular: Normal rate, regular rhythm and intact distal pulses.   Pulmonary/Chest: Effort normal. No respiratory distress. She has wheezes ( mild) in the right middle field and the left middle field. She has no rhonchi. She has no rales. She exhibits no tenderness.  Abdominal: Soft. There is no tenderness.  Musculoskeletal: She exhibits no tenderness.  Lymphadenopathy:    She has no cervical adenopathy.  Neurological: She is alert and oriented to person, place, and time. No cranial nerve deficit or sensory deficit. She exhibits normal muscle tone. Coordination normal. GCS eye subscore is 4. GCS verbal subscore is 5. GCS motor subscore is 6.  Cranial nerves 2-12 intact  Skin: Skin is warm and dry. No rash noted. She is not diaphoretic.  Psychiatric: She has a normal mood and affect.   Nursing note and vitals reviewed.   ED Course  Procedures (including critical care time) Labs Review Labs Reviewed - No data to display  Imaging Review Dg Chest 2 View  08/02/2014   CLINICAL DATA:  Cough and nasal congestion  EXAM: CHEST  2 VIEW  COMPARISON:  03/29/2014  FINDINGS: The heart size and mediastinal contours are within normal limits. Both lungs are clear. The visualized skeletal structures are unremarkable.  IMPRESSION: No active cardiopulmonary disease.   Electronically Signed   By: Franchot Gallo M.D.   On: 08/02/2014 11:41     EKG Interpretation None      MDM   Final diagnoses:  Nonintractable headache, unspecified chronicity pattern, unspecified headache type  Bronchitis   38 with symptoms consistent with bronchitis, treated in ED with nebulizer with improvement of symptoms.  Pt also complains of migraine for one month.  Her headache is like her usual migraines and not concerning for [facility], ICH, Meningitis, or temporal arteritis. Nursing staff was unable to gain IV access so discussed with pt would treat with IM meds and she is agreeable. He headache improved while in ED.  She remains afebrile with no focal neuro deficits, nuchal rigidity, or change in vision. Resources provided to follow up with PCP to discuss prophylactic medication. Return precautions provided. Pt verbalizes understanding and is agreeable with plan to dc.   Filed Vitals:   08/02/14 1030 08/02/14 1045 08/02/14 1215 08/02/14 1322  BP: 118/75 126/80 138/88 122/74  Pulse: 81 81 76 84  Temp:      TempSrc:      Resp:    20  Height:      Weight:      SpO2: 100% 99% 100% 97%   Meds given in ED:  Medications  albuterol (PROVENTIL) (2.5 MG/3ML) 0.083% nebulizer solution 5 mg (5 mg Nebulization Given 08/02/14 1128)  ipratropium (ATROVENT) nebulizer solution 0.5 mg (0.5 mg Nebulization Given 08/02/14 1129)  acetaminophen (TYLENOL) tablet 650 mg (650 mg Oral Given 08/02/14 1126)  diphenhydrAMINE  (BENADRYL) injection 25 mg (25 mg  Intramuscular Given 08/02/14 1250)  metoCLOPramide (REGLAN) injection 10 mg (10 mg Intramuscular Given 08/02/14 1250)    Discharge Medication List as of 08/02/2014 12:22 PM    START taking these medications   Details  !! albuterol (PROVENTIL HFA;VENTOLIN HFA) 108 (90 BASE) MCG/ACT inhaler Inhale 2 puffs into the lungs every 4 (four) hours as needed for wheezing or shortness of breath., Starting 08/02/2014, Until Discontinued, Print    Phenylephrine-DM-GG-APAP (MUCINEX FAST-MAX CONGEST COLD) 5-10-200-325 MG TABS Take 2 tablets by mouth 3 (three) times daily., Starting 08/02/2014, Until Discontinued, Print     !! - Potential duplicate medications found. Please discuss with provider.         Britt Bottom, NP 08/03/14 [place] Yao, MD 08/05/14 [phone]

## 2014-08-02 NOTE — ED Notes (Signed)
Pt returned from X-ray.  

## 2014-08-02 NOTE — Discharge Instructions (Signed)
Please follow directions provided. Use the resource guide or the referral given to establish care with a primary care doctor for further management of your headaches. Use the inhaler 2 puffs every 4 hours to help with cough or shortness of breath. Use the multisymptom cold medicine 3 times a day to help with other symptoms. Be sure to drink plenty of fluids by mouth to stay well hydrated. Don't hesitate to return for any new, worsening, or concerning symptoms.  SEEK IMMEDIATE MEDICAL CARE IF:  Your headache becomes severe.  You have a fever.  You have a stiff neck.  You have loss of vision.  You have muscular weakness or loss of muscle control.  You start losing your balance or have trouble walking.  You feel faint or pass out.  You have severe symptoms that are different from your first symptoms.   Emergency Department Resource Guide 1) Find a Doctor and Pay Out of Pocket Although you won't have to find out who is covered by your insurance plan, it is a good idea to ask around and get recommendations. You will then need to call the office and see if the doctor you have chosen will accept you as a new patient and what types of options they offer for patients who are self-pay. Some doctors offer discounts or will set up payment plans for their patients who do not have insurance, but you will need to ask so you aren't surprised when you get to your appointment.  2) Contact Your Local Health Department Not all health departments have doctors that can see patients for sick visits, but many do, so it is worth a call to see if yours does. If you don't know where your local health department is, you can check in your phone book. The CDC also has a tool to help you locate your state's health department, and many state websites also have listings of all of their local health departments.  3) Find a [facility] If your illness is not likely to be very severe or complicated, you may want to try a walk  in clinic. These are popping up all over the country in pharmacies, drugstores, and shopping centers. They're usually staffed by nurse practitioners or physician assistants that have been trained to treat common illnesses and complaints. They're usually fairly quick and inexpensive. However, if you have serious medical issues or chronic medical problems, these are probably not your best option.  No Primary Care Doctor: - Call Health Connect at  [phone] - they can help you locate a primary care doctor that  accepts your insurance, provides certain services, etc. - Physician Referral Service- [phone]  Chronic Pain Problems: Organization         Address  Phone   Notes  [facility]  [phone] Patients need to be referred by their primary care doctor.   Medication Assistance: Organization         Address  Phone   Notes  [facility] Medication [facility] [address] [place], [place] [address] [phone] --Must be a resident of [facility] -- Must have NO insurance coverage whatsoever (no Medicaid/ Medicare, etc.) -- The pt. MUST have a primary care doctor that directs their care regularly and follows them in the community   Med[facility]ist  [phone]   [employer]  [phone]    Agencies that provide inexpensive medical care: Organization         Address  Phone   Notes  [facility]  [phone]   [facility]    [phone]   [facility] [place], [place] [address] [phone]   [place]. [address], [place] [phone]   Planned Parenthood    [phone]   [facility]    [phone]   [place] and [place] [address], [place] Phone:  [phone], Fax:  [phone] Hours of Operation:  9 am - 6 pm, M-F.  Also accepts Medicaid/Medicare and self-pay.  [facility] for [place] [address], [address], [place] Phone: [phone], Fax: [phone]. Hours of Operation:  8:30 am - 5:30 pm, M-F.  Also accepts Medicaid and self-pay.  [facility] [place] [address], [place] Phone: [phone]   [place], [place], [place] [phone], Ext. [phone] Mondays & Thursdays: 7-9 AM.  First 15 patients are seen on a first come, first serve basis.    [place] Providers:  Organization         Address  Phone   Notes  [facility] [address], [place] [phone] Also accepts self-pay patients.  [facility] [address], [place]  [phone]   [place], [address], [place] [phone]   [facility] [address], [place] [phone]   Lucianne Lei [address], [place]   [phone] Only accepts [place] Access [place] patients after they have their name applied to their card.   Self-Pay (no insurance) in [facility]:  Organization         Address  Phone   Notes  Sickle Cell Patients, [facility] [place] [phone]   [facility] [place] [phone]   [facility] [place]  [place], [place], [place] [phone]   [facility]/Dr. Osei-Bonsu  [address], [place] or [address], [place] [phone] Phone number for both [place] and [place] locations is the same.  [facility]gent [facility] and [facility] [address], [place] [phone]   [facility] [address], [place] or [address] [phone]   [facility] [address] [place] [phone], phone; [phone], fax Sees  patients 1st and 3rd Saturday of every month.  Must not qualify for public or private insurance (i.e. Medicaid, Medicare,  Health Choice, Veterans' Benefits)  Household income should be no more than 200% of the poverty level The clinic cannot treat you if you are pregnant or think you are pregnant  Sexually transmitted diseases are not treated at the clinic.    Dental Care: Organization         Address  Phone  Notes  [facility] Department of [facility] [place] [phone] Accepts children up to age 4 who are enrolled in [place] or [place]; pregnant women with a Medicaid card; and children who have applied for Medicaid or  Health Choice, but were declined, whose parents can pay a reduced fee at time of service.  [facility] Department of [facility]  [address], [place] [phone] Accepts children up to age 17 who are enrolled in [place] or [place]; pregnant women with a Medicaid card; and children who have applied for Medicaid or  Health Choice, but were declined, whose parents can pay a reduced fee at time of service.  [place] Adult Dental Access PROGRAM  [place] [phone] Patients are seen by appointment only. Walk-ins are not accepted. [place] will see patients 46 years of age and older. Monday - Tuesday (8am-5pm) Most Wednesdays (8:30-5pm) $30 per visit, cash only  [facility] Adult Dental Access PROGRAM  [address], [facility] [phone] Patients are seen by appointment only. Walk-ins are not accepted. [place] will see patients 70 years of age and older. One Wednesday Evening (Monthly: Volunteer Based).  $30 per visit, cash only  [place]  [phone] for adults; Children under age 63, call Graduate Pediatric Dentistry at [phone]. Children aged 59-14, please call [phone] to request a  pediatric application.  Dental services are provided in all areas of dental care including fillings, crowns and bridges, complete and partial dentures, implants, gum treatment, root canals, and extractions. Preventive care is also provided. Treatment is provided to both adults and children. Patients are selected via a lottery and there is often a waiting list.   [facility] [address] [place]  [phone] [email or web address]   [facility] [address] [place], [place] [phone], Ext. [phone] Second and Fourth Thursday of each month, opens at 6:30 AM; Clinic ends at 9 AM.  Patients are seen on a first-come first-served basis, and a limited number are seen during each clinic.   [facility]  [address] Hillard Danker [place], [place] [phone]   Eligibility Requirements You must have lived in [place], [place], or [place] counties for at least the last three months.   You cannot be eligible for state or federal sponsored [employer], including [employer], [place], or [employer].   You generally cannot be eligible for healthcare insurance through your employer.    How to apply: Eligibility screenings are held every Tuesday and Wednesday afternoon from 1:00 pm until 4:00 pm. You do not need an appointment for the interview!  [facility] [address] [place], [place]   [place]  [place] Department  [place]  [phone]    Behavioral Health Resources in the Community: Intensive Outpatient Programs Organization         Address  Phone  Notes  [place]. [address], [place], [place] [phone]   [facility]patient [address] [place], [place]   ADS: Alcohol & Drug Svcs [address] [place], [place]   [place] [address],  [place], [place] or [phone]   Substance Abuse Resources Organization         Address  Phone  Notes  Alcohol and Drug Services  [phone]   [place]  [phone]   The [place]   Chinita Pester  [phone]   Residential & Outpatient Substance Abuse Program  [phone]   Psychological Services Organization         Address  Phone  Notes  [place]   [place] [address] [place] or [phone]    Mobile Crisis Teams Organization         Address  Phone  Notes  [facility], Mobile Crisis Care Unit  [phone]   [facility]  [address] [place], [place]   [place] [address] [place] [phone]    Self-Help/Support Groups Organization         Address  Phone             Notes  [place]. of [place] - variety of support groups  [place] Call for more information  Narcotics Anonymous (NA), [facility] [address], [employer] [place]  2 meetings at this location   Special educational needs teacher         Address  Phone  Notes  [facility] [place],    [place]  [phone]   [facility]  [address] [place] [phone], [place], [place]   [place], [place] [phone] Admissions: 8am-3pm M-F  Incentives Substance [place] [address]    [place], [place] [phone]   [facility] [address] [place], [place], [place]   [facility] [address],  [place], [place]   [facility] - Intensive Outpatient [address], [place], [place]   [facility] [place].,    [place], [place] [phone] or [phone]   [facility] (RTS) [address], [place], [place] Accepts Medicaid  [facility] [place] [address]  [place] [phone] Substance Abuse/Addiction Treatment   [facility] Organization         Address  Phone  Notes  [facility]  [phone]   Domenic Schwab, PhD [address] [place]lis [place], [place]   [phone] or [phone]   [place], [place] [phone]   [facility] [address], [place], [place] [phone] Insurance/Medicaid/sponsorship through [facility] and Families [address], [address] [place]                                    [place], [place] [phone] [place] [address], [place] [phone]    Dr. Adele Schilder  [phone]   [facility] Dept. 1) [address], [place] 2) [place] 3)  [place] [address], [place] [phone]  [phone]   [place] [phone] or [phone] (After Hours)

## 2014-08-02 NOTE — ED Notes (Signed)
Pt reports for 1 week; cough, congestion and soreness in chest from coughing. Also migraine h/a.

## 2014-08-06 ENCOUNTER — Encounter (HOSPITAL_COMMUNITY): Payer: Self-pay | Admitting: Nurse Practitioner

## 2014-08-06 ENCOUNTER — Emergency Department (HOSPITAL_COMMUNITY)
Admission: EM | Admit: 2014-08-06 | Discharge: 2014-08-06 | Disposition: A | Discharge status: Home / Self Care | Payer: Medicaid Other | Attending: Emergency Medicine | Admitting: Emergency Medicine

## 2014-08-06 DIAGNOSIS — Z3202 Encounter for pregnancy test, result negative: Secondary | ICD-10-CM | POA: Diagnosis not present

## 2014-08-06 DIAGNOSIS — R1110 Vomiting, unspecified: Secondary | ICD-10-CM | POA: Insufficient documentation

## 2014-08-06 DIAGNOSIS — Z7952 Long term (current) use of systemic steroids: Secondary | ICD-10-CM | POA: Insufficient documentation

## 2014-08-06 DIAGNOSIS — M1990 Unspecified osteoarthritis, unspecified site: Secondary | ICD-10-CM | POA: Insufficient documentation

## 2014-08-06 DIAGNOSIS — Z79899 Other long term (current) drug therapy: Secondary | ICD-10-CM | POA: Insufficient documentation

## 2014-08-06 DIAGNOSIS — Z91040 Latex allergy status: Secondary | ICD-10-CM | POA: Diagnosis not present

## 2014-08-06 DIAGNOSIS — Z9089 Acquired absence of other organs: Secondary | ICD-10-CM | POA: Diagnosis not present

## 2014-08-06 DIAGNOSIS — I10 Essential (primary) hypertension: Secondary | ICD-10-CM | POA: Insufficient documentation

## 2014-08-06 DIAGNOSIS — Z7951 Long term (current) use of inhaled steroids: Secondary | ICD-10-CM | POA: Diagnosis not present

## 2014-08-06 DIAGNOSIS — J45909 Unspecified asthma, uncomplicated: Secondary | ICD-10-CM | POA: Diagnosis not present

## 2014-08-06 DIAGNOSIS — K219 Gastro-esophageal reflux disease without esophagitis: Secondary | ICD-10-CM | POA: Insufficient documentation

## 2014-08-06 DIAGNOSIS — Z86718 Personal history of other venous thrombosis and embolism: Secondary | ICD-10-CM | POA: Diagnosis not present

## 2014-08-06 DIAGNOSIS — M79652 Pain in left thigh: Secondary | ICD-10-CM | POA: Insufficient documentation

## 2014-08-06 DIAGNOSIS — G40909 Epilepsy, unspecified, not intractable, without status epilepticus: Secondary | ICD-10-CM | POA: Diagnosis not present

## 2014-08-06 DIAGNOSIS — Z720 Tobacco use: Secondary | ICD-10-CM | POA: Insufficient documentation

## 2014-08-06 DIAGNOSIS — G8929 Other chronic pain: Secondary | ICD-10-CM | POA: Diagnosis not present

## 2014-08-06 DIAGNOSIS — M79662 Pain in left lower leg: Secondary | ICD-10-CM | POA: Diagnosis not present

## 2014-08-06 DIAGNOSIS — Z86011 Personal history of benign neoplasm of the brain: Secondary | ICD-10-CM | POA: Diagnosis not present

## 2014-08-06 DIAGNOSIS — R197 Diarrhea, unspecified: Secondary | ICD-10-CM | POA: Diagnosis not present

## 2014-08-06 DIAGNOSIS — M79605 Pain in left leg: Secondary | ICD-10-CM | POA: Diagnosis not present

## 2014-08-06 DIAGNOSIS — R112 Nausea with vomiting, unspecified: Secondary | ICD-10-CM

## 2014-08-06 DIAGNOSIS — B20 Human immunodeficiency virus [HIV] disease: Secondary | ICD-10-CM | POA: Diagnosis not present

## 2014-08-06 LAB — CBC WITH DIFFERENTIAL/PLATELET
Basophils Absolute: 0 10*3/uL (ref 0.0–0.1)
Basophils Relative: 1 % (ref 0–1)
Eosinophils Absolute: 0.1 10*3/uL (ref 0.0–0.7)
Eosinophils Relative: 1 % (ref 0–5)
HCT: 38 % (ref 36.0–46.0)
Hemoglobin: 12.8 g/dL (ref 12.0–15.0)
LYMPHS ABS: 2.6 10*3/uL (ref 0.7–4.0)
Lymphocytes Relative: 45 % (ref 12–46)
MCH: 30.8 pg (ref 26.0–34.0)
MCHC: 33.7 g/dL (ref 30.0–36.0)
MCV: 91.3 fL (ref 78.0–100.0)
Monocytes Absolute: 0.4 10*3/uL (ref 0.1–1.0)
Monocytes Relative: 7 % (ref 3–12)
NEUTROS PCT: 46 % (ref 43–77)
Neutro Abs: 2.7 10*3/uL (ref 1.7–7.7)
Platelets: 232 10*3/uL (ref 150–400)
RBC: 4.16 MIL/uL (ref 3.87–5.11)
RDW: 14 % (ref 11.5–15.5)
WBC: 5.8 10*3/uL (ref 4.0–10.5)

## 2014-08-06 LAB — COMPREHENSIVE METABOLIC PANEL
ALBUMIN: 3.4 g/dL — AB (ref 3.5–5.2)
ALT: 13 U/L (ref 0–35)
AST: 13 U/L (ref 0–37)
Alkaline Phosphatase: 65 U/L (ref 39–117)
Anion gap: 8 (ref 5–15)
BILIRUBIN TOTAL: 0.5 mg/dL (ref 0.3–1.2)
BUN: 10 mg/dL (ref 6–23)
CHLORIDE: 102 mmol/L (ref 96–112)
CO2: 26 mmol/L (ref 19–32)
CREATININE: 0.8 mg/dL (ref 0.50–1.10)
Calcium: 8.6 mg/dL (ref 8.4–10.5)
GFR calc Af Amer: >90 mL/min (ref >90)
GFR calc non Af Amer: >90 mL/min (ref >90)
Glucose, Bld: 86 mg/dL (ref 70–99)
POTASSIUM: 4 mmol/L (ref 3.5–5.1)
Sodium: 136 mmol/L (ref 135–145)
Total Protein: 6.8 g/dL (ref 6.0–8.3)

## 2014-08-06 LAB — URINALYSIS, ROUTINE W REFLEX MICROSCOPIC
GLUCOSE, UA: NEGATIVE mg/dL
KETONES UR: 15 mg/dL — AB
Leukocytes, UA: NEGATIVE
Nitrite: NEGATIVE
PROTEIN: 30 mg/dL — AB
Specific Gravity, Urine: 1.034 — ABNORMAL HIGH (ref 1.005–1.030)
UROBILINOGEN UA: 1 mg/dL (ref 0.0–1.0)
pH: 6.5 (ref 5.0–8.0)

## 2014-08-06 LAB — URINE MICROSCOPIC-ADD ON

## 2014-08-06 LAB — POC URINE PREG, ED: Preg Test, Ur: NEGATIVE

## 2014-08-06 LAB — LIPASE, BLOOD: Lipase: 17 U/L (ref 11–59)

## 2014-08-06 MED ORDER — OXYCODONE-ACETAMINOPHEN 5-325 MG PO TABS
2.0000 | ORAL_TABLET | Freq: Once | ORAL | Status: AC
  Administered 2014-08-06: 2 via ORAL
  Filled 2014-08-06: qty 2

## 2014-08-06 MED ORDER — HYDROCODONE-ACETAMINOPHEN 5-325 MG PO TABS: 1.0000 | ORAL_TABLET | ORAL | Status: DC | PRN

## 2014-08-06 MED ORDER — ONDANSETRON 4 MG PO TBDP: 4.0000 mg | ORAL_TABLET | Freq: Three times a day (TID) | ORAL | Status: DC | PRN

## 2014-08-06 MED ORDER — ONDANSETRON 4 MG PO TBDP
4.0000 mg | ORAL_TABLET | Freq: Once | ORAL | Status: AC
  Administered 2014-08-06: 4 mg via ORAL
  Filled 2014-08-06: qty 1

## 2014-08-06 NOTE — ED Notes (Signed)
Dr. Leonides Schanz and med student at bedside.

## 2014-08-06 NOTE — Progress Notes (Signed)
VASCULAR LAB PRELIMINARY  PRELIMINARY  PRELIMINARY  PRELIMINARY  Left lower extremity venous Doppler completed.    Preliminary report:  There is no DVT or SVT noted in the left lower extremity.  There is an enlarged lymph node noted in the left groin.  Lori Ford, RVT 08/06/2014, 12:39 PM

## 2014-08-06 NOTE — Discharge Instructions (Signed)
You were seen in the emergency department for left leg pain. Your ultrasound showed no sign of eat or superficial blood clots. No obvious sign of infection on your exam in your white blood cell count was normal and you did not have a fever today. Without any history of injury is very unlikely that you have a fracture. I recommended she keep your leg elevated, apply ice as needed. If your pain continues to swell up with your primary care physician for this.    Viral Gastroenteritis Viral gastroenteritis is also known as stomach flu. This condition affects the stomach and intestinal tract. It can cause sudden diarrhea and vomiting. The illness typically lasts 3 to 8 days. Most people develop an immune response that eventually gets rid of the virus. While this natural response develops, the virus can make you quite ill. CAUSES  Many different viruses can cause gastroenteritis, such as rotavirus or noroviruses. You can catch one of these viruses by consuming contaminated food or water. You may also catch a virus by sharing utensils or other personal items with an infected person or by touching a contaminated surface. SYMPTOMS  The most common symptoms are diarrhea and vomiting. These problems can cause a severe loss of body fluids (dehydration) and a body salt (electrolyte) imbalance. Other symptoms may include:  Fever.  Headache.  Fatigue.  Abdominal pain. DIAGNOSIS  Your caregiver can usually diagnose viral gastroenteritis based on your symptoms and a physical exam. A stool sample may also be taken to test for the presence of viruses or other infections. TREATMENT  This illness typically goes away on its own. Treatments are aimed at rehydration. The most serious cases of viral gastroenteritis involve vomiting so severely that you are not able to keep fluids down. In these cases, fluids must be given through an intravenous line (IV). HOME CARE INSTRUCTIONS   Drink enough fluids to keep your  urine clear or pale yellow. Drink small amounts of fluids frequently and increase the amounts as tolerated.  Ask your caregiver for specific rehydration instructions.  Avoid:  Foods high in sugar.  Alcohol.  Carbonated drinks.  Tobacco.  Juice.  Caffeine drinks.  Extremely hot or cold fluids.  Fatty, greasy foods.  Too much intake of anything at one time.  Dairy products until 24 to 48 hours after diarrhea stops.  You may consume probiotics. Probiotics are active cultures of beneficial bacteria. They may lessen the amount and number of diarrheal stools in adults. Probiotics can be found in yogurt with active cultures and in supplements.  Wash your hands well to avoid spreading the virus.  Only take over-the-counter or prescription medicines for pain, discomfort, or fever as directed by your caregiver. Do not give aspirin to children. Antidiarrheal medicines are not recommended.  Ask your caregiver if you should continue to take your regular prescribed and over-the-counter medicines.  Keep all follow-up appointments as directed by your caregiver. SEEK IMMEDIATE MEDICAL CARE IF:   You are unable to keep fluids down.  You do not urinate at least once every 6 to 8 hours.  You develop shortness of breath.  You notice blood in your stool or vomit. This may look like coffee grounds.  You have abdominal pain that increases or is concentrated in one small area (localized).  You have persistent vomiting or diarrhea.  You have a fever.  The patient is a child younger than 3 months, and he or she has a fever.  The patient is a child older  than 3 months, and he or she has a fever and persistent symptoms.  The patient is a child older than 3 months, and he or she has a fever and symptoms suddenly get worse.  The patient is a baby, and he or she has no tears when crying. MAKE SURE YOU:   Understand these instructions.  Will watch your condition.  Will get help right  away if you are not doing well or get worse. Document Released: 04/01/2005 Document Revised: 06/24/2011 Document Reviewed: 01/16/2011 [facility] Patient Information 2015 [place], [place]. This information is not intended to replace advice given to you by your health care provider. Make sure you discuss any questions you have with your health care provider.

## 2014-08-06 NOTE — ED Provider Notes (Signed)
TIME SEEN: 11:15 AM  CHIEF COMPLAINT:  Left leg pain, nausea, vomiting, diarrhea  HPI:  Patient is a 39 y.o. 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 She has an area of ecchymosis to the left anterior thigh but states she has not had any injury. Denies any chest pain or shortness of breath. No numbness, tingling or weakness. Also complaining of 2 days of nausea, vomiting and diarrhea. Last episode of vomiting and diarrhea was this morning. No history of abdominal surgeries. No dysuria, hematuria, vaginal bleeding or discharge. No sick contacts or recent travel. States last week she also had subjective fevers, nasal congestion, cough attributable this to a "cold" and states she is feeling better from this.  ROS: See HPI Constitutional: no fever  Eyes: no drainage  ENT: no runny nose   Cardiovascular:  no chest pain  Resp: no SOB  GI:  vomiting GU: no dysuria Integumentary: no rash  Allergy: no hives  Musculoskeletal: no leg swelling  Neurological: no slurred speech ROS otherwise negative  PAST MEDICAL HISTORY/PAST SURGICAL HISTORY:  Past Medical History  Diagnosis Date  . 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 Seizures     due to head trauma as adult  . Asthma     as child  . Meningioma   . Gallstones     s/p cholecystectomy  . HTN (hypertension)   . GERD (gastroesophageal reflux disease)   . Allergic rhinitis   . Cigarette nicotine dependence with withdrawal   . DVT (deep venous thrombosis)   . Chronic back pain   . Sciatic pain   . Arthritis   . HIV (human immunodeficiency virus infection)     MEDICATIONS:  Prior to Admission medications   Medication Sig Start Date End Date Taking? Authorizing Provider  Abacavir-Dolutegravir-Lamivud (TRIUMEQ)  600-50-300 MG TABS Take 1 tablet by mouth daily. 06/23/14   Truman Hayward, MD  acetaminophen (TYLENOL) 500 MG tablet Take 1,000 mg by mouth every 6 (six) hours as needed for moderate pain or headache.    Historical Provider, MD  albuterol (PROVENTIL HFA;VENTOLIN HFA) 108 (90 BASE) MCG/ACT inhaler Inhale 2 puffs into the lungs every 4 (four) hours as needed for wheezing or shortness of breath.     Historical Provider, MD  albuterol (PROVENTIL HFA;VENTOLIN HFA) 108 (90 BASE) MCG/ACT inhaler Inhale 2 puffs into the lungs every 4 (four) hours as needed for wheezing or shortness of breath. 08/02/14   Britt Bottom, NP  albuterol (PROVENTIL) (2.5 MG/3ML) 0.083% nebulizer solution Take 2.5 mg by nebulization every 6 (six) hours as needed for wheezing or shortness of breath.    Historical Provider, MD  alprazolam Duanne Moron) 2 MG tablet Take 2 mg by mouth 3 (three) times daily as needed for sleep.    Historical Provider, MD  beclomethasone (QVAR) 40 MCG/ACT inhaler Inhale 2 puffs into the lungs 2 (two) times daily as needed (for shortness of breath).     Historical Provider, MD  benzocaine (HURRICAINE) 20 % oral spray Apply 1 application topically 3 (three) times daily as needed for pain.    Historical Provider, MD  cyclobenzaprine (FLEXERIL) 10 MG tablet Take 1 tablet (10 mg total) by mouth 2 (two) times daily as needed for muscle spasms. 02/01/13  Larence Penning, MD  diazepam (VALIUM) 10 MG tablet Take 10 mg by mouth every 6 (six) hours as needed for anxiety.    Historical Provider, MD  divalproex (DEPAKOTE) 500 MG DR tablet Take 1 tablet (500 mg total) by mouth 3 (three) times daily. 07/09/13   Harvie Heck, PA-C  divalproex (DEPAKOTE) 500 MG DR tablet Take 1 tablet (500 mg total) by mouth 3 (three) times daily. 01/06/14   Noemi Chapel, MD  fluticasone (FLONASE) 50 MCG/ACT nasal spray Place 1 spray into the nose 2 (two) times daily.    Historical Provider, MD  folic acid (FOLVITE) 1 MG tablet Take 1 mg by  mouth daily.    Historical Provider, MD  gabapentin (NEURONTIN) 400 MG capsule Take 800 mg by mouth 3 (three) times daily.     Historical Provider, MD  hydrochlorothiazide (HYDRODIURIL) 25 MG tablet Take 1 tablet (25 mg total) by mouth daily. 07/09/13   Harvie Heck, PA-C  levETIRAcetam (KEPPRA) 1000 MG tablet Take 1 tablet (1,000 mg total) by mouth 2 (two) times daily. 07/09/13   Harvie Heck, PA-C  loratadine (CLARITIN) 10 MG tablet  03/03/14   Historical Provider, MD  mirtazapine (REMERON) 15 MG tablet Take 20 mg by mouth at bedtime.     Historical Provider, MD  omeprazole (PRILOSEC) 20 MG capsule Take 20 mg by mouth 2 (two) times daily.    Historical Provider, MD  oxyCODONE-acetaminophen (PERCOCET) 10-325 MG per tablet Take 1 tablet by mouth every 4 (four) hours as needed for pain.    Historical Provider, MD  PARoxetine (PAXIL) 40 MG tablet Take 1 tablet (40 mg total) by mouth daily. 07/09/13   Lauren Parker, PA-C  Phenylephrine-DM-GG-APAP (MUCINEX FAST-MAX CONGEST COLD) 5-10-200-325 MG TABS Take 2 tablets by mouth 3 (three) times daily. 08/02/14   Britt Bottom, NP  promethazine (PHENERGAN) 6.25 MG/5ML syrup Take 12.5 mg by mouth every 6 (six) hours as needed for nausea or vomiting.    Historical Provider, MD  promethazine-codeine (PHENERGAN WITH CODEINE) 6.25-10 MG/5ML syrup  03/16/14   Historical Provider, MD  promethazine-dextromethorphan (PROMETHAZINE-DM) 6.25-15 MG/5ML syrup  03/16/14   Historical Provider, MD  propranolol (INDERAL) 10 MG tablet  01/28/14   Historical Provider, MD  sodium chloride (OCEAN) 0.65 % nasal spray Place 1 spray into the nose 2 (two) times daily as needed for congestion.     Historical Provider, MD  triamcinolone ointment (KENALOG) 0.5 % Apply 1 application topically 2 (two) times daily.    Historical Provider, MD    ALLERGIES:  Allergies  Allergen Reactions  . Ibuprofen Shortness Of Breath    wheezing  . Clindamycin/Lincomycin Hives  . Coconut Flavor  Hives  . Flagyl [Metronidazole] Other (See Comments)    "it gave me a real bad bacterial infection"  . Latex Other (See Comments)    Rash, wheezing  . Other Swelling    "GRAPE SUBSTANCE"  . Aspirin Palpitations    wheezing    SOCIAL HISTORY:  History  Substance Use Topics  . Smoking status: Current Every Day Smoker -- 0.25 packs/day for 18 years    Types: Cigarettes  . Smokeless tobacco: Never Used  . Alcohol Use: No    FAMILY HISTORY: Family History  Problem Relation Age of Onset  . Other Mother     varicose vein  . Asthma Mother   . High blood pressure Mother   . Deep vein thrombosis Neg Hx   . Pulmonary embolism Neg Hx   . Cancer    .  Diabetes    . High blood pressure    . Asthma    . Thyroid disease    . Cancer Father     EXAM: BP 117/76 mmHg  Pulse 86  Temp(Src) 98.9 F (37.2 C) (Oral)  Resp 16  Ht 5\' 5"  (1.651 m)  Wt 256 lb (116.121 kg)  BMI 42.60 kg/m2  SpO2 95%  LMP 08/02/2014 CONSTITUTIONAL: Alert and oriented and responds appropriately to questions. Well-appearing; well-nourished , nontoxic, pleasant HEAD: Normocephalic EYES: Conjunctivae clear, PERRL ENT: normal nose; no rhinorrhea; moist mucous membranes; pharynx without lesions noted NECK: Supple, no meningismus, no LAD  CARD: RRR; S1 and S2 appreciated; no murmurs, no clicks, no rubs, no gallops RESP: Normal chest excursion without splinting or tachypnea; breath sounds clear and equal bilaterally; no wheezes, no rhonchi, no rales,  No hypoxia or respiratory distress, speaking full sentences ABD/GI: Normal bowel sounds; non-distended; soft, non-tender, no rebound, no guarding , no peritoneal signs BACK:  The back appears normal and is non-tender to palpation, there is no CVA tenderness EXT:  Tender to palpation over the left anterior thigh and posterior left calf with no appreciable swelling compared to the right side but exam is limited as patient is obese , no joint effusion, no erythema or  warmth, 2+ DP pulses bilaterally, normal sensation diffusely, no bony  Deformity, Normal ROM in all joints;  Otherwise extremities are non-tender to palpation; no edema; normal capillary refill; no cyanosis;  Compartments are soft   SKIN: Normal color for age and race; warm NEURO: Moves all extremities equally PSYCH: The patient's mood and manner are appropriate. Grooming and personal hygiene are appropriate.  MEDICAL DECISION MAKING:  Patient here with multiple complaints. She is complained of vomiting, diarrhea for the past several days. Does not appear dehydrated on exam. Hematologically stable. Abdominal exam is completely benign when she is distracted. We'll check basic abdominal labs, urine, urine pregnancy. We'll give Zofran. 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 She is a smoker and is obese but otherwise has no risk factors. She states she's never had a workup to figure out why she is developing blood clots. Has an appointment with her PCP Dr. Caprice Beaver on May 1. We'll obtain venous Doppler of her left lower extremity. She is neurovascularly intact distally without sign of infection. We will give Percocet for pain.  ED PROGRESS:  Patient's labs are unremarkable. Urine shows small hemoglobin and many bacteria but also many squamous cells. Suspect dirty catch. Venous Doppler negative for DVT or superficial thrombus phlebitis. She does have a enlarged lymph node in the inguinal area but has no obvious sign of cellulitis on exam and has no fever, no leukocytosis. Urine pregnancy test negative per technician and the many lab. The patient is safe to be discharged home. Discussed return precautions. She has follow-up appointment with her primary care provider on May 1. We'll discharge with pain and nausea medication. Discussed return precautions. She verbalized understanding and is comfortable with plan.     [place], DO 08/06/14  1401

## 2014-08-17 ENCOUNTER — Other Ambulatory Visit: Payer: Medicaid Other

## 2014-08-24 ENCOUNTER — Ambulatory Visit: Payer: Medicaid Other | Admitting: Infectious Disease

## 2014-08-30 ENCOUNTER — Emergency Department (HOSPITAL_COMMUNITY): Payer: Medicaid Other

## 2014-08-30 ENCOUNTER — Encounter (HOSPITAL_COMMUNITY): Payer: Self-pay | Admitting: Emergency Medicine

## 2014-08-30 ENCOUNTER — Emergency Department (HOSPITAL_COMMUNITY)
Admission: EM | Admit: 2014-08-30 | Discharge: 2014-08-30 | Disposition: A | Discharge status: Home / Self Care | Payer: Medicaid Other | Attending: Emergency Medicine | Admitting: Emergency Medicine

## 2014-08-30 DIAGNOSIS — G40909 Epilepsy, unspecified, not intractable, without status epilepticus: Secondary | ICD-10-CM | POA: Diagnosis not present

## 2014-08-30 DIAGNOSIS — Z720 Tobacco use: Secondary | ICD-10-CM | POA: Insufficient documentation

## 2014-08-30 DIAGNOSIS — Z79899 Other long term (current) drug therapy: Secondary | ICD-10-CM | POA: Insufficient documentation

## 2014-08-30 DIAGNOSIS — K219 Gastro-esophageal reflux disease without esophagitis: Secondary | ICD-10-CM | POA: Diagnosis not present

## 2014-08-30 DIAGNOSIS — N760 Acute vaginitis: Secondary | ICD-10-CM | POA: Diagnosis not present

## 2014-08-30 DIAGNOSIS — Z21 Asymptomatic human immunodeficiency virus [HIV] infection status: Secondary | ICD-10-CM | POA: Insufficient documentation

## 2014-08-30 DIAGNOSIS — I10 Essential (primary) hypertension: Secondary | ICD-10-CM | POA: Diagnosis not present

## 2014-08-30 DIAGNOSIS — M1990 Unspecified osteoarthritis, unspecified site: Secondary | ICD-10-CM | POA: Insufficient documentation

## 2014-08-30 DIAGNOSIS — Z3202 Encounter for pregnancy test, result negative: Secondary | ICD-10-CM | POA: Insufficient documentation

## 2014-08-30 DIAGNOSIS — D259 Leiomyoma of uterus, unspecified: Secondary | ICD-10-CM

## 2014-08-30 DIAGNOSIS — R102 Pelvic and perineal pain unspecified side: Secondary | ICD-10-CM

## 2014-08-30 DIAGNOSIS — R42 Dizziness and giddiness: Secondary | ICD-10-CM | POA: Diagnosis not present

## 2014-08-30 DIAGNOSIS — N83201 Unspecified ovarian cyst, right side: Secondary | ICD-10-CM

## 2014-08-30 DIAGNOSIS — B20 Human immunodeficiency virus [HIV] disease: Secondary | ICD-10-CM

## 2014-08-30 DIAGNOSIS — Z86018 Personal history of other benign neoplasm: Secondary | ICD-10-CM | POA: Insufficient documentation

## 2014-08-30 DIAGNOSIS — Z86718 Personal history of other venous thrombosis and embolism: Secondary | ICD-10-CM | POA: Diagnosis not present

## 2014-08-30 DIAGNOSIS — Z91040 Latex allergy status: Secondary | ICD-10-CM | POA: Diagnosis not present

## 2014-08-30 DIAGNOSIS — N8320 Unspecified ovarian cysts: Secondary | ICD-10-CM | POA: Insufficient documentation

## 2014-08-30 DIAGNOSIS — G8929 Other chronic pain: Secondary | ICD-10-CM | POA: Diagnosis not present

## 2014-08-30 DIAGNOSIS — N939 Abnormal uterine and vaginal bleeding, unspecified: Secondary | ICD-10-CM | POA: Diagnosis not present

## 2014-08-30 DIAGNOSIS — J45909 Unspecified asthma, uncomplicated: Secondary | ICD-10-CM | POA: Insufficient documentation

## 2014-08-30 DIAGNOSIS — Z7951 Long term (current) use of inhaled steroids: Secondary | ICD-10-CM | POA: Insufficient documentation

## 2014-08-30 DIAGNOSIS — B9689 Other specified bacterial agents as the cause of diseases classified elsewhere: Secondary | ICD-10-CM

## 2014-08-30 LAB — URINALYSIS, ROUTINE W REFLEX MICROSCOPIC
Bilirubin Urine: NEGATIVE
Glucose, UA: NEGATIVE mg/dL
KETONES UR: NEGATIVE mg/dL
Leukocytes, UA: NEGATIVE
Nitrite: NEGATIVE
Protein, ur: NEGATIVE mg/dL
SPECIFIC GRAVITY, URINE: 1.017 (ref 1.005–1.030)
UROBILINOGEN UA: 0.2 mg/dL (ref 0.0–1.0)
pH: 7 (ref 5.0–8.0)

## 2014-08-30 LAB — CBC WITH DIFFERENTIAL/PLATELET
Basophils Absolute: 0 10*3/uL (ref 0.0–0.1)
Basophils Relative: 1 % (ref 0–1)
EOS ABS: 0.1 10*3/uL (ref 0.0–0.7)
Eosinophils Relative: 1 % (ref 0–5)
HCT: 37.9 % (ref 36.0–46.0)
Hemoglobin: 12.8 g/dL (ref 12.0–15.0)
LYMPHS ABS: 3.1 10*3/uL (ref 0.7–4.0)
Lymphocytes Relative: 54 % — ABNORMAL HIGH (ref 12–46)
MCH: 31.1 pg (ref 26.0–34.0)
MCHC: 33.8 g/dL (ref 30.0–36.0)
MCV: 92.2 fL (ref 78.0–100.0)
Monocytes Absolute: 0.4 10*3/uL (ref 0.1–1.0)
Monocytes Relative: 7 % (ref 3–12)
NEUTROS PCT: 37 % — AB (ref 43–77)
Neutro Abs: 2.1 10*3/uL (ref 1.7–7.7)
PLATELETS: 251 10*3/uL (ref 150–400)
RBC: 4.11 MIL/uL (ref 3.87–5.11)
RDW: 13.9 % (ref 11.5–15.5)
WBC: 5.7 10*3/uL (ref 4.0–10.5)

## 2014-08-30 LAB — COMPREHENSIVE METABOLIC PANEL
ALT: 12 U/L — AB (ref 14–54)
AST: 15 U/L (ref 15–41)
Albumin: 3.5 g/dL (ref 3.5–5.0)
Alkaline Phosphatase: 58 U/L (ref 38–126)
Anion gap: 10 (ref 5–15)
BUN: 5 mg/dL — ABNORMAL LOW (ref 6–20)
CHLORIDE: 101 mmol/L (ref 101–111)
CO2: 26 mmol/L (ref 22–32)
Calcium: 9.3 mg/dL (ref 8.9–10.3)
Creatinine, Ser: 0.89 mg/dL (ref 0.44–1.00)
GLUCOSE: 104 mg/dL — AB (ref 65–99)
POTASSIUM: 4.2 mmol/L (ref 3.5–5.1)
SODIUM: 137 mmol/L (ref 135–145)
Total Bilirubin: 0.5 mg/dL (ref 0.3–1.2)
Total Protein: 6.9 g/dL (ref 6.5–8.1)

## 2014-08-30 LAB — WET PREP, GENITAL
TRICH WET PREP: NONE SEEN
Yeast Wet Prep HPF POC: NONE SEEN

## 2014-08-30 LAB — URINE MICROSCOPIC-ADD ON

## 2014-08-30 LAB — I-STAT TROPONIN, ED: Troponin i, poc: 0 ng/mL (ref 0.00–0.08)

## 2014-08-30 LAB — POC URINE PREG, ED: PREG TEST UR: NEGATIVE

## 2014-08-30 LAB — LIPASE, BLOOD: LIPASE: 23 U/L (ref 22–51)

## 2014-08-30 MED ORDER — MEFENAMIC ACID 250 MG PO CAPS: 500.0000 mg | ORAL_CAPSULE | Freq: Three times a day (TID) | ORAL | Status: DC

## 2014-08-30 MED ORDER — MORPHINE SULFATE 2 MG/ML IJ SOLN
2.0000 mg | Freq: Once | INTRAMUSCULAR | Status: AC
  Administered 2014-08-30: 2 mg via INTRAVENOUS
  Filled 2014-08-30: qty 1

## 2014-08-30 MED ORDER — SODIUM CHLORIDE 0.9 % IV BOLUS (SEPSIS)
1000.0000 mL | Freq: Once | INTRAVENOUS | Status: AC
  Administered 2014-08-30: 1000 mL via INTRAVENOUS

## 2014-08-30 MED ORDER — METRONIDAZOLE 0.75 % VA GEL: 1.0000 | Freq: Two times a day (BID) | VAGINAL | Status: DC

## 2014-08-30 MED ORDER — ONDANSETRON HCL 4 MG/2ML IJ SOLN
4.0000 mg | Freq: Once | INTRAMUSCULAR | Status: AC
  Administered 2014-08-30: 4 mg via INTRAVENOUS
  Filled 2014-08-30: qty 2

## 2014-08-30 NOTE — ED Provider Notes (Signed)
CSN: [id number]     Arrival date & time 08/30/14  0946 History   First MD Initiated Contact with Patient 08/30/14 [phone]     Chief Complaint  Patient presents with  . Vaginal Bleeding     (Consider location/radiation/quality/duration/timing/severity/associated sxs/prior Treatment) HPI Comments: Lori Ford is a 39 y.o. female with a PMHx of meningioma, remote seizures, migraine headaches, HTN, prior DVTs s/p IVC filter but was taken off Lovenox in September 2015, HIV on antiretroviral therapy, GERD, chronic back pain, sciatica, and uterine fibroids noted on U/S from 11/2013, who presents to the ED with complaints of 11 days of vaginal bleeding and lower abdominal pain. She reports that her menstrual cycle began on 08/14/14, lasted 4 days, then ceased until 08/19/14 when it returned and she has had vaginal bleeding since then. She is going through 6-7 pads per day, describes the blood as dark red and with passage of clots. She describes her lower abdominal pain as 10/10 crampy and sharp, radiating to her lower back, constant, with no known aggravating factors, and unrelieved with Tylenol and heat. She endorses associated symptoms of lightheadedness, nausea, and looser than normal stools approximately 4 times per day but denies that they're watery or bloody. She denies any fevers, chills, tinnitus, hearing loss, vertigo, dizziness, syncope, headache, vision changes, focal weakness, numbness, tingling, chest pain, shortness of breath, vomiting, constipation, obstipation, melena, hematochezia, flank pain, dysuria, hematuria, vaginal discharge, recent travel, sick contacts, alcohol use, NSAID use, antibiotics, or suspicious food intake. She is not on any blood thinners after she had her IVC placed last September. She is sexually active with 2 female partners, protected. Although she denies a history of similar symptoms, chart review reveals similar presentation of symptoms in 11/2013 at which time she was diagnosed  with fibroids. She is compliant with her antiretrovirals and follows up regularly with Dr. Tommy Medal. Her menses is usually regular.  Patient is a 39 y.o. female presenting with vaginal bleeding. The history is provided by the patient and medical records. No language interpreter was used.  Vaginal Bleeding Quality:  Dark red and clots Severity:  Moderate Onset quality:  Gradual Duration:  11 days Timing:  Constant Progression:  Unchanged Chronicity:  New Menstrual history:  Regular Number of pads used:  6-7/day Possible pregnancy: no   Context: spontaneously   Relieved by:  Nothing Worsened by:  Nothing tried Ineffective treatments:  Acetaminophen Associated symptoms: abdominal pain, back pain (radiating from abdomen) and nausea   Associated symptoms: no dizziness, no dysuria, no fever and no vaginal discharge   Risk factors: no bleeding disorder and does not have unprotected sex     Past Medical History  Diagnosis Date  . 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 Seizures     due to head trauma as adult  . Asthma     as child  . Meningioma   . Gallstones     s/p cholecystectomy  . HTN (hypertension)   . GERD (gastroesophageal reflux disease)   . Allergic rhinitis   . Cigarette nicotine dependence with withdrawal   . DVT (deep venous thrombosis)   . Chronic back pain   . Sciatic pain   . Arthritis   . HIV (human immunodeficiency virus infection)    Past Surgical History  Procedure Laterality Date  . Brain surgery      2013 to remove a meningioma  . Cholecystectomy     Family History  Problem Relation Age of  Onset  . Other Mother     varicose vein  . Asthma Mother   . High blood pressure Mother   . Deep vein thrombosis Neg Hx   . Pulmonary embolism Neg Hx   . Cancer    . Diabetes    . High blood pressure    . Asthma    . Thyroid disease    . Cancer Father    History  Substance Use Topics  . Smoking status: Current Every Day Smoker -- 0.25 packs/day for  18 years    Types: Cigarettes  . Smokeless tobacco: Never Used  . Alcohol Use: No   OB History    Gravida Para Term Preterm AB TAB SAB Ectopic Multiple Living   3 1 1  2 2    1      Review of Systems  Constitutional: Negative for fever and chills.  Eyes: Negative for visual disturbance.  Respiratory: Negative for shortness of breath.   Cardiovascular: Negative for chest pain.  Gastrointestinal: Positive for nausea, abdominal pain and diarrhea (4x/day of loose stool). Negative for vomiting, constipation, blood in stool and anal bleeding.  Genitourinary: Positive for vaginal bleeding and pelvic pain. Negative for dysuria, hematuria, flank pain, vaginal discharge and vaginal pain.  Musculoskeletal: Positive for back pain (radiating from abdomen). Negative for myalgias and arthralgias.  Skin: Negative for color change.  Allergic/Immunologic: Positive for immunocompromised state (HIV).  Neurological: Positive for light-headedness. Negative for dizziness, syncope, weakness, numbness and headaches.  Hematological: Does not bruise/bleed easily (not taking blood thinners).  Psychiatric/Behavioral: Negative for confusion.   10 Systems reviewed and are negative for acute change except as noted in the HPI.    Allergies  Ibuprofen; Clindamycin/lincomycin; Coconut flavor; Flagyl; Latex; Other; and Aspirin  Home Medications   Prior to Admission medications   Medication Sig Start Date End Date Taking? Authorizing Provider  Abacavir-Dolutegravir-Lamivud (TRIUMEQ) 600-50-300 MG TABS Take 1 tablet by mouth daily. 06/23/14   Truman Hayward, MD  acetaminophen (TYLENOL) 500 MG tablet Take 1,000 mg by mouth every 6 (six) hours as needed for moderate pain or headache.    Historical Provider, MD  albuterol (PROVENTIL HFA;VENTOLIN HFA) 108 (90 BASE) MCG/ACT inhaler Inhale 2 puffs into the lungs every 4 (four) hours as needed for wheezing or shortness of breath. 08/02/14   Britt Bottom, NP    albuterol (PROVENTIL) (2.5 MG/3ML) 0.083% nebulizer solution Take 2.5 mg by nebulization every 6 (six) hours as needed for wheezing or shortness of breath.    Historical Provider, MD  alprazolam Duanne Moron) 2 MG tablet Take 2 mg by mouth 3 (three) times daily as needed for sleep.    Historical Provider, MD  beclomethasone (QVAR) 40 MCG/ACT inhaler Inhale 2 puffs into the lungs 2 (two) times daily as needed (for shortness of breath).     Historical Provider, MD  benzocaine (HURRICAINE) 20 % oral spray Apply 1 application topically 3 (three) times daily as needed for pain.    Historical Provider, MD  cyclobenzaprine (FLEXERIL) 10 MG tablet Take 1 tablet (10 mg total) by mouth 2 (two) times daily as needed for muscle spasms. 02/01/13   Larence Penning, MD  diazepam (VALIUM) 10 MG tablet Take 10 mg by mouth every 6 (six) hours as needed for anxiety.    Historical Provider, MD  divalproex (DEPAKOTE) 500 MG DR tablet Take 1 tablet (500 mg total) by mouth 3 (three) times daily. 07/09/13   Harvie Heck, PA-C  divalproex (DEPAKOTE) 500 MG DR  tablet Take 1 tablet (500 mg total) by mouth 3 (three) times daily. Patient not taking: Reported on 08/06/2014 01/06/14   Noemi Chapel, MD  fluticasone [facility]) 50 MCG/ACT nasal spray Place 1 spray into the nose 2 (two) times daily.    Historical Provider, MD  folic acid (FOLVITE) 1 MG tablet Take 1 mg by mouth daily.    Historical Provider, MD  gabapentin (NEURONTIN) 400 MG capsule Take 800 mg by mouth 3 (three) times daily.     Historical Provider, MD  hydrochlorothiazide (HYDRODIURIL) 25 MG tablet Take 1 tablet (25 mg total) by mouth daily. 07/09/13   Harvie Heck, PA-C  HYDROcodone-acetaminophen (NORCO/VICODIN) 5-325 MG per tablet Take 1 tablet by mouth every 4 (four) hours as needed. 08/06/14   Kristen N Ward, DO  levETIRAcetam (KEPPRA) 1000 MG tablet Take 1 tablet (1,000 mg total) by mouth 2 (two) times daily. 07/09/13   Harvie Heck, PA-C  loratadine (CLARITIN) 10 MG tablet  Take 10 mg by mouth daily.  03/03/14   Historical Provider, MD  mirtazapine (REMERON) 15 MG tablet Take 20 mg by mouth at bedtime.     Historical Provider, MD  omeprazole (PRILOSEC) 20 MG capsule Take 20 mg by mouth 2 (two) times daily.    Historical Provider, MD  ondansetron (ZOFRAN ODT) 4 MG disintegrating tablet Take 1 tablet (4 mg total) by mouth every 8 (eight) hours as needed for nausea or vomiting. 08/06/14   Kristen N Ward, DO  oxyCODONE-acetaminophen (PERCOCET) 10-325 MG per tablet Take 1 tablet by mouth every 4 (four) hours as needed for pain.    Historical Provider, MD  PARoxetine (PAXIL) 40 MG tablet Take 1 tablet (40 mg total) by mouth daily. 07/09/13   Lauren Parker, PA-C  Phenylephrine-DM-GG-APAP (MUCINEX FAST-MAX CONGEST COLD) 5-10-200-325 MG TABS Take 2 tablets by mouth 3 (three) times daily. 08/02/14   Britt Bottom, NP  promethazine (PHENERGAN) 6.25 MG/5ML syrup Take 12.5 mg by mouth every 6 (six) hours as needed for nausea or vomiting.    Historical Provider, MD  propranolol (INDERAL) 10 MG tablet Take 10 mg by mouth 2 (two) times daily.  01/28/14   Historical Provider, MD  sodium chloride (OCEAN) 0.65 % nasal spray Place 1 spray into the nose 2 (two) times daily as needed for congestion.     Historical Provider, MD  triamcinolone ointment (KENALOG) 0.5 % Apply 1 application topically 2 (two) times daily.    Historical Provider, MD   BP 118/79 mmHg  Pulse 85  Temp(Src) 98 F (36.7 C) (Oral)  Resp 15  Ht 5\' 5"  (1.651 m)  Wt 250 lb (113.399 kg)  BMI 41.60 kg/m2  SpO2 100%  LMP 08/21/2014   Physical Exam  Constitutional: She is oriented to person, place, and time. Vital signs are normal. She appears well-developed and well-nourished.  Non-toxic appearance. No distress.  Afebrile, nontoxic, NAD. Morbidly obese. VSS  HENT:  Head: Normocephalic and atraumatic.  Mouth/Throat: Oropharynx is clear and moist and mucous membranes are normal.  Eyes: Conjunctivae and EOM are  normal. Right eye exhibits no discharge. Left eye exhibits no discharge.  Neck: Normal range of motion. Neck supple.  Cardiovascular: Normal rate, regular rhythm, normal heart sounds and intact distal pulses.  Exam reveals no gallop and no friction rub.   No murmur heard. RRR, nl s1/s2, no m/r/g, distal pulses intact, no pedal edema   Pulmonary/Chest: Effort normal and breath sounds normal. No respiratory distress. She has no decreased breath sounds. She has  no wheezes. She has no rhonchi. She has no rales.  Abdominal: Soft. Normal appearance and bowel sounds are normal. She exhibits no distension. There is tenderness in the right lower quadrant, suprapubic area and left lower quadrant. There is no rigidity, no rebound, no guarding, no CVA tenderness, no tenderness at McBurney's point and negative Murphy's sign.    Soft, morbidly obese which limits exam but no obvious distension, +BS throughout, with diffuse lower abd TTP along pelvic brim, no r/g/r, neg murphy's, neg mcburney's, no CVA TTP   Genitourinary: Pelvic exam was performed with patient supine. There is no rash, tenderness or lesion on the right labia. There is no rash, tenderness or lesion on the left labia. Uterus is tender. Uterus is not deviated, not enlarged and not fixed. Cervix exhibits no motion tenderness, no discharge and no friability. Right adnexum displays tenderness. Right adnexum displays no mass and no fullness. Left adnexum displays tenderness. Left adnexum displays no mass and no fullness. There is bleeding in the vagina. No erythema or tenderness in the vagina. No vaginal discharge found.  Chaperone present for exam. No rashes, lesions, or tenderness to external genitalia. No erythema, injury, or tenderness to vaginal mucosa. No vaginal discharge, dark red vaginal bleeding within vaginal vault coming from closed cervical os, no clots identified. No adnexal masses or fullness but diffuse adnexal and uterine TTP. No CMT,  cervical friability, or discharge from cervical os, only the vaginal bleeding from os as noted above. Uterus non-deviated, mobile, and without enlargement although body habitus inhibits adequate palpation.    Musculoskeletal: Normal range of motion.  MAE x4 Strength and sensation grossly intact Distal pulses intact No pedal edema  Neurological: She is alert and oriented to person, place, and time. She has normal strength. No cranial nerve deficit or sensory deficit. GCS eye subscore is 4. GCS verbal subscore is 5. GCS motor subscore is 6.  CN 2-12 grossly intact A&O x4 GCS 15 Sensation and strength intact Coordination WNL  Skin: Skin is warm, dry and intact. No rash noted.  Psychiatric: She has a normal mood and affect.  Nursing note and vitals reviewed.   ED Course  Procedures (including critical care time)  10:26:24 Orthostatic Vital Signs HD  Orthostatic Lying  - BP- Lying: 122/73 mmHg ; Pulse- Lying: 82  Orthostatic Sitting - BP- Sitting: 130/82 mmHg ; Pulse- Sitting: 83  Orthostatic Standing at 0 minutes - BP- Standing at 0 minutes: 144/107 mmHg ; Pulse- Standing at 0 minutes: 107      Labs Review Labs Reviewed  WET PREP, GENITAL - Abnormal; Notable for the following:    Clue Cells Wet Prep HPF POC MANY (*)    WBC, Wet Prep HPF POC FEW (*)    All other components within normal limits  CBC WITH DIFFERENTIAL/PLATELET - Abnormal; Notable for the following:    Neutrophils Relative % 37 (*)    Lymphocytes Relative 54 (*)    All other components within normal limits  COMPREHENSIVE METABOLIC PANEL - Abnormal; Notable for the following:    Glucose, Bld 104 (*)    BUN 5 (*)    ALT 12 (*)    All other components within normal limits  URINALYSIS, ROUTINE W REFLEX MICROSCOPIC - Abnormal; Notable for the following:    Hgb urine dipstick LARGE (*)    All other components within normal limits  URINE MICROSCOPIC-ADD ON - Abnormal; Notable for the following:    Squamous Epithelial /  LPF MANY (*)  Bacteria, UA FEW (*)    All other components within normal limits  LIPASE, BLOOD  RPR  I-STAT TROPOININ, ED  POC URINE PREG, ED  GC/CHLAMYDIA PROBE AMP ([place])    Imaging Review US Transvaginal Non-ob  08/30/2014   CLINICAL DATA:  Pelvic pain.  Vaginal bleeding.  EXAM: TRANSABDOMINAL AND TRANSVAGINAL ULTRASOUND OF PELVIS  DOPPLER ULTRASOUND OF OVARIES  TECHNIQUE: Both transabdominal and transvaginal ultrasound examinations of the pelvis were performed. Transabdominal technique was performed for global imaging of the pelvis including uterus, ovaries, adnexal regions, and pelvic cul-de-sac.  It was necessary to proceed with endovaginal exam following the transabdominal exam to visualize the endometrium and ovaries. Color and duplex Doppler ultrasound was utilized to evaluate blood flow to the ovaries.  COMPARISON:  None.  FINDINGS: Uterus  Measurements: 8.2 x 5.4 x 6.4 cm. There are 2 hypoechoic uterine masses. The smaller hypoechoic mass measures 1.5 x 0.9 x 1 cm along the right side of the uterus. There is a 3.9 x 4.3 x 3.9 cm hypoechoic left uterine mass most consistent with a fibroid.  Endometrium  Thickness: 3.2 mm.  No focal abnormality visualized.  Right ovary  Measurements: 4.4 x 3.6 x 3.4 cm. There is a 3.4 cm hypoechoic right ovarian mass with internal echoes and a few thin internal septations likely representing a hemorrhagic cyst or endometrioma.  Left ovary  Measurements: 2.6 x 1.9 x 2.3 cm. Normal appearance/no adnexal mass.  Pulsed Doppler evaluation of both ovaries demonstrates normal low-resistance arterial and venous waveforms.  Other findings  No free fluid.  IMPRESSION: 1. No ovarian torsion. 2. There is a 3.4 cm hypoechoic right ovarian mass with internal echoes and a few thin internal septations likely representing a hemorrhagic cyst or endometrioma. If there is further clinical concern, a follow-up pelvic ultrasound can be performed in 6-8 weeks. 3. Uterine  fibroids.   Electronically Signed   By: Kathreen Devoid   On: 08/30/2014 13:06   US Pelvis Complete  08/30/2014   CLINICAL DATA:  Pelvic pain.  Vaginal bleeding.  EXAM: TRANSABDOMINAL AND TRANSVAGINAL ULTRASOUND OF PELVIS  DOPPLER ULTRASOUND OF OVARIES  TECHNIQUE: Both transabdominal and transvaginal ultrasound examinations of the pelvis were performed. Transabdominal technique was performed for global imaging of the pelvis including uterus, ovaries, adnexal regions, and pelvic cul-de-sac.  It was necessary to proceed with endovaginal exam following the transabdominal exam to visualize the endometrium and ovaries. Color and duplex Doppler ultrasound was utilized to evaluate blood flow to the ovaries.  COMPARISON:  None.  FINDINGS: Uterus  Measurements: 8.2 x 5.4 x 6.4 cm. There are 2 hypoechoic uterine masses. The smaller hypoechoic mass measures 1.5 x 0.9 x 1 cm along the right side of the uterus. There is a 3.9 x 4.3 x 3.9 cm hypoechoic left uterine mass most consistent with a fibroid.  Endometrium  Thickness: 3.2 mm.  No focal abnormality visualized.  Right ovary  Measurements: 4.4 x 3.6 x 3.4 cm. There is a 3.4 cm hypoechoic right ovarian mass with internal echoes and a few thin internal septations likely representing a hemorrhagic cyst or endometrioma.  Left ovary  Measurements: 2.6 x 1.9 x 2.3 cm. Normal appearance/no adnexal mass.  Pulsed Doppler evaluation of both ovaries demonstrates normal low-resistance arterial and venous waveforms.  Other findings  No free fluid.  IMPRESSION: 1. No ovarian torsion. 2. There is a 3.4 cm hypoechoic right ovarian mass with internal echoes and a few thin internal septations likely representing a hemorrhagic  cyst or endometrioma. If there is further clinical concern, a follow-up pelvic ultrasound can be performed in 6-8 weeks. 3. Uterine fibroids.   Electronically Signed   By: Kathreen Devoid   On: 08/30/2014 13:06   [place] Art/ven Flow Abd Pelv Doppler  08/30/2014   CLINICAL  DATA:  Pelvic pain.  Vaginal bleeding.  EXAM: TRANSABDOMINAL AND TRANSVAGINAL ULTRASOUND OF PELVIS  DOPPLER ULTRASOUND OF OVARIES  TECHNIQUE: Both transabdominal and transvaginal ultrasound examinations of the pelvis were performed. Transabdominal technique was performed for global imaging of the pelvis including uterus, ovaries, adnexal regions, and pelvic cul-de-sac.  It was necessary to proceed with endovaginal exam following the transabdominal exam to visualize the endometrium and ovaries. Color and duplex Doppler ultrasound was utilized to evaluate blood flow to the ovaries.  COMPARISON:  None.  FINDINGS: Uterus  Measurements: 8.2 x 5.4 x 6.4 cm. There are 2 hypoechoic uterine masses. The smaller hypoechoic mass measures 1.5 x 0.9 x 1 cm along the right side of the uterus. There is a 3.9 x 4.3 x 3.9 cm hypoechoic left uterine mass most consistent with a fibroid.  Endometrium  Thickness: 3.2 mm.  No focal abnormality visualized.  Right ovary  Measurements: 4.4 x 3.6 x 3.4 cm. There is a 3.4 cm hypoechoic right ovarian mass with internal echoes and a few thin internal septations likely representing a hemorrhagic cyst or endometrioma.  Left ovary  Measurements: 2.6 x 1.9 x 2.3 cm. Normal appearance/no adnexal mass.  Pulsed Doppler evaluation of both ovaries demonstrates normal low-resistance arterial and venous waveforms.  Other findings  No free fluid.  IMPRESSION: 1. No ovarian torsion. 2. There is a 3.4 cm hypoechoic right ovarian mass with internal echoes and a few thin internal septations likely representing a hemorrhagic cyst or endometrioma. If there is further clinical concern, a follow-up pelvic ultrasound can be performed in 6-8 weeks. 3. Uterine fibroids.   Electronically Signed   By: Kathreen Devoid   On: 08/30/2014 13:06   US Transvaginal Non-ob/Us Pelvis Complete/Us Art/ven Flow Abd Pelv Doppler 12/12/2013  CLINICAL DATA: Vaginal bleeding and pain EXAM: TRANSABDOMINAL AND TRANSVAGINAL ULTRASOUND  OF PELVIS DOPPLER ULTRASOUND OF OVARIES TECHNIQUE: Both transabdominal and transvaginal ultrasound examinations of the pelvis were performed. Transabdominal technique was performed for global imaging of the pelvis including uterus, ovaries, adnexal regions, and pelvic cul-de-sac. It was necessary to proceed with endovaginal exam following the transabdominal exam to visualize the ovaries. Color and duplex Doppler ultrasound was utilized to evaluate blood flow to the ovaries. COMPARISON: None. FINDINGS: Uterus Measurements: 6.9 x 5.1 x 5.4 cm. Fibroid change is noted. The largest of these measures 4.0 cm in greatest dimension. Endometrium Thickness: 6.8 mm. No focal abnormality visualized. Right ovary Measurements: 2.6 x 1.7 x 2.1 cm. Normal appearance/no adnexal mass. Left ovary Measurements: 2.2 x 1.8 x 2.2 cm. Normal appearance/no adnexal mass. Pulsed Doppler evaluation of both ovaries demonstrates normal low resistant arterial venous flow within the right ovary. Somewhat dampened waveforms are noted within the left ovary. Other findings No free fluid. IMPRESSION: Slight decrease in the arterial venous waveforms within the left ovary although a definitive torsion cannot be diagnosed. Correlation with the patient's clinical history is recommended. Fibroid disease Electronically Signed By: Inez Catalina M.D. On: 12/12/2013 14:36       EKG Interpretation   Date/Time:  Tuesday Aug 30 2014 10:34:37 EDT Ventricular Rate:  81 PR Interval:  155 QRS Duration: 82 QT Interval:  363 QTC Calculation: 421 R Axis:  48 Text Interpretation:  Sinus rhythm Low voltage, precordial leads No  significant change since last tracing Confirmed by KNAPP  MD-J, JON  ([id number]) on 08/30/2014 10:43:49 AM      MDM   Final diagnoses:  Pelvic pain in female  Uterine leiomyoma, unspecified location  Cyst of right ovary  BV (bacterial vaginosis)  HIV disease  Morbid obesity  Orthostatic  lightheadedness  Abnormal uterine bleeding    39 y.o. female here with vaginal bleeding x11 days, lightheadedness, nausea, and some loose stools. Nonfocal neuro exam, pt overall appears stable. Abd exam nonperitoneal with diffuse lower abd tenderness, obese abdomen does limit exam somewhat. No specific CVA TTP. Chart review reveals she's had evaluation for abd pain/vaginal bleeding in the past and was diagnosed with uterine fibroids. Sexually active with females only. HIV+ on antiretrovirals and compliant with following up with Dr. Tommy Medal. No urinary symptoms. Will perform pelvic exam and decide if U/S vs CT is warranted. DDx wide at this time, will hopefully narrow it down with pelvic and labs. Will give pain meds and nausea meds. Will get orthostatic VS and give fluids. Will also get EKG and trop given her lightheadedness. Will reassess shortly.   10:57 AM Pelvic revealing dark red blood coming from closed cervical os, no clots noted, diffuse pelvic tenderness, no specific CMT or vaginal discharge noted. Since pt follows closely with ID, doubt need for empiric tx of GC/CT today, will await results. Will proceed with U/S pelvis. Orthostatic VS show some tachycardic with standing, fluids running. EKG unremarkable. Will reassess shortly.  12:45 PM Wet prep showing many clue cells with few WBCs. Will likely treat with metrogel since pt is intolerant of PO flagyl. Upreg neg. U/A contaminated but doesn't appear to be grossly infected. Trop neg. CBC w/diff unremarkable. CMP unremarkable. Lipase WNL. U/S still pending, will evaluate after these result.  1:59 PM U/S showing uterine fibroids and R ovarian cyst vs endometrioma. VSS throughout ED stay, pain returning now will give another dose of morphine before she leaves. Given these findings, likely source of AUB is fibroids and/or cyst. She has an appt with OBGYN on 5/23. Will start on mefenamic acid x4 days, discussed that she needs to stay hydrated to  avoid nephrotoxic issues since she's on antiretrovirals. Will also give metrogel. Discussed tylenol as needed for pain, and heat therapy. Tolerating PO well. Will have her f/up with Dr. Tommy Medal in 1 wk to recheck kidney function after meds. I explained the diagnosis and have given explicit precautions to return to the ER including for any other new or worsening symptoms. The patient understands and accepts the medical plan as it's been dictated and I have answered their questions. Discharge instructions concerning home care and prescriptions have been given. The patient is STABLE and is discharged to home in good condition.  BP 124/74 mmHg  Pulse 73  Temp(Src) 98 F (36.7 C) (Oral)  Resp 19  Ht 5\' 5"  (1.651 m)  Wt 250 lb (113.399 kg)  BMI 41.60 kg/m2  SpO2 98%  LMP 08/21/2014  Meds ordered this encounter  Medications  . sodium chloride 0.9 % bolus 1,000 mL    Sig:   . ondansetron (ZOFRAN) injection 4 mg    Sig:   . morphine 2 MG/ML injection 2 mg    Sig:   . morphine 2 MG/ML injection 2 mg    Sig:   . Mefenamic Acid 250 MG CAPS    Sig: Take 2 capsules (500  mg total) by mouth 3 (three) times daily. X 4 days    Dispense:  28 each    Refill:  0    Order Specific Question:  Supervising Provider    Answer:  MILLER, BRIAN [[id number]]  . metroNIDAZOLE (METROGEL VAGINAL) 0.75 % vaginal gel    Sig: Place 1 Applicatorful vaginally 2 (two) times daily. X 5 days    Dispense:  70 g    Refill:  0    Order Specific Question:  Supervising Provider    Answer:  Noemi Chapel [[id number]]     Silver Parkey Camprubi-Soms, PA-C 08/30/14 1402  Dorie Rank, MD 08/30/14 1428

## 2014-08-30 NOTE — ED Notes (Signed)
Pt has had vaginal bleeding bleeding for 11 days. Pt states 6-7 pads/tampons per day. Passing large clots. Pt states she is having sharp/cramping pain in lower abd and feeling weak.

## 2014-08-30 NOTE — Discharge Instructions (Signed)
Your abdominal pain and vaginal bleeding was evaluated today, and it's likely due to uterine fibroids or an ovarian cyst. You must follow up with your gynecologist for ongoing management in the next 1 week. Use mefenamic acid as directed to help resolve your symptoms. Your vaginal swab found bacterial vaginosis, use metrogel as directed for this. Stay well hydrated. Follow up with Dr. Tommy Medal to recheck your kidney function after you finish your pills. Return to the ER for changes or worsening of symptoms.  Abdominal (belly) pain can be caused by many things. Your caregiver performed an examination and possibly ordered blood/urine tests and imaging (CT scan, x-rays, ultrasound). Many cases can be observed and treated at home after initial evaluation in the emergency department. Even though you are being discharged home, abdominal pain can be unpredictable. Therefore, you need a repeated exam if your pain does not resolve, returns, or worsens. Most patients with abdominal pain don't have to be admitted to the hospital or have surgery, but serious problems like appendicitis and gallbladder attacks can start out as nonspecific pain. Many abdominal conditions cannot be diagnosed in one visit, so follow-up evaluations are very important. SEEK IMMEDIATE MEDICAL ATTENTION IF YOU DEVELOP ANY OF THE FOLLOWING SYMPTOMS:  The pain does not go away or becomes severe.   A temperature above 101 develops.   Repeated vomiting occurs (multiple episodes).   The pain becomes localized to portions of the abdomen. The right side could possibly be appendicitis. In an adult, the left lower portion of the abdomen could be colitis or diverticulitis.   Blood is being passed in stools or vomit (bright red or black tarry stools).   Return also if you develop chest pain, difficulty breathing, dizziness or fainting, or become confused, poorly responsive, or inconsolable (young children).  The constipation stays for more than 4  days.   There is belly (abdominal) or rectal pain.   You do not seem to be getting better.    Abdominal Pain, Women Abdominal (stomach, pelvic, or belly) pain can be caused by many things. It is important to tell your doctor:  The location of the pain.  Does it come and go or is it present all the time?  Are there things that start the pain (eating certain foods, exercise)?  Are there other symptoms associated with the pain (fever, nausea, vomiting, diarrhea)? All of this is helpful to know when trying to find the cause of the pain. CAUSES   Stomach: virus or bacteria infection, or ulcer.  Intestine: appendicitis (inflamed appendix), regional ileitis (Crohn's disease), ulcerative colitis (inflamed colon), irritable bowel syndrome, diverticulitis (inflamed diverticulum of the colon), or cancer of the stomach or intestine.  Gallbladder disease or stones in the gallbladder.  Kidney disease, kidney stones, or infection.  Pancreas infection or cancer.  Fibromyalgia (pain disorder).  Diseases of the female organs:  Uterus: fibroid (non-cancerous) tumors or infection.  Fallopian tubes: infection or tubal pregnancy.  Ovary: cysts or tumors.  Pelvic adhesions (scar tissue).  Endometriosis (uterus lining tissue growing in the pelvis and on the pelvic organs).  Pelvic congestion syndrome (female organs filling up with blood just before the menstrual period).  Pain with the menstrual period.  Pain with ovulation (producing an egg).  Pain with an IUD (intrauterine device, birth control) in the uterus.  Cancer of the female organs.  Functional pain (pain not caused by a disease, may improve without treatment).  Psychological pain.  Depression. DIAGNOSIS  Your doctor will decide the  seriousness of your pain by doing an examination.  Blood tests.  X-rays.  Ultrasound.  CT scan (computed tomography, special type of X-ray).  MRI (magnetic resonance  imaging).  Cultures, for infection.  Barium enema (dye inserted in the large intestine, to better view it with X-rays).  Colonoscopy (looking in intestine with a lighted tube).  Laparoscopy (minor surgery, looking in abdomen with a lighted tube).  Major abdominal exploratory surgery (looking in abdomen with a large incision). TREATMENT  The treatment will depend on the cause of the pain.   Many cases can be observed and treated at home.  Over-the-counter medicines recommended by your caregiver.  Prescription medicine.  Antibiotics, for infection.  Birth control pills, for painful periods or for ovulation pain.  Hormone treatment, for endometriosis.  Nerve blocking injections.  Physical therapy.  Antidepressants.  Counseling with a psychologist or psychiatrist.  Minor or major surgery. HOME CARE INSTRUCTIONS   Do not take laxatives, unless directed by your caregiver.  Take over-the-counter pain medicine only if ordered by your caregiver. Do not take aspirin because it can cause an upset stomach or bleeding.  Try a clear liquid diet (broth or water) as ordered by your caregiver. Slowly move to a bland diet, as tolerated, if the pain is related to the stomach or intestine.  Have a thermometer and take your temperature several times a day, and record it.  Bed rest and sleep, if it helps the pain.  Avoid sexual intercourse, if it causes pain.  Avoid stressful situations.  Keep your follow-up appointments and tests, as your caregiver orders.  If the pain does not go away with medicine or surgery, you may try:  Acupuncture.  Relaxation exercises (yoga, meditation).  Group therapy.  Counseling. SEEK MEDICAL CARE IF:   You notice certain foods cause stomach pain.  Your home care treatment is not helping your pain.  You need stronger pain medicine.  You want your IUD removed.  You feel faint or lightheaded.  You develop nausea and vomiting.  You  develop a rash.  You are having side effects or an allergy to your medicine. SEEK IMMEDIATE MEDICAL CARE IF:   Your pain does not go away or gets worse.  You have a fever.  Your pain is felt only in portions of the abdomen. The right side could possibly be appendicitis. The left lower portion of the abdomen could be colitis or diverticulitis.  You are passing blood in your stools (bright red or black tarry stools, with or without vomiting).  You have blood in your urine.  You develop chills, with or without a fever.  You pass out. MAKE SURE YOU:   Understand these instructions.  Will watch your condition.  Will get help right away if you are not doing well or get worse. Document Released: 01/27/2007 Document Revised: 08/16/2013 Document Reviewed: 02/16/2009 [facility] Patient Information 2015 [place], [place]. This information is not intended to replace advice given to you by your health care provider. Make sure you discuss any questions you have with your health care provider.  Abnormal Uterine Bleeding Abnormal uterine bleeding can affect women at various stages in life, including teenagers, women in their reproductive years, pregnant women, and women who have reached menopause. Several kinds of uterine bleeding are considered abnormal, including:  Bleeding or spotting between periods.   Bleeding after sexual intercourse.   Bleeding that is heavier or more than normal.   Periods that last longer than usual.  Bleeding after menopause.  Many cases of abnormal uterine bleeding are minor and simple to treat, while others are more serious. Any type of abnormal bleeding should be evaluated by your health care provider. Treatment will depend on the cause of the bleeding. HOME CARE INSTRUCTIONS Monitor your condition for any changes. The following actions may help to alleviate any discomfort you are experiencing:  Avoid the use of tampons and douches as directed by your health  care provider.  Change your pads frequently. You should get regular pelvic exams and Pap tests. Keep all follow-up appointments for diagnostic tests as directed by your health care provider.  SEEK MEDICAL CARE IF:   Your bleeding lasts more than 1 week.   You feel dizzy at times.  SEEK IMMEDIATE MEDICAL CARE IF:   You pass out.   You are changing pads every 15 to 30 minutes.   You have abdominal pain.  You have a fever.   You become sweaty or weak.   You are passing large blood clots from the vagina.   You start to feel nauseous and vomit. MAKE SURE YOU:   Understand these instructions.  Will watch your condition.  Will get help right away if you are not doing well or get worse. Document Released: 04/01/2005 Document Revised: 04/06/2013 Document Reviewed: 10/29/2012 [facility] Patient Information 2015 [place], [place]. This information is not intended to replace advice given to you by your health care provider. Make sure you discuss any questions you have with your health care provider.  Bacterial Vaginosis Bacterial vaginosis is an infection of the vagina. It happens when too many of certain germs (bacteria) grow in the vagina. HOME CARE  Take your medicine as told by your doctor.  Finish your medicine even if you start to feel better.  Do not have sex until you finish your medicine and are better.  Tell your sex partner that you have an infection. They should see their doctor for treatment.  Practice safe sex. Use condoms. Have only one sex partner. GET HELP IF:  You are not getting better after 3 days of treatment.  You have more grey fluid (discharge) coming from your vagina than before.  You have more pain than before.  You have a fever. MAKE SURE YOU:   Understand these instructions.  Will watch your condition.  Will get help right away if you are not doing well or get worse. Document Released: 01/09/2008 Document Revised: 01/20/2013 Document  Reviewed: 11/11/2012 [facility] Patient Information 2015 [place], [place]. This information is not intended to replace advice given to you by your health care provider. Make sure you discuss any questions you have with your health care provider.  Fibroids Fibroids are lumps (tumors) that can occur any place in a woman's body. These lumps are not cancerous. Fibroids vary in size, weight, and where they grow. HOME CARE  Do not take aspirin.  Write down the number of pads or tampons you use during your period. Tell your doctor. This can help determine the best treatment for you. GET HELP RIGHT AWAY IF:  You have pain in your lower belly (abdomen) that is not helped with medicine.  You have cramps that are not helped with medicine.  You have more bleeding between or during your period.  You feel lightheaded or pass out (faint).  Your lower belly pain gets worse. MAKE SURE YOU:  Understand these instructions.  Will watch your condition.  Will get help right away if you are not doing well or get worse.  Document Released: 05/04/2010 Document Revised: 06/24/2011 Document Reviewed: 05/04/2010 [facility] Patient Information 2015 [place], [place]. This information is not intended to replace advice given to you by your health care provider. Make sure you discuss any questions you have with your health care provider.  Menorrhagia Menorrhagia is when your menstrual periods are heavy or last longer than usual.  HOME CARE  Only take medicine as told by your doctor.  Take any iron pills as told by your doctor. Heavy bleeding may cause low levels of iron in your body.  Do not take aspirin 1 week before or during your period. Aspirin can make the bleeding worse.  Lie down for a while if you change your tampon or pad more than once in 2 hours. This may help lessen the bleeding.  Eat a healthy diet and foods with iron. These foods include leafy green vegetables, meat, liver, eggs, and whole grain  breads and cereals.  Do not try to lose weight. Wait until the heavy bleeding has stopped and your iron level is normal. GET HELP IF:  You soak through a pad or tampon every 1 or 2 hours, and this happens every time you have a period.  You need to use pads and tampons at the same time because you are bleeding so much.  You need to change your pad or tampon during the night.  You have a period that lasts for more than 8 days.  You pass clots bigger than 1 inch (2.5 cm) wide.  You have irregular periods that happen more or less often than once a month.  You feel dizzy or pass out (faint).  You feel very weak or tired.  You feel short of breath or feel your heart is beating too fast when you exercise.  You feel sick to your stomach (nausea) and you throw up (vomit) while you are taking your medicine.   You have watery poop (diarrhea) while you are taking your medicine.  You have any problems that may be related to the medicine you are taking.  GET HELP RIGHT AWAY IF:  You soak through 4 or more pads or tampons in 2 hours.  You have any bleeding while you are pregnant. MAKE SURE YOU:   Understand these instructions.  Will watch your condition.  Will get help right away if you are not doing well or get worse. Document Released: 01/09/2008 Document Revised: 12/02/2012 Document Reviewed: 10/01/2012 [facility] Patient Information 2015 [place], [place]. This information is not intended to replace advice given to you by your health care provider. Make sure you discuss any questions you have with your health care provider.  Metrorrhagia  Metrorrhagia is uterine bleeding at irregular intervals, especially between menstrual periods.  CAUSES   Dysfunctional uterine bleeding.  Uterine lining growing outside the uterus (endometriosis).  Embryo adhering to uterine wall (implantation).  Pregnancy growing in the fallopian tubes (ectopic  pregnancy).  Miscarriage.  Menopause.  Cancer of the reproduction organs.  Certain drugs such as hormonal contraceptives.  Inherited bleeding disorders.  Trauma.  Uterine fibroids.  Sexually transmitted diseases (STDs).  Polycystic ovarian disease. DIAGNOSIS  A history will be taken.  A physical exam will be performed.  Other tests may include:  Blood tests.  A pregnancy test.  An ultrasound of the abdomen and pelvis.  A biopsy of the uterine lining.  AMRI or CT scan of the abdomen and pelvis. TREATMENT Treatment will depend on the cause. HOME CARE INSTRUCTIONS   Take all medicines as  directed by your caregiver. Do not change or switch medicines without talking to your caregiver.  Take all iron supplements exactly as directed by your caregiver. Iron supplements help to replace the iron your body loses from irregular bleeding.If you become constipated, increase the amount of fiber, fruits, and vegetables in your diet.  Do not take aspirin or medicines that contain aspirin for 1 week before your menstrual period or during your menstrual period. Aspirin may increase the bleeding.  Rest as much as possible if you change your sanitary pad or tampon more than once every 2 hours.  Eat well-balanced meals including foods high in iron, such as green leafy vegetables, red meat, liver, eggs, and whole-grain breads and cereals.  Do not try to lose weight until the abnormal bleeding is controlled and your blood iron level is back to normal. SEEK MEDICAL CARE IF:   You have nausea and vomiting, or you cannot keep foods down.  You feel dizzy or have diarrhea while taking medicine.  You have any problems that may be related to the medicine you are taking. SEEK IMMEDIATE MEDICAL CARE IF:   You have a fever.  You develop chills.  You become lightheaded or faint.  You need to change your sanitary pad or tampon more than once an hour.  Your bleeding  becomesheavy.  You begin to pass clots or tissue. MAKE SURE YOU:   Understand these instructions.  Will watch your condition.  Will get help right away if you are not doing well or get worse. Document Released: 04/01/2005 Document Revised: 06/24/2011 Document Reviewed: 10/29/2010 [facility] Patient Information 2015 [place], [place]. This information is not intended to replace advice given to you by your health care provider. Make sure you discuss any questions you have with your health care provider.  Orthostatic Hypotension Orthostatic hypotension is a sudden drop in blood pressure. It happens when you quickly stand up from a seated or lying position. You may feel dizzy or light-headed. This can last for just a few seconds or for up to a few minutes. It is usually not a serious problem. However, if this happens frequently or gets worse, it can be a sign of something more serious. CAUSES  Different things can cause orthostatic hypotension, including:   Loss of body fluids (dehydration).  Medicines that lower blood pressure.  Sudden changes in posture, such as standing up quickly after you have been sitting or lying down.  Taking too much of your medicine. SIGNS AND SYMPTOMS   Light-headedness or dizziness.   Fainting or near-fainting.   A fast heart rate.   Weakness.   Feeling tired (fatigue).  DIAGNOSIS  Your health care provider may do several things to help diagnose your condition and identify the cause. These may include:   Taking a medical history and doing a physical exam.  Checking your blood pressure. Your health care provider will check your blood pressure when you are:  Lying down.  Sitting.  Standing.  Using tilt table testing. In this test, you lie down on a table that moves from a lying position to a standing position. You will be strapped onto the table. This test monitors your blood pressure and heart rate when you are in different  positions. TREATMENT  Treatment will vary depending on the cause. Possible treatments include:   Changing the dosage of your medicines.  Wearing compression stockings on your lower legs.  Standing up slowly after sitting or lying down.  Eating more salt.  Eating  frequent, small meals.  In some cases, getting IV fluids.  Taking medicine to enhance fluid retention. HOME CARE INSTRUCTIONS  Only take over-the-counter or prescription medicines as directed by your health care provider.  Follow your health care provider's instructions for changing the dosage of your current medicines.  Do not stop or adjust your medicine on your own.  Stand up slowly after sitting or lying down. This allows your body to adjust to the different position.  Wear compression stockings as directed.  Eat extra salt as directed.  Do not add extra salt to your diet unless directed to by your health care provider.  Eat frequent, small meals.  Avoid standing suddenly after eating.  Avoid hot showers or excessive heat as directed by your health care provider.  Keep all follow-up appointments. SEEK MEDICAL CARE IF:  You continue to feel dizzy or light-headed after standing.  You feel groggy or confused.  You feel cold, clammy, or sick to your stomach (nauseous).  You have blurred vision.  You feel short of breath. SEEK IMMEDIATE MEDICAL CARE IF:   You faint after standing.  You have chest pain.  You have difficulty breathing.   You lose feeling or movement in your arms or legs.   You have slurred speech or difficulty talking, or you are unable to talk.  MAKE SURE YOU:   Understand these instructions.  Will watch your condition.  Will get help right away if you are not doing well or get worse. Document Released: 03/22/2002 Document Revised: 04/06/2013 Document Reviewed: 01/22/2013 [facility] Patient Information 2015 [place], [place]. This information is not intended to replace  advice given to you by your health care provider. Make sure you discuss any questions you have with your health care provider.  Ovarian Cyst An ovarian cyst is a sac filled with fluid or blood. This sac is attached to the ovary. Some cysts go away on their own. Other cysts need treatment.  HOME CARE   Only take medicine as told by your doctor.  Follow up with your doctor as told.  Get regular pelvic exams and Pap tests. GET HELP IF:  Your periods are late, not regular, or painful.  You stop having periods.  Your belly (abdominal) or pelvic pain does not go away.  Your belly becomes large or puffy (swollen).  You have a hard time peeing (totally emptying your bladder).  You have pressure on your bladder.  You have pain during sex.  You feel fullness, pressure, or discomfort in your belly.  You lose weight for no reason.  You feel sick most of the time.  You have a hard time pooping (constipation).  You do not feel like eating.  You develop pimples (acne).  You have an increase in hair on your body and face.  You are gaining weight for no reason.  You think you are pregnant. GET HELP RIGHT AWAY IF:   Your belly pain gets worse.  You feel sick to your stomach (nauseous), and you throw up (vomit).  You have a fever that comes on fast.  You have belly pain while pooping (bowel movement).  Your periods are heavier than usual. MAKE SURE YOU:   Understand these instructions.  Will watch your condition.  Will get help right away if you are not doing well or get worse. Document Released: 09/18/2007 Document Revised: 01/20/2013 Document Reviewed: 12/07/2012 [facility] Patient Information 2015 [place], [place]. This information is not intended to replace advice given to you by your  health care provider. Make sure you discuss any questions you have with your health care provider. ° °

## 2014-08-31 LAB — GC/CHLAMYDIA PROBE AMP (~~LOC~~) NOT AT ARMC
CHLAMYDIA, DNA PROBE: NEGATIVE
NEISSERIA GONORRHEA: NEGATIVE

## 2014-08-31 LAB — RPR: RPR Ser Ql: NONREACTIVE

## 2014-09-30 ENCOUNTER — Emergency Department (HOSPITAL_COMMUNITY)
Admission: EM | Admit: 2014-09-30 | Discharge: 2014-09-30 | Disposition: A | Discharge status: Home / Self Care | Payer: Medicaid Other | Attending: Emergency Medicine | Admitting: Emergency Medicine

## 2014-09-30 ENCOUNTER — Encounter (HOSPITAL_COMMUNITY): Payer: Self-pay | Admitting: Family Medicine

## 2014-09-30 DIAGNOSIS — Z86718 Personal history of other venous thrombosis and embolism: Secondary | ICD-10-CM | POA: Insufficient documentation

## 2014-09-30 DIAGNOSIS — G8929 Other chronic pain: Secondary | ICD-10-CM | POA: Diagnosis not present

## 2014-09-30 DIAGNOSIS — M1990 Unspecified osteoarthritis, unspecified site: Secondary | ICD-10-CM | POA: Insufficient documentation

## 2014-09-30 DIAGNOSIS — Z3202 Encounter for pregnancy test, result negative: Secondary | ICD-10-CM | POA: Insufficient documentation

## 2014-09-30 DIAGNOSIS — E1165 Type 2 diabetes mellitus with hyperglycemia: Secondary | ICD-10-CM | POA: Diagnosis not present

## 2014-09-30 DIAGNOSIS — I10 Essential (primary) hypertension: Secondary | ICD-10-CM | POA: Insufficient documentation

## 2014-09-30 DIAGNOSIS — Z79899 Other long term (current) drug therapy: Secondary | ICD-10-CM | POA: Insufficient documentation

## 2014-09-30 DIAGNOSIS — K219 Gastro-esophageal reflux disease without esophagitis: Secondary | ICD-10-CM | POA: Insufficient documentation

## 2014-09-30 DIAGNOSIS — Z720 Tobacco use: Secondary | ICD-10-CM | POA: Diagnosis not present

## 2014-09-30 DIAGNOSIS — R358 Other polyuria: Secondary | ICD-10-CM | POA: Insufficient documentation

## 2014-09-30 DIAGNOSIS — Z21 Asymptomatic human immunodeficiency virus [HIV] infection status: Secondary | ICD-10-CM | POA: Insufficient documentation

## 2014-09-30 DIAGNOSIS — Z86018 Personal history of other benign neoplasm: Secondary | ICD-10-CM | POA: Insufficient documentation

## 2014-09-30 DIAGNOSIS — Z7951 Long term (current) use of inhaled steroids: Secondary | ICD-10-CM | POA: Diagnosis not present

## 2014-09-30 DIAGNOSIS — R739 Hyperglycemia, unspecified: Secondary | ICD-10-CM

## 2014-09-30 DIAGNOSIS — G40409 Other generalized epilepsy and epileptic syndromes, not intractable, without status epilepticus: Secondary | ICD-10-CM | POA: Insufficient documentation

## 2014-09-30 DIAGNOSIS — R631 Polydipsia: Secondary | ICD-10-CM | POA: Diagnosis not present

## 2014-09-30 DIAGNOSIS — Z91040 Latex allergy status: Secondary | ICD-10-CM | POA: Diagnosis not present

## 2014-09-30 DIAGNOSIS — J45909 Unspecified asthma, uncomplicated: Secondary | ICD-10-CM | POA: Insufficient documentation

## 2014-09-30 LAB — URINALYSIS, ROUTINE W REFLEX MICROSCOPIC
GLUCOSE, UA: NEGATIVE mg/dL
KETONES UR: 15 mg/dL — AB
Leukocytes, UA: NEGATIVE
NITRITE: NEGATIVE
PH: 5.5 (ref 5.0–8.0)
Protein, ur: NEGATIVE mg/dL
SPECIFIC GRAVITY, URINE: 1.029 (ref 1.005–1.030)
Urobilinogen, UA: 1 mg/dL (ref 0.0–1.0)

## 2014-09-30 LAB — CBG MONITORING, ED: Glucose-Capillary: 99 mg/dL (ref 65–99)

## 2014-09-30 LAB — COMPREHENSIVE METABOLIC PANEL
ALK PHOS: 57 U/L (ref 38–126)
ALT: 16 U/L (ref 14–54)
AST: 15 U/L (ref 15–41)
Albumin: 4.1 g/dL (ref 3.5–5.0)
Anion gap: 10 (ref 5–15)
BILIRUBIN TOTAL: 0.4 mg/dL (ref 0.3–1.2)
BUN: 12 mg/dL (ref 6–20)
CHLORIDE: 104 mmol/L (ref 101–111)
CO2: 24 mmol/L (ref 22–32)
CREATININE: 0.88 mg/dL (ref 0.44–1.00)
Calcium: 9.5 mg/dL (ref 8.9–10.3)
Glucose, Bld: 93 mg/dL (ref 65–99)
Potassium: 3.7 mmol/L (ref 3.5–5.1)
Sodium: 138 mmol/L (ref 135–145)
Total Protein: 7.7 g/dL (ref 6.5–8.1)

## 2014-09-30 LAB — CBC
HEMATOCRIT: 41.2 % (ref 36.0–46.0)
HEMOGLOBIN: 13.7 g/dL (ref 12.0–15.0)
MCH: 31.4 pg (ref 26.0–34.0)
MCHC: 33.3 g/dL (ref 30.0–36.0)
MCV: 94.3 fL (ref 78.0–100.0)
Platelets: 212 10*3/uL (ref 150–400)
RBC: 4.37 MIL/uL (ref 3.87–5.11)
RDW: 14.3 % (ref 11.5–15.5)
WBC: 6.1 10*3/uL (ref 4.0–10.5)

## 2014-09-30 LAB — URINE MICROSCOPIC-ADD ON

## 2014-09-30 LAB — POC URINE PREG, ED: Preg Test, Ur: NEGATIVE

## 2014-09-30 MED ORDER — SODIUM CHLORIDE 0.9 % IV BOLUS (SEPSIS): 1000.0000 mL | Freq: Once | INTRAVENOUS | Status: DC

## 2014-09-30 NOTE — Discharge Instructions (Signed)

## 2014-09-30 NOTE — ED Notes (Signed)
The patient is aware that a urine specimen is needed. 

## 2014-09-30 NOTE — ED Notes (Signed)
CBG is 99. Notified nurse Raquel Sarna.

## 2014-09-30 NOTE — ED Notes (Signed)
Pt here for hyperglycemia over the past few days. sts nausea, lightheaded and pain in legs and feet.,

## 2014-09-30 NOTE — ED Notes (Addendum)
I gave the patient a cup of ice and a diet ginger-ale per nurse Raquel Sarna.

## 2014-09-30 NOTE — ED Notes (Signed)
Pt comfortable with discharge and follow up instructions. Pt declines wheelchair, escorted to waiting area by this RN. Prescription for CBG test strips provided

## 2014-09-30 NOTE — ED Provider Notes (Signed)
CSN: [id number]     Arrival date & time 09/30/14  1031 History   First MD Initiated Contact with Patient 09/30/14 1043     Chief Complaint  Patient presents with  . Hyperglycemia    HPI  Patient presents for evaluation of a high blood sugar. Has a history of diabetes diagnosed one month ago. Is on a new injectable hypoglycemic medication. She cannot recall the name of it. Is a twice per day injection. She states most A she is under good control under 150. She states once or twice per week she will have a reading is over 500. It happened this morning. She used her injectable this morning. Presents here. She states she is just "upset and concerned". She has an appointment at the diabetic clinic in another 3 weeks. Does have some excessive urination. Does not have excessive thirst. Is not lightheaded. No vision changes. No additional symptoms.   Past Medical History  Diagnosis Date  . 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 Seizures     due to head trauma as adult  . Asthma     as child  . Meningioma   . Gallstones     s/p cholecystectomy  . HTN (hypertension)   . GERD (gastroesophageal reflux disease)   . Allergic rhinitis   . Cigarette nicotine dependence with withdrawal   . DVT (deep venous thrombosis)   . Chronic back pain   . Sciatic pain   . Arthritis   . HIV (human immunodeficiency virus infection)    Past Surgical History  Procedure Laterality Date  . Brain surgery      2013 to remove a meningioma  . Cholecystectomy     Family History  Problem Relation Age of Onset  . Other Mother     varicose vein  . Asthma Mother   . High blood pressure Mother   . Deep vein thrombosis Neg Hx   . Pulmonary embolism Neg Hx   . Cancer    . Diabetes    . High blood pressure    . Asthma    . Thyroid disease    . Cancer Father    History  Substance Use Topics  . Smoking status: Current Every Day Smoker -- 0.25 packs/day for 18 years    Types: Cigarettes  . Smokeless  tobacco: Never Used  . Alcohol Use: No   OB History    Gravida Para Term Preterm AB TAB SAB Ectopic Multiple Living   3 1 1  2 2    1      Review of Systems  Constitutional: Negative for fever, chills, diaphoresis, appetite change and fatigue.  HENT: Negative for mouth sores, sore throat and trouble swallowing.   Eyes: Negative for visual disturbance.  Respiratory: Negative for cough, chest tightness, shortness of breath and wheezing.   Cardiovascular: Negative for chest pain.  Gastrointestinal: Negative for nausea, vomiting, abdominal pain, diarrhea and abdominal distention.  Endocrine: Positive for polydipsia and polyuria. Negative for polyphagia.  Genitourinary: Negative for dysuria, frequency and hematuria.  Musculoskeletal: Negative for gait problem.  Skin: Negative for color change, pallor and rash.  Neurological: Negative for dizziness, syncope, light-headedness and headaches.  Hematological: Does not bruise/bleed easily.  Psychiatric/Behavioral: Negative for behavioral problems and confusion.      Allergies  Ibuprofen; Clindamycin/lincomycin; Coconut flavor; Flagyl; Latex; Other; and Aspirin  Home Medications   Prior to Admission medications   Medication Sig Start Date End Date Taking? Authorizing  Provider  Abacavir-Dolutegravir-Lamivud (TRIUMEQ) 600-50-300 MG TABS Take 1 tablet by mouth daily. 06/23/14   Truman Hayward, MD  acetaminophen (TYLENOL) 500 MG tablet Take 1,000 mg by mouth every 6 (six) hours as needed for moderate pain or headache.    Historical Provider, MD  albuterol (PROVENTIL HFA;VENTOLIN HFA) 108 (90 BASE) MCG/ACT inhaler Inhale 2 puffs into the lungs every 4 (four) hours as needed for wheezing or shortness of breath. 08/02/14   Britt Bottom, NP  albuterol (PROVENTIL) (2.5 MG/3ML) 0.083% nebulizer solution Take 2.5 mg by nebulization every 6 (six) hours as needed for wheezing or shortness of breath.    Historical Provider, MD  alprazolam Duanne Moron)  2 MG tablet Take 2 mg by mouth 3 (three) times daily as needed for sleep.    Historical Provider, MD  amphetamine-dextroamphetamine (ADDERALL XR) 30 MG 24 hr capsule Take 30 mg by mouth 2 (two) times daily.    Historical Provider, MD  beclomethasone (QVAR) 40 MCG/ACT inhaler Inhale 2 puffs into the lungs 2 (two) times daily as needed (for shortness of breath).     Historical Provider, MD  benzocaine (HURRICAINE) 20 % oral spray Apply 1 application topically 3 (three) times daily as needed for pain.    Historical Provider, MD  cyclobenzaprine (FLEXERIL) 10 MG tablet Take 1 tablet (10 mg total) by mouth 2 (two) times daily as needed for muscle spasms. 02/01/13   Larence Penning, MD  diazepam (VALIUM) 10 MG tablet Take 10 mg by mouth every 6 (six) hours as needed for anxiety.    Historical Provider, MD  divalproex (DEPAKOTE) 500 MG DR tablet Take 1 tablet (500 mg total) by mouth 3 (three) times daily. 07/09/13   Harvie Heck, PA-C  divalproex (DEPAKOTE) 500 MG DR tablet Take 1 tablet (500 mg total) by mouth 3 (three) times daily. Patient not taking: Reported on 08/30/2014 01/06/14   Noemi Chapel, MD  fluticasone [facility]) 50 MCG/ACT nasal spray Place 1 spray into the nose 2 (two) times daily.    Historical Provider, MD  folic acid (FOLVITE) 1 MG tablet Take 1 mg by mouth daily.    Historical Provider, MD  gabapentin (NEURONTIN) 400 MG capsule Take 800 mg by mouth 3 (three) times daily.     Historical Provider, MD  hydrochlorothiazide (HYDRODIURIL) 25 MG tablet Take 1 tablet (25 mg total) by mouth daily. 07/09/13   Harvie Heck, PA-C  HYDROcodone-acetaminophen (NORCO/VICODIN) 5-325 MG per tablet Take 1 tablet by mouth every 4 (four) hours as needed. 08/06/14   Kristen N Ward, DO  levETIRAcetam (KEPPRA) 1000 MG tablet Take 1 tablet (1,000 mg total) by mouth 2 (two) times daily. 07/09/13   Harvie Heck, PA-C  loratadine (CLARITIN) 10 MG tablet Take 10 mg by mouth daily.  03/03/14   Historical Provider, MD   Mefenamic Acid 250 MG CAPS Take 2 capsules (500 mg total) by mouth 3 (three) times daily. X 4 days 08/30/14   Mercedes Camprubi-Soms, PA-C  metroNIDAZOLE (METROGEL VAGINAL) 0.75 % vaginal gel Place 1 Applicatorful vaginally 2 (two) times daily. X 5 days 08/30/14   Mercedes Camprubi-Soms, PA-C  mirtazapine (REMERON) 15 MG tablet Take 20 mg by mouth at bedtime.     Historical Provider, MD  omeprazole (PRILOSEC) 20 MG capsule Take 20 mg by mouth 2 (two) times daily.    Historical Provider, MD  ondansetron (ZOFRAN ODT) 4 MG disintegrating tablet Take 1 tablet (4 mg total) by mouth every 8 (eight) hours as needed for nausea or  vomiting. 08/06/14   Kristen N Ward, DO  oxyCODONE-acetaminophen (PERCOCET) 10-325 MG per tablet Take 1 tablet by mouth every 4 (four) hours as needed for pain.    Historical Provider, MD  PARoxetine (PAXIL) 40 MG tablet Take 1 tablet (40 mg total) by mouth daily. 07/09/13   Lauren Parker, PA-C  Phenylephrine-DM-GG-APAP (MUCINEX FAST-MAX CONGEST COLD) 5-10-200-325 MG TABS Take 2 tablets by mouth 3 (three) times daily. Patient not taking: Reported on 08/30/2014 08/02/14   Britt Bottom, NP  promethazine (PHENERGAN) 6.25 MG/5ML syrup Take 12.5 mg by mouth every 6 (six) hours as needed for nausea or vomiting.    Historical Provider, MD  propranolol (INDERAL) 10 MG tablet Take 10 mg by mouth 2 (two) times daily.  01/28/14   Historical Provider, MD  sodium chloride (OCEAN) 0.65 % nasal spray Place 1 spray into the nose 2 (two) times daily as needed for congestion.     Historical Provider, MD  triamcinolone ointment (KENALOG) 0.5 % Apply 1 application topically 2 (two) times daily.    Historical Provider, MD   BP 134/80 mmHg  Pulse 93  Temp(Src) 99.4 F (37.4 C) (Oral)  Resp 16  Ht 5\' 5"  (1.651 m)  Wt 240 lb (108.863 kg)  BMI 39.94 kg/m2  SpO2 99%  LMP 09/30/2014 Physical Exam  Constitutional: She is oriented to person, place, and time. She appears well-developed and  well-nourished. No distress.  Obese. Awake and alert. Mentating well. Somewhat emotional and crying occasionally and stating that she is "just scared because it  is all so new".  HENT:  Head: Normocephalic.  Eyes: Conjunctivae are normal. Pupils are equal, round, and reactive to light. No scleral icterus.  Neck: Normal range of motion. Neck supple. No thyromegaly present.  Cardiovascular: Normal rate and regular rhythm.  Exam reveals no gallop and no friction rub.   No murmur heard. Pulmonary/Chest: Effort normal and breath sounds normal. No respiratory distress. She has no wheezes. She has no rales.  Abdominal: Soft. Bowel sounds are normal. She exhibits no distension. There is no tenderness. There is no rebound.  Musculoskeletal: Normal range of motion.  Neurological: She is alert and oriented to person, place, and time.  Skin: Skin is warm and dry. No rash noted.  Psychiatric: She has a normal mood and affect. Her behavior is normal.    ED Course  Procedures (including critical care time) Labs Review Labs Reviewed  URINALYSIS, ROUTINE W REFLEX MICROSCOPIC (NOT AT [facility]) - Abnormal; Notable for the following:    Color, Urine AMBER (*)    APPearance CLOUDY (*)    Hgb urine dipstick MODERATE (*)    Bilirubin Urine SMALL (*)    Ketones, ur 15 (*)    All other components within normal limits  URINE MICROSCOPIC-ADD ON - Abnormal; Notable for the following:    Bacteria, UA FEW (*)    All other components within normal limits  CBC  COMPREHENSIVE METABOLIC PANEL  HEMOGLOBIN A1C  CBG MONITORING, ED  POC URINE PREG, ED    Imaging Review No results found.   EKG Interpretation None      MDM   Final diagnoses:  Hyperglycemia    A1c still pending. Remainder of her labs are reassuring. Given by mouth fluids. No emergent process today. I educated her at the bedside as best I could regarding diabetic diet. Avoiding simple and complex sugars. Avoiding bread cereals in carbohydrates.  Keep her appointment at diabetic clinic. Continue her medications. Check and record her  blood sugars for follow-up.    Tanna Furry, MD 09/30/14 1350

## 2014-09-30 NOTE — ED Notes (Signed)
Unable to obtain IV start - Dr. Jeneen Rinks aware, states to provide PO hydration.

## 2014-09-30 NOTE — ED Notes (Signed)
I gave the patient a pitcher of ice water per nurse Raquel Sarna.

## 2014-10-01 LAB — HEMOGLOBIN A1C
Hgb A1c MFr Bld: 6.2 % — ABNORMAL HIGH (ref 4.8–5.6)
MEAN PLASMA GLUCOSE: 131 mg/dL

## 2014-10-06 ENCOUNTER — Ambulatory Visit: Payer: Medicaid Other

## 2014-10-13 ENCOUNTER — Ambulatory Visit: Payer: Medicaid Other

## 2014-10-29 ENCOUNTER — Telehealth (HOSPITAL_COMMUNITY): Payer: Self-pay

## 2014-10-29 ENCOUNTER — Emergency Department (HOSPITAL_COMMUNITY)
Admission: EM | Admit: 2014-10-29 | Discharge: 2014-10-29 | Disposition: A | Discharge status: Home / Self Care | Payer: Medicaid Other | Attending: Emergency Medicine | Admitting: Emergency Medicine

## 2014-10-29 ENCOUNTER — Emergency Department (HOSPITAL_COMMUNITY): Payer: Medicaid Other

## 2014-10-29 ENCOUNTER — Encounter (HOSPITAL_COMMUNITY): Payer: Self-pay | Admitting: *Deleted

## 2014-10-29 DIAGNOSIS — Z21 Asymptomatic human immunodeficiency virus [HIV] infection status: Secondary | ICD-10-CM | POA: Diagnosis not present

## 2014-10-29 DIAGNOSIS — Z720 Tobacco use: Secondary | ICD-10-CM | POA: Diagnosis not present

## 2014-10-29 DIAGNOSIS — I10 Essential (primary) hypertension: Secondary | ICD-10-CM | POA: Diagnosis not present

## 2014-10-29 DIAGNOSIS — G40909 Epilepsy, unspecified, not intractable, without status epilepticus: Secondary | ICD-10-CM | POA: Insufficient documentation

## 2014-10-29 DIAGNOSIS — S8392XA Sprain of unspecified site of left knee, initial encounter: Secondary | ICD-10-CM | POA: Insufficient documentation

## 2014-10-29 DIAGNOSIS — S76012A Strain of muscle, fascia and tendon of left hip, initial encounter: Secondary | ICD-10-CM | POA: Diagnosis not present

## 2014-10-29 DIAGNOSIS — Z91040 Latex allergy status: Secondary | ICD-10-CM | POA: Insufficient documentation

## 2014-10-29 DIAGNOSIS — W01198A Fall on same level from slipping, tripping and stumbling with subsequent striking against other object, initial encounter: Secondary | ICD-10-CM | POA: Diagnosis not present

## 2014-10-29 DIAGNOSIS — Z79899 Other long term (current) drug therapy: Secondary | ICD-10-CM | POA: Diagnosis not present

## 2014-10-29 DIAGNOSIS — G8929 Other chronic pain: Secondary | ICD-10-CM | POA: Diagnosis not present

## 2014-10-29 DIAGNOSIS — Y9389 Activity, other specified: Secondary | ICD-10-CM | POA: Insufficient documentation

## 2014-10-29 DIAGNOSIS — Z7951 Long term (current) use of inhaled steroids: Secondary | ICD-10-CM | POA: Insufficient documentation

## 2014-10-29 DIAGNOSIS — K219 Gastro-esophageal reflux disease without esophagitis: Secondary | ICD-10-CM | POA: Insufficient documentation

## 2014-10-29 DIAGNOSIS — Z3202 Encounter for pregnancy test, result negative: Secondary | ICD-10-CM | POA: Insufficient documentation

## 2014-10-29 DIAGNOSIS — Y998 Other external cause status: Secondary | ICD-10-CM | POA: Insufficient documentation

## 2014-10-29 DIAGNOSIS — J45909 Unspecified asthma, uncomplicated: Secondary | ICD-10-CM | POA: Insufficient documentation

## 2014-10-29 DIAGNOSIS — Y9289 Other specified places as the place of occurrence of the external cause: Secondary | ICD-10-CM | POA: Diagnosis not present

## 2014-10-29 DIAGNOSIS — S86811A Strain of other muscle(s) and tendon(s) at lower leg level, right leg, initial encounter: Secondary | ICD-10-CM | POA: Insufficient documentation

## 2014-10-29 DIAGNOSIS — S63501A Unspecified sprain of right wrist, initial encounter: Secondary | ICD-10-CM | POA: Insufficient documentation

## 2014-10-29 DIAGNOSIS — M1990 Unspecified osteoarthritis, unspecified site: Secondary | ICD-10-CM | POA: Insufficient documentation

## 2014-10-29 DIAGNOSIS — S6991XA Unspecified injury of right wrist, hand and finger(s), initial encounter: Secondary | ICD-10-CM | POA: Diagnosis present

## 2014-10-29 DIAGNOSIS — W19XXXA Unspecified fall, initial encounter: Secondary | ICD-10-CM

## 2014-10-29 DIAGNOSIS — Z86718 Personal history of other venous thrombosis and embolism: Secondary | ICD-10-CM | POA: Insufficient documentation

## 2014-10-29 LAB — PREGNANCY, URINE: Preg Test, Ur: NEGATIVE

## 2014-10-29 MED ORDER — HYDROMORPHONE HCL 1 MG/ML IJ SOLN
1.0000 mg | Freq: Once | INTRAMUSCULAR | Status: AC
  Administered 2014-10-29: 1 mg via INTRAMUSCULAR
  Filled 2014-10-29: qty 1

## 2014-10-29 MED ORDER — HYDROCODONE-ACETAMINOPHEN 5-325 MG PO TABS: 1.0000 | ORAL_TABLET | Freq: Four times a day (QID) | ORAL | Status: DC | PRN

## 2014-10-29 MED ORDER — HYDROCODONE-ACETAMINOPHEN 5-325 MG PO TABS
2.0000 | ORAL_TABLET | Freq: Once | ORAL | Status: AC
  Administered 2014-10-29: 2 via ORAL
  Filled 2014-10-29: qty 2

## 2014-10-29 NOTE — Telephone Encounter (Signed)
Pt calling stated pharmacy refusing to fill Norco/Vicodin Rx given to her today in ED because she had Percocet 10/325 mg filled 2 weeks ago.  Pt states not taking Percocet, got rid of them because they were interfering with new DM Rx.  Current Probation officer spoke to C. Lawyer PA regarding matter and advised pt to contact prescriber of  Percocet and have them notify pharmacy that she is no longer taking medication and then pharmacy can fill Norco/Vicodin Rx.

## 2014-10-29 NOTE — Discharge Instructions (Signed)
Wrist Pain Wrist injuries are frequent in adults and children. A sprain is an injury to the ligaments that hold your bones together. A strain is an injury to muscle or muscle cord-like structures (tendons) from stretching or pulling. Generally, when wrists are moderately tender to touch following a fall or injury, a break in the bone (fracture) may be present. Most wrist sprains or strains are better in 3 to 5 days, but complete healing may take several weeks. HOME CARE INSTRUCTIONS   Put ice on the injured area.  Put ice in a plastic bag.  Place a towel between your skin and the bag.  Leave the ice on for 15-20 minutes, 3-4 times a day, for the first 2 days, or as directed by your health care provider.  Keep your arm raised above the level of your heart whenever possible to reduce swelling and pain.  Rest the injured area for at least 48 hours or as directed by your health care provider.  If a splint or elastic bandage has been applied, use it for as long as directed by your health care provider or until seen by a health care provider for a follow-up exam.  Only take over-the-counter or prescription medicines for pain, discomfort, or fever as directed by your health care provider.  Keep all follow-up appointments. You may need to follow up with a specialist or have follow-up X-rays. Improvement in pain level is not a guarantee that you did not fracture a bone in your wrist. The only way to determine whether or not you have a broken bone is by X-ray. SEEK IMMEDIATE MEDICAL CARE IF:   Your fingers are swollen, very red, white, or cold and blue.  Your fingers are numb or tingling.  You have increasing pain.  You have difficulty moving your fingers. MAKE SURE YOU:   Understand these instructions.  Will watch your condition.  Will get help right away if you are not doing well or get worse. Document Released: 01/09/2005 Document Revised: 04/06/2013 Document Reviewed:  05/23/2010 [facility] Patient Information 2015 [place], [place]. This information is not intended to replace advice given to you by your health care provider. Make sure you discuss any questions you have with your health care provider.  Hip Pain Your hip is the joint between your upper legs and your lower pelvis. The bones, cartilage, tendons, and muscles of your hip joint perform a lot of work each day supporting your body weight and allowing you to move around. Hip pain can range from a minor ache to severe pain in one or both of your hips. Pain may be felt on the inside of the hip joint near the groin, or the outside near the buttocks and upper thigh. You may have swelling or stiffness as well.  HOME CARE INSTRUCTIONS   Take medicines only as directed by your health care provider.  Apply ice to the injured area:  Put ice in a plastic bag.  Place a towel between your skin and the bag.  Leave the ice on for 15-20 minutes at a time, 3-4 times a day.  Keep your leg raised (elevated) when possible to lessen swelling.  Avoid activities that cause pain.  Follow specific exercises as directed by your health care provider.  Sleep with a pillow between your legs on your most comfortable side.  Record how often you have hip pain, the location of the pain, and what it feels like. SEEK MEDICAL CARE IF:   You are unable to  put weight on your leg.  Your hip is red or swollen or very tender to touch.  Your pain or swelling continues or worsens after 1 week.  You have increasing difficulty walking.  You have a fever. SEEK IMMEDIATE MEDICAL CARE IF:   You have fallen.  You have a sudden increase in pain and swelling in your hip. MAKE SURE YOU:   Understand these instructions.  Will watch your condition.  Will get help right away if you are not doing well or get worse. Document Released: 09/19/2009 Document Revised: 08/16/2013 Document Reviewed: 11/26/2012 [facility] Patient  Information 2015 [place], [place]. This information is not intended to replace advice given to you by your health care provider. Make sure you discuss any questions you have with your health care provider.  Knee Sprain A knee sprain is a tear in one of the strong, fibrous tissues that connect the bones (ligaments) in your knee. The severity of the sprain depends on how much of the ligament is torn. The tear can be either partial or complete. CAUSES  Often, sprains are a result of a fall or injury. The force of the impact causes the fibers of your ligament to stretch too much. This excess tension causes the fibers of your ligament to tear. SIGNS AND SYMPTOMS  You may have some loss of motion in your knee. Other symptoms include:  Bruising.  Pain in the knee area.  Tenderness of the knee to the touch.  Swelling. DIAGNOSIS  To diagnose a knee sprain, your health care provider will physically examine your knee. Your health care provider may also suggest an X-ray exam of your knee to make sure no bones are broken. TREATMENT  If your ligament is only partially torn, treatment usually involves keeping the knee in a fixed position (immobilization) or bracing your knee for activities that require movement for several weeks. To do this, your health care provider will apply a bandage, cast, or splint to keep your knee from moving and to support your knee during movement until it heals. For a partially torn ligament, the healing process usually takes 4-6 weeks. If your ligament is completely torn, depending on which ligament it is, you may need surgery to reconnect the ligament to the bone or reconstruct it. After surgery, a cast or splint may be applied and will need to stay on your knee for 4-6 weeks while your ligament heals. HOME CARE INSTRUCTIONS  Keep your injured knee elevated to decrease swelling.  To ease pain and swelling, apply ice to the injured area:  Put ice in a plastic bag.  Place a  towel between your skin and the bag.  Leave the ice on for 20 minutes, 2-3 times a day.  Only take medicine for pain as directed by your health care provider.  Do not leave your knee unprotected until pain and stiffness go away (usually 4-6 weeks).  If you have a cast or splint, do not allow it to get wet. If you have been instructed not to remove it, cover it with a plastic bag when you shower or bathe. Do not swim.  Your health care provider may suggest exercises for you to do during your recovery to prevent or limit permanent weakness and stiffness. SEEK IMMEDIATE MEDICAL CARE IF:  Your cast or splint becomes damaged.  Your pain becomes worse.  You have significant pain, swelling, or numbness below the cast or splint. MAKE SURE YOU:  Understand these instructions.  Will watch your condition.  Will get help right away if you are not doing well or get worse. Document Released: 04/01/2005 Document Revised: 01/20/2013 Document Reviewed: 11/11/2012 [facility] Patient Information 2015 [place], [place]. This information is not intended to replace advice given to you by your health care provider. Make sure you discuss any questions you have with your health care provider.

## 2014-10-29 NOTE — ED Notes (Signed)
Patient transported to X-ray 

## 2014-10-29 NOTE — ED Provider Notes (Signed)
CSN: [id number]     Arrival date & time 10/29/14  1217 History  This chart was scribed for Brent General, PA-C, working with Ripley Fraise, MD by Steva Colder, ED Scribe. The patient was seen in room TR09C/TR09C at 1:05 PM.      Chief Complaint  Patient presents with  . Wrist Pain  . Leg Pain      The history is provided by the patient. No language interpreter was used.    Lori Ford is a 39 y.o. female with a medical hx of HIV who presents to the Emergency Department complaining of right wrist pain onset yesterday. Pt reports that she slipped and fell on water in [place]. Pt is having associated symptoms of left leg pain, left knee pain, and left hip pain. She denies color change, wound, and any other symptoms. Pt is allergic to ASA, IBU, and tylenol and she says that the ASA and IBU flare up her asthma. Pt notes that the tylenol gives her stomach pains but she will still take tylenol occassionaly.   Past Medical History  Diagnosis Date  . 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 Seizures     due to head trauma as adult  . Asthma     as child  . Meningioma   . Gallstones     s/p cholecystectomy  . HTN (hypertension)   . GERD (gastroesophageal reflux disease)   . Allergic rhinitis   . Cigarette nicotine dependence with withdrawal   . DVT (deep venous thrombosis)   . Chronic back pain   . Sciatic pain   . Arthritis   . HIV (human immunodeficiency virus infection)    Past Surgical History  Procedure Laterality Date  . Brain surgery      2013 to remove a meningioma  . Cholecystectomy     Family History  Problem Relation Age of Onset  . Other Mother     varicose vein  . Asthma Mother   . High blood pressure Mother   . Deep vein thrombosis Neg Hx   . Pulmonary embolism Neg Hx   . Cancer    . Diabetes    . High blood pressure    . Asthma    . Thyroid disease    . Cancer Father    History  Substance Use Topics  . Smoking status: Current Every Day Smoker --  0.25 packs/day for 18 years    Types: Cigarettes  . Smokeless tobacco: Never Used  . Alcohol Use: No   OB History    Gravida Para Term Preterm AB TAB SAB Ectopic Multiple Living   3 1 1  2 2    1      Review of Systems  Musculoskeletal: Positive for arthralgias. Negative for joint swelling.  Skin: Negative for color change and wound.      Allergies  Ibuprofen; Clindamycin/lincomycin; Coconut flavor; Flagyl; Latex; Other; and Aspirin  Home Medications   Prior to Admission medications   Medication Sig Start Date End Date Taking? Authorizing Provider  Abacavir-Dolutegravir-Lamivud (TRIUMEQ) 600-50-300 MG TABS Take 1 tablet by mouth daily. 06/23/14   Truman Hayward, MD  acetaminophen (TYLENOL) 500 MG tablet Take 1,000 mg by mouth every 6 (six) hours as needed for moderate pain or headache.    Historical Provider, MD  albuterol (PROVENTIL HFA;VENTOLIN HFA) 108 (90 BASE) MCG/ACT inhaler Inhale 2 puffs into the lungs every 4 (four) hours as needed for wheezing or shortness of  breath. 08/02/14   Britt Bottom, NP  albuterol (PROVENTIL) (2.5 MG/3ML) 0.083% nebulizer solution Take 2.5 mg by nebulization every 6 (six) hours as needed for wheezing or shortness of breath.    Historical Provider, MD  alprazolam Duanne Moron) 2 MG tablet Take 2 mg by mouth 3 (three) times daily as needed for sleep.    Historical Provider, MD  amphetamine-dextroamphetamine (ADDERALL XR) 30 MG 24 hr capsule Take 30 mg by mouth 2 (two) times daily.    Historical Provider, MD  beclomethasone (QVAR) 40 MCG/ACT inhaler Inhale 2 puffs into the lungs 2 (two) times daily as needed (for shortness of breath).     Historical Provider, MD  benzocaine (HURRICAINE) 20 % oral spray Apply 1 application topically 3 (three) times daily as needed for pain.    Historical Provider, MD  cyclobenzaprine (FLEXERIL) 10 MG tablet Take 1 tablet (10 mg total) by mouth 2 (two) times daily as needed for muscle spasms. 02/01/13   Larence Penning, MD   diazepam (VALIUM) 10 MG tablet Take 10 mg by mouth every 6 (six) hours as needed for anxiety.    Historical Provider, MD  divalproex (DEPAKOTE) 500 MG DR tablet Take 1 tablet (500 mg total) by mouth 3 (three) times daily. 07/09/13   Harvie Heck, PA-C  divalproex (DEPAKOTE) 500 MG DR tablet Take 1 tablet (500 mg total) by mouth 3 (three) times daily. Patient not taking: Reported on 08/30/2014 01/06/14   Noemi Chapel, MD  fluticasone [facility]) 50 MCG/ACT nasal spray Place 1 spray into the nose 2 (two) times daily.    Historical Provider, MD  folic acid (FOLVITE) 1 MG tablet Take 1 mg by mouth daily.    Historical Provider, MD  gabapentin (NEURONTIN) 400 MG capsule Take 800 mg by mouth 3 (three) times daily.     Historical Provider, MD  hydrochlorothiazide (HYDRODIURIL) 25 MG tablet Take 1 tablet (25 mg total) by mouth daily. 07/09/13   Harvie Heck, PA-C  HYDROcodone-acetaminophen (NORCO/VICODIN) 5-325 MG per tablet Take 1 tablet by mouth every 4 (four) hours as needed. 08/06/14   Kristen N Ward, DO  HYDROcodone-acetaminophen (NORCO/VICODIN) 5-325 MG per tablet Take 1-2 tablets by mouth every 6 (six) hours as needed. 10/29/14   Dahlia Bailiff, PA-C  levETIRAcetam (KEPPRA) 1000 MG tablet Take 1 tablet (1,000 mg total) by mouth 2 (two) times daily. 07/09/13   Harvie Heck, PA-C  loratadine (CLARITIN) 10 MG tablet Take 10 mg by mouth daily.  03/03/14   Historical Provider, MD  Mefenamic Acid 250 MG CAPS Take 2 capsules (500 mg total) by mouth 3 (three) times daily. X 4 days 08/30/14   Mercedes Camprubi-Soms, PA-C  metroNIDAZOLE (METROGEL VAGINAL) 0.75 % vaginal gel Place 1 Applicatorful vaginally 2 (two) times daily. X 5 days 08/30/14   Mercedes Camprubi-Soms, PA-C  mirtazapine (REMERON) 15 MG tablet Take 20 mg by mouth at bedtime.     Historical Provider, MD  omeprazole (PRILOSEC) 20 MG capsule Take 20 mg by mouth 2 (two) times daily.    Historical Provider, MD  ondansetron (ZOFRAN ODT) 4 MG disintegrating  tablet Take 1 tablet (4 mg total) by mouth every 8 (eight) hours as needed for nausea or vomiting. 08/06/14   Kristen N Ward, DO  oxyCODONE-acetaminophen (PERCOCET) 10-325 MG per tablet Take 1 tablet by mouth every 4 (four) hours as needed for pain.    Historical Provider, MD  PARoxetine (PAXIL) 40 MG tablet Take 1 tablet (40 mg total) by mouth daily. 07/09/13  Harvie Heck, PA-C  Phenylephrine-DM-GG-APAP (MUCINEX FAST-MAX CONGEST COLD) 5-10-200-325 MG TABS Take 2 tablets by mouth 3 (three) times daily. Patient not taking: Reported on 08/30/2014 08/02/14   Britt Bottom, NP  promethazine (PHENERGAN) 6.25 MG/5ML syrup Take 12.5 mg by mouth every 6 (six) hours as needed for nausea or vomiting.    Historical Provider, MD  propranolol (INDERAL) 10 MG tablet Take 10 mg by mouth 2 (two) times daily.  01/28/14   Historical Provider, MD  sodium chloride (OCEAN) 0.65 % nasal spray Place 1 spray into the nose 2 (two) times daily as needed for congestion.     Historical Provider, MD  triamcinolone ointment (KENALOG) 0.5 % Apply 1 application topically 2 (two) times daily.    Historical Provider, MD   BP 128/81 mmHg  Pulse 100  Temp(Src) 98 F (36.7 C) (Oral)  Resp 16  Ht 5\' 5"  (1.651 m)  Wt 254 lb (115.214 kg)  BMI 42.27 kg/m2  SpO2 100%  LMP 10/29/2014 Physical Exam  Constitutional: She is oriented to person, place, and time. She appears well-developed and well-nourished. No distress.  HENT:  Head: Normocephalic and atraumatic.  Eyes: EOM are normal.  Neck: Neck supple. No tracheal deviation present.  Cardiovascular: Normal rate.   Pulses:      Radial pulses are 2+ on the right side, and 2+ on the left side.  Pulmonary/Chest: Effort normal. No respiratory distress.  Musculoskeletal: Normal range of motion.       Right wrist: She exhibits tenderness. She exhibits normal range of motion.  Right wrist exam: diffuse tenderness throughout wrist. Full active and passive ROM without instabilty. No  anatomic snuffbox tenderness. No deformity, swelling, or erythema noted. Cap refill less than 2 second. Distal sensation intact.  Left Hip exam: Pain with flexion of left hip. TTP external trochanteric region. Pain with external/internal rotation of hip.  Left knee exam: TTP to anterior portion of patellar region. No A/P, lateral or medial instability. Full active and passive range of motion of knee. No obvious effusion, deformity, erythema, swelling.   Neurological: She is alert and oriented to person, place, and time.  Skin: Skin is warm and dry.  Psychiatric: She has a normal mood and affect. Her behavior is normal.  Nursing note and vitals reviewed.   ED Course  Procedures (including critical care time) DIAGNOSTIC STUDIES: Oxygen Saturation is 100% on RA, nl by my interpretation.    COORDINATION OF CARE: 1:09 PM-Discussed treatment plan with pt at bedside and pt agreed to plan.   Labs Review Results for orders placed or performed during the hospital encounter of 10/29/14  Pregnancy, urine  Result Value Ref Range   Preg Test, Ur NEGATIVE NEGATIVE   Dg Wrist Complete Right  10/29/2014   CLINICAL DATA:  [place] on wet floor yesterday, right wrist pain  EXAM: RIGHT WRIST - COMPLETE 3+ VIEW  COMPARISON:  None.  FINDINGS: Four views of the right wrist submitted. No acute fracture or subluxation. No radiopaque foreign body.  IMPRESSION: Negative.   Electronically Signed   By: Lahoma Crocker M.D.   On: 10/29/2014 14:11   Ct Hip Left Wo Contrast  10/29/2014   CLINICAL DATA:  [place] Fri. In the bathroom at [employer]. Left hip pain  EXAM: CT OF THE LEFT HIP WITHOUT CONTRAST  TECHNIQUE: Multidetector CT imaging of the left hip was performed according to the standard protocol. Multiplanar CT image reconstructions were also generated.  COMPARISON:  Current pelvis radiographs  FINDINGS: No fracture  or bone lesion. Hip joint is normally spaced and aligned. No arthropathic change. No joint effusion.   Surrounding muscular structures are unremarkable. Normal overlying soft tissues.  Visible structures within the pelvis are unremarkable.  IMPRESSION: Negative exam.  No fracture.   Electronically Signed   By: Lajean Manes M.D.   On: 10/29/2014 15:36   Dg Knee Complete 4 Views Left  10/29/2014   CLINICAL DATA:  [place] on a wet floor yesterday, left knee pain  EXAM: LEFT KNEE - COMPLETE 4+ VIEW  COMPARISON:  04/08/2014  FINDINGS: Four views of the left knee submitted. No acute fracture or subluxation. No radiopaque foreign body.  IMPRESSION: Negative.   Electronically Signed   By: Lahoma Crocker M.D.   On: 10/29/2014 14:10   Dg Hip Unilat With Pelvis 2-3 Views Left  10/29/2014   CLINICAL DATA:  [place] on a wet floor yesterday, left hip pain, right wrist pain  EXAM: DG HIP (WITH OR WITHOUT PELVIS) 2-3V LEFT  COMPARISON:  None.  FINDINGS: Three views of the left hip submitted. No acute fracture or subluxation. Bilateral hip joints are symmetrical in appearance. Pubic symphysis is unremarkable.  IMPRESSION: Negative.   Electronically Signed   By: Lahoma Crocker M.D.   On: 10/29/2014 14:10        EKG Interpretation None      MDM   Final diagnoses:  Fall  Wrist sprain, right, initial encounter  Knee sprain and strain, left, initial encounter  Hip strain, left, initial encounter    Patient here status post fall with right wrist, left knee and left hip pain. Patient is neurovascularly intact. Radiographs do not show evidence of acute pathology. On reexamination after pain management, patient is still having a significant amount of left hip pain. Pain is out of proportion to exam. Will follow-up with CT hip.  CT hip impression: Negative exam. No fracture.  Patient given knee immobilizer, wrist splint. Patient encouraged to follow-up orthopedics. RI CE therapy discussed. Patient hemodynamically stable and in no acute distress, patient stable for discharge. Encouraged follow-up, return precautions  discussed, patient verbalizes understanding and agreement of this plan.  I personally performed the services described in this documentation, which was scribed in my presence. The recorded information has been reviewed and is accurate.  BP 128/81 mmHg  Pulse 100  Temp(Src) 98 F (36.7 C) (Oral)  Resp 16  Ht 5\' 5"  (1.651 m)  Wt 254 lb (115.214 kg)  BMI 42.27 kg/m2  SpO2 100%  LMP 10/29/2014  Signed,  Dahlia Bailiff, PA-C 6:06 PM   Dahlia Bailiff, PA-C 10/29/14 1805  Dahlia Bailiff, PA-C 10/29/14 1806  Ripley Fraise, MD 10/30/14 1007

## 2014-10-29 NOTE — ED Notes (Signed)
Pt reports pain to right wrist and LT leg after fall on Friday.

## 2014-12-07 ENCOUNTER — Emergency Department (HOSPITAL_COMMUNITY)
Admission: EM | Admit: 2014-12-07 | Discharge: 2014-12-07 | Disposition: A | Discharge status: Home / Self Care | Payer: Medicaid Other | Attending: Emergency Medicine | Admitting: Emergency Medicine

## 2014-12-07 ENCOUNTER — Encounter (HOSPITAL_COMMUNITY): Payer: Self-pay | Admitting: Emergency Medicine

## 2014-12-07 DIAGNOSIS — J45909 Unspecified asthma, uncomplicated: Secondary | ICD-10-CM | POA: Insufficient documentation

## 2014-12-07 DIAGNOSIS — I10 Essential (primary) hypertension: Secondary | ICD-10-CM | POA: Diagnosis not present

## 2014-12-07 DIAGNOSIS — M545 Low back pain, unspecified: Secondary | ICD-10-CM

## 2014-12-07 DIAGNOSIS — Z7951 Long term (current) use of inhaled steroids: Secondary | ICD-10-CM | POA: Insufficient documentation

## 2014-12-07 DIAGNOSIS — G8929 Other chronic pain: Secondary | ICD-10-CM | POA: Diagnosis not present

## 2014-12-07 DIAGNOSIS — Z91040 Latex allergy status: Secondary | ICD-10-CM | POA: Diagnosis not present

## 2014-12-07 DIAGNOSIS — Z21 Asymptomatic human immunodeficiency virus [HIV] infection status: Secondary | ICD-10-CM | POA: Insufficient documentation

## 2014-12-07 DIAGNOSIS — Z720 Tobacco use: Secondary | ICD-10-CM | POA: Diagnosis not present

## 2014-12-07 DIAGNOSIS — M1990 Unspecified osteoarthritis, unspecified site: Secondary | ICD-10-CM | POA: Diagnosis not present

## 2014-12-07 DIAGNOSIS — Z87828 Personal history of other (healed) physical injury and trauma: Secondary | ICD-10-CM | POA: Insufficient documentation

## 2014-12-07 DIAGNOSIS — Z79899 Other long term (current) drug therapy: Secondary | ICD-10-CM | POA: Insufficient documentation

## 2014-12-07 DIAGNOSIS — Z86718 Personal history of other venous thrombosis and embolism: Secondary | ICD-10-CM | POA: Insufficient documentation

## 2014-12-07 DIAGNOSIS — G40409 Other generalized epilepsy and epileptic syndromes, not intractable, without status epilepticus: Secondary | ICD-10-CM | POA: Insufficient documentation

## 2014-12-07 DIAGNOSIS — K219 Gastro-esophageal reflux disease without esophagitis: Secondary | ICD-10-CM | POA: Insufficient documentation

## 2014-12-07 MED ORDER — KETOROLAC TROMETHAMINE 60 MG/2ML IM SOLN
60.0000 mg | Freq: Once | INTRAMUSCULAR | Status: AC
  Administered 2014-12-07: 60 mg via INTRAMUSCULAR
  Filled 2014-12-07: qty 2

## 2014-12-07 MED ORDER — METHOCARBAMOL 500 MG PO TABS
500.0000 mg | ORAL_TABLET | Freq: Once | ORAL | Status: AC
  Administered 2014-12-07: 500 mg via ORAL
  Filled 2014-12-07: qty 1

## 2014-12-07 MED ORDER — MELOXICAM 15 MG PO TABS: 15.0000 mg | ORAL_TABLET | Freq: Every day | ORAL | Status: DC

## 2014-12-07 MED ORDER — METHOCARBAMOL 500 MG PO TABS: 500.0000 mg | ORAL_TABLET | Freq: Two times a day (BID) | ORAL | Status: DC

## 2014-12-07 MED ORDER — GLUCOSE BLOOD VI STRP: ORAL_STRIP | Status: DC

## 2014-12-07 NOTE — ED Notes (Signed)
Patient states she had a fall about a month ago and has had low to mid back pain with radiation to L leg ever since.  Denies other symptoms.

## 2014-12-07 NOTE — Discharge Instructions (Signed)
Back Pain, Adult Low back pain is very common. About 1 in 5 people have back pain.The cause of low back pain is rarely dangerous. The pain often gets better over time.About half of people with a sudden onset of back pain feel better in just 2 weeks. About 8 in 10 people feel better by 6 weeks.  CAUSES Some common causes of back pain include:  Strain of the muscles or ligaments supporting the spine.  Wear and tear (degeneration) of the spinal discs.  Arthritis.  Direct injury to the back. DIAGNOSIS Most of the time, the direct cause of low back pain is not known.However, back pain can be treated effectively even when the exact cause of the pain is unknown.Answering your caregiver's questions about your overall health and symptoms is one of the most accurate ways to make sure the cause of your pain is not dangerous. If your caregiver needs more information, he or she may order lab work or imaging tests (X-rays or MRIs).However, even if imaging tests show changes in your back, this usually does not require surgery. HOME CARE INSTRUCTIONS For many people, back pain returns.Since low back pain is rarely dangerous, it is often a condition that people can learn to manageon their own.   Remain active. It is stressful on the back to sit or stand in one place. Do not sit, drive, or stand in one place for more than 30 minutes at a time. Take short walks on level surfaces as soon as pain allows.Try to increase the length of time you walk each day.  Do not stay in bed.Resting more than 1 or 2 days can delay your recovery.  Do not avoid exercise or work.Your body is made to move.It is not dangerous to be active, even though your back may hurt.Your back will likely heal faster if you return to being active before your pain is gone.  Pay attention to your body when you bend and lift. Many people have less discomfortwhen lifting if they bend their knees, keep the load close to their bodies,and  avoid twisting. Often, the most comfortable positions are those that put less stress on your recovering back.  Find a comfortable position to sleep. Use a firm mattress and lie on your side with your knees slightly bent. If you lie on your back, put a pillow under your knees.  Only take over-the-counter or prescription medicines as directed by your caregiver. Over-the-counter medicines to reduce pain and inflammation are often the most helpful.Your caregiver may prescribe muscle relaxant drugs.These medicines help dull your pain so you can more quickly return to your normal activities and healthy exercise.  Put ice on the injured area.  Put ice in a plastic bag.  Place a towel between your skin and the bag.  Leave the ice on for 15-20 minutes, 03-04 times a day for the first 2 to 3 days. After that, ice and heat may be alternated to reduce pain and spasms.  Ask your caregiver about trying back exercises and gentle massage. This may be of some benefit.  Avoid feeling anxious or stressed.Stress increases muscle tension and can worsen back pain.It is important to recognize when you are anxious or stressed and learn ways to manage it.Exercise is a great option. SEEK MEDICAL CARE IF:  You have pain that is not relieved with rest or medicine.  You have pain that does not improve in 1 week.  You have new symptoms.  You are generally not feeling well. SEEK   IMMEDIATE MEDICAL CARE IF:   You have pain that radiates from your back into your legs.  You develop new bowel or bladder control problems.  You have unusual weakness or numbness in your arms or legs.  You develop nausea or vomiting.  You develop abdominal pain.  You feel faint. Document Released: 04/01/2005 Document Revised: 10/01/2011 Document Reviewed: 08/03/2013 [employer] Patient Information 2015 [employer]. This information is not intended to replace advice given to you by your health care provider. Make sure you  discuss any questions you have with your health care provider.  

## 2014-12-07 NOTE — ED Provider Notes (Signed)
CSN: [id number]     Arrival date & time 12/07/14  0921 History  This chart was scribed for non-physician practitioner, Linus Mako, working with Blanchie Dessert, MD by Evelene Croon, ED Scribe. This patient was seen in room TR06C/TR06C and the patient's care was started at 10:30 AM.     Chief Complaint  Patient presents with  . Back Pain    The history is provided by the patient. No language interpreter was used.    HPI Comments:  Lori Ford is a 39 y.o. female with a h/o DM who presents to the Emergency Department complaining of right and left sided constant back pain that worsened a few days ago. Pt states she fell about 1 month ago and has had the pain since. She was evaluated after the fall and had a negative XR of her back. She has been using a warm compress, Aleve, tylenol and flexeril without relief. She denies a recent increase in physical activity and recent acute injury. She also denies bowel/bladder incontinence and numbness in her BLE. Pt has a follow up appointment with ortho scheduled for mid September. Patient is out of Accucheck strips and requests refill.   PCP: OSEI-BONSU,GEORGE, MD Blood pressure 138/95, pulse 93, temperature 98.3 F (36.8 C), resp. rate 16, height 5\' 5"  (1.651 m), weight 250 lb (113.399 kg), last menstrual period 11/16/2014, SpO2 100 %.  The patient denies diaphoresis, fever, headache, weakness (general or focal), confusion, change of vision,  neck pain, dysphagia, aphagia, chest pain, shortness of breath, abdominal pains, nausea, vomiting, diarrhea, lower extremity swelling, rash.  Past Medical History  Diagnosis Date  . 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 Seizures     due to head trauma as adult  . Asthma     as child  . Meningioma   . Gallstones     s/p cholecystectomy  . HTN (hypertension)   . GERD (gastroesophageal reflux disease)   . Allergic rhinitis   . Cigarette nicotine dependence with withdrawal   . DVT (deep venous  thrombosis)   . Chronic back pain   . Sciatic pain   . Arthritis   . HIV (human immunodeficiency virus infection)    Past Surgical History  Procedure Laterality Date  . Brain surgery      2013 to remove a meningioma  . Cholecystectomy     Family History  Problem Relation Age of Onset  . Other Mother     varicose vein  . Asthma Mother   . High blood pressure Mother   . Deep vein thrombosis Neg Hx   . Pulmonary embolism Neg Hx   . Cancer    . Diabetes    . High blood pressure    . Asthma    . Thyroid disease    . Cancer Father    Social History  Substance Use Topics  . Smoking status: Current Every Day Smoker -- 0.25 packs/day for 18 years    Types: Cigarettes  . Smokeless tobacco: Never Used  . Alcohol Use: No   OB History    Gravida Para Term Preterm AB TAB SAB Ectopic Multiple Living   3 1 1  2 2    1      Review of Systems  Musculoskeletal: Positive for back pain.  Neurological: Negative for numbness.  All other systems reviewed and are negative.     Allergies  Ibuprofen; Clindamycin/lincomycin; Coconut flavor; Flagyl; Latex; Other; and Aspirin  Home Medications  Prior to Admission medications   Medication Sig Start Date End Date Taking? Authorizing Provider  Abacavir-Dolutegravir-Lamivud (TRIUMEQ) 600-50-300 MG TABS Take 1 tablet by mouth daily. 06/23/14   Truman Hayward, MD  acetaminophen (TYLENOL) 500 MG tablet Take 1,000 mg by mouth every 6 (six) hours as needed for moderate pain or headache.    Historical Provider, MD  albuterol (PROVENTIL HFA;VENTOLIN HFA) 108 (90 BASE) MCG/ACT inhaler Inhale 2 puffs into the lungs every 4 (four) hours as needed for wheezing or shortness of breath. 08/02/14   Britt Bottom, NP  albuterol (PROVENTIL) (2.5 MG/3ML) 0.083% nebulizer solution Take 2.5 mg by nebulization every 6 (six) hours as needed for wheezing or shortness of breath.    Historical Provider, MD  alprazolam Duanne Moron) 2 MG tablet Take 2 mg by mouth 3  (three) times daily as needed for sleep.    Historical Provider, MD  amphetamine-dextroamphetamine (ADDERALL XR) 30 MG 24 hr capsule Take 30 mg by mouth 2 (two) times daily.    Historical Provider, MD  beclomethasone (QVAR) 40 MCG/ACT inhaler Inhale 2 puffs into the lungs 2 (two) times daily as needed (for shortness of breath).     Historical Provider, MD  benzocaine (HURRICAINE) 20 % oral spray Apply 1 application topically 3 (three) times daily as needed for pain.    Historical Provider, MD  cyclobenzaprine (FLEXERIL) 10 MG tablet Take 1 tablet (10 mg total) by mouth 2 (two) times daily as needed for muscle spasms. 02/01/13   Larence Penning, MD  diazepam (VALIUM) 10 MG tablet Take 10 mg by mouth every 6 (six) hours as needed for anxiety.    Historical Provider, MD  divalproex (DEPAKOTE) 500 MG DR tablet Take 1 tablet (500 mg total) by mouth 3 (three) times daily. 07/09/13   Harvie Heck, PA-C  divalproex (DEPAKOTE) 500 MG DR tablet Take 1 tablet (500 mg total) by mouth 3 (three) times daily. Patient not taking: Reported on 08/30/2014 01/06/14   Noemi Chapel, MD  fluticasone [facility]) 50 MCG/ACT nasal spray Place 1 spray into the nose 2 (two) times daily.    Historical Provider, MD  folic acid (FOLVITE) 1 MG tablet Take 1 mg by mouth daily.    Historical Provider, MD  gabapentin (NEURONTIN) 400 MG capsule Take 800 mg by mouth 3 (three) times daily.     Historical Provider, MD  glucose blood (ACCU-CHEK AVIVA) test strip Use as instructed 12/07/14   Delos Haring, PA-C  hydrochlorothiazide (HYDRODIURIL) 25 MG tablet Take 1 tablet (25 mg total) by mouth daily. 07/09/13   Harvie Heck, PA-C  HYDROcodone-acetaminophen (NORCO/VICODIN) 5-325 MG per tablet Take 1 tablet by mouth every 4 (four) hours as needed. 08/06/14   Kristen N Ward, DO  HYDROcodone-acetaminophen (NORCO/VICODIN) 5-325 MG per tablet Take 1-2 tablets by mouth every 6 (six) hours as needed. 10/29/14   Dahlia Bailiff, PA-C  levETIRAcetam (KEPPRA) 1000  MG tablet Take 1 tablet (1,000 mg total) by mouth 2 (two) times daily. 07/09/13   Harvie Heck, PA-C  loratadine (CLARITIN) 10 MG tablet Take 10 mg by mouth daily.  03/03/14   Historical Provider, MD  Mefenamic Acid 250 MG CAPS Take 2 capsules (500 mg total) by mouth 3 (three) times daily. X 4 days 08/30/14   Mercedes Camprubi-Soms, PA-C  meloxicam (MOBIC) 15 MG tablet Take 1 tablet (15 mg total) by mouth daily. 12/07/14   Tegan Burnside Carlota Raspberry, PA-C  methocarbamol (ROBAXIN) 500 MG tablet Take 1 tablet (500 mg total) by mouth 2 (two)  times daily. 12/07/14   Verina Galeno Carlota Raspberry, PA-C  metroNIDAZOLE (METROGEL VAGINAL) 0.75 % vaginal gel Place 1 Applicatorful vaginally 2 (two) times daily. X 5 days 08/30/14   Mercedes Camprubi-Soms, PA-C  mirtazapine (REMERON) 15 MG tablet Take 20 mg by mouth at bedtime.     Historical Provider, MD  omeprazole (PRILOSEC) 20 MG capsule Take 20 mg by mouth 2 (two) times daily.    Historical Provider, MD  ondansetron (ZOFRAN ODT) 4 MG disintegrating tablet Take 1 tablet (4 mg total) by mouth every 8 (eight) hours as needed for nausea or vomiting. 08/06/14   Kristen N Ward, DO  oxyCODONE-acetaminophen (PERCOCET) 10-325 MG per tablet Take 1 tablet by mouth every 4 (four) hours as needed for pain.    Historical Provider, MD  PARoxetine (PAXIL) 40 MG tablet Take 1 tablet (40 mg total) by mouth daily. 07/09/13   Lauren Parker, PA-C  Phenylephrine-DM-GG-APAP (MUCINEX FAST-MAX CONGEST COLD) 5-10-200-325 MG TABS Take 2 tablets by mouth 3 (three) times daily. Patient not taking: Reported on 08/30/2014 08/02/14   Britt Bottom, NP  promethazine (PHENERGAN) 6.25 MG/5ML syrup Take 12.5 mg by mouth every 6 (six) hours as needed for nausea or vomiting.    Historical Provider, MD  propranolol (INDERAL) 10 MG tablet Take 10 mg by mouth 2 (two) times daily.  01/28/14   Historical Provider, MD  sodium chloride (OCEAN) 0.65 % nasal spray Place 1 spray into the nose 2 (two) times daily as needed for  congestion.     Historical Provider, MD  triamcinolone ointment (KENALOG) 0.5 % Apply 1 application topically 2 (two) times daily.    Historical Provider, MD   BP 138/95 mmHg  Pulse 93  Temp(Src) 98.3 F (36.8 C)  Resp 16  Ht 5\' 5"  (1.651 m)  Wt 250 lb (113.399 kg)  BMI 41.60 kg/m2  SpO2 100%  LMP 11/16/2014 Physical Exam  Constitutional: She is oriented to person, place, and time. She appears well-developed and well-nourished. No distress.  HENT:  Head: Normocephalic and atraumatic.  Eyes: Conjunctivae are normal.  Neck: Normal range of motion.  Cardiovascular: Normal rate.   Pulmonary/Chest: Effort normal.  Musculoskeletal: Normal range of motion.  Pt has equal strength to bilateral lower extremities.  Neurosensory function adequate to both legs No clonus on dorsiflextion Skin color is normal. Skin is warm and moist.  I see no step off deformity, no midline bony tenderness.  Pt is able to ambulate.  No crepitus, laceration, effusion, induration, lesions, swelling.   Pedal pulses are symmetrical and palpable bilaterally  Tender to palpation of paraspinel muscles across the lower lumbar region.   Neurological: She is alert and oriented to person, place, and time.  Skin: Skin is warm and dry.  Psychiatric: She has a normal mood and affect. Her behavior is normal.  Nursing note and vitals reviewed.   ED Course  Procedures   DIAGNOSTIC STUDIES:  Oxygen Saturation is 100% on RA, normal by my interpretation.    COORDINATION OF CARE:  10:38 AM Will administer shot of toradol and dose of robaxin in the ED. Will discharge pt with  RX for mobic and robaxin for home. Pt requests refill of accucheck strips. Discussed treatment plan with pt at bedside and pt agreed to plan. She reports allergy to Ibuprofen in her chart but has been taking Aleve at home without any difficulties.  Labs Review Labs Reviewed - No data to display  Imaging Review No results found. I have  personally reviewed and  evaluated these images and lab results as part of my medical decision-making.   EKG Interpretation None      MDM   Final diagnoses:  Bilateral low back pain without sciatica    39 y.o.Tanee Sheer's  with back pain. No neurological deficits and normal neuro exam. Patient can walk. No loss of bowel or bladder control. No concern for cauda equina at this time base on HPI and physical exam findings. No fever, night sweats, weight loss, h/o cancer, IVDU.   Patient Plan 1. Medications: NSAIDs and muscle relaxer. Cont usual home medications unless otherwise directed. 2. Treatment: rest, drink plenty of fluids, gentle stretching as discussed, alternate ice and heat  3. Follow Up: Please followup with your primary doctor for discussion of your diagnoses and further evaluation after today's visit; if you do not have a primary care doctor use the resource guide provided to find one  Advised to follow-up with the orthopedist if symptoms do not start to resolve in the next 2-3 days. If develop loss of bowel or urinary control return to the ED as soon as possible for further evaluation. To take the medications as prescribed as they can cause harm if not taken appropriately.   Vital signs are stable at discharge. Filed Vitals:   12/07/14 0927  BP: 138/95  Pulse: 93  Temp: 98.3 F (36.8 C)  Resp: 16    Patient/guardian has voiced understanding and agreed to follow-up with the PCP or specialist.  I personally performed the services described in this documentation, which was scribed in my presence. The recorded information has been reviewed and is accurate.    Delos Haring, PA-C 12/07/14 [place], MD 12/07/14 2232

## 2014-12-10 ENCOUNTER — Encounter (HOSPITAL_COMMUNITY): Payer: Self-pay | Admitting: Emergency Medicine

## 2014-12-10 ENCOUNTER — Inpatient Hospital Stay (HOSPITAL_COMMUNITY)
Admission: EM | Admit: 2014-12-10 | Discharge: 2014-12-10 | DRG: 101 | Discharge status: Against Medical Advice | Payer: Medicaid Other | Attending: Internal Medicine | Admitting: Internal Medicine

## 2014-12-10 DIAGNOSIS — Z79891 Long term (current) use of opiate analgesic: Secondary | ICD-10-CM | POA: Diagnosis not present

## 2014-12-10 DIAGNOSIS — I10 Essential (primary) hypertension: Secondary | ICD-10-CM | POA: Diagnosis present

## 2014-12-10 DIAGNOSIS — W19XXXA Unspecified fall, initial encounter: Secondary | ICD-10-CM | POA: Diagnosis present

## 2014-12-10 DIAGNOSIS — R569 Unspecified convulsions: Secondary | ICD-10-CM

## 2014-12-10 DIAGNOSIS — J45909 Unspecified asthma, uncomplicated: Secondary | ICD-10-CM | POA: Diagnosis present

## 2014-12-10 DIAGNOSIS — F1290 Cannabis use, unspecified, uncomplicated: Secondary | ICD-10-CM | POA: Diagnosis present

## 2014-12-10 DIAGNOSIS — G40419 Other generalized epilepsy and epileptic syndromes, intractable, without status epilepticus: Principal | ICD-10-CM | POA: Diagnosis present

## 2014-12-10 DIAGNOSIS — E875 Hyperkalemia: Secondary | ICD-10-CM | POA: Diagnosis present

## 2014-12-10 DIAGNOSIS — G40409 Other generalized epilepsy and epileptic syndromes, not intractable, without status epilepticus: Secondary | ICD-10-CM

## 2014-12-10 DIAGNOSIS — Z6841 Body Mass Index (BMI) 40.0 and over, adult: Secondary | ICD-10-CM

## 2014-12-10 DIAGNOSIS — G8929 Other chronic pain: Secondary | ICD-10-CM | POA: Diagnosis present

## 2014-12-10 DIAGNOSIS — M25562 Pain in left knee: Secondary | ICD-10-CM | POA: Diagnosis present

## 2014-12-10 DIAGNOSIS — Z9119 Patient's noncompliance with other medical treatment and regimen: Secondary | ICD-10-CM | POA: Diagnosis present

## 2014-12-10 DIAGNOSIS — D329 Benign neoplasm of meninges, unspecified: Secondary | ICD-10-CM | POA: Diagnosis present

## 2014-12-10 DIAGNOSIS — R911 Solitary pulmonary nodule: Secondary | ICD-10-CM | POA: Diagnosis present

## 2014-12-10 DIAGNOSIS — K219 Gastro-esophageal reflux disease without esophagitis: Secondary | ICD-10-CM | POA: Diagnosis present

## 2014-12-10 DIAGNOSIS — F17210 Nicotine dependence, cigarettes, uncomplicated: Secondary | ICD-10-CM | POA: Diagnosis present

## 2014-12-10 DIAGNOSIS — Z886 Allergy status to analgesic agent status: Secondary | ICD-10-CM

## 2014-12-10 DIAGNOSIS — Z21 Asymptomatic human immunodeficiency virus [HIV] infection status: Secondary | ICD-10-CM | POA: Diagnosis present

## 2014-12-10 DIAGNOSIS — Z91040 Latex allergy status: Secondary | ICD-10-CM | POA: Diagnosis not present

## 2014-12-10 DIAGNOSIS — Z888 Allergy status to other drugs, medicaments and biological substances status: Secondary | ICD-10-CM

## 2014-12-10 DIAGNOSIS — J4520 Mild intermittent asthma, uncomplicated: Secondary | ICD-10-CM

## 2014-12-10 DIAGNOSIS — M549 Dorsalgia, unspecified: Secondary | ICD-10-CM | POA: Diagnosis present

## 2014-12-10 DIAGNOSIS — F339 Major depressive disorder, recurrent, unspecified: Secondary | ICD-10-CM | POA: Diagnosis present

## 2014-12-10 DIAGNOSIS — F419 Anxiety disorder, unspecified: Secondary | ICD-10-CM | POA: Diagnosis present

## 2014-12-10 DIAGNOSIS — R112 Nausea with vomiting, unspecified: Secondary | ICD-10-CM | POA: Diagnosis present

## 2014-12-10 DIAGNOSIS — R0789 Other chest pain: Secondary | ICD-10-CM | POA: Diagnosis present

## 2014-12-10 DIAGNOSIS — R079 Chest pain, unspecified: Secondary | ICD-10-CM | POA: Diagnosis present

## 2014-12-10 DIAGNOSIS — Z79899 Other long term (current) drug therapy: Secondary | ICD-10-CM | POA: Diagnosis not present

## 2014-12-10 DIAGNOSIS — Z86718 Personal history of other venous thrombosis and embolism: Secondary | ICD-10-CM

## 2014-12-10 DIAGNOSIS — M79605 Pain in left leg: Secondary | ICD-10-CM | POA: Diagnosis present

## 2014-12-10 DIAGNOSIS — F17213 Nicotine dependence, cigarettes, with withdrawal: Secondary | ICD-10-CM | POA: Diagnosis present

## 2014-12-10 DIAGNOSIS — B20 Human immunodeficiency virus [HIV] disease: Secondary | ICD-10-CM

## 2014-12-10 DIAGNOSIS — M79606 Pain in leg, unspecified: Secondary | ICD-10-CM

## 2014-12-10 DIAGNOSIS — R197 Diarrhea, unspecified: Secondary | ICD-10-CM

## 2014-12-10 LAB — CBC WITH DIFFERENTIAL/PLATELET
Basophils Absolute: 0.1 10*3/uL (ref 0.0–0.1)
Basophils Relative: 1 % (ref 0–1)
Eosinophils Absolute: 0.1 10*3/uL (ref 0.0–0.7)
Eosinophils Relative: 1 % (ref 0–5)
HCT: 40.2 % (ref 36.0–46.0)
Hemoglobin: 13.7 g/dL (ref 12.0–15.0)
Lymphocytes Relative: 34 % (ref 12–46)
Lymphs Abs: 2.5 10*3/uL (ref 0.7–4.0)
MCH: 32.5 pg (ref 26.0–34.0)
MCHC: 34.1 g/dL (ref 30.0–36.0)
MCV: 95.3 fL (ref 78.0–100.0)
Monocytes Absolute: 0.4 10*3/uL (ref 0.1–1.0)
Monocytes Relative: 5 % (ref 3–12)
Neutro Abs: 4.3 10*3/uL (ref 1.7–7.7)
Neutrophils Relative %: 59 % (ref 43–77)
Platelets: 201 10*3/uL (ref 150–400)
RBC: 4.22 MIL/uL (ref 3.87–5.11)
RDW: 14.2 % (ref 11.5–15.5)
WBC: 7.4 10*3/uL (ref 4.0–10.5)

## 2014-12-10 LAB — CBG MONITORING, ED: Glucose-Capillary: 92 mg/dL (ref 65–99)

## 2014-12-10 LAB — BASIC METABOLIC PANEL
Anion gap: 11 (ref 5–15)
BUN: 10 mg/dL (ref 6–20)
CO2: 21 mmol/L — ABNORMAL LOW (ref 22–32)
Calcium: 9.2 mg/dL (ref 8.9–10.3)
Chloride: 102 mmol/L (ref 101–111)
Creatinine, Ser: 0.92 mg/dL (ref 0.44–1.00)
GFR calc Af Amer: >60 mL/min (ref >60)
GFR calc non Af Amer: >60 mL/min (ref >60)
Glucose, Bld: 88 mg/dL (ref 65–99)
Potassium: 5.9 mmol/L — ABNORMAL HIGH (ref 3.5–5.1)
Sodium: 134 mmol/L — ABNORMAL LOW (ref 135–145)

## 2014-12-10 LAB — POTASSIUM: Potassium: 4.3 mmol/L (ref 3.5–5.1)

## 2014-12-10 LAB — VALPROIC ACID LEVEL: Valproic Acid Lvl: 28 ug/mL — ABNORMAL LOW (ref 50.0–100.0)

## 2014-12-10 MED ORDER — HYDRALAZINE HCL 20 MG/ML IJ SOLN: 5.0000 mg | INTRAMUSCULAR | Status: DC | PRN

## 2014-12-10 MED ORDER — PAROXETINE HCL 20 MG PO TABS: 40.0000 mg | ORAL_TABLET | Freq: Every day | ORAL | Status: DC

## 2014-12-10 MED ORDER — NICOTINE 21 MG/24HR TD PT24: 21.0000 mg | MEDICATED_PATCH | Freq: Every day | TRANSDERMAL | Status: DC

## 2014-12-10 MED ORDER — TRIAMCINOLONE ACETONIDE 0.5 % EX OINT
1.0000 "application " | TOPICAL_OINTMENT | Freq: Two times a day (BID) | CUTANEOUS | Status: DC
  Filled 2014-12-10: qty 15

## 2014-12-10 MED ORDER — SODIUM CHLORIDE 0.9 % IJ SOLN: 3.0000 mL | Freq: Two times a day (BID) | INTRAMUSCULAR | Status: DC

## 2014-12-10 MED ORDER — METHOCARBAMOL 500 MG PO TABS: 500.0000 mg | ORAL_TABLET | Freq: Two times a day (BID) | ORAL | Status: DC

## 2014-12-10 MED ORDER — DM-GUAIFENESIN ER 30-600 MG PO TB12: 1.0000 | ORAL_TABLET | Freq: Two times a day (BID) | ORAL | Status: DC

## 2014-12-10 MED ORDER — ALBUTEROL SULFATE HFA 108 (90 BASE) MCG/ACT IN AERS: 2.0000 | INHALATION_SPRAY | RESPIRATORY_TRACT | Status: DC | PRN

## 2014-12-10 MED ORDER — SODIUM CHLORIDE 0.9 % IV SOLN
INTRAVENOUS | Status: DC
  Administered 2014-12-10: 100 mL/h via INTRAVENOUS

## 2014-12-10 MED ORDER — FAMOTIDINE IN NACL 20-0.9 MG/50ML-% IV SOLN: 20.0000 mg | Freq: Two times a day (BID) | INTRAVENOUS | Status: DC

## 2014-12-10 MED ORDER — OXYCODONE-ACETAMINOPHEN 10-325 MG PO TABS
1.0000 | ORAL_TABLET | ORAL | Status: DC | PRN
Start: 2014-12-10 — End: 2014-12-10

## 2014-12-10 MED ORDER — MIRTAZAPINE 7.5 MG PO TABS
20.0000 mg | ORAL_TABLET | Freq: Every day | ORAL | Status: DC
  Filled 2014-12-10: qty 1

## 2014-12-10 MED ORDER — BUDESONIDE 0.25 MG/2ML IN SUSP: 2.0000 mL | Freq: Two times a day (BID) | RESPIRATORY_TRACT | Status: DC | PRN

## 2014-12-10 MED ORDER — FLUTICASONE PROPIONATE 50 MCG/ACT NA SUSP
1.0000 | Freq: Every day | NASAL | Status: DC
  Filled 2014-12-10: qty 16

## 2014-12-10 MED ORDER — VALPROATE SODIUM 500 MG/5ML IV SOLN
500.0000 mg | Freq: Once | INTRAVENOUS | Status: AC
  Administered 2014-12-10: 500 mg via INTRAVENOUS
  Filled 2014-12-10: qty 5

## 2014-12-10 MED ORDER — FOLIC ACID 1 MG PO TABS: 1.0000 mg | ORAL_TABLET | Freq: Every day | ORAL | Status: DC

## 2014-12-10 MED ORDER — DIVALPROEX SODIUM 500 MG PO DR TAB
500.0000 mg | DELAYED_RELEASE_TABLET | ORAL | Status: DC
  Filled 2014-12-10 (×3): qty 1

## 2014-12-10 MED ORDER — MORPHINE SULFATE (PF) 2 MG/ML IV SOLN: 2.0000 mg | INTRAVENOUS | Status: DC | PRN

## 2014-12-10 MED ORDER — ABACAVIR SULFATE-LAMIVUDINE 600-300 MG PO TABS
1.0000 | ORAL_TABLET | Freq: Every day | ORAL | Status: DC
  Filled 2014-12-10: qty 1

## 2014-12-10 MED ORDER — DIVALPROEX SODIUM 500 MG PO DR TAB: 500.0000 mg | DELAYED_RELEASE_TABLET | Freq: Three times a day (TID) | ORAL | Status: DC

## 2014-12-10 MED ORDER — BENZOCAINE (TOPICAL) 20 % EX AERO: 1.0000 "application " | INHALATION_SPRAY | Freq: Three times a day (TID) | CUTANEOUS | Status: DC | PRN

## 2014-12-10 MED ORDER — ALBUTEROL SULFATE (2.5 MG/3ML) 0.083% IN NEBU: 2.5000 mg | INHALATION_SOLUTION | Freq: Four times a day (QID) | RESPIRATORY_TRACT | Status: DC | PRN

## 2014-12-10 MED ORDER — OXYCODONE-ACETAMINOPHEN 5-325 MG PO TABS: 1.0000 | ORAL_TABLET | ORAL | Status: DC | PRN

## 2014-12-10 MED ORDER — LORATADINE 10 MG PO TABS: 10.0000 mg | ORAL_TABLET | Freq: Every day | ORAL | Status: DC

## 2014-12-10 MED ORDER — OXYCODONE HCL 5 MG PO TABS: 5.0000 mg | ORAL_TABLET | ORAL | Status: DC | PRN

## 2014-12-10 MED ORDER — LEVETIRACETAM 500 MG PO TABS: 1000.0000 mg | ORAL_TABLET | Freq: Two times a day (BID) | ORAL | Status: DC

## 2014-12-10 MED ORDER — ABACAVIR-DOLUTEGRAVIR-LAMIVUD 600-50-300 MG PO TABS: 1.0000 | ORAL_TABLET | Freq: Every day | ORAL | Status: DC

## 2014-12-10 MED ORDER — LORAZEPAM 2 MG/ML IJ SOLN
1.0000 mg | Freq: Once | INTRAMUSCULAR | Status: AC
  Administered 2014-12-10: 1 mg via INTRAVENOUS
  Filled 2014-12-10: qty 1

## 2014-12-10 MED ORDER — LEVETIRACETAM 500 MG/5ML IV SOLN
500.0000 mg | Freq: Once | INTRAVENOUS | Status: AC
  Administered 2014-12-10: 500 mg via INTRAVENOUS
  Filled 2014-12-10: qty 5

## 2014-12-10 MED ORDER — HEPARIN SODIUM (PORCINE) 5000 UNIT/ML IJ SOLN
5000.0000 [IU] | Freq: Three times a day (TID) | INTRAMUSCULAR | Status: DC
Start: 2014-12-10 — End: 2014-12-10

## 2014-12-10 MED ORDER — ALPRAZOLAM 0.5 MG PO TABS: 2.0000 mg | ORAL_TABLET | Freq: Three times a day (TID) | ORAL | Status: DC | PRN

## 2014-12-10 MED ORDER — LAMIVUDINE 150 MG PO TABS
300.0000 mg | ORAL_TABLET | Freq: Every day | ORAL | Status: DC
  Filled 2014-12-10: qty 2

## 2014-12-10 MED ORDER — ABACAVIR SULFATE 300 MG PO TABS
600.0000 mg | ORAL_TABLET | Freq: Every day | ORAL | Status: DC
  Filled 2014-12-10: qty 2

## 2014-12-10 MED ORDER — DOLUTEGRAVIR SODIUM 50 MG PO TABS
50.0000 mg | ORAL_TABLET | Freq: Every day | ORAL | Status: DC
  Filled 2014-12-10: qty 1

## 2014-12-10 MED ORDER — PROPRANOLOL HCL 10 MG PO TABS
10.0000 mg | ORAL_TABLET | Freq: Two times a day (BID) | ORAL | Status: DC
  Filled 2014-12-10 (×2): qty 1

## 2014-12-10 MED ORDER — DIVALPROEX SODIUM 500 MG PO DR TAB
750.0000 mg | DELAYED_RELEASE_TABLET | Freq: Every day | ORAL | Status: DC
  Filled 2014-12-10: qty 1

## 2014-12-10 MED ORDER — DIVALPROEX SODIUM 500 MG PO DR TAB
500.0000 mg | DELAYED_RELEASE_TABLET | Freq: Three times a day (TID) | ORAL | Status: DC
  Filled 2014-12-10 (×3): qty 1

## 2014-12-10 MED ORDER — CYCLOBENZAPRINE HCL 10 MG PO TABS: 10.0000 mg | ORAL_TABLET | Freq: Two times a day (BID) | ORAL | Status: DC | PRN

## 2014-12-10 MED ORDER — LORAZEPAM 2 MG/ML IJ SOLN: 1.0000 mg | INTRAMUSCULAR | Status: DC | PRN

## 2014-12-10 MED ORDER — GABAPENTIN 400 MG PO CAPS: 800.0000 mg | ORAL_CAPSULE | Freq: Three times a day (TID) | ORAL | Status: DC

## 2014-12-10 MED ORDER — NITROGLYCERIN 0.4 MG SL SUBL: 0.4000 mg | SUBLINGUAL_TABLET | SUBLINGUAL | Status: DC | PRN

## 2014-12-10 MED ORDER — SALINE SPRAY 0.65 % NA SOLN: 1.0000 | NASAL | Status: DC | PRN

## 2014-12-10 MED ORDER — SODIUM CHLORIDE 0.9 % IV SOLN: INTRAVENOUS | Status: DC

## 2014-12-10 MED ORDER — DIAZEPAM 5 MG PO TABS: 10.0000 mg | ORAL_TABLET | Freq: Four times a day (QID) | ORAL | Status: DC | PRN

## 2014-12-10 NOTE — Progress Notes (Signed)
Pt felt as if she was not treated properly, became irrate and making racial remarks; security called. Patient given paper scrubs and escorted off the property.

## 2014-12-10 NOTE — Progress Notes (Signed)
Pt refused MRSA swab.  States "I'm refusing all treatment until I get treated for what I came in for"

## 2014-12-10 NOTE — H&P (Deleted)
AMA  Patient at this time expresses desire to leave the Hospital immidiately, patient has been warned that this is not Medically advisable at this time, and can result in Medical complications like Death and Disability, patient understands and accepts the risks involved and assumes full responsibilty of this decision.  Clearly advised her - Do not drive, operating heavy machinery, perform activities at heights, swimming or participation in water activities or provide baby sitting services until you have seen by Primary MD or a Neurologist and advised to do so again.   Question her to be compliant with her seizure medications and to follow with her neurologist within a week along with the neurosurgeon. She refuses to undergo any further testing or medication adjustment, she appears competent to make her decision. Again after much discussion, clear warning she is wanting to leave AMA.    [name] M.D on 12/10/2014 at 9:05 AM  [facility]    Last Note Below    [facility] History and Physical  Lori Ford AYT:[id number] DOB: 1976-01-12 DOA: 12/10/2014  Referring physician: ED physician PCP: Lori Mccreedy, MD  Specialists:   Chief Complaint: Seizure, chest pain, shortness of breath, diarrhea, leg pain  HPI: Lori Ford is a 39 y.o. female with PMH of hypertension, GERD, depression, anxiety, seizure, asthma, brain surgery due to meningioma, tobacco abuse, HIV, who presents with a seizure, chest pain, diarrhea, leg pain.  Patient called EMS for shortness of breath tonight. Per triage note, pt had a Grand Mal seizure upon EMS arrival. She then had 2 other seizures en route. Nurse mentions that pt has had 3 more seizures while in the ED. Pt was given 5 mg total Midazolam by EMS. Patient is on Depakote and Keppra at  home. Per ED, husband does not believe that pt has missed any dosage of her seizure medications.   Patient also reports that she has shortness of breath, cough with yellow sputum production and chest pain, which have been going on for 3 days. She cannot describe the details of her chest pain since she is in postictal status. She states that she has been having diarrhea in the past 7 days. She has approximately 6-7 bowel movements with loose stools each day. She had nausea, vomiting and abdominal pain yesterday, which have resolved. He also reports that she had a fall and injured her left leg, knee and hip on 10/29/14. Currently she has pain over left lower leg and knee. Chart review showed that she had negative x-ray of left knee, right wrist and left hip on 10/29/14.  In ED, patient was found to have  VPA level low at 28, potassium of 5.9 without EKG changes, the repeated K is 4.3. WBC 7.4, no tachycardia, temperature normal, renal function okay. Patient is admitted to inpatient for further evaluation and treatment. Neurology was consulted.  Where does patient live?   At home  Can patient participate in ADLs?  Yes   Review of Systems:   General: no fevers, chills, no changes in body weight, has poor appetite, has fatigue HEENT: no blurry vision, hearing changes or sore throat Pulm: has dyspnea, coughing, no wheezing  CV: has chest pain, no palpitations Abd: has nausea, vomiting, abdominal pain, diarrhea, no constipation GU: no dysuria, burning on urination, increased urinary frequency, hematuria  Ext: no leg edema. Neuro: no unilateral weakness, numbness, or tingling, no vision change or hearing loss Skin: no rash MSK: No muscle spasm, no deformity, no limitation of range of movement in spin. Has left knee and lower leg pain Heme: No easy bruising.  Travel history: No recent long distant travel.  Allergy:  Allergies  Allergen Reactions  . Ibuprofen Shortness Of Breath    wheezing  .  Clindamycin/Lincomycin Hives  . Coconut Flavor Hives  . Flagyl [Metronidazole] Other (See Comments)    "it gave me a real bad bacterial infection"  . Latex Other (See Comments)    Rash, wheezing  . Other Swelling    "GRAPE SUBSTANCE"  . Aspirin Palpitations    wheezing    Past Medical History  Diagnosis Date  . 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 Seizures     due to head trauma as adult  . Asthma     as child  . Meningioma   . Gallstones     s/p cholecystectomy  . HTN (hypertension)   . GERD (gastroesophageal reflux disease)   . Allergic rhinitis   . Cigarette nicotine dependence with withdrawal   . DVT (deep venous thrombosis)   . Chronic back pain   . Sciatic pain   . Arthritis   . HIV (human immunodeficiency virus infection)     Past Surgical History  Procedure Laterality Date  . Brain surgery      2013 to remove a meningioma  . Cholecystectomy      Social History:  reports that she has been smoking Cigarettes.  She has a 4.5 pack-year smoking history. She has never used smokeless tobacco. She reports that she uses illicit drugs (Marijuana). She reports that she does not drink alcohol.  Family History:  Family History  Problem Relation Age of Onset  . Other Mother     varicose vein  . Asthma Mother   . High blood pressure Mother   . Deep vein thrombosis Neg Hx   . Pulmonary embolism Neg Hx   . Cancer    . Diabetes    . High blood pressure    . Asthma    . Thyroid disease    . Cancer Father      Prior to Admission medications   Medication Sig Start Date End Date Taking? Authorizing Provider  Abacavir-Dolutegravir-Lamivud (TRIUMEQ) 600-50-300 MG TABS Take 1 tablet by mouth daily. 06/23/14   Lori Hayward, MD  acetaminophen (TYLENOL) 500 MG tablet Take 1,000 mg by mouth every 6 (six) hours as needed for moderate pain or headache.    Historical Provider, MD  albuterol (PROVENTIL HFA;VENTOLIN HFA) 108 (90 BASE) MCG/ACT inhaler Inhale 2 puffs  into the lungs every 4 (four) hours as needed for wheezing or shortness of breath. 08/02/14   Lori Bottom, NP  albuterol (PROVENTIL) (2.5 MG/3ML) 0.083% nebulizer solution Take 2.5 mg by nebulization every 6 (six) hours as needed for wheezing or shortness of breath.    Historical Provider, MD  alprazolam Duanne Moron) 2 MG tablet Take 2 mg by mouth 3 (three) times daily as needed for sleep.    Historical Provider, MD  amphetamine-dextroamphetamine (ADDERALL XR) 30 MG 24 hr capsule Take 30 mg by mouth 2 (two) times daily.    Historical Provider, MD  beclomethasone (QVAR) 40 MCG/ACT inhaler Inhale 2 puffs into the lungs 2 (two) times daily as needed (for shortness of breath).     Historical Provider, MD  benzocaine (HURRICAINE) 20 % oral spray Apply 1 application topically 3 (three) times daily as needed for pain.    Historical Provider, MD  cyclobenzaprine (FLEXERIL) 10 MG tablet Take 1 tablet (10 mg total) by mouth 2 (two) times daily as needed for muscle spasms. 02/01/13   Larence Penning, MD  diazepam (VALIUM) 10 MG tablet Take 10 mg by mouth every 6 (six) hours as needed for anxiety.    Historical Provider, MD  divalproex (DEPAKOTE) 500 MG DR tablet Take 1 tablet (500 mg total) by mouth 3 (three) times daily. 07/09/13   Harvie Heck, PA-C  divalproex (DEPAKOTE) 500 MG DR tablet Take 1 tablet (500 mg total) by mouth 3 (three) times daily. Patient not taking: Reported on 08/30/2014 01/06/14   Noemi Chapel, MD  fluticasone [facility]) 50 MCG/ACT nasal spray Place 1 spray into the nose 2 (two) times daily.    Historical Provider, MD  folic acid (FOLVITE) 1 MG tablet Take 1 mg by mouth daily.    Historical Provider, MD  gabapentin (NEURONTIN) 400 MG capsule Take 800 mg by mouth 3 (three) times daily.     Historical Provider, MD  glucose blood (ACCU-CHEK AVIVA) test strip Use as instructed 12/07/14   Delos Haring, PA-C  hydrochlorothiazide (HYDRODIURIL) 25 MG tablet Take 1 tablet (25 mg total) by mouth daily.  07/09/13   Harvie Heck, PA-C  HYDROcodone-acetaminophen (NORCO/VICODIN) 5-325 MG per tablet Take 1 tablet by mouth every 4 (four) hours as needed. 08/06/14   Kristen N Ward, DO  HYDROcodone-acetaminophen (NORCO/VICODIN) 5-325 MG per tablet Take 1-2 tablets by mouth every 6 (six) hours as needed. 10/29/14   Dahlia Bailiff, PA-C  levETIRAcetam (KEPPRA) 1000 MG tablet Take 1 tablet (1,000 mg total) by mouth 2 (two) times daily. 07/09/13   Harvie Heck, PA-C  loratadine (CLARITIN) 10 MG tablet Take 10 mg by mouth daily.  03/03/14   Historical Provider, MD  Mefenamic Acid 250 MG CAPS Take 2 capsules (500 mg total) by mouth 3 (three) times daily. X 4 days 08/30/14   Mercedes Camprubi-Soms, PA-C  meloxicam (MOBIC) 15 MG tablet Take 1 tablet (15 mg total) by mouth daily. 12/07/14   Tiffany Carlota Raspberry, PA-C  methocarbamol (ROBAXIN) 500 MG tablet Take 1 tablet (500 mg total) by mouth 2 (two) times daily. 12/07/14   Tiffany Carlota Raspberry, PA-C  metroNIDAZOLE (METROGEL VAGINAL) 0.75 % vaginal gel Place 1 Applicatorful vaginally 2 (two) times daily. X 5 days 08/30/14   Mercedes Camprubi-Soms, PA-C  mirtazapine (REMERON) 15 MG tablet Take 20 mg by mouth at bedtime.     Historical Provider, MD  omeprazole (PRILOSEC) 20 MG capsule Take 20 mg by mouth 2 (two) times daily.    Historical Provider, MD  ondansetron (ZOFRAN ODT) 4 MG disintegrating tablet Take 1 tablet (4 mg total) by mouth every 8 (eight) hours as needed for nausea or vomiting. 08/06/14   Kristen N Ward, DO  oxyCODONE-acetaminophen (PERCOCET) 10-325 MG per tablet Take 1 tablet by mouth every 4 (four) hours as needed for pain.    Historical Provider, MD  PARoxetine (PAXIL) 40 MG tablet Take 1 tablet (40 mg total) by mouth daily. 07/09/13   Lauren Parker, PA-C  Phenylephrine-DM-GG-APAP (MUCINEX FAST-MAX CONGEST COLD) 5-10-200-325 MG TABS Take 2 tablets by mouth 3 (three) times daily. Patient not taking: Reported on 08/30/2014 08/02/14  Lori Bottom, NP  promethazine  (PHENERGAN) 6.25 MG/5ML syrup Take 12.5 mg by mouth every 6 (six) hours as needed for nausea or vomiting.    Historical Provider, MD  propranolol (INDERAL) 10 MG tablet Take 10 mg by mouth 2 (two) times daily.  01/28/14   Historical Provider, MD  sodium chloride (OCEAN) 0.65 % nasal spray Place 1 spray into the nose 2 (two) times daily as needed for congestion.     Historical Provider, MD  triamcinolone ointment (KENALOG) 0.5 % Apply 1 application topically 2 (two) times daily.    Historical Provider, MD    Physical Exam: Filed Vitals:   12/10/14 0515 12/10/14 0530 12/10/14 0545 12/10/14 0616  BP: 121/71 108/76 116/76 122/91  Pulse: 93 95 97 86  Temp:    98.6 F (37 C)  TempSrc:    Oral  Resp: 21 17 22 23   Height:    5\' 4"  (1.626 m)  Weight:    114 kg (251 lb 5.2 oz)  SpO2: 100% 100% 100% 100%   General: Not in acute distress HEENT:       Eyes: PERRL, EOMI, no scleral icterus.       ENT: No discharge from the ears and nose, no pharynx injection, no tonsillar enlargement.        Neck: No JVD, no bruit, no mass felt. Heme: No neck lymph node enlargement. Cardiac: S1/S2, RRR, No murmurs, No gallops or rubs. Pulm: No rales, wheezing, rhonchi or rubs. There is tenderness over mid chest wall. Abd: Soft, nondistended, nontender, no rebound pain, no organomegaly, BS present. Ext: No pitting leg edema bilaterally. 2+DP/PT pulse bilaterally. Musculoskeletal: No joint deformities, No joint redness or warmth, no limitation of ROM in spin. Has tenderness over left knee and left lower leg, without redness or swelling. Skin: No rashes.  Neuro: Postictal status, oriented X3, cranial nerves II-XII grossly intact, muscle strength 5/5 in all extremities, sensation to light touch intact. Brachial reflex 1+ bilaterally. Knee reflex 1+ bilaterally. Negative Babinski's sign.  Psych: Patient is not psychotic, no suicidal or hemocidal ideation.  Labs on Admission:  Basic Metabolic Panel:  Recent  Labs Lab 12/10/14 0214 12/10/14 0515  NA 134*  --   K 5.9* 4.3  CL 102  --   CO2 21*  --   GLUCOSE 88  --   BUN 10  --   CREATININE 0.92  --   CALCIUM 9.2  --    Liver Function Tests: No results for input(s): AST, ALT, ALKPHOS, BILITOT, PROT, ALBUMIN in the last 168 hours. No results for input(s): LIPASE, AMYLASE in the last 168 hours. No results for input(s): AMMONIA in the last 168 hours. CBC:  Recent Labs Lab 12/10/14 0214  WBC 7.4  NEUTROABS 4.3  HGB 13.7  HCT 40.2  MCV 95.3  PLT 201   Cardiac Enzymes: No results for input(s): CKTOTAL, CKMB, CKMBINDEX, TROPONINI in the last 168 hours.  BNP (last 3 results) No results for input(s): BNP in the last 8760 hours.  ProBNP (last 3 results) No results for input(s): PROBNP in the last 8760 hours.  CBG:  Recent Labs Lab 12/10/14 0309  GLUCAP 92    Radiological Exams on Admission: No results found.  EKG: Not done in ED, will get one.   Assessment/Plan Principal Problem:   Seizure disorder, grand mal Active Problems:   73mm Pulmonary nodule, right upper lobe.  repeat CT chest May 2015.   Asthma   Hypertension   GERD (gastroesophageal  reflux disease)   Cigarette nicotine dependence with withdrawal   Morbid obesity   HIV disease   Meningioma   Major depression, recurrent, chronic   Chest pain   Chest wall pain   Left leg pain   Left knee pain   Nausea vomiting and diarrhea   Seizures   Seizure   Hyperkalemia  Seizure disorder, grand mal: It is likely due to subtherapeutic level of anti-seizure medications. Neurology was consulted, Dr. Janann Colonel saw patient. Per Dr. Janann Colonel, recent head CT at [facility] which showed return of meningioma, but no imaging available.   -will admit SDU -Appreciate Dr. Hazle Quant consultation. Will follow-up recommendations as follows:  -MRI brain with and without  -EEG  -continue keppra 1000mg  BID  -increase VPA to 500-500-750  -continue gabapentin 800mg  TID  -seizure  precautions  Asthma: Patient has shortness of breath, but no wheezing or rhonchi on auscultations. Does not seem to have acute exacerbation. -Albuterol nebs prn -Pulmicort -Mucinex for cough  HTN:  -When necessary hydralazine -Hold HCTZ since patient is clinically dry -On propranolol  GERD: -switch PPI to pepcid until c diff pcr negative  Tobacco abuse and Alcohol abuse: -Did counseling about importance of quitting smoking -Nicotine patch  HIV disease: controled. CD4 830 and VL 1329 on 07/20/14 -continue home HIV meds: Triumeq  hx of Meningioma: s/p surgery.  Per Dr. Janann Colonel, recent head CT at [facility] which showed return of meningioma, but no imaging available. -f/u MRI -brain   Depression and anxiety:  no suicidal or homicidal ideations. -Continue home medications  Chest pain, SOB, cough: Etiology is not clear. pt also has chest wall pain. Patient cannot describe it in detail. Will rule out pneumonia and ACS -CXR. If trop and CXR negative, may consider to r/o PE -trop x 3 -prn NTG and morphine -no ASA since pt is allergic -check A1c and FLP -EKG  Left leg pain and Left knee pain: had negative x-ray of left knee on 10/29/14.  -prn percocet -X-ray of left tibia/fibula  Nausea vomiting and diarrhea: unclear etiology. Likely due to viral gastroenteritis, but needs to rule out other possibilities, such as C. difficile colitis. -IVF: NS 100cc/h -check c diff pcr  Hyperkalemia: false result. The repeated K is 4.3 -follow up by BMP   DVT ppx: SQ Heparin  Code Status: Full code Family Communication: None at bed side.  Disposition Plan: Admit to inpatient   Date of Service 12/10/2014    [name] [facility] Pager [phone]  If 7PM-7AM, please contact night-coverage [email or web address] Password [email or web address] 12/10/2014, 9:05 AM ] \

## 2014-12-10 NOTE — ED Notes (Signed)
Pt able to communicate where she is now. Pt remembers husband told her she is at the hospital. Alert to self.

## 2014-12-10 NOTE — Discharge Summary (Signed)
AMA  Patient at this time expresses desire to leave the Hospital immidiately, patient has been warned that this is not Medically advisable at this time, and can result in Medical complications like Death and Disability, patient understands and accepts the risks involved and assumes full responsibilty of this decision.  Clearly advised her - Do not drive, operating heavy machinery, perform activities at heights, swimming or participation in water activities or provide baby sitting services until you have seen by Primary MD or a Neurologist and advised to do so again.   Question her to be compliant with her seizure medications and to follow with her neurologist within a week along with the neurosurgeon. She refuses to undergo any further testing or medication adjustment, she appears competent to make her decision. Again after much discussion, clear warning she is wanting to leave AMA.    [name] M.D on 12/10/2014 at 9:05 AM  [facility]    Last Note Below    [facility] History and Physical  Lori Ford ZPH:[id number] DOB: Mar 05, 1976 DOA: 12/10/2014  Referring physician: ED physician PCP: Lori Mccreedy, MD  Specialists:   Chief Complaint: Seizure, chest pain, shortness of breath, diarrhea, leg pain  HPI: Lori Ford is a 39 y.o. female with PMH of hypertension, GERD, depression, anxiety, seizure, asthma, brain surgery due to meningioma, tobacco abuse, HIV, who presents with a seizure, chest pain, diarrhea, leg pain.  Patient called EMS for shortness of breath tonight. Per triage note, pt had a Grand Mal seizure upon EMS arrival. She then had 2 other seizures en route. Nurse mentions that pt has had 3 more seizures while in the ED. Pt was given 5 mg total Midazolam by EMS. Patient is on Depakote and Keppra at  home. Per ED, husband does not believe that pt has missed any dosage of her seizure medications.   Patient also reports that she has shortness of breath, cough with yellow sputum production and chest pain, which have been going on for 3 days. She cannot describe the details of her chest pain since she is in postictal status. She states that she has been having diarrhea in the past 7 days. She has approximately 6-7 bowel movements with loose stools each day. She had nausea, vomiting and abdominal pain yesterday, which have resolved. He also reports that she had a fall and injured her left leg, knee and hip on 10/29/14. Currently she has pain over left lower leg and knee. Chart review showed that she had negative x-ray of left knee, right wrist and left hip on 10/29/14.  In ED, patient was found to have  VPA level low at 28, potassium of 5.9 without EKG changes, the repeated K is 4.3. WBC 7.4, no tachycardia, temperature normal, renal function okay. Patient is admitted to inpatient for further evaluation and treatment. Neurology was consulted.  Where does patient live?   At home  Can patient participate in ADLs?  Yes   Review of Systems:   General: no fevers, chills, no changes in body weight, has poor appetite, has fatigue HEENT: no blurry vision, hearing changes or sore throat Pulm: has dyspnea, coughing, no wheezing  CV: has chest pain, no palpitations Abd: has nausea, vomiting, abdominal pain, diarrhea, no constipation GU: no dysuria, burning on urination, increased urinary frequency, hematuria  Ext: no leg edema. Neuro: no unilateral weakness, numbness, or tingling, no vision change or hearing loss Skin: no rash MSK: No muscle spasm, no deformity, no limitation of range of movement in spin. Has left knee and lower leg pain Heme: No easy bruising.  Travel history: No recent long distant travel.  Allergy:  Allergies  Allergen Reactions  . Ibuprofen Shortness Of Breath    wheezing  .  Clindamycin/Lincomycin Hives  . Coconut Flavor Hives  . Flagyl [Metronidazole] Other (See Comments)    "it gave me a real bad bacterial infection"  . Latex Other (See Comments)    Rash, wheezing  . Other Swelling    "GRAPE SUBSTANCE"  . Aspirin Palpitations    wheezing    Past Medical History  Diagnosis Date  . 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 Seizures     due to head trauma as adult  . Asthma     as child  . Meningioma   . Gallstones     s/p cholecystectomy  . HTN (hypertension)   . GERD (gastroesophageal reflux disease)   . Allergic rhinitis   . Cigarette nicotine dependence with withdrawal   . DVT (deep venous thrombosis)   . Chronic back pain   . Sciatic pain   . Arthritis   . HIV (human immunodeficiency virus infection)     Past Surgical History  Procedure Laterality Date  . Brain surgery      2013 to remove a meningioma  . Cholecystectomy      Social History:  reports that she has been smoking Cigarettes.  She has a 4.5 pack-year smoking history. She has never used smokeless tobacco. She reports that she uses illicit drugs (Marijuana). She reports that she does not drink alcohol.  Family History:  Family History  Problem Relation Age of Onset  . Other Mother     varicose vein  . Asthma Mother   . High blood pressure Mother   . Deep vein thrombosis Neg Hx   . Pulmonary embolism Neg Hx   . Cancer    . Diabetes    . High blood pressure    . Asthma    . Thyroid disease    . Cancer Father      Prior to Admission medications   Medication Sig Start Date End Date Taking? Authorizing Provider  Abacavir-Dolutegravir-Lamivud (TRIUMEQ) 600-50-300 MG TABS Take 1 tablet by mouth daily. 06/23/14   Lori Hayward, MD  acetaminophen (TYLENOL) 500 MG tablet Take 1,000 mg by mouth every 6 (six) hours as needed for moderate pain or headache.    Historical Provider, MD  albuterol (PROVENTIL HFA;VENTOLIN HFA) 108 (90 BASE) MCG/ACT inhaler Inhale 2 puffs  into the lungs every 4 (four) hours as needed for wheezing or shortness of breath. 08/02/14   Britt Bottom, NP  albuterol (PROVENTIL) (2.5 MG/3ML) 0.083% nebulizer solution Take 2.5 mg by nebulization every 6 (six) hours as needed for wheezing or shortness of breath.    Historical Provider, MD  alprazolam Duanne Moron) 2 MG tablet Take 2 mg by mouth 3 (three) times daily as needed for sleep.    Historical Provider, MD  amphetamine-dextroamphetamine (ADDERALL XR) 30 MG 24 hr capsule Take 30 mg by mouth 2 (two) times daily.    Historical Provider, MD  beclomethasone (QVAR) 40 MCG/ACT inhaler Inhale 2 puffs into the lungs 2 (two) times daily as needed (for shortness of breath).     Historical Provider, MD  benzocaine (HURRICAINE) 20 % oral spray Apply 1 application topically 3 (three) times daily as needed for pain.    Historical Provider, MD  cyclobenzaprine (FLEXERIL) 10 MG tablet Take 1 tablet (10 mg total) by mouth 2 (two) times daily as needed for muscle spasms. 02/01/13   Larence Penning, MD  diazepam (VALIUM) 10 MG tablet Take 10 mg by mouth every 6 (six) hours as needed for anxiety.    Historical Provider, MD  divalproex (DEPAKOTE) 500 MG DR tablet Take 1 tablet (500 mg total) by mouth 3 (three) times daily. 07/09/13   Harvie Heck, PA-C  divalproex (DEPAKOTE) 500 MG DR tablet Take 1 tablet (500 mg total) by mouth 3 (three) times daily. Patient not taking: Reported on 08/30/2014 01/06/14   Noemi Chapel, MD  fluticasone [facility]) 50 MCG/ACT nasal spray Place 1 spray into the nose 2 (two) times daily.    Historical Provider, MD  folic acid (FOLVITE) 1 MG tablet Take 1 mg by mouth daily.    Historical Provider, MD  gabapentin (NEURONTIN) 400 MG capsule Take 800 mg by mouth 3 (three) times daily.     Historical Provider, MD  glucose blood (ACCU-CHEK AVIVA) test strip Use as instructed 12/07/14   Delos Haring, PA-C  hydrochlorothiazide (HYDRODIURIL) 25 MG tablet Take 1 tablet (25 mg total) by mouth daily.  07/09/13   Harvie Heck, PA-C  HYDROcodone-acetaminophen (NORCO/VICODIN) 5-325 MG per tablet Take 1 tablet by mouth every 4 (four) hours as needed. 08/06/14   Kristen N Ward, DO  HYDROcodone-acetaminophen (NORCO/VICODIN) 5-325 MG per tablet Take 1-2 tablets by mouth every 6 (six) hours as needed. 10/29/14   Dahlia Bailiff, PA-C  levETIRAcetam (KEPPRA) 1000 MG tablet Take 1 tablet (1,000 mg total) by mouth 2 (two) times daily. 07/09/13   Harvie Heck, PA-C  loratadine (CLARITIN) 10 MG tablet Take 10 mg by mouth daily.  03/03/14   Historical Provider, MD  Mefenamic Acid 250 MG CAPS Take 2 capsules (500 mg total) by mouth 3 (three) times daily. X 4 days 08/30/14   Mercedes Camprubi-Soms, PA-C  meloxicam (MOBIC) 15 MG tablet Take 1 tablet (15 mg total) by mouth daily. 12/07/14   Tiffany Carlota Raspberry, PA-C  methocarbamol (ROBAXIN) 500 MG tablet Take 1 tablet (500 mg total) by mouth 2 (two) times daily. 12/07/14   Tiffany Carlota Raspberry, PA-C  metroNIDAZOLE (METROGEL VAGINAL) 0.75 % vaginal gel Place 1 Applicatorful vaginally 2 (two) times daily. X 5 days 08/30/14   Mercedes Camprubi-Soms, PA-C  mirtazapine (REMERON) 15 MG tablet Take 20 mg by mouth at bedtime.     Historical Provider, MD  omeprazole (PRILOSEC) 20 MG capsule Take 20 mg by mouth 2 (two) times daily.    Historical Provider, MD  ondansetron (ZOFRAN ODT) 4 MG disintegrating tablet Take 1 tablet (4 mg total) by mouth every 8 (eight) hours as needed for nausea or vomiting. 08/06/14   Kristen N Ward, DO  oxyCODONE-acetaminophen (PERCOCET) 10-325 MG per tablet Take 1 tablet by mouth every 4 (four) hours as needed for pain.    Historical Provider, MD  PARoxetine (PAXIL) 40 MG tablet Take 1 tablet (40 mg total) by mouth daily. 07/09/13   Lauren Parker, PA-C  Phenylephrine-DM-GG-APAP (MUCINEX FAST-MAX CONGEST COLD) 5-10-200-325 MG TABS Take 2 tablets by mouth 3 (three) times daily. Patient not taking: Reported on 08/30/2014 08/02/14  Britt Bottom, NP  promethazine  (PHENERGAN) 6.25 MG/5ML syrup Take 12.5 mg by mouth every 6 (six) hours as needed for nausea or vomiting.    Historical Provider, MD  propranolol (INDERAL) 10 MG tablet Take 10 mg by mouth 2 (two) times daily.  01/28/14   Historical Provider, MD  sodium chloride (OCEAN) 0.65 % nasal spray Place 1 spray into the nose 2 (two) times daily as needed for congestion.     Historical Provider, MD  triamcinolone ointment (KENALOG) 0.5 % Apply 1 application topically 2 (two) times daily.    Historical Provider, MD    Physical Exam: Filed Vitals:   12/10/14 0515 12/10/14 0530 12/10/14 0545 12/10/14 0616  BP: 121/71 108/76 116/76 122/91  Pulse: 93 95 97 86  Temp:    98.6 F (37 C)  TempSrc:    Oral  Resp: 21 17 22 23   Height:    5\' 4"  (1.626 m)  Weight:    114 kg (251 lb 5.2 oz)  SpO2: 100% 100% 100% 100%   General: Not in acute distress HEENT:       Eyes: PERRL, EOMI, no scleral icterus.       ENT: No discharge from the ears and nose, no pharynx injection, no tonsillar enlargement.        Neck: No JVD, no bruit, no mass felt. Heme: No neck lymph node enlargement. Cardiac: S1/S2, RRR, No murmurs, No gallops or rubs. Pulm: No rales, wheezing, rhonchi or rubs. There is tenderness over mid chest wall. Abd: Soft, nondistended, nontender, no rebound pain, no organomegaly, BS present. Ext: No pitting leg edema bilaterally. 2+DP/PT pulse bilaterally. Musculoskeletal: No joint deformities, No joint redness or warmth, no limitation of ROM in spin. Has tenderness over left knee and left lower leg, without redness or swelling. Skin: No rashes.  Neuro: Postictal status, oriented X3, cranial nerves II-XII grossly intact, muscle strength 5/5 in all extremities, sensation to light touch intact. Brachial reflex 1+ bilaterally. Knee reflex 1+ bilaterally. Negative Babinski's sign.  Psych: Patient is not psychotic, no suicidal or hemocidal ideation.  Labs on Admission:  Basic Metabolic Panel:  Recent  Labs Lab 12/10/14 0214 12/10/14 0515  NA 134*  --   K 5.9* 4.3  CL 102  --   CO2 21*  --   GLUCOSE 88  --   BUN 10  --   CREATININE 0.92  --   CALCIUM 9.2  --    Liver Function Tests: No results for input(s): AST, ALT, ALKPHOS, BILITOT, PROT, ALBUMIN in the last 168 hours. No results for input(s): LIPASE, AMYLASE in the last 168 hours. No results for input(s): AMMONIA in the last 168 hours. CBC:  Recent Labs Lab 12/10/14 0214  WBC 7.4  NEUTROABS 4.3  HGB 13.7  HCT 40.2  MCV 95.3  PLT 201   Cardiac Enzymes: No results for input(s): CKTOTAL, CKMB, CKMBINDEX, TROPONINI in the last 168 hours.  BNP (last 3 results) No results for input(s): BNP in the last 8760 hours.  ProBNP (last 3 results) No results for input(s): PROBNP in the last 8760 hours.  CBG:  Recent Labs Lab 12/10/14 0309  GLUCAP 92    Radiological Exams on Admission: No results found.  EKG: Not done in ED, will get one.   Assessment/Plan Principal Problem:   Seizure disorder, grand mal Active Problems:   17mm Pulmonary nodule, right upper lobe.  repeat CT chest May 2015.   Asthma   Hypertension   GERD (gastroesophageal  reflux disease)   Cigarette nicotine dependence with withdrawal   Morbid obesity   HIV disease   Meningioma   Major depression, recurrent, chronic   Chest pain   Chest wall pain   Left leg pain   Left knee pain   Nausea vomiting and diarrhea   Seizures   Seizure   Hyperkalemia  Seizure disorder, grand mal: It is likely due to subtherapeutic level of anti-seizure medications. Neurology was consulted, Dr. Janann Colonel saw patient. Per Dr. Janann Colonel, recent head CT at [facility] which showed return of meningioma, but no imaging available.   -will admit SDU -Appreciate Dr. Hazle Quant consultation. Will follow-up recommendations as follows:  -MRI brain with and without  -EEG  -continue keppra 1000mg  BID  -increase VPA to 500-500-750  -continue gabapentin 800mg  TID  -seizure  precautions  Asthma: Patient has shortness of breath, but no wheezing or rhonchi on auscultations. Does not seem to have acute exacerbation. -Albuterol nebs prn -Pulmicort -Mucinex for cough  HTN:  -When necessary hydralazine -Hold HCTZ since patient is clinically dry -On propranolol  GERD: -switch PPI to pepcid until c diff pcr negative  Tobacco abuse and Alcohol abuse: -Did counseling about importance of quitting smoking -Nicotine patch  HIV disease: controled. CD4 830 and VL 1329 on 07/20/14 -continue home HIV meds: Triumeq  hx of Meningioma: s/p surgery.  Per Dr. Janann Colonel, recent head [facility] at [facility] which showed return of meningioma, but no imaging available. -f/u MRI -brain   Depression and anxiety:  no suicidal or homicidal ideations. -Continue home medications  Chest pain, SOB, cough: Etiology is not clear. pt also has chest wall pain. Patient cannot describe it in detail. Will rule out pneumonia and ACS -CXR. If trop and CXR negative, may consider to r/o PE -trop x 3 -prn NTG and morphine -no ASA since pt is allergic -check A1c and FLP -EKG  Left leg pain and Left knee pain: had negative x-ray of left knee on 10/29/14.  -prn percocet -X-ray of left tibia/fibula  Nausea vomiting and diarrhea: unclear etiology. Likely due to viral gastroenteritis, but needs to rule out other possibilities, such as C. difficile colitis. -IVF: NS 100cc/h -check c diff pcr  Hyperkalemia: false result. The repeated K is 4.3 -follow up by BMP   DVT ppx: SQ Heparin  Code Status: Full code Family Communication: None at bed side.  Disposition Plan: Admit to inpatient   Date of Service 12/10/2014    [name] [facility] Pager [phone]  If 7PM-7AM, please contact night-coverage [email or web address] Password [email or web address] 12/10/2014, 9:05 AM ] \

## 2014-12-10 NOTE — ED Notes (Signed)
0245 RR error.

## 2014-12-10 NOTE — ED Notes (Signed)
Pt cbg 92

## 2014-12-10 NOTE — ED Notes (Signed)
Per [facility] pt called 911 for reports of SOB and it hurt to swallow and breathe. Pt experienced a Grand Mal seizure  approx 1-1.5 min at 0100. EMS assisted resident to floor safely, pt did not hit head. Less than 5 min later pt had another seizure that last approx 30-45 sec while in EMS truck. Approx  5 more min later pt experienced a 3rd seizure with EMs. Total of 5mg  of midazolam given via EMS.   Hx of HTN, diabetes, Epilepsy, Asthma.  140/88 BP 102 HR 20 R 120 CBG

## 2014-12-10 NOTE — Progress Notes (Signed)
PT Cancellation Note  Patient Details Name: Lori Ford MRN: [id number] DOB: 09/29/1975   Cancelled Treatment:    Reason Eval/Treat Not Completed: Medical issues which prohibited therapy (pt currently on strict bedrest and await increased activity order)   Lanetta Inch Beth 12/10/2014, 7:07 AM Elwyn Reach, [place]

## 2014-12-10 NOTE — H&P (Signed)
[facility] History and Physical  Lori Ford XIP:[id number] DOB: May 09, 1975 DOA: 12/10/2014  Referring physician: ED physician PCP: Benito Mccreedy, MD  Specialists:   Chief Complaint: Seizure, chest pain, shortness of breath, diarrhea, leg pain  HPI: Lori Ford is a 39 y.o. female with PMH of hypertension, GERD, depression, anxiety, seizure, asthma, brain surgery due to meningioma, tobacco abuse, HIV, who presents with a seizure, chest pain, diarrhea, leg pain.  Patient called EMS for shortness of breath tonight. Per triage note, pt had a Grand Mal seizure upon EMS arrival. She then had 2 other seizures en route. Nurse mentions that pt has had 3 more seizures while in the ED. Pt was given 5 mg total Midazolam by EMS. Patient is on Depakote and Keppra at home. Per ED, husband does not believe that pt has missed any dosage of her seizure medications.   Patient also reports that she has shortness of breath, cough with yellow sputum production and chest pain, which have been going on for 3 days. She cannot describe the details of her chest pain since she is in postictal status. She states that she has been having diarrhea in the past 7 days. She has approximately 6-7 bowel movements with loose stools each day. She had nausea, vomiting and abdominal pain yesterday, which have resolved. He also reports that she had a fall and injured her left leg, knee and hip on 10/29/14. Currently she has pain over left lower leg and knee. Chart review showed that she had negative x-ray of left knee, right wrist and left hip on 10/29/14.  In ED, patient was found to have  VPA level low at 28, potassium of 5.9 without EKG changes, the repeated K is 4.3. WBC 7.4, no tachycardia, temperature normal, renal function okay. Patient is admitted to inpatient for further evaluation and treatment. Neurology was consulted.  Where does patient live?   At home  Can patient participate in ADLs?  Yes   Review of  Systems:   General: no fevers, chills, no changes in body weight, has poor appetite, has fatigue HEENT: no blurry vision, hearing changes or sore throat Pulm: has dyspnea, coughing, no wheezing CV: has chest pain, no palpitations Abd: has nausea, vomiting, abdominal pain, diarrhea, no constipation GU: no dysuria, burning on urination, increased urinary frequency, hematuria  Ext: no leg edema. Neuro: no unilateral weakness, numbness, or tingling, no vision change or hearing loss Skin: no rash MSK: No muscle spasm, no deformity, no limitation of range of movement in spin. Has left knee and lower leg pain Heme: No easy bruising.  Travel history: No recent long distant travel.  Allergy:  Allergies  Allergen Reactions  . Ibuprofen Shortness Of Breath    wheezing  . Clindamycin/Lincomycin Hives  . Coconut Flavor Hives  . Flagyl [Metronidazole] Other (See Comments)    "it gave me a real bad bacterial infection"  . Latex Other (See Comments)    Rash, wheezing  . Other Swelling    "GRAPE SUBSTANCE"  . Aspirin Palpitations    wheezing    Past Medical History  Diagnosis Date  . 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 Seizures     due to head trauma as adult  . Asthma     as child  . Meningioma   . Gallstones     s/p cholecystectomy  . HTN (hypertension)   . GERD (gastroesophageal reflux disease)   . Allergic rhinitis   . Cigarette nicotine dependence  with withdrawal   . DVT (deep venous thrombosis)   . Chronic back pain   . Sciatic pain   . Arthritis   . HIV (human immunodeficiency virus infection)     Past Surgical History  Procedure Laterality Date  . Brain surgery      2013 to remove a meningioma  . Cholecystectomy      Social History:  reports that she has been smoking Cigarettes.  She has a 4.5 pack-year smoking history. She has never used smokeless tobacco. She reports that she uses illicit drugs (Marijuana). She reports that she does not drink  alcohol.  Family History:  Family History  Problem Relation Age of Onset  . Other Mother     varicose vein  . Asthma Mother   . High blood pressure Mother   . Deep vein thrombosis Neg Hx   . Pulmonary embolism Neg Hx   . Cancer    . Diabetes    . High blood pressure    . Asthma    . Thyroid disease    . Cancer Father      Prior to Admission medications   Medication Sig Start Date End Date Taking? Authorizing Provider  Abacavir-Dolutegravir-Lamivud (TRIUMEQ) 600-50-300 MG TABS Take 1 tablet by mouth daily. 06/23/14   Truman Hayward, MD  acetaminophen (TYLENOL) 500 MG tablet Take 1,000 mg by mouth every 6 (six) hours as needed for moderate pain or headache.    Historical Provider, MD  albuterol (PROVENTIL HFA;VENTOLIN HFA) 108 (90 BASE) MCG/ACT inhaler Inhale 2 puffs into the lungs every 4 (four) hours as needed for wheezing or shortness of breath. 08/02/14   Britt Bottom, NP  albuterol (PROVENTIL) (2.5 MG/3ML) 0.083% nebulizer solution Take 2.5 mg by nebulization every 6 (six) hours as needed for wheezing or shortness of breath.    Historical Provider, MD  alprazolam Duanne Moron) 2 MG tablet Take 2 mg by mouth 3 (three) times daily as needed for sleep.    Historical Provider, MD  amphetamine-dextroamphetamine (ADDERALL XR) 30 MG 24 hr capsule Take 30 mg by mouth 2 (two) times daily.    Historical Provider, MD  beclomethasone (QVAR) 40 MCG/ACT inhaler Inhale 2 puffs into the lungs 2 (two) times daily as needed (for shortness of breath).     Historical Provider, MD  benzocaine (HURRICAINE) 20 % oral spray Apply 1 application topically 3 (three) times daily as needed for pain.    Historical Provider, MD  cyclobenzaprine (FLEXERIL) 10 MG tablet Take 1 tablet (10 mg total) by mouth 2 (two) times daily as needed for muscle spasms. 02/01/13   Larence Penning, MD  diazepam (VALIUM) 10 MG tablet Take 10 mg by mouth every 6 (six) hours as needed for anxiety.    Historical Provider, MD  divalproex  (DEPAKOTE) 500 MG DR tablet Take 1 tablet (500 mg total) by mouth 3 (three) times daily. 07/09/13   Harvie Heck, PA-C  divalproex (DEPAKOTE) 500 MG DR tablet Take 1 tablet (500 mg total) by mouth 3 (three) times daily. Patient not taking: Reported on 08/30/2014 01/06/14   Noemi Chapel, MD  fluticasone [facility]) 50 MCG/ACT nasal spray Place 1 spray into the nose 2 (two) times daily.    Historical Provider, MD  folic acid (FOLVITE) 1 MG tablet Take 1 mg by mouth daily.    Historical Provider, MD  gabapentin (NEURONTIN) 400 MG capsule Take 800 mg by mouth 3 (three) times daily.     Historical Provider, MD  glucose  blood (ACCU-CHEK AVIVA) test strip Use as instructed 12/07/14   Delos Haring, PA-C  hydrochlorothiazide (HYDRODIURIL) 25 MG tablet Take 1 tablet (25 mg total) by mouth daily. 07/09/13   Harvie Heck, PA-C  HYDROcodone-acetaminophen (NORCO/VICODIN) 5-325 MG per tablet Take 1 tablet by mouth every 4 (four) hours as needed. 08/06/14   Kristen N Ward, DO  HYDROcodone-acetaminophen (NORCO/VICODIN) 5-325 MG per tablet Take 1-2 tablets by mouth every 6 (six) hours as needed. 10/29/14   Dahlia Bailiff, PA-C  levETIRAcetam (KEPPRA) 1000 MG tablet Take 1 tablet (1,000 mg total) by mouth 2 (two) times daily. 07/09/13   Harvie Heck, PA-C  loratadine (CLARITIN) 10 MG tablet Take 10 mg by mouth daily.  03/03/14   Historical Provider, MD  Mefenamic Acid 250 MG CAPS Take 2 capsules (500 mg total) by mouth 3 (three) times daily. X 4 days 08/30/14   Mercedes Camprubi-Soms, PA-C  meloxicam (MOBIC) 15 MG tablet Take 1 tablet (15 mg total) by mouth daily. 12/07/14   Tiffany Carlota Raspberry, PA-C  methocarbamol (ROBAXIN) 500 MG tablet Take 1 tablet (500 mg total) by mouth 2 (two) times daily. 12/07/14   Tiffany Carlota Raspberry, PA-C  metroNIDAZOLE (METROGEL VAGINAL) 0.75 % vaginal gel Place 1 Applicatorful vaginally 2 (two) times daily. X 5 days 08/30/14   Mercedes Camprubi-Soms, PA-C  mirtazapine (REMERON) 15 MG tablet Take 20 mg by mouth at  bedtime.     Historical Provider, MD  omeprazole (PRILOSEC) 20 MG capsule Take 20 mg by mouth 2 (two) times daily.    Historical Provider, MD  ondansetron (ZOFRAN ODT) 4 MG disintegrating tablet Take 1 tablet (4 mg total) by mouth every 8 (eight) hours as needed for nausea or vomiting. 08/06/14   Kristen N Ward, DO  oxyCODONE-acetaminophen (PERCOCET) 10-325 MG per tablet Take 1 tablet by mouth every 4 (four) hours as needed for pain.    Historical Provider, MD  PARoxetine (PAXIL) 40 MG tablet Take 1 tablet (40 mg total) by mouth daily. 07/09/13   Lauren Parker, PA-C  Phenylephrine-DM-GG-APAP (MUCINEX FAST-MAX CONGEST COLD) 5-10-200-325 MG TABS Take 2 tablets by mouth 3 (three) times daily. Patient not taking: Reported on 08/30/2014 08/02/14   Britt Bottom, NP  promethazine (PHENERGAN) 6.25 MG/5ML syrup Take 12.5 mg by mouth every 6 (six) hours as needed for nausea or vomiting.    Historical Provider, MD  propranolol (INDERAL) 10 MG tablet Take 10 mg by mouth 2 (two) times daily.  01/28/14   Historical Provider, MD  sodium chloride (OCEAN) 0.65 % nasal spray Place 1 spray into the nose 2 (two) times daily as needed for congestion.     Historical Provider, MD  triamcinolone ointment (KENALOG) 0.5 % Apply 1 application topically 2 (two) times daily.    Historical Provider, MD    Physical Exam: Filed Vitals:   12/10/14 0500 12/10/14 0515 12/10/14 0530 12/10/14 0545  BP: 120/61 121/71 108/76 116/76  Pulse: 82 93 95 97  Temp:      TempSrc:      Resp: 21 21 17 22   Height:      Weight:      SpO2: 100% 100% 100% 100%   General: Not in acute distress HEENT:       Eyes: PERRL, EOMI, no scleral icterus.       ENT: No discharge from the ears and nose, no pharynx injection, no tonsillar enlargement.        Neck: No JVD, no bruit, no mass felt. Heme: No neck lymph node enlargement.  Cardiac: S1/S2, RRR, No murmurs, No gallops or rubs. Pulm: No rales, wheezing, rhonchi or rubs. There is tenderness  over mid chest wall. Abd: Soft, nondistended, nontender, no rebound pain, no organomegaly, BS present. Ext: No pitting leg edema bilaterally. 2+DP/PT pulse bilaterally. Musculoskeletal: No joint deformities, No joint redness or warmth, no limitation of ROM in spin. Has tenderness over left knee and left lower leg, without redness or swelling. Skin: No rashes.  Neuro: Postictal status, oriented X3, cranial nerves II-XII grossly intact, muscle strength 5/5 in all extremities, sensation to light touch intact. Brachial reflex 1+ bilaterally. Knee reflex 1+ bilaterally. Negative Babinski's sign.  Psych: Patient is not psychotic, no suicidal or hemocidal ideation.  Labs on Admission:  Basic Metabolic Panel:  Recent Labs Lab 12/10/14 0214 12/10/14 0515  NA 134*  --   K 5.9* 4.3  CL 102  --   CO2 21*  --   GLUCOSE 88  --   BUN 10  --   CREATININE 0.92  --   CALCIUM 9.2  --    Liver Function Tests: No results for input(s): AST, ALT, ALKPHOS, BILITOT, PROT, ALBUMIN in the last 168 hours. No results for input(s): LIPASE, AMYLASE in the last 168 hours. No results for input(s): AMMONIA in the last 168 hours. CBC:  Recent Labs Lab 12/10/14 0214  WBC 7.4  NEUTROABS 4.3  HGB 13.7  HCT 40.2  MCV 95.3  PLT 201   Cardiac Enzymes: No results for input(s): CKTOTAL, CKMB, CKMBINDEX, TROPONINI in the last 168 hours.  BNP (last 3 results) No results for input(s): BNP in the last 8760 hours.  ProBNP (last 3 results) No results for input(s): PROBNP in the last 8760 hours.  CBG:  Recent Labs Lab 12/10/14 0309  GLUCAP 92    Radiological Exams on Admission: No results found.  EKG: Not done in ED, will get one.   Assessment/Plan Principal Problem:   Seizure disorder, grand mal Active Problems:   65mm Pulmonary nodule, right upper lobe.  repeat CT chest May 2015.   Asthma   Hypertension   GERD (gastroesophageal reflux disease)   Cigarette nicotine dependence with withdrawal    Morbid obesity   HIV disease   Meningioma   Major depression, recurrent, chronic   Chest pain   Chest wall pain   Left leg pain   Left knee pain   Nausea vomiting and diarrhea   Seizures   Seizure   Hyperkalemia  Seizure disorder, grand mal: It is likely due to subtherapeutic level of anti-seizure medications. Neurology was consulted, Dr. Janann Colonel saw patient. Per Dr. Janann Colonel, recent head CT at [facility] which showed return of meningioma, but no imaging available.   -will admit SDU -Appreciate Dr. Hazle Quant consultation. Will follow-up recommendations as follows:  -MRI brain with and without  -EEG  -continue keppra 1000mg  BID  -increase VPA to 500-500-750  -continue gabapentin 800mg  TID  -seizure precautions  Asthma: Patient has shortness of breath, but no wheezing or rhonchi on auscultations. Does not seem to have acute exacerbation. -Albuterol nebs prn -Pulmicort -Mucinex for cough  HTN:  -When necessary hydralazine -Hold HCTZ since patient is clinically dry -On propranolol  GERD: -switch PPI to pepcid until c diff pcr negative  Tobacco abuse and Alcohol abuse: -Did counseling about importance of quitting smoking -Nicotine patch  HIV disease: controled. CD4 830 and VL 1329 on 07/20/14 -continue home HIV meds: Triumeq  hx of Meningioma: s/p surgery.  Per Dr. Janann Colonel, recent head CT  at [facility] which showed return of meningioma, but no imaging available. -f/u MRI -brain   Depression and anxiety:  no suicidal or homicidal ideations. -Continue home medications  Chest pain, SOB, cough: Etiology is not clear. pt also has chest wall pain. Patient cannot describe it in detail. Will rule out pneumonia and ACS -CXR. If trop and CXR negative, may consider to r/o PE -trop x 3 -prn NTG and morphine -no ASA since pt is allergic -check A1c and FLP -EKG  Left leg pain and Left knee pain: had negative x-ray of left knee on 10/29/14.  -prn percocet -X-ray of left  tibia/fibula  Nausea vomiting and diarrhea: unclear etiology. Likely due to viral gastroenteritis, but needs to rule out other possibilities, such as C. difficile colitis. -IVF: NS 100cc/h -check c diff pcr  Hyperkalemia: false result. The repeated K is 4.3 -follow up by BMP   DVT ppx: SQ Heparin  Code Status: Full code Family Communication: None at bed side.  Disposition Plan: Admit to inpatient   Date of Service 12/10/2014    Ivor Costa [facility] Pager [phone]  If 7PM-7AM, please contact night-coverage [email or web address] Password [facility] 12/10/2014, 6:35 AM ] \
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
## 2014-12-10 NOTE — H&P (Signed)
AMA  Patient at this time expresses desire to leave the Hospital immidiately, patient has been warned that this is not Medically advisable at this time, and can result in Medical complications like Death and Disability, patient understands and accepts the risks involved and assumes full responsibilty of this decision.  Clearly advised her - Do not drive, operating heavy machinery, perform activities at heights, swimming or participation in water activities or provide baby sitting services until you have seen by Primary MD or a Neurologist and advised to do so again.   Question her to be compliant with her seizure medications and to follow with her neurologist within a week along with the neurosurgeon. She refuses to undergo any further testing or medication adjustment, she appears competent to make her decision. Again after much discussion, clear warning she is wanting to leave AMA.    [name] M.D on 12/10/2014 at 9:03 AM  [facility]    Last Note Below    [facility] History and Physical  Lori Ford AYT:[id number] DOB: 1975-05-01 DOA: 12/10/2014  Referring physician: ED physician PCP: Benito Mccreedy, MD  Specialists:   Chief Complaint: Seizure, chest pain, shortness of breath, diarrhea, leg pain  HPI: Lori Ford is a 39 y.o. female with PMH of hypertension, GERD, depression, anxiety, seizure, asthma, brain surgery due to meningioma, tobacco abuse, HIV, who presents with a seizure, chest pain, diarrhea, leg pain.  Patient called EMS for shortness of breath tonight. Per triage note, pt had a Grand Mal seizure upon EMS arrival. She then had 2 other seizures en route. Nurse mentions that pt has had 3 more seizures while in the ED. Pt was given 5 mg total Midazolam by EMS. Patient is on Depakote and Keppra at  home. Per ED, husband does not believe that pt has missed any dosage of her seizure medications.   Patient also reports that she has shortness of breath, cough with yellow sputum production and chest pain, which have been going on for 3 days. She cannot describe the details of her chest pain since she is in postictal status. She states that she has been having diarrhea in the past 7 days. She has approximately 6-7 bowel movements with loose stools each day. She had nausea, vomiting and abdominal pain yesterday, which have resolved. He also reports that she had a fall and injured her left leg, knee and hip on 10/29/14. Currently she has pain over left lower leg and knee. Chart review showed that she had negative x-ray of left knee, right wrist and left hip on 10/29/14.  In ED, patient was found to have  VPA level low at 28, potassium of 5.9 without EKG changes, the repeated K is 4.3. WBC 7.4, no tachycardia, temperature normal, renal function okay. Patient is admitted to inpatient for further evaluation and treatment. Neurology was consulted.  Where does patient live?   At home  Can patient participate in ADLs?  Yes   Review of Systems:   General: no fevers, chills, no changes in body weight, has poor appetite, has fatigue HEENT: no blurry vision, hearing changes or sore throat Pulm: has dyspnea, coughing, no wheezing  CV: has chest pain, no palpitations Abd: has nausea, vomiting, abdominal pain, diarrhea, no constipation GU: no dysuria, burning on urination, increased urinary frequency, hematuria  Ext: no leg edema. Neuro: no unilateral weakness, numbness, or tingling, no vision change or hearing loss Skin: no rash MSK: No muscle spasm, no deformity, no limitation of range of movement in spin. Has left knee and lower leg pain Heme: No easy bruising.  Travel history: No recent long distant travel.  Allergy:  Allergies  Allergen Reactions  . Ibuprofen Shortness Of Breath    wheezing  .  Clindamycin/Lincomycin Hives  . Coconut Flavor Hives  . Flagyl [Metronidazole] Other (See Comments)    "it gave me a real bad bacterial infection"  . Latex Other (See Comments)    Rash, wheezing  . Other Swelling    "GRAPE SUBSTANCE"  . Aspirin Palpitations    wheezing    Past Medical History  Diagnosis Date  . 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 Seizures     due to head trauma as adult  . Asthma     as child  . Meningioma   . Gallstones     s/p cholecystectomy  . HTN (hypertension)   . GERD (gastroesophageal reflux disease)   . Allergic rhinitis   . Cigarette nicotine dependence with withdrawal   . DVT (deep venous thrombosis)   . Chronic back pain   . Sciatic pain   . Arthritis   . HIV (human immunodeficiency virus infection)     Past Surgical History  Procedure Laterality Date  . Brain surgery      2013 to remove a meningioma  . Cholecystectomy      Social History:  reports that she has been smoking Cigarettes.  She has a 4.5 pack-year smoking history. She has never used smokeless tobacco. She reports that she uses illicit drugs (Marijuana). She reports that she does not drink alcohol.  Family History:  Family History  Problem Relation Age of Onset  . Other Mother     varicose vein  . Asthma Mother   . High blood pressure Mother   . Deep vein thrombosis Neg Hx   . Pulmonary embolism Neg Hx   . Cancer    . Diabetes    . High blood pressure    . Asthma    . Thyroid disease    . Cancer Father      Prior to Admission medications   Medication Sig Start Date End Date Taking? Authorizing Provider  Abacavir-Dolutegravir-Lamivud (TRIUMEQ) 600-50-300 MG TABS Take 1 tablet by mouth daily. 06/23/14   Truman Hayward, MD  acetaminophen (TYLENOL) 500 MG tablet Take 1,000 mg by mouth every 6 (six) hours as needed for moderate pain or headache.    Historical Provider, MD  albuterol (PROVENTIL HFA;VENTOLIN HFA) 108 (90 BASE) MCG/ACT inhaler Inhale 2 puffs  into the lungs every 4 (four) hours as needed for wheezing or shortness of breath. 08/02/14   Britt Bottom, NP  albuterol (PROVENTIL) (2.5 MG/3ML) 0.083% nebulizer solution Take 2.5 mg by nebulization every 6 (six) hours as needed for wheezing or shortness of breath.    Historical Provider, MD  alprazolam Duanne Moron) 2 MG tablet Take 2 mg by mouth 3 (three) times daily as needed for sleep.    Historical Provider, MD  amphetamine-dextroamphetamine (ADDERALL XR) 30 MG 24 hr capsule Take 30 mg by mouth 2 (two) times daily.    Historical Provider, MD  beclomethasone (QVAR) 40 MCG/ACT inhaler Inhale 2 puffs into the lungs 2 (two) times daily as needed (for shortness of breath).     Historical Provider, MD  benzocaine (HURRICAINE) 20 % oral spray Apply 1 application topically 3 (three) times daily as needed for pain.    Historical Provider, MD  cyclobenzaprine (FLEXERIL) 10 MG tablet Take 1 tablet (10 mg total) by mouth 2 (two) times daily as needed for muscle spasms. 02/01/13   Larence Penning, MD  diazepam (VALIUM) 10 MG tablet Take 10 mg by mouth every 6 (six) hours as needed for anxiety.    Historical Provider, MD  divalproex (DEPAKOTE) 500 MG DR tablet Take 1 tablet (500 mg total) by mouth 3 (three) times daily. 07/09/13   Harvie Heck, PA-C  divalproex (DEPAKOTE) 500 MG DR tablet Take 1 tablet (500 mg total) by mouth 3 (three) times daily. Patient not taking: Reported on 08/30/2014 01/06/14   Noemi Chapel, MD  fluticasone [facility]) 50 MCG/ACT nasal spray Place 1 spray into the nose 2 (two) times daily.    Historical Provider, MD  folic acid (FOLVITE) 1 MG tablet Take 1 mg by mouth daily.    Historical Provider, MD  gabapentin (NEURONTIN) 400 MG capsule Take 800 mg by mouth 3 (three) times daily.     Historical Provider, MD  glucose blood (ACCU-CHEK AVIVA) test strip Use as instructed 12/07/14   Delos Haring, PA-C  hydrochlorothiazide (HYDRODIURIL) 25 MG tablet Take 1 tablet (25 mg total) by mouth daily.  07/09/13   Harvie Heck, PA-C  HYDROcodone-acetaminophen (NORCO/VICODIN) 5-325 MG per tablet Take 1 tablet by mouth every 4 (four) hours as needed. 08/06/14   Kristen N Ward, DO  HYDROcodone-acetaminophen (NORCO/VICODIN) 5-325 MG per tablet Take 1-2 tablets by mouth every 6 (six) hours as needed. 10/29/14   Dahlia Bailiff, PA-C  levETIRAcetam (KEPPRA) 1000 MG tablet Take 1 tablet (1,000 mg total) by mouth 2 (two) times daily. 07/09/13   Harvie Heck, PA-C  loratadine (CLARITIN) 10 MG tablet Take 10 mg by mouth daily.  03/03/14   Historical Provider, MD  Mefenamic Acid 250 MG CAPS Take 2 capsules (500 mg total) by mouth 3 (three) times daily. X 4 days 08/30/14   Mercedes Camprubi-Soms, PA-C  meloxicam (MOBIC) 15 MG tablet Take 1 tablet (15 mg total) by mouth daily. 12/07/14   Tiffany Carlota Raspberry, PA-C  methocarbamol (ROBAXIN) 500 MG tablet Take 1 tablet (500 mg total) by mouth 2 (two) times daily. 12/07/14   Tiffany Carlota Raspberry, PA-C  metroNIDAZOLE (METROGEL VAGINAL) 0.75 % vaginal gel Place 1 Applicatorful vaginally 2 (two) times daily. X 5 days 08/30/14   Mercedes Camprubi-Soms, PA-C  mirtazapine (REMERON) 15 MG tablet Take 20 mg by mouth at bedtime.     Historical Provider, MD  omeprazole (PRILOSEC) 20 MG capsule Take 20 mg by mouth 2 (two) times daily.    Historical Provider, MD  ondansetron (ZOFRAN ODT) 4 MG disintegrating tablet Take 1 tablet (4 mg total) by mouth every 8 (eight) hours as needed for nausea or vomiting. 08/06/14   Kristen N Ward, DO  oxyCODONE-acetaminophen (PERCOCET) 10-325 MG per tablet Take 1 tablet by mouth every 4 (four) hours as needed for pain.    Historical Provider, MD  PARoxetine (PAXIL) 40 MG tablet Take 1 tablet (40 mg total) by mouth daily. 07/09/13   Lauren Parker, PA-C  Phenylephrine-DM-GG-APAP (MUCINEX FAST-MAX CONGEST COLD) 5-10-200-325 MG TABS Take 2 tablets by mouth 3 (three) times daily. Patient not taking: Reported on 08/30/2014 08/02/14  Britt Bottom, NP  promethazine  (PHENERGAN) 6.25 MG/5ML syrup Take 12.5 mg by mouth every 6 (six) hours as needed for nausea or vomiting.    Historical Provider, MD  propranolol (INDERAL) 10 MG tablet Take 10 mg by mouth 2 (two) times daily.  01/28/14   Historical Provider, MD  sodium chloride (OCEAN) 0.65 % nasal spray Place 1 spray into the nose 2 (two) times daily as needed for congestion.     Historical Provider, MD  triamcinolone ointment (KENALOG) 0.5 % Apply 1 application topically 2 (two) times daily.    Historical Provider, MD    Physical Exam: Filed Vitals:   12/10/14 0515 12/10/14 0530 12/10/14 0545 12/10/14 0616  BP: 121/71 108/76 116/76 122/91  Pulse: 93 95 97 86  Temp:    98.6 F (37 C)  TempSrc:    Oral  Resp: 21 17 22 23   Height:    5\' 4"  (1.626 m)  Weight:    114 kg (251 lb 5.2 oz)  SpO2: 100% 100% 100% 100%   General: Not in acute distress HEENT:       Eyes: PERRL, EOMI, no scleral icterus.       ENT: No discharge from the ears and nose, no pharynx injection, no tonsillar enlargement.        Neck: No JVD, no bruit, no mass felt. Heme: No neck lymph node enlargement. Cardiac: S1/S2, RRR, No murmurs, No gallops or rubs. Pulm: No rales, wheezing, rhonchi or rubs. There is tenderness over mid chest wall. Abd: Soft, nondistended, nontender, no rebound pain, no organomegaly, BS present. Ext: No pitting leg edema bilaterally. 2+DP/PT pulse bilaterally. Musculoskeletal: No joint deformities, No joint redness or warmth, no limitation of ROM in spin. Has tenderness over left knee and left lower leg, without redness or swelling. Skin: No rashes.  Neuro: Postictal status, oriented X3, cranial nerves II-XII grossly intact, muscle strength 5/5 in all extremities, sensation to light touch intact. Brachial reflex 1+ bilaterally. Knee reflex 1+ bilaterally. Negative Babinski's sign.  Psych: Patient is not psychotic, no suicidal or hemocidal ideation.  Labs on Admission:  Basic Metabolic Panel:  Recent  Labs Lab 12/10/14 0214 12/10/14 0515  NA 134*  --   K 5.9* 4.3  CL 102  --   CO2 21*  --   GLUCOSE 88  --   BUN 10  --   CREATININE 0.92  --   CALCIUM 9.2  --    Liver Function Tests: No results for input(s): AST, ALT, ALKPHOS, BILITOT, PROT, ALBUMIN in the last 168 hours. No results for input(s): LIPASE, AMYLASE in the last 168 hours. No results for input(s): AMMONIA in the last 168 hours. CBC:  Recent Labs Lab 12/10/14 0214  WBC 7.4  NEUTROABS 4.3  HGB 13.7  HCT 40.2  MCV 95.3  PLT 201   Cardiac Enzymes: No results for input(s): CKTOTAL, CKMB, CKMBINDEX, TROPONINI in the last 168 hours.  BNP (last 3 results) No results for input(s): BNP in the last 8760 hours.  ProBNP (last 3 results) No results for input(s): PROBNP in the last 8760 hours.  CBG:  Recent Labs Lab 12/10/14 0309  GLUCAP 92    Radiological Exams on Admission: No results found.  EKG: Not done in ED, will get one.   Assessment/Plan Principal Problem:   Seizure disorder, grand mal Active Problems:   36mm Pulmonary nodule, right upper lobe.  repeat CT chest May 2015.   Asthma   Hypertension   GERD (gastroesophageal  reflux disease)   Cigarette nicotine dependence with withdrawal   Morbid obesity   HIV disease   Meningioma   Major depression, recurrent, chronic   Chest pain   Chest wall pain   Left leg pain   Left knee pain   Nausea vomiting and diarrhea   Seizures   Seizure   Hyperkalemia  Seizure disorder, grand mal: It is likely due to subtherapeutic level of anti-seizure medications. Neurology was consulted, Dr. Janann Colonel saw patient. Per Dr. Janann Colonel, recent head [facility] at [facility] which showed return of meningioma, but no imaging available.   -will admit SDU -Appreciate Dr. Hazle Quant consultation. Will follow-up recommendations as follows:  -MRI brain with and without  -EEG  -continue keppra 1000mg  BID  -increase VPA to 500-500-750  -continue gabapentin 800mg  TID  -seizure  precautions  Asthma: Patient has shortness of breath, but no wheezing or rhonchi on auscultations. Does not seem to have acute exacerbation. -Albuterol nebs prn -Pulmicort -Mucinex for cough  HTN:  -When necessary hydralazine -Hold HCTZ since patient is clinically dry -On propranolol  GERD: -switch PPI to pepcid until c diff pcr negative  Tobacco abuse and Alcohol abuse: -Did counseling about importance of quitting smoking -Nicotine patch  HIV disease: controled. CD4 830 and VL 1329 on 07/20/14 -continue home HIV meds: Triumeq  hx of Meningioma: s/p surgery.  Per Dr. Janann Colonel, recent head CT at [facility] which showed return of meningioma, but no imaging available. -f/u MRI -brain   Depression and anxiety:  no suicidal or homicidal ideations. -Continue home medications  Chest pain, SOB, cough: Etiology is not clear. pt also has chest wall pain. Patient cannot describe it in detail. Will rule out pneumonia and ACS -CXR. If trop and CXR negative, may consider to r/o PE -trop x 3 -prn NTG and morphine -no ASA since pt is allergic -check A1c and FLP -EKG  Left leg pain and Left knee pain: had negative x-ray of left knee on 10/29/14.  -prn percocet -X-ray of left tibia/fibula  Nausea vomiting and diarrhea: unclear etiology. Likely due to viral gastroenteritis, but needs to rule out other possibilities, such as C. difficile colitis. -IVF: NS 100cc/h -check c diff pcr  Hyperkalemia: false result. The repeated K is 4.3 -follow up by BMP   DVT ppx: SQ Heparin  Code Status: Full code Family Communication: None at bed side.  Disposition Plan: Admit to inpatient   Date of Service 12/10/2014    [name] [facility] Pager [phone]  If 7PM-7AM, please contact night-coverage [email or web address] Password [facility] 12/10/2014, 9:03 AM ] \

## 2014-12-10 NOTE — Progress Notes (Signed)
Pt demanding to leave AMA. Pt was seen by Dr. Candiss Norse and he agreed for her to leave.  RN had pt sign papers, legality was explained, IV was dc'd, and pt was given paper scrubs.

## 2014-12-10 NOTE — ED Provider Notes (Signed)
CSN: [id number]     Arrival date & time 12/10/14  0128 History   This chart was scribed for Delora Fuel, MD by Eustaquio Maize, ED Scribe. This patient was seen in room D34C/D34C and the patient's care was started at 1:56 AM.  Chief Complaint  Patient presents with  . Shortness of Breath  . Seizures   LEVEL 5 CAVEAT for altered mental status  The history is provided by the spouse. No language interpreter was used.     HPI Comments: Lori Ford is a 39 y.o. female brought in by ambulance, who presents to the Emergency Department complaining of shortness of breath that occurred earlier tonight. Pt called EMS for shortness of breath and difficulty swallowing. Per triage note, pt had a Grand Mal seizure upon EMS arrival. She then had 2 other seizures en route. Nurse mentions that pt has had 3 more seizures while in the ED. Pt was given 5 mg total Midazolam by EMS. Husband cannot say how often patient usually has seizures due to it being intermittent. Her medications were left at home so husband cannot say what kind of medications she is on but believes one of her seizure medications may be Depakote. Husband does not believe that pt has missed any dosage of her seizure medications.    Past Medical History  Diagnosis Date  . 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 Seizures     due to head trauma as adult  . Asthma     as child  . Meningioma   . Gallstones     s/p cholecystectomy  . HTN (hypertension)   . GERD (gastroesophageal reflux disease)   . Allergic rhinitis   . Cigarette nicotine dependence with withdrawal   . DVT (deep venous thrombosis)   . Chronic back pain   . Sciatic pain   . Arthritis   . HIV (human immunodeficiency virus infection)    Past Surgical History  Procedure Laterality Date  . Brain surgery      2013 to remove a meningioma  . Cholecystectomy     Family History  Problem Relation Age of Onset  . Other Mother     varicose vein  . Asthma Mother   .  High blood pressure Mother   . Deep vein thrombosis Neg Hx   . Pulmonary embolism Neg Hx   . Cancer    . Diabetes    . High blood pressure    . Asthma    . Thyroid disease    . Cancer Father    Social History  Substance Use Topics  . Smoking status: Current Every Day Smoker -- 0.25 packs/day for 18 years    Types: Cigarettes  . Smokeless tobacco: Never Used  . Alcohol Use: No   OB History    Gravida Para Term Preterm AB TAB SAB Ectopic Multiple Living   3 1 1  2 2    1      Review of Systems  Unable to perform ROS: Mental status change  Neurological: Positive for seizures.   Allergies  Ibuprofen; Clindamycin/lincomycin; Coconut flavor; Flagyl; Latex; Other; and Aspirin  Home Medications   Prior to Admission medications   Medication Sig Start Date End Date Taking? Authorizing Provider  Abacavir-Dolutegravir-Lamivud (TRIUMEQ) 600-50-300 MG TABS Take 1 tablet by mouth daily. 06/23/14   Truman Hayward, MD  acetaminophen (TYLENOL) 500 MG tablet Take 1,000 mg by mouth every 6 (six) hours as needed for moderate  pain or headache.    Historical Provider, MD  albuterol (PROVENTIL HFA;VENTOLIN HFA) 108 (90 BASE) MCG/ACT inhaler Inhale 2 puffs into the lungs every 4 (four) hours as needed for wheezing or shortness of breath. 08/02/14   Britt Bottom, NP  albuterol (PROVENTIL) (2.5 MG/3ML) 0.083% nebulizer solution Take 2.5 mg by nebulization every 6 (six) hours as needed for wheezing or shortness of breath.    Historical Provider, MD  alprazolam Duanne Moron) 2 MG tablet Take 2 mg by mouth 3 (three) times daily as needed for sleep.    Historical Provider, MD  amphetamine-dextroamphetamine (ADDERALL XR) 30 MG 24 hr capsule Take 30 mg by mouth 2 (two) times daily.    Historical Provider, MD  beclomethasone (QVAR) 40 MCG/ACT inhaler Inhale 2 puffs into the lungs 2 (two) times daily as needed (for shortness of breath).     Historical Provider, MD  benzocaine (HURRICAINE) 20 % oral spray  Apply 1 application topically 3 (three) times daily as needed for pain.    Historical Provider, MD  cyclobenzaprine (FLEXERIL) 10 MG tablet Take 1 tablet (10 mg total) by mouth 2 (two) times daily as needed for muscle spasms. 02/01/13   Larence Penning, MD  diazepam (VALIUM) 10 MG tablet Take 10 mg by mouth every 6 (six) hours as needed for anxiety.    Historical Provider, MD  divalproex (DEPAKOTE) 500 MG DR tablet Take 1 tablet (500 mg total) by mouth 3 (three) times daily. 07/09/13   Harvie Heck, PA-C  divalproex (DEPAKOTE) 500 MG DR tablet Take 1 tablet (500 mg total) by mouth 3 (three) times daily. Patient not taking: Reported on 08/30/2014 01/06/14   Noemi Chapel, MD  fluticasone [facility]) 50 MCG/ACT nasal spray Place 1 spray into the nose 2 (two) times daily.    Historical Provider, MD  folic acid (FOLVITE) 1 MG tablet Take 1 mg by mouth daily.    Historical Provider, MD  gabapentin (NEURONTIN) 400 MG capsule Take 800 mg by mouth 3 (three) times daily.     Historical Provider, MD  glucose blood (ACCU-CHEK AVIVA) test strip Use as instructed 12/07/14   Delos Haring, PA-C  hydrochlorothiazide (HYDRODIURIL) 25 MG tablet Take 1 tablet (25 mg total) by mouth daily. 07/09/13   Harvie Heck, PA-C  HYDROcodone-acetaminophen (NORCO/VICODIN) 5-325 MG per tablet Take 1 tablet by mouth every 4 (four) hours as needed. 08/06/14   Kristen N Ward, DO  HYDROcodone-acetaminophen (NORCO/VICODIN) 5-325 MG per tablet Take 1-2 tablets by mouth every 6 (six) hours as needed. 10/29/14   Dahlia Bailiff, PA-C  levETIRAcetam (KEPPRA) 1000 MG tablet Take 1 tablet (1,000 mg total) by mouth 2 (two) times daily. 07/09/13   Harvie Heck, PA-C  loratadine (CLARITIN) 10 MG tablet Take 10 mg by mouth daily.  03/03/14   Historical Provider, MD  Mefenamic Acid 250 MG CAPS Take 2 capsules (500 mg total) by mouth 3 (three) times daily. X 4 days 08/30/14   Mercedes Camprubi-Soms, PA-C  meloxicam (MOBIC) 15 MG tablet Take 1 tablet (15 mg total)  by mouth daily. 12/07/14   Tiffany Carlota Raspberry, PA-C  methocarbamol (ROBAXIN) 500 MG tablet Take 1 tablet (500 mg total) by mouth 2 (two) times daily. 12/07/14   Tiffany Carlota Raspberry, PA-C  metroNIDAZOLE (METROGEL VAGINAL) 0.75 % vaginal gel Place 1 Applicatorful vaginally 2 (two) times daily. X 5 days 08/30/14   Mercedes Camprubi-Soms, PA-C  mirtazapine (REMERON) 15 MG tablet Take 20 mg by mouth at bedtime.     Historical Provider, MD  omeprazole (PRILOSEC) 20 MG capsule Take 20 mg by mouth 2 (two) times daily.    Historical Provider, MD  ondansetron (ZOFRAN ODT) 4 MG disintegrating tablet Take 1 tablet (4 mg total) by mouth every 8 (eight) hours as needed for nausea or vomiting. 08/06/14   Kristen N Ward, DO  oxyCODONE-acetaminophen (PERCOCET) 10-325 MG per tablet Take 1 tablet by mouth every 4 (four) hours as needed for pain.    Historical Provider, MD  PARoxetine (PAXIL) 40 MG tablet Take 1 tablet (40 mg total) by mouth daily. 07/09/13   Lauren Parker, PA-C  Phenylephrine-DM-GG-APAP (MUCINEX FAST-MAX CONGEST COLD) 5-10-200-325 MG TABS Take 2 tablets by mouth 3 (three) times daily. Patient not taking: Reported on 08/30/2014 08/02/14   Britt Bottom, NP  promethazine (PHENERGAN) 6.25 MG/5ML syrup Take 12.5 mg by mouth every 6 (six) hours as needed for nausea or vomiting.    Historical Provider, MD  propranolol (INDERAL) 10 MG tablet Take 10 mg by mouth 2 (two) times daily.  01/28/14   Historical Provider, MD  sodium chloride (OCEAN) 0.65 % nasal spray Place 1 spray into the nose 2 (two) times daily as needed for congestion.     Historical Provider, MD  triamcinolone ointment (KENALOG) 0.5 % Apply 1 application topically 2 (two) times daily.    Historical Provider, MD   Triage Vitals: Temp(Src) 98.4 F (36.9 C) (Oral)  Ht 5\' 5"  (1.651 m)  Wt 250 lb (113.399 kg)  BMI 41.60 kg/m2  SpO2 100%  LMP 11/16/2014   Physical Exam  Constitutional: She appears well-developed and well-nourished. No distress.  HENT:   Head: Normocephalic and atraumatic.  Eyes: EOM are normal. Pupils are equal, round, and reactive to light.  Neck: Normal range of motion. Neck supple. No JVD present.  Cardiovascular: Normal rate, regular rhythm and normal heart sounds.   No murmur heard. Pulmonary/Chest: Effort normal and breath sounds normal. She has no wheezes. She has no rales. She exhibits no tenderness.  Abdominal: Soft. Bowel sounds are normal. She exhibits no distension and no mass. There is no tenderness.  Musculoskeletal: Normal range of motion. She exhibits edema. She exhibits no tenderness.  1+ pitting edema  Lymphadenopathy:    She has no cervical adenopathy.  Neurological: No cranial nerve deficit.  Not responsive to voice or pain Tremor noted right upper extremity  Skin: Skin is warm and dry. No rash noted.  Nursing note and vitals reviewed.   ED Course  Procedures (including critical care time)  DIAGNOSTIC STUDIES: Oxygen Saturation is 100% on RA, normal by my interpretation.    COORDINATION OF CARE: 2:02 AM-Discussed treatment plan which includes BMP, CBC, Lactic acid, Valproic acid level, CBG with pt at bedside and pt agreed to plan.   Labs Review Results for orders placed or performed during the hospital encounter of 70/26/37  Basic metabolic panel  Result Value Ref Range   Sodium 134 (L) 135 - 145 mmol/L   Potassium 5.9 (H) 3.5 - 5.1 mmol/L   Chloride 102 101 - 111 mmol/L   CO2 21 (L) 22 - 32 mmol/L   Glucose, Bld 88 65 - 99 mg/dL   BUN 10 6 - 20 mg/dL   Creatinine, Ser 0.92 0.44 - 1.00 mg/dL   Calcium 9.2 8.9 - 10.3 mg/dL   GFR calc non Af Amer >60 >60 mL/min   GFR calc Af Amer >60 >60 mL/min   Anion gap 11 5 - 15  CBC with Differential  Result Value Ref Range  WBC 7.4 4.0 - 10.5 K/uL   RBC 4.22 3.87 - 5.11 MIL/uL   Hemoglobin 13.7 12.0 - 15.0 g/dL   HCT 40.2 36.0 - 46.0 %   MCV 95.3 78.0 - 100.0 fL   MCH 32.5 26.0 - 34.0 pg   MCHC 34.1 30.0 - 36.0 g/dL   RDW 14.2 11.5 - 15.5  %   Platelets 201 150 - 400 K/uL   Neutrophils Relative % 59 43 - 77 %   Lymphocytes Relative 34 12 - 46 %   Monocytes Relative 5 3 - 12 %   Eosinophils Relative 1 0 - 5 %   Basophils Relative 1 0 - 1 %   Neutro Abs 4.3 1.7 - 7.7 K/uL   Lymphs Abs 2.5 0.7 - 4.0 K/uL   Monocytes Absolute 0.4 0.1 - 1.0 K/uL   Eosinophils Absolute 0.1 0.0 - 0.7 K/uL   Basophils Absolute 0.1 0.0 - 0.1 K/uL   Smear Review MORPHOLOGY UNREMARKABLE   Valproic acid level  Result Value Ref Range   Valproic Acid Lvl 28 (L) 50.0 - 100.0 ug/mL  Potassium  Result Value Ref Range   Potassium 4.3 3.5 - 5.1 mmol/L  CBG monitoring, ED  Result Value Ref Range   Glucose-Capillary 92 65 - 99 mg/dL   I have personally reviewed and evaluated these images and lab results as part of my medical decision-making.   MDM   Final diagnoses:  Seizures    Patient who reportedly had called for an ambulance for dyspnea but had seizures in the ambulance requiring midazolam. After arrival in the ED, she had 2 additional seizures and stayed in what appeared to be a post ictal state. Review her medication list shows that she is supposed to be on levetiracetam and valproic acid. Blood level of valproic acid was low and she was given a dose of intravenous valproic acid. She is also given a dose of intravenous levetiracetam. Because she had multiple seizures, it was felt that she should be admitted for neurologic observation. Following the medication, she did wake up and she admitted that she had missed a dose of one of her medicines 3 days ago. Neurology consultation will be obtained to determine whether she should have her anticonvulsive regimen adjusted. Case was discussed with Dr. Blaine Hamper of [facility] who agrees to admit the patient under observation status.   I personally performed the services described in this documentation, which was scribed in my presence. The recorded information has been reviewed and is  accurate.        Delora Fuel, MD 99/37/16 [id number]

## 2014-12-10 NOTE — Progress Notes (Signed)
Dr. Candiss Norse in with assessment. [place] for patient to sign out AMA

## 2014-12-12 LAB — LEVETIRACETAM LEVEL: LEVETIRACETAM: 13.7 ug/mL (ref 10.0–40.0)

## 2014-12-30 ENCOUNTER — Emergency Department (HOSPITAL_COMMUNITY): Payer: Medicaid Other

## 2014-12-30 ENCOUNTER — Emergency Department (HOSPITAL_COMMUNITY)
Admission: EM | Admit: 2014-12-30 | Discharge: 2014-12-30 | Disposition: A | Discharge status: Home / Self Care | Payer: Medicaid Other | Attending: Emergency Medicine | Admitting: Emergency Medicine

## 2014-12-30 ENCOUNTER — Encounter (HOSPITAL_COMMUNITY): Payer: Self-pay | Admitting: Emergency Medicine

## 2014-12-30 DIAGNOSIS — I10 Essential (primary) hypertension: Secondary | ICD-10-CM | POA: Insufficient documentation

## 2014-12-30 DIAGNOSIS — Z21 Asymptomatic human immunodeficiency virus [HIV] infection status: Secondary | ICD-10-CM | POA: Insufficient documentation

## 2014-12-30 DIAGNOSIS — K219 Gastro-esophageal reflux disease without esophagitis: Secondary | ICD-10-CM | POA: Insufficient documentation

## 2014-12-30 DIAGNOSIS — Z79899 Other long term (current) drug therapy: Secondary | ICD-10-CM | POA: Diagnosis not present

## 2014-12-30 DIAGNOSIS — Z91040 Latex allergy status: Secondary | ICD-10-CM | POA: Diagnosis not present

## 2014-12-30 DIAGNOSIS — G8929 Other chronic pain: Secondary | ICD-10-CM | POA: Insufficient documentation

## 2014-12-30 DIAGNOSIS — M1990 Unspecified osteoarthritis, unspecified site: Secondary | ICD-10-CM | POA: Insufficient documentation

## 2014-12-30 DIAGNOSIS — Z7952 Long term (current) use of systemic steroids: Secondary | ICD-10-CM | POA: Diagnosis not present

## 2014-12-30 DIAGNOSIS — T426X5A Adverse effect of other antiepileptic and sedative-hypnotic drugs, initial encounter: Secondary | ICD-10-CM | POA: Diagnosis not present

## 2014-12-30 DIAGNOSIS — G40409 Other generalized epilepsy and epileptic syndromes, not intractable, without status epilepticus: Secondary | ICD-10-CM | POA: Insufficient documentation

## 2014-12-30 DIAGNOSIS — Z86718 Personal history of other venous thrombosis and embolism: Secondary | ICD-10-CM | POA: Insufficient documentation

## 2014-12-30 DIAGNOSIS — Z7951 Long term (current) use of inhaled steroids: Secondary | ICD-10-CM | POA: Insufficient documentation

## 2014-12-30 DIAGNOSIS — R569 Unspecified convulsions: Secondary | ICD-10-CM | POA: Diagnosis present

## 2014-12-30 DIAGNOSIS — J45909 Unspecified asthma, uncomplicated: Secondary | ICD-10-CM | POA: Insufficient documentation

## 2014-12-30 DIAGNOSIS — Z3202 Encounter for pregnancy test, result negative: Secondary | ICD-10-CM | POA: Diagnosis not present

## 2014-12-30 DIAGNOSIS — Z85841 Personal history of malignant neoplasm of brain: Secondary | ICD-10-CM | POA: Insufficient documentation

## 2014-12-30 DIAGNOSIS — Z720 Tobacco use: Secondary | ICD-10-CM | POA: Insufficient documentation

## 2014-12-30 DIAGNOSIS — Z86011 Personal history of benign neoplasm of the brain: Secondary | ICD-10-CM

## 2014-12-30 LAB — CBC WITH DIFFERENTIAL/PLATELET
Basophils Absolute: 0 10*3/uL (ref 0.0–0.1)
Basophils Relative: 0 %
EOS ABS: 0 10*3/uL (ref 0.0–0.7)
EOS PCT: 1 %
HCT: 40.3 % (ref 36.0–46.0)
Hemoglobin: 13.6 g/dL (ref 12.0–15.0)
LYMPHS ABS: 2.6 10*3/uL (ref 0.7–4.0)
LYMPHS PCT: 50 %
MCH: 31.7 pg (ref 26.0–34.0)
MCHC: 33.7 g/dL (ref 30.0–36.0)
MCV: 93.9 fL (ref 78.0–100.0)
MONO ABS: 0.3 10*3/uL (ref 0.1–1.0)
Monocytes Relative: 6 %
Neutro Abs: 2.2 10*3/uL (ref 1.7–7.7)
Neutrophils Relative %: 43 %
PLATELETS: 253 10*3/uL (ref 150–400)
RBC: 4.29 MIL/uL (ref 3.87–5.11)
RDW: 13.7 % (ref 11.5–15.5)
WBC: 5.2 10*3/uL (ref 4.0–10.5)

## 2014-12-30 LAB — URINALYSIS, ROUTINE W REFLEX MICROSCOPIC
Bilirubin Urine: NEGATIVE
Glucose, UA: NEGATIVE mg/dL
Ketones, ur: NEGATIVE mg/dL
LEUKOCYTES UA: NEGATIVE
NITRITE: NEGATIVE
PROTEIN: NEGATIVE mg/dL
Specific Gravity, Urine: 1.017 (ref 1.005–1.030)
UROBILINOGEN UA: 0.2 mg/dL (ref 0.0–1.0)
pH: 8 (ref 5.0–8.0)

## 2014-12-30 LAB — COMPREHENSIVE METABOLIC PANEL
ALT: 11 U/L — ABNORMAL LOW (ref 14–54)
ANION GAP: 11 (ref 5–15)
AST: 15 U/L (ref 15–41)
Albumin: 3.8 g/dL (ref 3.5–5.0)
Alkaline Phosphatase: 61 U/L (ref 38–126)
BUN: 9 mg/dL (ref 6–20)
CHLORIDE: 104 mmol/L (ref 101–111)
CO2: 23 mmol/L (ref 22–32)
CREATININE: 0.81 mg/dL (ref 0.44–1.00)
Calcium: 9.2 mg/dL (ref 8.9–10.3)
Glucose, Bld: 80 mg/dL (ref 65–99)
POTASSIUM: 4 mmol/L (ref 3.5–5.1)
Sodium: 138 mmol/L (ref 135–145)
Total Bilirubin: 0.7 mg/dL (ref 0.3–1.2)
Total Protein: 7.2 g/dL (ref 6.5–8.1)

## 2014-12-30 LAB — URINE MICROSCOPIC-ADD ON

## 2014-12-30 LAB — I-STAT BETA HCG BLOOD, ED (MC, WL, AP ONLY): I-stat hCG, quantitative: <5 m[IU]/mL (ref <5)

## 2014-12-30 LAB — I-STAT CG4 LACTIC ACID, ED: LACTIC ACID, VENOUS: 0.79 mmol/L (ref 0.5–2.0)

## 2014-12-30 LAB — VALPROIC ACID LEVEL

## 2014-12-30 MED ORDER — LORAZEPAM 2 MG/ML IJ SOLN
2.0000 mg | Freq: Once | INTRAMUSCULAR | Status: AC
  Administered 2014-12-30: 2 mg via INTRAVENOUS
  Filled 2014-12-30: qty 1

## 2014-12-30 MED ORDER — LEVETIRACETAM 500 MG PO TABS: 1000.0000 mg | ORAL_TABLET | Freq: Two times a day (BID) | ORAL | Status: DC

## 2014-12-30 MED ORDER — ONDANSETRON HCL 4 MG/2ML IJ SOLN
4.0000 mg | Freq: Once | INTRAMUSCULAR | Status: AC
  Administered 2014-12-30: 4 mg via INTRAVENOUS
  Filled 2014-12-30: qty 2

## 2014-12-30 MED ORDER — VALPROATE SODIUM 500 MG/5ML IV SOLN
1750.0000 mg | Freq: Once | INTRAVENOUS | Status: AC
  Administered 2014-12-30: 1750 mg via INTRAVENOUS
  Filled 2014-12-30: qty 17.5

## 2014-12-30 MED ORDER — LORAZEPAM 2 MG/ML IJ SOLN
1.0000 mg | Freq: Once | INTRAMUSCULAR | Status: AC
  Administered 2014-12-30: 1 mg via INTRAVENOUS
  Filled 2014-12-30: qty 1

## 2014-12-30 MED ORDER — ALPRAZOLAM 1 MG PO TABS: 2.0000 mg | ORAL_TABLET | Freq: Three times a day (TID) | ORAL | Status: DC | PRN

## 2014-12-30 MED ORDER — SODIUM CHLORIDE 0.9 % IV SOLN
INTRAVENOUS | Status: DC
  Administered 2014-12-30: 13:00:00 via INTRAVENOUS

## 2014-12-30 MED ORDER — SODIUM CHLORIDE 0.9 % IV SOLN
1000.0000 mg | Freq: Once | INTRAVENOUS | Status: AC
  Administered 2014-12-30: 1000 mg via INTRAVENOUS
  Filled 2014-12-30 (×2): qty 10

## 2014-12-30 MED ORDER — MORPHINE SULFATE (PF) 4 MG/ML IV SOLN
4.0000 mg | Freq: Once | INTRAVENOUS | Status: AC
  Administered 2014-12-30: 4 mg via INTRAVENOUS
  Filled 2014-12-30: qty 1

## 2014-12-30 MED ORDER — DIVALPROEX SODIUM 500 MG PO DR TAB: 1000.0000 mg | DELAYED_RELEASE_TABLET | Freq: Three times a day (TID) | ORAL | Status: DC

## 2014-12-30 MED ORDER — DIPHENHYDRAMINE HCL 50 MG/ML IJ SOLN
50.0000 mg | Freq: Once | INTRAMUSCULAR | Status: AC
Start: 2014-12-30 — End: 2014-12-30
  Administered 2014-12-30: 50 mg via INTRAVENOUS
  Filled 2014-12-30: qty 1

## 2014-12-30 MED ORDER — LORAZEPAM 2 MG/ML IJ SOLN: 1.0000 mg | Freq: Once | INTRAMUSCULAR | Status: DC

## 2014-12-30 NOTE — ED Notes (Signed)
Security remains at bedside

## 2014-12-30 NOTE — ED Notes (Signed)
MRI called pt is claustrophobic Dr. Reather Converse notified.  Bringing Ativan to MRI.

## 2014-12-30 NOTE — ED Notes (Signed)
Pt states she did not take her seizure medication today bc she hadn't eaten. Pt states she was outside of rehab building, standing up and talking on the phone. The next thing that happened, the pt states she woke up and people said she had a seizure. Pt was on the ground. Pt states her neck and arms are hurting and she has a HA

## 2014-12-30 NOTE — ED Notes (Signed)
Pt's boyfriend in room pulls out cigarette and has it in hand. RN asks if he will please take it outside to smoke. He leaves to go smoke outside. Minutes later pt starts yelling out into hallway- "you all are racist!! That white ass nurse bitch is racist telling him he can't smoke in here- he aint trying to smoke in here. I'll light up a blunt right here and smoke it to blow it all up." Pt continues to yell inappropriate language and statements into hallway. RN calls security. Security at bedside. Pt continues to be inappropriate and wants to leave AMA. RN notifies PA. PA at bedside to speak with pt.

## 2014-12-30 NOTE — ED Notes (Signed)
No abrasions noted

## 2014-12-30 NOTE — ED Notes (Signed)
Pt complaining about neck and arm pain. Notified RN that she needs an arm brace and neck brace. RN informed pt nothing is broken and these would not be necessary. RN spoke with PA. PA agrees they are not necessary but will give to pt if she persists. Pt still wants them. RN placed C collar and arm sling on pt per her request

## 2014-12-30 NOTE — Discharge Instructions (Signed)
[facility] should call you to set up an appointment in the next week. If you do not hear back from them please call them to set it up. Start taking the Keppra and Depakote twice a day as directed.  Please follow with your primary care doctor in the next 2 days for a check-up. They must obtain records for further management.   Do not hesitate to return to the Emergency Department for any new, worsening or concerning symptoms.    Seizure, Adult A seizure is abnormal electrical activity in the brain. Seizures usually last from 30 seconds to 2 minutes. There are various types of seizures. Before a seizure, you may have a warning sensation (aura) that a seizure is about to occur. An aura may include the following symptoms:   Fear or anxiety.  Nausea.  Feeling like the room is spinning (vertigo).  Vision changes, such as seeing flashing lights or spots. Common symptoms during a seizure include:  A change in attention or behavior (altered mental status).  Convulsions with rhythmic jerking movements.  Drooling.  Rapid eye movements.  Grunting.  Loss of bladder and bowel control.  Bitter taste in the mouth.  Tongue biting. After a seizure, you may feel confused and sleepy. You may also have an injury resulting from convulsions during the seizure. HOME CARE INSTRUCTIONS   If you are given medicines, take them exactly as prescribed by your health care provider.  Keep all follow-up appointments as directed by your health care provider.  Do not swim or drive or engage in risky activity during which a seizure could cause further injury to you or others until your health care provider says it is OK.  Get adequate rest.  Teach friends and family what to do if you have a seizure. They should:  Lay you on the ground to prevent a fall.  Put a cushion under your head.  Loosen any tight clothing around your neck.  Turn you on your side. If vomiting occurs, this  helps keep your airway clear.  Stay with you until you recover.  Know whether or not you need emergency care. SEEK IMMEDIATE MEDICAL CARE IF:  The seizure lasts longer than 5 minutes.  The seizure is severe or you do not wake up immediately after the seizure.  You have an altered mental status after the seizure.  You are having more frequent or worsening seizures. Someone should drive you to the emergency department or call local emergency services (911 in [place] MAKE SURE YOU:  Understand these instructions.  Will watch your condition.  Will get help right away if you are not doing well or get worse. Document Released: 03/29/2000 Document Revised: 01/20/2013 Document Reviewed: 11/11/2012 [facility] Patient Information 2015 [place], [place]. This information is not intended to replace advice given to you by your health care provider. Make sure you discuss any questions you have with your health care provider.   Emergency Department Resource Guide 1) Find a Doctor and Pay Out of Pocket Although you won't have to find out who is covered by your insurance plan, it is a good idea to ask around and get recommendations. You will then need to call the office and see if the doctor you have chosen will accept you as a new patient and what types of options they offer for patients who are self-pay. Some doctors offer discounts or will set up payment plans for their patients who do not have insurance, but you will need to ask  so you aren't surprised when you get to your appointment.  2) Contact Your Local Health Department Not all health departments have doctors that can see patients for sick visits, but many do, so it is worth a call to see if yours does. If you don't know where your local health department is, you can check in your phone book. The CDC also has a tool to help you locate your state's health department, and many state websites also have listings of all of their local health  departments.  3) Find a [facility] If your illness is not likely to be very severe or complicated, you may want to try a walk in clinic. These are popping up all over the country in pharmacies, drugstores, and shopping centers. They're usually staffed by nurse practitioners or physician assistants that have been trained to treat common illnesses and complaints. They're usually fairly quick and inexpensive. However, if you have serious medical issues or chronic medical problems, these are probably not your best option.  No Primary Care Doctor: - Call Health Connect at  [phone] - they can help you locate a primary care doctor that  accepts your insurance, provides certain services, etc. - Physician Referral Service- [phone]  Chronic Pain Problems: Organization         Address  Phone   Notes  [facility]  [phone] Patients need to be referred by their primary care doctor.   Medication Assistance: Organization         Address  Phone   Notes  [facility] Medication [facility] [address] [place], [place] [address] [phone] --Must be a resident of [facility] -- Must have NO insurance coverage whatsoever (no Medicaid/ Medicare, etc.) -- The pt. MUST have a primary care doctor that directs their care regularly and follows them in the community   Med[facility]ist  [phone]   [employer]  [phone]    Agencies that provide inexpensive medical care: Organization         Address  Phone   Notes  [place]  [phone]   [facility]    [phone]   [facility] [place], [place] [address] [phone]   [place] [address] [place] [phone]   Planned Parenthood    [phone]   [facility]    [phone]   [place] and [place] [address], [place] Phone:  [phone], Fax:  [phone] Hours of Operation:  9 am - 6 pm, M-F.  Also accepts Medicaid/Medicare and self-pay.  [facility] for [place] [place], [address], [place] Phone: [phone], Fax: [phone]. Hours of Operation:  8:30 am - 5:30 pm, M-F.  Also accepts Medicaid and self-pay.  [facility] [place] [address] [place] Phone: [phone]   [place], [place], [place] [phone], Ext. [phone] Mondays & Thursdays: 7-9 AM.  First 15 patients are seen on a first come, first serve basis.    [place] Providers:  Organization         Address  Phone   Notes  [facility] [address], [place] [phone] Also accepts self-pay patients.  [place], [place], [place]  [phone]  [place], [address], [place] [phone]   [place] [address] [place] [phone]   Lucianne Lei [address], [place]   [phone] Only accepts [place] Access [place] patients after they have their name applied to their card.   Self-Pay (no insurance) in [facility]:  Organization         Address  Phone   Notes  Sickle Cell Patients, [facility] [place] [phone]   [facility] [place] [phone]   [facility] [place]  [place], [place], [place] [phone]   [facility]/Dr. Osei-Bonsu  [address], [place] or [address], [place] [phone] Phone number for both [place] and [place] locations is the same.  Urgent Medical and [facility] [address], [place] [phone]   [facility] [address] [place] or [address] [phone]   [facility] [address], [place] [phone], phone; [phone], fax Sees patients 1st and 3rd Saturday of every month.  Must not qualify for public or private insurance (i.e. Medicaid, Medicare, [place] Health Choice, Veterans' Benefits)  Household income should be no more than 200% of the poverty level The clinic cannot treat you if you are pregnant or think you are pregnant  Sexually transmitted diseases are not treated at the clinic.    Dental Care: Organization         Address  Phone  Notes  [facility] of [facility] [place] [phone] Accepts children up to age 15 who are enrolled in [place] or [place]; pregnant women with a Medicaid card; and children who have applied for Medicaid or [place] Health Choice, but were declined, whose parents can pay a reduced fee at time of service.  [facility] Department of [facility]  [address], [place] [phone] Accepts children up to age 22 who are enrolled in [place] or [place]; pregnant women with a Medicaid card; and children who have applied for Medicaid or [place] Health Choice, but were declined, whose parents can pay a reduced fee at time of service.  [place] Adult Dental Access PROGRAM  [place] [phone] Patients are seen by appointment only. Walk-ins are not accepted. [place] will see patients 92 years of age and older. Monday - Tuesday (8am-5pm) Most Wednesdays (8:30-5pm) $30 per visit, cash only  [facility] Adult Dental Access PROGRAM  [address], [facility] [phone] Patients are seen by appointment only. Walk-ins are not accepted. [place] will see patients 90 years of age and older. One Wednesday Evening (Monthly: Volunteer Based).  $30 per visit, cash only  [place]  [phone] for adults;  Children under age 64, call Graduate Pediatric Dentistry at [phone]. Children aged 70-14, please call [phone] to request a pediatric application.  Dental services are provided in all areas of dental care including fillings, crowns and bridges, complete and partial dentures, implants, gum treatment, root canals, and extractions. Preventive care is also provided. Treatment is provided to both adults and children. Patients are selected via a lottery and there is often a waiting list.  [facility] [address] [place]  [phone] [email or web address]   [facility] [address] [place], [place] [phone], Ext. [phone] Second and Fourth Thursday of each month, opens at 6:30 AM; Clinic ends at 9 AM.  Patients are seen on a first-come first-served basis, and a limited number are seen during each clinic.   [facility]  [address] [place]ard Danker [place], [place] [phone]   Eligibility Requirements You must have lived in [place], [place], or [place] counties for at least the last three months.   You cannot be eligible for state or federal sponsored [employer], including [employer], [place], or [employer].   You generally cannot be eligible for healthcare insurance through your employer.    How to apply: Eligibility screenings are held every Tuesday and Wednesday afternoon from 1:00 pm until 4:00 pm. You do not need an appointment for the interview!  [facility] [address], [place], [place]   [place]  [place] Department  Deputy  [phone]    Behavioral Health Resources in the Community: Intensive Outpatient Programs Organization         Address  Phone  Notes  Webb Dickens. [address] [place], [place] [phone]   [facility] Outpatient [address] [place], [place]   [facility]: [facility] [address] [place], [place]   [place] [address],  [place], [place] or [phone]   Substance Abuse Resources Organization         Address  Phone  Notes  Alcohol and Drug Services  [phone]   [place]  [phone]   [facility] [place]   Chinita Pester  [phone]   Residential & Outpatient Substance Abuse Program  [phone]   Psychological Services Organization         Address  Phone  Notes  [facility]  [place]  [phone]   [place] [address], [place] or [phone]    Mobile Crisis Teams Organization         Address  Phone  Notes  [facility], Mobile Crisis Care Unit  [phone]   [facility]  [address] [place], [place]   [place] [address], [place] [phone]    Self-Help/Support Groups Organization         Address  Phone             Notes  [place]. of [place] - variety of support groups  [place] Call for more information  Narcotics Anonymous (NA), [facility] [address], [employer] [place]  2 meetings at this location   Special educational needs teacher         Address  Phone  Notes  [facility]idential [facility] [place],    [place]  [phone]   [facility]  [address] [place] [address], [place], [place]   [place], [place] [phone] Admissions: 8am-3pm M-F  Incentives Substance [place] [address]    [place], [place] [phone]   [facility] [address] [place], [place], [place]   The [place]  [place], [place]   [facility] Programs - Intensive  Outpatient [address]een [address], [place], [place]   [facility] ([place]) ([place] [address]  [place], [place] [phone] or [phone]   [facility] Services ([facility]) [address], [place], [place] Accepts Medicaid  [facility] [place] [address],  [place] [phone] Substance Abuse/Addiction Treatment   [facility] Organization         Address  Phone  Notes  [facility]  [phone]   Domenic Schwab, PhD [address] Arlis Porta [place], [place]   [phone] or [phone]   [place], [place] [phone]   [facility] [address] [place], [place] [phone] Insurance/Medicaid/sponsorship through [facility] and Families [address], [address]                                    [place], [place] [phone] [place] [address], [place] [phone]    Dr. Adele Schilder  [phone]   [facility] Dept. 1) [address], [place] 2) [place] 3)  [place] [address], [place] [phone]  [phone]   [place] [phone] or [phone] (After Hours)

## 2014-12-30 NOTE — ED Provider Notes (Signed)
Care assumed from [facility], PA-C  Harshini Marquina is a 39 y.o. 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  Her Depakote was recently decreased in dosage. Patient reports she's been compliant.  Patient reports she was admitted several weeks ago for seizure but left AGAINST MEDICAL ADVICE before her MRI could be performed.    PCP: [facility]  Physical Exam  BP 127/80 mmHg  Pulse 80  Temp(Src) 98.4 F (36.9 C) (Oral)  Resp 26  Ht 5\' 5"  (1.651 m)  Wt 250 lb (113.399 kg)  BMI 41.60 kg/m2  SpO2 100%  LMP 12/23/2014  Physical Exam  Face to face Exam:   General: Awake  HEENT: Atraumatic Neck: C-collar in place (pt reports this is more comfortable.) Resp: Normal effort  Abd: Nondistended  Neuro: No focal weakness MSK: decreased ROM of the right shoulder with pain   Results for orders placed or performed during the hospital encounter of 12/30/14  CBC with Differential  Result Value Ref Range   WBC 5.2 4.0 - 10.5 K/uL   RBC 4.29 3.87 - 5.11 MIL/uL   Hemoglobin 13.6 12.0 - 15.0 g/dL   HCT 40.3 36.0 - 46.0 %   MCV 93.9 78.0 - 100.0 fL   MCH 31.7 26.0 - 34.0 pg   MCHC 33.7 30.0 - 36.0 g/dL   RDW 13.7 11.5 - 15.5 %   Platelets 253 150 - 400 K/uL   Neutrophils Relative % 43 %   Neutro Abs 2.2 1.7 - 7.7 K/uL   Lymphocytes Relative 50 %   Lymphs Abs 2.6 0.7 - 4.0 K/uL   Monocytes Relative 6 %   Monocytes Absolute 0.3 0.1 - 1.0 K/uL   Eosinophils Relative 1 %   Eosinophils Absolute 0.0 0.0 - 0.7 K/uL   Basophils Relative 0 %   Basophils Absolute 0.0 0.0 - 0.1 K/uL  Comprehensive metabolic panel  Result Value Ref Range   Sodium 138 135 - 145 mmol/L   Potassium 4.0 3.5 - 5.1 mmol/L   Chloride 104 101 - 111 mmol/L   CO2 23 22 - 32 mmol/L   Glucose, Bld 80 65 - 99 mg/dL   BUN 9 6 - 20 mg/dL   Creatinine, Ser 0.81 0.44 - 1.00 mg/dL   Calcium 9.2 8.9 - 10.3 mg/dL   Total Protein 7.2 6.5 - 8.1 g/dL   Albumin 3.8 3.5 - 5.0 g/dL   AST 15 15 - 41  U/L   ALT 11 (L) 14 - 54 U/L   Alkaline Phosphatase 61 38 - 126 U/L   Total Bilirubin 0.7 0.3 - 1.2 mg/dL   GFR calc non Af Amer >60 >60 mL/min   GFR calc Af Amer >60 >60 mL/min   Anion gap 11 5 - 15  Urinalysis, Routine w reflex microscopic (not at [facility])  Result Value Ref Range   Color, Urine YELLOW YELLOW   APPearance CLEAR CLEAR   Specific Gravity, Urine 1.017 1.005 - 1.030   pH 8.0 5.0 - 8.0   Glucose, UA NEGATIVE NEGATIVE mg/dL   Hgb urine dipstick SMALL (A) NEGATIVE   Bilirubin Urine NEGATIVE NEGATIVE   Ketones, ur NEGATIVE NEGATIVE mg/dL   Protein, ur NEGATIVE NEGATIVE mg/dL   Urobilinogen, UA 0.2 0.0 - 1.0 mg/dL   Nitrite NEGATIVE NEGATIVE   Leukocytes, UA NEGATIVE NEGATIVE  Valproic acid level  Result Value Ref Range   Valproic Acid Lvl <10 (L) 50.0 - 100.0 ug/mL  Urine microscopic-add on  Result Value  Ref Range   Squamous Epithelial / LPF FEW (A) RARE   WBC, UA 0-2 <3 WBC/hpf   RBC / HPF 7-10 <3 RBC/hpf   Bacteria, UA RARE RARE  I-Stat Beta hCG blood, ED ([facility], [facility], [facility] only)  Result Value Ref Range   I-stat hCG, quantitative <5.0 <5 mIU/mL   Comment 3          I-Stat CG4 Lactic Acid, ED  Result Value Ref Range   Lactic Acid, Venous 0.79 0.5 - 2.0 mmol/L   Dg Chest 2 View  12/30/2014   CLINICAL DATA:  Post seizure with right shoulder pain.  EXAM: CHEST  2 VIEW  COMPARISON:  08/02/2014  FINDINGS: Lungs are hypoinflated and otherwise clear. Cardiomediastinal silhouette is within normal. Remainder of the exam is unchanged.  IMPRESSION: Hypoinflation without acute cardiopulmonary disease.   Electronically Signed   By: Marin Olp M.D.   On: 12/30/2014 13:33   Dg Shoulder Right  12/30/2014   CLINICAL DATA:  Patient had a seizure outside. Next thing she knew she was on the ground. Right neck, right shoulder pain.  EXAM: RIGHT SHOULDER - 2+ VIEW  COMPARISON:  None.  FINDINGS: There is no evidence of fracture or dislocation. There is no evidence of arthropathy or other focal  bone abnormality. Soft tissues are unremarkable.  IMPRESSION: Negative.   Electronically Signed   By: Nolon Nations M.D.   On: 12/30/2014 13:32   Dg Elbow Complete Right  12/30/2014   CLINICAL DATA:  Acute right elbow pain after fall due to seizure. Initial encounter.  EXAM: RIGHT ELBOW - COMPLETE 3+ VIEW  COMPARISON:  None.  FINDINGS: There is no evidence of fracture, dislocation, or joint effusion. There is no evidence of arthropathy or other focal bone abnormality. Soft tissues are unremarkable.  IMPRESSION: Normal right elbow.   Electronically Signed   By: Marijo Conception, M.D.   On: 12/30/2014 13:34   Dg Wrist Complete Right  12/30/2014   CLINICAL DATA:  Post seizure.  Right elbow on wrist pain.  EXAM: RIGHT WRIST - COMPLETE 3+ VIEW  COMPARISON:  None.  FINDINGS: There is no evidence of fracture or dislocation. There is no evidence of arthropathy or other focal bone abnormality. Soft tissues are unremarkable.  IMPRESSION: Negative.   Electronically Signed   By: Marin Olp M.D.   On: 12/30/2014 13:32   Ct Head Wo Contrast  12/30/2014   CLINICAL DATA:  Seizure with patient hitting head. Complaining of headache and neck pain.  EXAM: CT HEAD WITHOUT CONTRAST  CT CERVICAL SPINE WITHOUT CONTRAST  TECHNIQUE: Multidetector CT imaging of the head and cervical spine was performed following the standard protocol without intravenous contrast. Multiplanar CT image reconstructions of the cervical spine were also generated.  COMPARISON:  Head CT April 08, 2014; cervical spine and head CT CT February 01, 2013  FINDINGS: CT HEAD FINDINGS  The ventricles are normal in size and configuration. There is modest frontal atrophy bilaterally, stable. There is no intracranial mass, hemorrhage, extra-axial fluid collection, or midline shift. No gray-white compartment lesions are identified. No evident acute infarct. There is evidence of a previously noted left temporal craniotomy. Bony calvarium elsewhere appears intact.  The mastoid air cells clear. Multiple posterior calcifications along the falx and perifalcine regions appear stable.  CT CERVICAL SPINE FINDINGS  There is no fracture or spondylolisthesis. Prevertebral soft tissues and predental space regions are normal. There is moderate disc space narrowing at C3-4, C5-6, C6-7, and C7-T1.  There is no nerve root edema or effacement. No disc extrusion or stenosis.  A previously noted nodule in the right upper lobe near the apex is not visualized on this study; images did not extend as inferiorly at this time compared to the prior study from 2014.  IMPRESSION: CT head: Prior left temporal craniotomy defect. There is slight frontal atrophy bilaterally. No intracranial mass, hemorrhage, or extra-axial fluid collection. Gray-white compartments appear normal. Stable calcifications along the falx.  CT cervical spine: Osteoarthritic change at multiple levels. No fracture or spondylolisthesis.   Electronically Signed   By: Lowella Grip III M.D.   On: 12/30/2014 13:41   Ct Cervical Spine Wo Contrast  12/30/2014   CLINICAL DATA:  Seizure with patient hitting head. Complaining of headache and neck pain.  EXAM: CT HEAD WITHOUT CONTRAST  CT CERVICAL SPINE WITHOUT CONTRAST  TECHNIQUE: Multidetector CT imaging of the head and cervical spine was performed following the standard protocol without intravenous contrast. Multiplanar CT image reconstructions of the cervical spine were also generated.  COMPARISON:  Head CT April 08, 2014; cervical spine and head CT CT February 01, 2013  FINDINGS: CT HEAD FINDINGS  The ventricles are normal in size and configuration. There is modest frontal atrophy bilaterally, stable. There is no intracranial mass, hemorrhage, extra-axial fluid collection, or midline shift. No gray-white compartment lesions are identified. No evident acute infarct. There is evidence of a previously noted left temporal craniotomy. Bony calvarium elsewhere appears intact. The  mastoid air cells clear. Multiple posterior calcifications along the falx and perifalcine regions appear stable.  CT CERVICAL SPINE FINDINGS  There is no fracture or spondylolisthesis. Prevertebral soft tissues and predental space regions are normal. There is moderate disc space narrowing at C3-4, C5-6, C6-7, and C7-T1. There is no nerve root edema or effacement. No disc extrusion or stenosis.  A previously noted nodule in the right upper lobe near the apex is not visualized on this study; images did not extend as inferiorly at this time compared to the prior study from 2014.  IMPRESSION: CT head: Prior left temporal craniotomy defect. There is slight frontal atrophy bilaterally. No intracranial mass, hemorrhage, or extra-axial fluid collection. Gray-white compartments appear normal. Stable calcifications along the falx.  CT cervical spine: Osteoarthritic change at multiple levels. No fracture or spondylolisthesis.   Electronically Signed   By: Lowella Grip III M.D.   On: 12/30/2014 13:41   Mr Brain Wo Contrast  12/30/2014   CLINICAL DATA:  Breakthrough seizures. History of craniotomy for meningioma in 2012.  EXAM: MRI HEAD WITHOUT CONTRAST  TECHNIQUE: Multiplanar, multiecho pulse sequences of the brain and surrounding structures were obtained without intravenous contrast.  COMPARISON:  CT same day. Multiple previous CT set as distant as 09/24/2012. No prior MRI.  FINDINGS: Diffusion imaging does not show any acute or subacute infarction. The brainstem and cerebellum are normal. There is been a previous left pterional craniotomy. The brain has normal appearance except for a very small focus of cortical and subcortical gliosis of the lateral left temporal lobe underlying the craniotomy site. No mesial temporal abnormality. No evidence of ischemic infarction. No evidence of residual or recurrent mass lesion. No hydrocephalus or extra-axial fluid collection. The patient has a tendency towards dural  calcification, which is a normal variant. No pituitary mass. No inflammatory sinus disease.  IMPRESSION: No acute finding. Previous left pterional craniotomy for resection of meningioma by history. Normal appearance of the brain with exception of very small focus of cortical and  subcortical gliosis in the lateral left temporal lobe underlying the craniotomy site.   Electronically Signed   By: Nelson Chimes M.D.   On: 12/30/2014 18:17      ED Course  Procedures  1. H/O meningioma of the brain   2. Seizure secondary to subtherapeutic anticonvulsant medication     MDM  Plan: MRI pending to r/o recurrence of meningioma.  Pt resting comfortably at this time.    6:21 PM MRI without evidence of meningioma recurrence. Pt remains stable without further seizures.  Will d/c to home with depakote, keppra and xanax.  Pt will need to see psyc for Adderol Rx.  She ambulates without difficulty in the ED.  Tolerating PO without difficulty.    BP 127/80 mmHg  Pulse 80  Temp(Src) 98.4 F (36.9 C) (Oral)  Resp 26  Ht 5\' 5"  (1.651 m)  Wt 250 lb (113.399 kg)  BMI 41.60 kg/m2  SpO2 100%  LMP 12/23/2014     Jarrett Soho Muthersbaugh, PA-C 12/31/14 0104  Elnora Morrison, MD 01/05/15 2227

## 2014-12-30 NOTE — ED Notes (Signed)
[facility]. Pt sitting outside on phone with husband and getting stressed. Pt states she had seizure and then called EMS. EMS reports they were on scene within 4 min. Pt completely alert and oriented, no injury, no incontinence, no signs of seizure. Pt's husband causing problems and arguing. EKG shows SR, BP 132/88, HR 80
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
## 2014-12-30 NOTE — ED Notes (Signed)
Pt stable, ambulatory, states understanding of discharge instructions 

## 2015-01-11 ENCOUNTER — Telehealth: Payer: Self-pay | Admitting: Neurology

## 2015-01-11 NOTE — Telephone Encounter (Signed)
I called, talked with the caretakers. The patient has a revisit on 01/24/2015 at 8:30 AM.

## 2015-01-11 NOTE — Telephone Encounter (Signed)
[facility] [phone] called to advise patient is at their facility and has had over 10 seizures within the past week, once requiring a call to EMS. Meredith Mody states they are a mental health facility. Gwen feels her seizures are coming more frequently and closer together. Please call.

## 2015-01-24 ENCOUNTER — Encounter: Payer: Self-pay | Admitting: Neurology

## 2015-01-24 ENCOUNTER — Telehealth: Payer: Self-pay | Admitting: Neurology

## 2015-01-24 ENCOUNTER — Institutional Professional Consult (permissible substitution): Payer: Medicaid Other | Admitting: Neurology

## 2015-01-24 NOTE — Telephone Encounter (Signed)
This patient has had multiple no-shows for appointments, not seen since 2014, did not show for another visit today. We will discharge her from our practice.

## 2015-01-24 NOTE — Telephone Encounter (Signed)
This patient did not show for a revisit appointment today. 

## 2015-01-24 NOTE — Telephone Encounter (Signed)
I called the patient. Lori Ford skyped me to let me know the patient was continuing to have seizures and she had rescheduled the patient's appointment to another day. I advised the patient go to the ED if she is continuing to have seizures. She stated she is on the way there now. I also explained that we would no longer be able to treat her due to multiple no shows and not being seen since 2014 (per Dr. Jannifer Franklin' note). I advised she ask for a referral to another neurologist while she is in the ED. The patient hung up on me.

## 2015-02-02 ENCOUNTER — Institutional Professional Consult (permissible substitution): Payer: Medicaid Other | Admitting: Neurology

## 2015-03-30 ENCOUNTER — Encounter (HOSPITAL_COMMUNITY): Payer: Self-pay | Admitting: Emergency Medicine

## 2015-03-30 ENCOUNTER — Emergency Department (HOSPITAL_COMMUNITY): Payer: Medicaid Other

## 2015-03-30 ENCOUNTER — Emergency Department (HOSPITAL_COMMUNITY)
Admission: EM | Admit: 2015-03-30 | Discharge: 2015-03-30 | Disposition: A | Discharge status: Home / Self Care | Payer: Medicaid Other | Attending: Emergency Medicine | Admitting: Emergency Medicine

## 2015-03-30 DIAGNOSIS — B20 Human immunodeficiency virus [HIV] disease: Secondary | ICD-10-CM | POA: Diagnosis not present

## 2015-03-30 DIAGNOSIS — Z794 Long term (current) use of insulin: Secondary | ICD-10-CM | POA: Diagnosis not present

## 2015-03-30 DIAGNOSIS — Z7951 Long term (current) use of inhaled steroids: Secondary | ICD-10-CM | POA: Insufficient documentation

## 2015-03-30 DIAGNOSIS — Z79899 Other long term (current) drug therapy: Secondary | ICD-10-CM | POA: Diagnosis not present

## 2015-03-30 DIAGNOSIS — F17210 Nicotine dependence, cigarettes, uncomplicated: Secondary | ICD-10-CM | POA: Insufficient documentation

## 2015-03-30 DIAGNOSIS — M1990 Unspecified osteoarthritis, unspecified site: Secondary | ICD-10-CM | POA: Insufficient documentation

## 2015-03-30 DIAGNOSIS — G8929 Other chronic pain: Secondary | ICD-10-CM | POA: Diagnosis not present

## 2015-03-30 DIAGNOSIS — H578 Other specified disorders of eye and adnexa: Secondary | ICD-10-CM | POA: Insufficient documentation

## 2015-03-30 DIAGNOSIS — J45901 Unspecified asthma with (acute) exacerbation: Secondary | ICD-10-CM | POA: Diagnosis not present

## 2015-03-30 DIAGNOSIS — R079 Chest pain, unspecified: Secondary | ICD-10-CM | POA: Insufficient documentation

## 2015-03-30 DIAGNOSIS — Z791 Long term (current) use of non-steroidal anti-inflammatories (NSAID): Secondary | ICD-10-CM | POA: Insufficient documentation

## 2015-03-30 DIAGNOSIS — Z86718 Personal history of other venous thrombosis and embolism: Secondary | ICD-10-CM | POA: Insufficient documentation

## 2015-03-30 DIAGNOSIS — Z86018 Personal history of other benign neoplasm: Secondary | ICD-10-CM | POA: Diagnosis not present

## 2015-03-30 DIAGNOSIS — J0100 Acute maxillary sinusitis, unspecified: Secondary | ICD-10-CM | POA: Insufficient documentation

## 2015-03-30 DIAGNOSIS — A084 Viral intestinal infection, unspecified: Secondary | ICD-10-CM | POA: Diagnosis not present

## 2015-03-30 DIAGNOSIS — I10 Essential (primary) hypertension: Secondary | ICD-10-CM | POA: Insufficient documentation

## 2015-03-30 DIAGNOSIS — R0602 Shortness of breath: Secondary | ICD-10-CM

## 2015-03-30 DIAGNOSIS — Z3202 Encounter for pregnancy test, result negative: Secondary | ICD-10-CM | POA: Insufficient documentation

## 2015-03-30 DIAGNOSIS — Z91040 Latex allergy status: Secondary | ICD-10-CM | POA: Diagnosis not present

## 2015-03-30 DIAGNOSIS — G40909 Epilepsy, unspecified, not intractable, without status epilepticus: Secondary | ICD-10-CM | POA: Insufficient documentation

## 2015-03-30 DIAGNOSIS — Z7952 Long term (current) use of systemic steroids: Secondary | ICD-10-CM | POA: Diagnosis not present

## 2015-03-30 DIAGNOSIS — R05 Cough: Secondary | ICD-10-CM | POA: Diagnosis present

## 2015-03-30 DIAGNOSIS — K219 Gastro-esophageal reflux disease without esophagitis: Secondary | ICD-10-CM | POA: Insufficient documentation

## 2015-03-30 LAB — COMPREHENSIVE METABOLIC PANEL
ALBUMIN: 3.5 g/dL (ref 3.5–5.0)
ALK PHOS: 55 U/L (ref 38–126)
ALT: 12 U/L — ABNORMAL LOW (ref 14–54)
AST: 15 U/L (ref 15–41)
Anion gap: 7 (ref 5–15)
BILIRUBIN TOTAL: 0.6 mg/dL (ref 0.3–1.2)
BUN: 7 mg/dL (ref 6–20)
CALCIUM: 8.8 mg/dL — AB (ref 8.9–10.3)
CO2: 25 mmol/L (ref 22–32)
Chloride: 107 mmol/L (ref 101–111)
Creatinine, Ser: 0.83 mg/dL (ref 0.44–1.00)
GFR calc Af Amer: >60 mL/min (ref >60)
GFR calc non Af Amer: >60 mL/min (ref >60)
GLUCOSE: 109 mg/dL — AB (ref 65–99)
Potassium: 3.8 mmol/L (ref 3.5–5.1)
Sodium: 139 mmol/L (ref 135–145)
TOTAL PROTEIN: 6.7 g/dL (ref 6.5–8.1)

## 2015-03-30 LAB — URINALYSIS, ROUTINE W REFLEX MICROSCOPIC
BILIRUBIN URINE: NEGATIVE
GLUCOSE, UA: NEGATIVE mg/dL
Ketones, ur: NEGATIVE mg/dL
Leukocytes, UA: NEGATIVE
Nitrite: NEGATIVE
PH: 7.5 (ref 5.0–8.0)
Protein, ur: NEGATIVE mg/dL
SPECIFIC GRAVITY, URINE: 1.015 (ref 1.005–1.030)

## 2015-03-30 LAB — CBC WITH DIFFERENTIAL/PLATELET
BASOS ABS: 0 10*3/uL (ref 0.0–0.1)
BASOS PCT: 1 %
Eosinophils Absolute: 0.1 10*3/uL (ref 0.0–0.7)
Eosinophils Relative: 1 %
HEMATOCRIT: 38.7 % (ref 36.0–46.0)
HEMOGLOBIN: 12.8 g/dL (ref 12.0–15.0)
LYMPHS ABS: 2.9 10*3/uL (ref 0.7–4.0)
LYMPHS PCT: 52 %
MCH: 31 pg (ref 26.0–34.0)
MCHC: 33.1 g/dL (ref 30.0–36.0)
MCV: 93.7 fL (ref 78.0–100.0)
MONO ABS: 0.3 10*3/uL (ref 0.1–1.0)
MONOS PCT: 5 %
NEUTROS ABS: 2.3 10*3/uL (ref 1.7–7.7)
Neutrophils Relative %: 41 %
Platelets: 205 10*3/uL (ref 150–400)
RBC: 4.13 MIL/uL (ref 3.87–5.11)
RDW: 14.3 % (ref 11.5–15.5)
WBC: 5.6 10*3/uL (ref 4.0–10.5)

## 2015-03-30 LAB — LIPASE, BLOOD: Lipase: 23 U/L (ref 11–51)

## 2015-03-30 LAB — URINE MICROSCOPIC-ADD ON: BACTERIA UA: NONE SEEN

## 2015-03-30 LAB — I-STAT TROPONIN, ED: Troponin i, poc: 0 ng/mL (ref 0.00–0.08)

## 2015-03-30 LAB — PREGNANCY, URINE: Preg Test, Ur: NEGATIVE

## 2015-03-30 MED ORDER — ONDANSETRON 4 MG PO TBDP: 4.0000 mg | ORAL_TABLET | Freq: Three times a day (TID) | ORAL | Status: DC | PRN

## 2015-03-30 MED ORDER — IOHEXOL 350 MG/ML SOLN
100.0000 mL | Freq: Once | INTRAVENOUS | Status: AC | PRN
  Administered 2015-03-30: 100 mL via INTRAVENOUS

## 2015-03-30 MED ORDER — OXYMETAZOLINE HCL 0.05 % NA SOLN: 1.0000 | Freq: Two times a day (BID) | NASAL | Status: DC

## 2015-03-30 MED ORDER — ONDANSETRON HCL 4 MG/2ML IJ SOLN
4.0000 mg | Freq: Once | INTRAMUSCULAR | Status: AC
  Administered 2015-03-30: 4 mg via INTRAVENOUS
  Filled 2015-03-30: qty 2

## 2015-03-30 MED ORDER — BENZONATATE 100 MG PO CAPS: 200.0000 mg | ORAL_CAPSULE | Freq: Two times a day (BID) | ORAL | Status: DC | PRN

## 2015-03-30 MED ORDER — ACETAMINOPHEN 325 MG PO TABS
650.0000 mg | ORAL_TABLET | Freq: Once | ORAL | Status: AC
  Administered 2015-03-30: 650 mg via ORAL
  Filled 2015-03-30: qty 2

## 2015-03-30 MED ORDER — SODIUM CHLORIDE 0.9 % IV BOLUS (SEPSIS)
1000.0000 mL | Freq: Once | INTRAVENOUS | Status: AC
  Administered 2015-03-30: 1000 mL via INTRAVENOUS

## 2015-03-30 NOTE — ED Notes (Signed)
Patient transported to CT 

## 2015-03-30 NOTE — ED Provider Notes (Signed)
CSN: [id number]     Arrival date & time 03/30/15  0845 History   First MD Initiated Contact with Patient 03/30/15 [phone]     Chief Complaint  Patient presents with  . Cough  . URI     (Consider location/radiation/quality/duration/timing/severity/associated sxs/prior Treatment) HPI   Patient is a 39 year old female with past medical history of seizures, asthma, HTN, DVT (IVC filter placed appx. 2 years ago) and HIV who presents to the ED with complaint of sinus pressure, onset 4 days. Patient reports having worsening sinus pressure with associated subjective fever, nasal congestion, rhinorrhea, watery eyes, sore throat, nonproductive cough, SOB, wheezing and chest tightness. She also reports having mild aching periumbilical pain that started 2 days ago with associated nausea, NBNB vomiting (x4 daily) and nonbloody diarrhea (x3 daily). She notes she has not been able to keep anything down. Denies CP, palpitations, urinary sxs, blood in urine or stool, vaginal bleeding, vaginal d/c. Pt states she has been taking tylenol cold, OTC antitussive, throat lozenges, and her at home allergy meds, albuterol and neb tx without any relief. Denies any sick contacts. Pt denies being sexually active and states her LMP was 1week ago. She notes having chronic lower abdominal/pelvic pain that she is being evaluated by her GYN and notes she had a [place] scheduled for further evaluation.    Past Medical History  Diagnosis Date  . Seizure disorder, grand mal ([place])     dx 2005  . Seizures ([place])     due to head trauma as adult  . Asthma     as child  . Meningioma ([place])   . Gallstones     s/p cholecystectomy  . HTN (hypertension)   . GERD (gastroesophageal reflux disease)   . Allergic rhinitis   . Cigarette nicotine dependence with withdrawal   . DVT (deep venous thrombosis) ([place])   . Chronic back pain   . Sciatic pain   . Arthritis   . HIV (human immunodeficiency virus infection) [facility])    Past Surgical History   Procedure Laterality Date  . Brain surgery      2013 to remove a meningioma  . Cholecystectomy     Family History  Problem Relation Age of Onset  . Other Mother     varicose vein  . Asthma Mother   . High blood pressure Mother   . Deep vein thrombosis Neg Hx   . Pulmonary embolism Neg Hx   . Cancer    . Diabetes    . High blood pressure    . Asthma    . Thyroid disease    . Cancer Father    Social History  Substance Use Topics  . Smoking status: Current Every Day Smoker -- 0.25 packs/day for 18 years    Types: Cigarettes  . Smokeless tobacco: Never Used  . Alcohol Use: No   OB History    Gravida Para Term Preterm AB TAB SAB Ectopic Multiple Living   3 1 1  2 2    1      Review of Systems  Constitutional: Positive for fever.  HENT: Positive for congestion, rhinorrhea, sinus pressure and sore throat.   Eyes: Positive for discharge (watery).  Respiratory: Positive for cough, chest tightness, shortness of breath and wheezing.   Gastrointestinal: Positive for nausea, vomiting, abdominal pain and diarrhea.  All other systems reviewed and are negative.     Allergies  Ibuprofen; Clindamycin/lincomycin; Coconut flavor; Flagyl; Latex; Other; and Aspirin  Home Medications  Prior to Admission medications   Medication Sig Start Date End Date Taking? Authorizing Provider  Abacavir-Dolutegravir-Lamivud (TRIUMEQ) 600-50-300 MG TABS Take 1 tablet by mouth daily. 06/23/14  Yes Truman Hayward, MD  albuterol (PROVENTIL HFA;VENTOLIN HFA) 108 (90 BASE) MCG/ACT inhaler Inhale 2 puffs into the lungs every 4 (four) hours as needed for wheezing or shortness of breath. 08/02/14  Yes Britt Bottom, NP  albuterol (PROVENTIL) (2.5 MG/3ML) 0.083% nebulizer solution Take 2.5 mg by nebulization every 6 (six) hours as needed for wheezing or shortness of breath.   Yes Historical Provider, MD  ALPRAZolam Duanne Moron) 1 MG tablet Take 2 tablets (2 mg total) by mouth 3 (three) times daily as  needed for anxiety. 12/30/14  Yes Nicole Pisciotta, PA-C  amphetamine-dextroamphetamine (ADDERALL XR) 30 MG 24 hr capsule Take 30 mg by mouth 2 (two) times daily.   Yes Historical Provider, MD  beclomethasone (QVAR) 40 MCG/ACT inhaler Inhale 2 puffs into the lungs 2 (two) times daily as needed (for shortness of breath).    Yes Historical Provider, MD  cyclobenzaprine (FLEXERIL) 10 MG tablet Take 1 tablet (10 mg total) by mouth 2 (two) times daily as needed for muscle spasms. 02/01/13  Yes Larence Penning, MD  divalproex (DEPAKOTE) 500 MG DR tablet Take 2 tablets (1,000 mg total) by mouth 3 (three) times daily. 12/30/14  Yes Nicole Pisciotta, PA-C  fluticasone (FLONASE) 50 MCG/ACT nasal spray Place 1 spray into the nose 2 (two) times daily.   Yes Historical Provider, MD  folic acid (FOLVITE) 1 MG tablet Take 1 mg by mouth daily.   Yes Historical Provider, MD  gabapentin (NEURONTIN) 400 MG capsule Take 800 mg by mouth 3 (three) times daily.    Yes Historical Provider, MD  hydrochlorothiazide (HYDRODIURIL) 25 MG tablet Take 1 tablet (25 mg total) by mouth daily. 07/09/13  Yes Harvie Heck, PA-C  HYDROcodone-acetaminophen (NORCO/VICODIN) 5-325 MG per tablet Take 1 tablet by mouth every 4 (four) hours as needed. 08/06/14  Yes Kristen N Ward, DO  insulin glargine (LANTUS) 100 UNIT/ML injection Inject 10 Units into the skin daily.   Yes Historical Provider, MD  levETIRAcetam (KEPPRA) 500 MG tablet Take 2 tablets (1,000 mg total) by mouth 2 (two) times daily. 12/30/14  Yes Nicole Pisciotta, PA-C  loratadine (CLARITIN) 10 MG tablet Take 10 mg by mouth daily.  03/03/14  Yes Historical Provider, MD  metroNIDAZOLE (METROGEL VAGINAL) 0.75 % vaginal gel Place 1 Applicatorful vaginally 2 (two) times daily. X 5 days 08/30/14  Yes Mercedes Camprubi-Soms, PA-C  mirtazapine (REMERON) 15 MG tablet Take 20 mg by mouth at bedtime.    Yes Historical Provider, MD  omeprazole (PRILOSEC) 20 MG capsule Take 20 mg by mouth 2 (two) times  daily.   Yes Historical Provider, MD  oxyCODONE-acetaminophen (PERCOCET) 10-325 MG per tablet Take 1 tablet by mouth every 4 (four) hours as needed for pain.   Yes Historical Provider, MD  PARoxetine (PAXIL) 40 MG tablet Take 1 tablet (40 mg total) by mouth daily. 07/09/13  Yes Lauren Jerline Pain, PA-C  Phenylephrine-DM-GG-APAP (MUCINEX FAST-MAX CONGEST COLD) 5-10-200-325 MG TABS Take 2 tablets by mouth 3 (three) times daily. 08/02/14  Yes Britt Bottom, NP  promethazine (PHENERGAN) 6.25 MG/5ML syrup Take 12.5 mg by mouth every 6 (six) hours as needed for nausea or vomiting.   Yes Historical Provider, MD  propranolol (INDERAL) 10 MG tablet Take 10 mg by mouth 2 (two) times daily.  01/28/14  Yes Historical Provider, MD  sitaGLIPtin (JANUVIA) 50  MG tablet Take 50 mg by mouth daily.   Yes Historical Provider, MD  sodium chloride (OCEAN) 0.65 % nasal spray Place 1 spray into the nose 2 (two) times daily as needed for congestion.    Yes Historical Provider, MD  triamcinolone ointment (KENALOG) 0.5 % Apply 1 application topically 2 (two) times daily.   Yes Historical Provider, MD  ZOLMitriptan (ZOMIG) 2.5 MG tablet Take 1 tablet by mouth 4 (four) times daily. 03/25/15  Yes Historical Provider, MD  benzocaine (HURRICAINE) 20 % oral spray Apply 1 application topically 3 (three) times daily as needed for pain.    Historical Provider, MD  benzonatate (TESSALON) 100 MG capsule Take 2 capsules (200 mg total) by mouth 2 (two) times daily as needed for cough. 03/30/15   Nona Dell, PA-C  diazepam (VALIUM) 10 MG tablet Take 10 mg by mouth every 6 (six) hours as needed for anxiety.    Historical Provider, MD  HYDROcodone-acetaminophen (NORCO/VICODIN) 5-325 MG per tablet Take 1-2 tablets by mouth every 6 (six) hours as needed. 10/29/14   Dahlia Bailiff, PA-C  Mefenamic Acid 250 MG CAPS Take 2 capsules (500 mg total) by mouth 3 (three) times daily. X 4 days 08/30/14   Mercedes Camprubi-Soms, PA-C  meloxicam (MOBIC)  15 MG tablet Take 1 tablet (15 mg total) by mouth daily. 12/07/14   Tiffany Carlota Raspberry, PA-C  methocarbamol (ROBAXIN) 500 MG tablet Take 1 tablet (500 mg total) by mouth 2 (two) times daily. 12/07/14   Tiffany Carlota Raspberry, PA-C  ondansetron (ZOFRAN ODT) 4 MG disintegrating tablet Take 1 tablet (4 mg total) by mouth every 8 (eight) hours as needed for nausea or vomiting. 03/30/15   Nona Dell, PA-C  oxymetazoline (AFRIN NASAL SPRAY) 0.05 % nasal spray Place 1 spray into both nostrils 2 (two) times daily. 03/30/15   Chesley Noon Nadeau, PA-C   BP 146/100 mmHg  Pulse 73  Temp(Src) 97.8 F (36.6 C)  Resp 20  Ht 5\' 5"  (1.651 m)  Wt 113.399 kg  BMI 41.60 kg/m2  SpO2 98%  LMP 03/26/2015 Physical Exam  Constitutional: She is oriented to person, place, and time. She appears well-developed and well-nourished. No distress.  HENT:  Head: Normocephalic and atraumatic.  Right Ear: Tympanic membrane normal.  Left Ear: Tympanic membrane normal.  Nose: Rhinorrhea present. Right sinus exhibits maxillary sinus tenderness and frontal sinus tenderness. Left sinus exhibits maxillary sinus tenderness and frontal sinus tenderness.  Mouth/Throat: Uvula is midline, oropharynx is clear and moist and mucous membranes are normal. No oropharyngeal exudate, posterior oropharyngeal edema or posterior oropharyngeal erythema.  Eyes: Conjunctivae and EOM are normal. Right eye exhibits no discharge. Left eye exhibits no discharge. No scleral icterus.  Neck: Normal range of motion. Neck supple.  Cardiovascular: Normal rate, regular rhythm, normal heart sounds and intact distal pulses.   Pulmonary/Chest: Effort normal and breath sounds normal. No respiratory distress. She has no wheezes. She has no rales. She exhibits tenderness (mild TTP at midsternal chest wall).  Abdominal: Soft. Bowel sounds are normal. She exhibits no distension and no mass. There is tenderness (mild TTP at periumbilical region). There is no rebound  and no guarding.  Musculoskeletal: Normal range of motion. She exhibits no edema.  Lymphadenopathy:    She has no cervical adenopathy.  Neurological: She is alert and oriented to person, place, and time.  Skin: Skin is warm and dry. She is not diaphoretic.  Nursing note and vitals reviewed.   ED Course  Procedures (including critical  care time) Labs Review Labs Reviewed  COMPREHENSIVE METABOLIC PANEL - Abnormal; Notable for the following:    Glucose, Bld 109 (*)    Calcium 8.8 (*)    ALT 12 (*)    All other components within normal limits  URINALYSIS, ROUTINE W REFLEX MICROSCOPIC (NOT AT [facility]) - Abnormal; Notable for the following:    Hgb urine dipstick TRACE (*)    All other components within normal limits  URINE MICROSCOPIC-ADD ON - Abnormal; Notable for the following:    Squamous Epithelial / LPF 0-5 (*)    All other components within normal limits  CBC WITH DIFFERENTIAL/PLATELET  LIPASE, BLOOD  PREGNANCY, URINE  I-STAT TROPOININ, ED    Imaging Review Dg Chest 2 View  03/30/2015  CLINICAL DATA:  Central chest pain and shortness of Breath EXAM: CHEST - 2 VIEW COMPARISON:  9/16/6 FINDINGS: The heart size and mediastinal contours are within normal limits. Both lungs are clear. The visualized skeletal structures are unremarkable. IMPRESSION: No active disease. Electronically Signed   By: Inez Catalina M.D.   On: 03/30/2015 11:00   Ct Angio Chest Pe W/cm &/or Wo Cm  03/30/2015  CLINICAL DATA:  Worsening bronchitis over 4-5 days, productive cough with green sputum, fever, upper midline chest pain, shortness of breath, history asthma, hypertension, GERD, HIV EXAM: CT ANGIOGRAPHY CHEST WITH CONTRAST TECHNIQUE: Multidetector CT imaging of the chest was performed using the standard protocol during bolus administration of intravenous contrast. Multiplanar CT image reconstructions and MIPs were obtained to evaluate the vascular anatomy. CONTRAST:  125mL OMNIPAQUE IOHEXOL 350 MG/ML SOLN IV  COMPARISON:  10/06/2012 FINDINGS: Aorta normal caliber without aneurysm or dissection. Degradation of image quality secondary to body habitus. Pulmonary arteries well opacified and patent. No evidence of pulmonary embolism. Residual thymic tissue anterior mediastinum. No thoracic adenopathy. Visualized upper abdomen unremarkable. Stable 5 mm RIGHT apex nodule image 14. Minimal subsegmental atelectasis RIGHT lower lobe. No pulmonary infiltrate, pleural effusion or pneumothorax. Degenerative disc disease changes inferior cervical spine. No acute osseous abnormalities. Review of the MIP images confirms the above findings. IMPRESSION: No evidence of pulmonary embolism. Stable 5 mm RIGHT apex pulmonary nodule, stability of which since June 2014 suggests a benign process. Electronically Signed   By: Lavonia Dana M.D.   On: 03/30/2015 11:33   I have personally reviewed and evaluated these images and lab results as part of my medical decision-making.   EKG Interpretation   Date/Time:  Thursday March 30 2015 08:57:48 EST Ventricular Rate:  84 PR Interval:  119 QRS Duration: 82 QT Interval:  357 QTC Calculation: 422 R Axis:   30 Text Interpretation:  Sinus rhythm Borderline short PR interval Abnormal  R-wave progression, early transition Inferior infarct, old New Q waves in  III, aVF with T wave inversion III, concerning for  old infarct Confirmed  by LITTLE MD, RACHEL [phone]) on 03/30/2015 9:07:27 AM      MDM   Final diagnoses:  SOB (shortness of breath)  Chest pain  Acute maxillary sinusitis, recurrence not specified  Viral gastroenteritis    Patient presents with fever, nasal congestion, rhinorrhea, sore throat, cough, chest tightness, abdominal pain, nausea, vomiting, diarrhea. VSS. Exam revealed rhinorrhea, bilateral maxillary and frontal sinus tenderness. Lungs CTAB, midsternal chest wall tender to palpation. Mild tenderness to palpation at periumbilical region, no peritoneal signs.  Patient given IV fluids and Zofran.  Labs and UA unremarkable. Pregnancy negative. Troponin negative. EKG revealed Q wavesin leads III and aVF and inverted T waves  in leads III, changed from prior. Upon further questioning, pt reports having positive family hx of CAD, hx of DVT (appx, 2 years ago, IVC filter placed), bilateral leg swelling and calf pain, she reports recent immobilization and notes she has been laying in bed for the past 2-3 days, denies recent surgery, hemoptysis or hx of cancer. Due to positive risk factors for PE and EKG changes concerning for cardiac ischemia with order troponin and CT angio. Chest x-ray revealed no active disease. CT angioma revealed no evidence of PE but showed stable 5 mm right apex pulmonary nodule consistent with imaging done on June 2014 suggesting a benign process.  Consulted cardiology regarding new EKG changes. Dr. Johnsie Cancel advised that the EKG changes are nonspecific and with pt's negative cardiac workup she does not need cardiology follow up. I suspect patient's symptoms are likely due to to viral sinusitis and viral gastroenteritis. I have a low suspicion for ACS, PE, dissection, or other acute cardiac event at this time. Patient reports nausea has improved, patient able tolerate by mouth. Plan to discharge patient home with symptomatic treatment. Advised patient to follow up with her PCP at her scheduled appointment tomorrow.  Evaluation does not show pathology requring ongoing emergent intervention or admission. Pt is hemodynamically stable and mentating appropriately. Discussed findings/results and plan with patient/guardian, who agrees with plan. All questions answered. Return precautions discussed and outpatient follow up given.    Chesley Noon [place], [place] 03/30/15 [place], MD 03/31/15 [phone]

## 2015-03-30 NOTE — ED Notes (Signed)
Family at bedside. 

## 2015-03-30 NOTE — ED Notes (Signed)
Patient transported to X-ray 

## 2015-03-30 NOTE — ED Notes (Signed)
PIV attempted x 2 unsuccessful

## 2015-03-30 NOTE — ED Notes (Signed)
Pt sts cough and chest congestion x 4 days with pain with cough

## 2015-03-30 NOTE — Discharge Instructions (Signed)
Take your medications as prescribed. You may also take Tylenol as prescribed over-the-counter as needed for pain relief. Continue drinking fluids to remain hydrated. You may also drink warm water with honey to help alleviate your sore throat and cough. Follow-up with your primary care provider in 3-4 days. Return to the emergency department if symptoms worsen or new onset of fever, chest pain, difficulty breathing, vomiting blood, coughing up blood, blood in stool.

## 2015-06-21 ENCOUNTER — Encounter (HOSPITAL_COMMUNITY): Payer: Self-pay | Admitting: Emergency Medicine

## 2015-06-21 ENCOUNTER — Emergency Department (HOSPITAL_COMMUNITY)
Admission: EM | Admit: 2015-06-21 | Discharge: 2015-06-21 | Disposition: A | Discharge status: Home / Self Care | Payer: Medicaid Other | Attending: Emergency Medicine | Admitting: Emergency Medicine

## 2015-06-21 ENCOUNTER — Emergency Department (HOSPITAL_COMMUNITY): Payer: Medicaid Other

## 2015-06-21 DIAGNOSIS — F17210 Nicotine dependence, cigarettes, uncomplicated: Secondary | ICD-10-CM | POA: Diagnosis not present

## 2015-06-21 DIAGNOSIS — F1210 Cannabis abuse, uncomplicated: Secondary | ICD-10-CM | POA: Insufficient documentation

## 2015-06-21 DIAGNOSIS — M1990 Unspecified osteoarthritis, unspecified site: Secondary | ICD-10-CM | POA: Insufficient documentation

## 2015-06-21 DIAGNOSIS — Z7952 Long term (current) use of systemic steroids: Secondary | ICD-10-CM | POA: Diagnosis not present

## 2015-06-21 DIAGNOSIS — Z86718 Personal history of other venous thrombosis and embolism: Secondary | ICD-10-CM | POA: Insufficient documentation

## 2015-06-21 DIAGNOSIS — K219 Gastro-esophageal reflux disease without esophagitis: Secondary | ICD-10-CM | POA: Insufficient documentation

## 2015-06-21 DIAGNOSIS — B20 Human immunodeficiency virus [HIV] disease: Secondary | ICD-10-CM | POA: Insufficient documentation

## 2015-06-21 DIAGNOSIS — Z7984 Long term (current) use of oral hypoglycemic drugs: Secondary | ICD-10-CM | POA: Insufficient documentation

## 2015-06-21 DIAGNOSIS — Z86011 Personal history of benign neoplasm of the brain: Secondary | ICD-10-CM | POA: Diagnosis not present

## 2015-06-21 DIAGNOSIS — G8929 Other chronic pain: Secondary | ICD-10-CM | POA: Insufficient documentation

## 2015-06-21 DIAGNOSIS — G40909 Epilepsy, unspecified, not intractable, without status epilepticus: Secondary | ICD-10-CM | POA: Diagnosis not present

## 2015-06-21 DIAGNOSIS — J45909 Unspecified asthma, uncomplicated: Secondary | ICD-10-CM | POA: Insufficient documentation

## 2015-06-21 DIAGNOSIS — I10 Essential (primary) hypertension: Secondary | ICD-10-CM | POA: Diagnosis not present

## 2015-06-21 DIAGNOSIS — F1510 Other stimulant abuse, uncomplicated: Secondary | ICD-10-CM | POA: Insufficient documentation

## 2015-06-21 DIAGNOSIS — Z91040 Latex allergy status: Secondary | ICD-10-CM | POA: Insufficient documentation

## 2015-06-21 DIAGNOSIS — F1310 Sedative, hypnotic or anxiolytic abuse, uncomplicated: Secondary | ICD-10-CM | POA: Insufficient documentation

## 2015-06-21 DIAGNOSIS — Z794 Long term (current) use of insulin: Secondary | ICD-10-CM | POA: Diagnosis not present

## 2015-06-21 DIAGNOSIS — F1410 Cocaine abuse, uncomplicated: Secondary | ICD-10-CM | POA: Insufficient documentation

## 2015-06-21 DIAGNOSIS — Z79899 Other long term (current) drug therapy: Secondary | ICD-10-CM | POA: Insufficient documentation

## 2015-06-21 DIAGNOSIS — R569 Unspecified convulsions: Secondary | ICD-10-CM | POA: Diagnosis present

## 2015-06-21 DIAGNOSIS — Z791 Long term (current) use of non-steroidal anti-inflammatories (NSAID): Secondary | ICD-10-CM | POA: Diagnosis not present

## 2015-06-21 LAB — URINALYSIS, ROUTINE W REFLEX MICROSCOPIC
Glucose, UA: NEGATIVE mg/dL
KETONES UR: 15 mg/dL — AB
LEUKOCYTES UA: NEGATIVE
NITRITE: NEGATIVE
PROTEIN: NEGATIVE mg/dL
Specific Gravity, Urine: 1.034 — ABNORMAL HIGH (ref 1.005–1.030)
pH: 6 (ref 5.0–8.0)

## 2015-06-21 LAB — CBC WITH DIFFERENTIAL/PLATELET
BASOS ABS: 0 10*3/uL (ref 0.0–0.1)
BASOS PCT: 1 %
Eosinophils Absolute: 0 10*3/uL (ref 0.0–0.7)
Eosinophils Relative: 1 %
HCT: 40 % (ref 36.0–46.0)
Hemoglobin: 13.5 g/dL (ref 12.0–15.0)
Lymphocytes Relative: 40 %
Lymphs Abs: 2.6 10*3/uL (ref 0.7–4.0)
MCH: 31 pg (ref 26.0–34.0)
MCHC: 33.8 g/dL (ref 30.0–36.0)
MCV: 91.7 fL (ref 78.0–100.0)
MONO ABS: 0.6 10*3/uL (ref 0.1–1.0)
Monocytes Relative: 10 %
NEUTROS PCT: 48 %
Neutro Abs: 3.2 10*3/uL (ref 1.7–7.7)
PLATELETS: 209 10*3/uL (ref 150–400)
RBC: 4.36 MIL/uL (ref 3.87–5.11)
RDW: 14.1 % (ref 11.5–15.5)
WBC: 6.5 10*3/uL (ref 4.0–10.5)

## 2015-06-21 LAB — URINE MICROSCOPIC-ADD ON

## 2015-06-21 LAB — BASIC METABOLIC PANEL
ANION GAP: 10 (ref 5–15)
BUN: 9 mg/dL (ref 6–20)
CALCIUM: 9.2 mg/dL (ref 8.9–10.3)
CO2: 24 mmol/L (ref 22–32)
Chloride: 102 mmol/L (ref 101–111)
Creatinine, Ser: 0.73 mg/dL (ref 0.44–1.00)
Glucose, Bld: 96 mg/dL (ref 65–99)
Potassium: 3.6 mmol/L (ref 3.5–5.1)
Sodium: 136 mmol/L (ref 135–145)

## 2015-06-21 LAB — RAPID URINE DRUG SCREEN, HOSP PERFORMED
Amphetamines: POSITIVE — AB
BENZODIAZEPINES: NOT DETECTED
Barbiturates: POSITIVE — AB
COCAINE: POSITIVE — AB
Opiates: NOT DETECTED
Tetrahydrocannabinol: POSITIVE — AB

## 2015-06-21 LAB — VALPROIC ACID LEVEL: Valproic Acid Lvl: <10 ug/mL — ABNORMAL LOW (ref 50.0–100.0)

## 2015-06-21 MED ORDER — FENTANYL CITRATE (PF) 100 MCG/2ML IJ SOLN
50.0000 ug | Freq: Once | INTRAMUSCULAR | Status: AC
  Administered 2015-06-21: 50 ug via INTRAVENOUS
  Filled 2015-06-21: qty 2

## 2015-06-21 MED ORDER — SODIUM CHLORIDE 0.9 % IV SOLN
1000.0000 mg | Freq: Once | INTRAVENOUS | Status: AC
  Administered 2015-06-21: 1000 mg via INTRAVENOUS
  Filled 2015-06-21: qty 10

## 2015-06-21 MED ORDER — PROMETHAZINE HCL 25 MG/ML IJ SOLN
12.5000 mg | Freq: Once | INTRAMUSCULAR | Status: AC
  Administered 2015-06-21: 12.5 mg via INTRAVENOUS
  Filled 2015-06-21: qty 1

## 2015-06-21 MED ORDER — VALPROATE SODIUM 500 MG/5ML IV SOLN
2500.0000 mg | Freq: Once | INTRAVENOUS | Status: AC
  Administered 2015-06-21: 2500 mg via INTRAVENOUS
  Filled 2015-06-21: qty 25

## 2015-06-21 MED ORDER — OXYCODONE-ACETAMINOPHEN 5-325 MG PO TABS
2.0000 | ORAL_TABLET | Freq: Once | ORAL | Status: AC
  Administered 2015-06-21: 2 via ORAL
  Filled 2015-06-21: qty 2

## 2015-06-21 NOTE — ED Notes (Signed)
Pt agreeing for treatment

## 2015-06-21 NOTE — ED Provider Notes (Signed)
CSN: [id number]     Arrival date & time 06/21/15  1322 History   First MD Initiated Contact with Patient 06/21/15 1424     Chief Complaint  Patient presents with  . Seizures     (Consider location/radiation/quality/duration/timing/severity/associated sxs/prior Treatment) HPI Comments: Pt comes in with cc of seizures. She has hx of seizures on depakote and keppra, HIV on HAART and per patient ovarian CA and brain tumor - not on chemo. She was at the cancer center and had a seizure like episode. Per EMS report, pt had 5 episodes, the last one in their presence - each episode lasting 15 seconds. Pt denies any incontinence. She reports increased stress lately and having nausea, emesis. She is taking zofran. Currently, she has a headache. Denies any new numbness, tingling, weakness.  Pt upset about being in the [facility] ER. Seems like the RNs were able to diffuse the situation seems like - and she is OK with one of the RNs treating him.  Pt admits to not having taken her meds in 3 days - just hasnt picked it up from the pharmacy.    Patient is a 40 y.o. female presenting with seizures. The history is provided by the patient.  Seizures   Past Medical History  Diagnosis Date  . Seizure disorder, grand mal ([place])     dx 2005  . Seizures ([place])     due to head trauma as adult  . Asthma     as child  . Meningioma ([place])   . Gallstones     s/p cholecystectomy  . HTN (hypertension)   . GERD (gastroesophageal reflux disease)   . Allergic rhinitis   . Cigarette nicotine dependence with withdrawal   . DVT (deep venous thrombosis) ([place])   . Chronic back pain   . Sciatic pain   . Arthritis   . HIV (human immunodeficiency virus infection) [facility])    Past Surgical History  Procedure Laterality Date  . Brain surgery      2013 to remove a meningioma  . Cholecystectomy     Family History  Problem Relation Age of Onset  . Other Mother     varicose vein  . Asthma Mother   . High blood pressure  Mother   . Deep vein thrombosis Neg Hx   . Pulmonary embolism Neg Hx   . Cancer    . Diabetes    . High blood pressure    . Asthma    . Thyroid disease    . Cancer Father    Social History  Substance Use Topics  . Smoking status: Current Every Day Smoker -- 0.25 packs/day for 18 years    Types: Cigarettes  . Smokeless tobacco: Never Used  . Alcohol Use: No   OB History    Gravida Para Term Preterm AB TAB SAB Ectopic Multiple Living   3 1 1  2 2    1      Review of Systems  Constitutional: Positive for activity change.  Respiratory: Negative for shortness of breath.   Cardiovascular: Negative for chest pain.  Gastrointestinal: Positive for nausea and vomiting. Negative for abdominal pain.  Genitourinary: Negative for dysuria.  Musculoskeletal: Negative for neck pain.  Neurological: Positive for seizures. Negative for headaches.      Allergies  Ibuprofen; Clindamycin/lincomycin; Coconut flavor; Flagyl; Latex; Other; and Aspirin  Home Medications   Prior to Admission medications   Medication Sig Start Date End Date Taking? Authorizing Provider  [facility]) [phone]  MG TABS Take 1 tablet by mouth daily. 06/23/14   Truman Hayward, MD  albuterol (PROVENTIL HFA;VENTOLIN HFA) 108 (90 BASE) MCG/ACT inhaler Inhale 2 puffs into the lungs every 4 (four) hours as needed for wheezing or shortness of breath. 08/02/14   Britt Bottom, NP  albuterol (PROVENTIL) (2.5 MG/3ML) 0.083% nebulizer solution Take 2.5 mg by nebulization every 6 (six) hours as needed for wheezing or shortness of breath.    Historical Provider, MD  ALPRAZolam Duanne Moron) 1 MG tablet Take 2 tablets (2 mg total) by mouth 3 (three) times daily as needed for anxiety. 12/30/14   Nicole Pisciotta, PA-C  amphetamine-dextroamphetamine (ADDERALL XR) 30 MG 24 hr capsule Take 30 mg by mouth 2 (two) times daily.    Historical Provider, MD  beclomethasone (QVAR) 40 MCG/ACT inhaler Inhale 2 puffs  into the lungs 2 (two) times daily as needed (for shortness of breath).     Historical Provider, MD  benzocaine (HURRICAINE) 20 % oral spray Apply 1 application topically 3 (three) times daily as needed for pain.    Historical Provider, MD  benzonatate (TESSALON) 100 MG capsule Take 2 capsules (200 mg total) by mouth 2 (two) times daily as needed for cough. 03/30/15   Nona Dell, PA-C  cyclobenzaprine (FLEXERIL) 10 MG tablet Take 1 tablet (10 mg total) by mouth 2 (two) times daily as needed for muscle spasms. 02/01/13   Larence Penning, MD  diazepam (VALIUM) 10 MG tablet Take 10 mg by mouth every 6 (six) hours as needed for anxiety.    Historical Provider, MD  divalproex (DEPAKOTE) 500 MG DR tablet Take 2 tablets (1,000 mg total) by mouth 3 (three) times daily. 12/30/14   Nicole Pisciotta, PA-C  fluticasone (FLONASE) 50 MCG/ACT nasal spray Place 1 spray into the nose 2 (two) times daily.    Historical Provider, MD  folic acid (FOLVITE) 1 MG tablet Take 1 mg by mouth daily.    Historical Provider, MD  gabapentin (NEURONTIN) 400 MG capsule Take 800 mg by mouth 3 (three) times daily.     Historical Provider, MD  hydrochlorothiazide (HYDRODIURIL) 25 MG tablet Take 1 tablet (25 mg total) by mouth daily. 07/09/13   Harvie Heck, PA-C  HYDROcodone-acetaminophen (NORCO/VICODIN) 5-325 MG per tablet Take 1 tablet by mouth every 4 (four) hours as needed. 08/06/14   Kristen N Ward, DO  HYDROcodone-acetaminophen (NORCO/VICODIN) 5-325 MG per tablet Take 1-2 tablets by mouth every 6 (six) hours as needed. 10/29/14   Dahlia Bailiff, PA-C  insulin glargine (LANTUS) 100 UNIT/ML injection Inject 10 Units into the skin daily.    Historical Provider, MD  levETIRAcetam (KEPPRA) 500 MG tablet Take 2 tablets (1,000 mg total) by mouth 2 (two) times daily. 12/30/14   Nicole Pisciotta, PA-C  loratadine (CLARITIN) 10 MG tablet Take 10 mg by mouth daily.  03/03/14   Historical Provider, MD  Mefenamic Acid 250 MG CAPS Take 2  capsules (500 mg total) by mouth 3 (three) times daily. X 4 days 08/30/14   Mercedes Camprubi-Soms, PA-C  meloxicam (MOBIC) 15 MG tablet Take 1 tablet (15 mg total) by mouth daily. 12/07/14   Tiffany Carlota Raspberry, PA-C  methocarbamol (ROBAXIN) 500 MG tablet Take 1 tablet (500 mg total) by mouth 2 (two) times daily. 12/07/14   Tiffany Carlota Raspberry, PA-C  metroNIDAZOLE (METROGEL VAGINAL) 0.75 % vaginal gel Place 1 Applicatorful vaginally 2 (two) times daily. X 5 days 08/30/14   Mercedes Camprubi-Soms, PA-C  mirtazapine (REMERON) 15 MG tablet Take 20 mg  by mouth at bedtime.     Historical Provider, MD  omeprazole (PRILOSEC) 20 MG capsule Take 20 mg by mouth 2 (two) times daily.    Historical Provider, MD  ondansetron (ZOFRAN ODT) 4 MG disintegrating tablet Take 1 tablet (4 mg total) by mouth every 8 (eight) hours as needed for nausea or vomiting. 03/30/15   Nona Dell, PA-C  oxyCODONE-acetaminophen (PERCOCET) 10-325 MG per tablet Take 1 tablet by mouth every 4 (four) hours as needed for pain.    Historical Provider, MD  oxymetazoline (AFRIN NASAL SPRAY) 0.05 % nasal spray Place 1 spray into both nostrils 2 (two) times daily. 03/30/15   Nona Dell, PA-C  PARoxetine (PAXIL) 40 MG tablet Take 1 tablet (40 mg total) by mouth daily. 07/09/13   Lauren Parker, PA-C  Phenylephrine-DM-GG-APAP (MUCINEX FAST-MAX CONGEST COLD) 5-10-200-325 MG TABS Take 2 tablets by mouth 3 (three) times daily. 08/02/14   Britt Bottom, NP  promethazine (PHENERGAN) 6.25 MG/5ML syrup Take 12.5 mg by mouth every 6 (six) hours as needed for nausea or vomiting.    Historical Provider, MD  propranolol (INDERAL) 10 MG tablet Take 10 mg by mouth 2 (two) times daily.  01/28/14   Historical Provider, MD  sitaGLIPtin (JANUVIA) 50 MG tablet Take 50 mg by mouth daily.    Historical Provider, MD  sodium chloride (OCEAN) 0.65 % nasal spray Place 1 spray into the nose 2 (two) times daily as needed for congestion.     Historical  Provider, MD  triamcinolone ointment (KENALOG) 0.5 % Apply 1 application topically 2 (two) times daily.    Historical Provider, MD  ZOLMitriptan (ZOMIG) 2.5 MG tablet Take 1 tablet by mouth 4 (four) times daily. 03/25/15   Historical Provider, MD   BP 113/98 mmHg  Pulse 93  Resp 21  Wt 247 lb (112.038 kg)  SpO2 98%  LMP 06/14/2015 Physical Exam  Constitutional: She is oriented to person, place, and time. She appears well-developed.  HENT:  Head: Normocephalic and atraumatic.  Eyes: EOM are normal.  Neck: Normal range of motion. Neck supple.  Cardiovascular: Normal rate.   Pulmonary/Chest: Effort normal.  Abdominal: Bowel sounds are normal.  Neurological: She is alert and oriented to person, place, and time. No cranial nerve deficit. Coordination normal.  Skin: Skin is warm and dry.  Nursing note and vitals reviewed.   ED Course  Procedures (including critical care time) Labs Review Labs Reviewed  URINALYSIS, ROUTINE W REFLEX MICROSCOPIC (NOT AT [facility]) - Abnormal; Notable for the following:    APPearance CLOUDY (*)    Specific Gravity, Urine 1.034 (*)    Hgb urine dipstick LARGE (*)    Bilirubin Urine SMALL (*)    Ketones, ur 15 (*)    All other components within normal limits  URINE RAPID DRUG SCREEN, HOSP PERFORMED - Abnormal; Notable for the following:    Cocaine POSITIVE (*)    Amphetamines POSITIVE (*)    Tetrahydrocannabinol POSITIVE (*)    Barbiturates POSITIVE (*)    All other components within normal limits  VALPROIC ACID LEVEL - Abnormal; Notable for the following:    Valproic Acid Lvl <10 (*)    All other components within normal limits  URINE MICROSCOPIC-ADD ON - Abnormal; Notable for the following:    Squamous Epithelial / LPF 6-30 (*)    Bacteria, UA FEW (*)    All other components within normal limits  CBC WITH DIFFERENTIAL/PLATELET  BASIC METABOLIC PANEL    Imaging Review No  results found. I have personally reviewed and evaluated these images and  lab results as part of my medical decision-making.   EKG Interpretation None      MDM   Final diagnoses:  Seizure disorder ([place])    Pt comes in post multiple seizures, one of the witnessed by EMS. + non compliance with meds for the past few days. There is hx of HIV and ? Cancer/ brain tumor - so we will get CT head. Pt has non focal neuro exam and she is at baseline. Will load her with the meds she needs. Pt has scripts and med her meds are awaiting to be picked up at the pharmacy.  Pt upset about being at [facility]. We have discussed the workup and she is agreement with the plan.       Varney Biles, MD 06/23/15 1723

## 2015-06-21 NOTE — Progress Notes (Signed)
ED Cm went to request to borrow a chair from pt room for a visitor in hallway Pt agreed to allow CM to get a chair from room. Cm was leaving pt room when she requested to speak with a "social worker" CM inquired what Cm could tell SW pt needed and pt stated "I just need a Education officer, museum" CM left ED SW a voice message stating pt wanting to see a SW but would not share what was needed

## 2015-06-21 NOTE — ED Notes (Signed)
Pt refusing treatment at this time. EDP notified

## 2015-06-21 NOTE — ED Notes (Signed)
Per Pt. This RN is "not allowed back in her room".  Pt was uncooperative and would not participate in any assessment.

## 2015-06-21 NOTE — ED Notes (Signed)
Pt was out in the community today and experienced 4 seizures and then a fifth when EMS was on the scene that lasted about 15 seconds. Pt reportedly hit her head on the ground during this. Pt was experiencing a postictal phase after and now has c/o generalized pain. Pt has a hx of seizures as well as a brain tumor.

## 2015-06-21 NOTE — ED Notes (Signed)
ASSUMED CARE OF PT. AAOX3. PT IN NO APPARENT DISTRESS. AWAITING FURTHER ORDERS.

## 2015-06-21 NOTE — ED Notes (Addendum)
Went in pt's room after [employer] reports that pt does not want anything done because  She did  Not want to be here d/t a lawsuit she has with this ED.  As soon as this nurse went in the room, pt states "[facility], I don't want to be seen here.", pt made aware that we can't transport her to [facility] ED once she is here.  Pt became belligerent and started to curse and call this nurse names.  Asked pt to stop cursing because she does not know this nurse and that this nurse is just trying to help her.  Wanted to speak to a doctor, started she cannot speak to a doctor unless she is registered and triaged but would not let this nurse finish talking.  She also states that EMS has her phone.  Mali AD walked in as pt continues to curse at this nurse.

## 2015-06-21 NOTE — Discharge Instructions (Signed)
All the results in the ER are normal, labs and imaging. We are not sure what is causing your symptoms. The workup in the ER is not complete, and is limited to screening for life threatening and emergent conditions only, so please see a primary care doctor for further evaluation.  See the neurologist soon.   Epilepsy Epilepsy is a disorder in which a person has repeated seizures over time. A seizure is a release of abnormal electrical activity in the brain. Seizures can cause a change in attention, behavior, or the ability to remain awake and alert (altered mental status). Seizures often involve uncontrollable shaking (convulsions).  Most people with epilepsy lead normal lives. However, people with epilepsy are at an increased risk of falls, accidents, and injuries. Therefore, it is important to begin treatment right away. CAUSES  Epilepsy has many possible causes. Anything that disturbs the normal pattern of brain cell activity can lead to seizures. This may include:   Head injury.  Birth trauma.  High fever as a child.  Stroke.  Bleeding into or around the brain.  Certain drugs.  Prolonged low oxygen, such as what occurs after CPR efforts.  Abnormal brain development.  Certain illnesses, such as meningitis, encephalitis (brain infection), malaria, and other infections.  An imbalance of nerve signaling chemicals (neurotransmitters).  SIGNS AND SYMPTOMS  The symptoms of a seizure can vary greatly from one person to another. Right before a seizure, you may have a warning (aura) that a seizure is about to occur. An aura may include the following symptoms:  Fear or anxiety.  Nausea.  Feeling like the room is spinning (vertigo).  Vision changes, such as seeing flashing lights or spots. Common symptoms during a seizure include:  Abnormal sensations, such as an abnormal smell or a bitter taste in the mouth.   Sudden, general body stiffness.   Convulsions that involve  rhythmic jerking of the face, arm, or leg on one or both sides.   Sudden change in consciousness.   Appearing to be awake but not responding.   Appearing to be asleep but cannot be awakened.   Grimacing, chewing, lip smacking, drooling, tongue biting, or loss of bowel or bladder control. After a seizure, you may feel sleepy for a while. DIAGNOSIS  Your health care provider will ask about your symptoms and take a medical history. Descriptions from any witnesses to your seizures will be very helpful in the diagnosis. A physical exam, including a detailed neurological exam, is necessary. Various tests may be done, such as:   An electroencephalogram (EEG). This is a painless test of your brain waves. In this test, a diagram is created of your brain waves. These diagrams can be interpreted by a specialist.  An MRI of the brain.   A CT scan of the brain.   A spinal tap (lumbar puncture, LP).  Blood tests to check for signs of infection or abnormal blood chemistry. TREATMENT  There is no cure for epilepsy, but it is generally treatable. Once epilepsy is diagnosed, it is important to begin treatment as soon as possible. For most people with epilepsy, seizures can be controlled with medicines. The following may also be used:  A pacemaker for the brain (vagus nerve stimulator) can be used for people with seizures that are not well controlled by medicine.  Surgery on the brain. For some people, epilepsy eventually goes away. HOME CARE INSTRUCTIONS   Follow your health care provider's recommendations on driving and safety in normal activities.  Get enough rest. Lack of sleep can cause seizures.  Only take over-the-counter or prescription medicines as directed by your health care provider. Take any prescribed medicine exactly as directed.  Avoid any known triggers of your seizures.  Keep a seizure diary. Record what you recall about any seizure, especially any possible trigger.    Make sure the people you live and work with know that you are prone to seizures. They should receive instructions on how to help you. In general, a witness to a seizure should:   Cushion your head and body.   Turn you on your side.   Avoid unnecessarily restraining you.   Not place anything inside your mouth.   Call for emergency medical help if there is any question about what has occurred.   Follow up with your health care provider as directed. You may need regular blood tests to monitor the levels of your medicine.  SEEK MEDICAL CARE IF:   You develop signs of infection or other illness. This might increase the risk of a seizure.   You seem to be having more frequent seizures.   Your seizure pattern is changing.  SEEK IMMEDIATE MEDICAL CARE IF:   You have a seizure that does not stop after a few moments.   You have a seizure that causes any difficulty in breathing.   You have a seizure that results in a very severe headache.   You have a seizure that leaves you with the inability to speak or use a part of your body.    This information is not intended to replace advice given to you by your health care provider. Make sure you discuss any questions you have with your health care provider.   Document Released: 04/01/2005 Document Revised: 01/20/2013 Document Reviewed: 11/11/2012 Elsevier Interactive Patient Education [employer].

## 2015-06-21 NOTE — ED Notes (Signed)
Pt is refusing treatment at this time. Pt is angry that she was not transported to [facility] per her request to EMS. Pt has cussed and threatened staff. She is alert and oriented but says she feels tired and weak after her seizures.

## 2015-06-29 ENCOUNTER — Ambulatory Visit: Payer: Medicaid Other | Admitting: Neurology

## 2015-07-08 ENCOUNTER — Emergency Department (HOSPITAL_COMMUNITY)
Admission: EM | Admit: 2015-07-08 | Discharge: 2015-07-08 | Disposition: A | Discharge status: Home / Self Care | Payer: Medicaid Other | Attending: Emergency Medicine | Admitting: Emergency Medicine

## 2015-07-08 ENCOUNTER — Encounter (HOSPITAL_COMMUNITY): Payer: Self-pay | Admitting: Nurse Practitioner

## 2015-07-08 DIAGNOSIS — K219 Gastro-esophageal reflux disease without esophagitis: Secondary | ICD-10-CM | POA: Insufficient documentation

## 2015-07-08 DIAGNOSIS — Z86718 Personal history of other venous thrombosis and embolism: Secondary | ICD-10-CM | POA: Diagnosis not present

## 2015-07-08 DIAGNOSIS — Z7952 Long term (current) use of systemic steroids: Secondary | ICD-10-CM | POA: Diagnosis not present

## 2015-07-08 DIAGNOSIS — I10 Essential (primary) hypertension: Secondary | ICD-10-CM | POA: Diagnosis not present

## 2015-07-08 DIAGNOSIS — Z86011 Personal history of benign neoplasm of the brain: Secondary | ICD-10-CM | POA: Insufficient documentation

## 2015-07-08 DIAGNOSIS — J45909 Unspecified asthma, uncomplicated: Secondary | ICD-10-CM | POA: Insufficient documentation

## 2015-07-08 DIAGNOSIS — Z7951 Long term (current) use of inhaled steroids: Secondary | ICD-10-CM | POA: Insufficient documentation

## 2015-07-08 DIAGNOSIS — R112 Nausea with vomiting, unspecified: Secondary | ICD-10-CM

## 2015-07-08 DIAGNOSIS — Z21 Asymptomatic human immunodeficiency virus [HIV] infection status: Secondary | ICD-10-CM | POA: Insufficient documentation

## 2015-07-08 DIAGNOSIS — M1990 Unspecified osteoarthritis, unspecified site: Secondary | ICD-10-CM | POA: Diagnosis not present

## 2015-07-08 DIAGNOSIS — G8929 Other chronic pain: Secondary | ICD-10-CM | POA: Diagnosis not present

## 2015-07-08 DIAGNOSIS — Z792 Long term (current) use of antibiotics: Secondary | ICD-10-CM | POA: Insufficient documentation

## 2015-07-08 DIAGNOSIS — E119 Type 2 diabetes mellitus without complications: Secondary | ICD-10-CM | POA: Insufficient documentation

## 2015-07-08 DIAGNOSIS — Z791 Long term (current) use of non-steroidal anti-inflammatories (NSAID): Secondary | ICD-10-CM | POA: Diagnosis not present

## 2015-07-08 DIAGNOSIS — Z91040 Latex allergy status: Secondary | ICD-10-CM | POA: Insufficient documentation

## 2015-07-08 DIAGNOSIS — F17210 Nicotine dependence, cigarettes, uncomplicated: Secondary | ICD-10-CM | POA: Diagnosis not present

## 2015-07-08 DIAGNOSIS — Z794 Long term (current) use of insulin: Secondary | ICD-10-CM | POA: Insufficient documentation

## 2015-07-08 DIAGNOSIS — Z79899 Other long term (current) drug therapy: Secondary | ICD-10-CM | POA: Diagnosis not present

## 2015-07-08 DIAGNOSIS — G40909 Epilepsy, unspecified, not intractable, without status epilepticus: Secondary | ICD-10-CM | POA: Insufficient documentation

## 2015-07-08 LAB — URINALYSIS, ROUTINE W REFLEX MICROSCOPIC
Bilirubin Urine: NEGATIVE
Glucose, UA: NEGATIVE mg/dL
Hgb urine dipstick: NEGATIVE
KETONES UR: NEGATIVE mg/dL
Leukocytes, UA: NEGATIVE
Nitrite: NEGATIVE
PH: 7 (ref 5.0–8.0)
Protein, ur: NEGATIVE mg/dL
SPECIFIC GRAVITY, URINE: 1.027 (ref 1.005–1.030)

## 2015-07-08 LAB — COMPREHENSIVE METABOLIC PANEL
ALBUMIN: 4 g/dL (ref 3.5–5.0)
ALT: 14 U/L (ref 14–54)
AST: 19 U/L (ref 15–41)
Alkaline Phosphatase: 69 U/L (ref 38–126)
Anion gap: 9 (ref 5–15)
BILIRUBIN TOTAL: 0.6 mg/dL (ref 0.3–1.2)
BUN: 7 mg/dL (ref 6–20)
CHLORIDE: 106 mmol/L (ref 101–111)
CO2: 26 mmol/L (ref 22–32)
Calcium: 9.5 mg/dL (ref 8.9–10.3)
Creatinine, Ser: 1 mg/dL (ref 0.44–1.00)
GFR calc Af Amer: >60 mL/min (ref >60)
GFR calc non Af Amer: >60 mL/min (ref >60)
GLUCOSE: 88 mg/dL (ref 65–99)
POTASSIUM: 3.7 mmol/L (ref 3.5–5.1)
SODIUM: 141 mmol/L (ref 135–145)
Total Protein: 7.6 g/dL (ref 6.5–8.1)

## 2015-07-08 LAB — CBC
HEMATOCRIT: 41.1 % (ref 36.0–46.0)
Hemoglobin: 13.5 g/dL (ref 12.0–15.0)
MCH: 30.1 pg (ref 26.0–34.0)
MCHC: 32.8 g/dL (ref 30.0–36.0)
MCV: 91.5 fL (ref 78.0–100.0)
Platelets: 241 10*3/uL (ref 150–400)
RBC: 4.49 MIL/uL (ref 3.87–5.11)
RDW: 14 % (ref 11.5–15.5)
WBC: 5.9 10*3/uL (ref 4.0–10.5)

## 2015-07-08 LAB — CBG MONITORING, ED: GLUCOSE-CAPILLARY: 96 mg/dL (ref 65–99)

## 2015-07-08 LAB — I-STAT BETA HCG BLOOD, ED (MC, WL, AP ONLY): I-stat hCG, quantitative: <5 m[IU]/mL (ref <5)

## 2015-07-08 MED ORDER — SODIUM CHLORIDE 0.9 % IV BOLUS (SEPSIS)
1000.0000 mL | Freq: Once | INTRAVENOUS | Status: AC
  Administered 2015-07-08: 1000 mL via INTRAVENOUS

## 2015-07-08 MED ORDER — PROMETHAZINE HCL 25 MG/ML IJ SOLN
25.0000 mg | Freq: Once | INTRAMUSCULAR | Status: AC
  Administered 2015-07-08: 25 mg via INTRAMUSCULAR
  Filled 2015-07-08: qty 1

## 2015-07-08 MED ORDER — PROMETHAZINE HCL 6.25 MG/5ML PO SYRP: 12.5000 mg | ORAL_SOLUTION | Freq: Four times a day (QID) | ORAL | Status: DC | PRN

## 2015-07-08 MED ORDER — PROMETHAZINE HCL 25 MG/ML IJ SOLN
25.0000 mg | Freq: Once | INTRAMUSCULAR | Status: DC
  Filled 2015-07-08: qty 1

## 2015-07-08 MED ORDER — GLUCOSE BLOOD VI STRP: ORAL_STRIP | Status: DC

## 2015-07-08 NOTE — ED Provider Notes (Signed)
CSN: [id number]     Arrival date & time 07/08/15  1122 History   First MD Initiated Contact with Patient 07/08/15 1142     Chief Complaint  Patient presents with  . Emesis     (Consider location/radiation/quality/duration/timing/severity/associated sxs/prior Treatment) HPI   Pt with hx HIV, seizure disorder, diabetes, HTN, presents with N/V x 3.  States she takes a large number of pills and always has N/V if she does not take phenergan with them.  Takes pills TID so she cannot spread them out further.  She ran out of her phenergan and always has N/V when she does not take her phenergan.  Denies fevers, abdominal pain, bowel changes, urinary, or vaginal changes.    Past Medical History  Diagnosis Date  . Seizure disorder, grand mal ([place])     dx 2005  . Seizures ([place])     due to head trauma as adult  . Asthma     as child  . Meningioma ([place])   . Gallstones     s/p cholecystectomy  . HTN (hypertension)   . GERD (gastroesophageal reflux disease)   . Allergic rhinitis   . Cigarette nicotine dependence with withdrawal   . DVT (deep venous thrombosis) ([place])   . Chronic back pain   . Sciatic pain   . Arthritis   . HIV (human immunodeficiency virus infection) [facility])    Past Surgical History  Procedure Laterality Date  . Brain surgery      2013 to remove a meningioma  . Cholecystectomy     Family History  Problem Relation Age of Onset  . Other Mother     varicose vein  . Asthma Mother   . High blood pressure Mother   . Deep vein thrombosis Neg Hx   . Pulmonary embolism Neg Hx   . Cancer    . Diabetes    . High blood pressure    . Asthma    . Thyroid disease    . Cancer Father    Social History  Substance Use Topics  . Smoking status: Current Every Day Smoker -- 0.25 packs/day for 18 years    Types: Cigarettes  . Smokeless tobacco: Never Used  . Alcohol Use: No   OB History    Gravida Para Term Preterm AB TAB SAB Ectopic Multiple Living   3 1 1  2 2    1       Review of Systems  All other systems reviewed and are negative.     Allergies  Ibuprofen; Clindamycin/lincomycin; Coconut flavor; Flagyl; Latex; Other; and Aspirin  Home Medications   Prior to Admission medications   Medication Sig Start Date End Date Taking? Authorizing Provider  Abacavir-Dolutegravir-Lamivud (TRIUMEQ) 600-50-300 MG TABS Take 1 tablet by mouth daily. 06/23/14   Truman Hayward, MD  albuterol (PROVENTIL HFA;VENTOLIN HFA) 108 (90 BASE) MCG/ACT inhaler Inhale 2 puffs into the lungs every 4 (four) hours as needed for wheezing or shortness of breath. 08/02/14   Britt Bottom, NP  albuterol (PROVENTIL) (2.5 MG/3ML) 0.083% nebulizer solution Take 2.5 mg by nebulization every 6 (six) hours as needed for wheezing or shortness of breath.    Historical Provider, MD  ALPRAZolam Duanne Moron) 1 MG tablet Take 2 tablets (2 mg total) by mouth 3 (three) times daily as needed for anxiety. 12/30/14   Nicole Pisciotta, PA-C  amphetamine-dextroamphetamine (ADDERALL XR) 30 MG 24 hr capsule Take 30 mg by mouth 2 (two) times daily.    Historical Provider,  MD  beclomethasone (QVAR) 40 MCG/ACT inhaler Inhale 2 puffs into the lungs 2 (two) times daily as needed (for shortness of breath).     Historical Provider, MD  benzocaine (HURRICAINE) 20 % oral spray Apply 1 application topically 3 (three) times daily as needed for pain.    Historical Provider, MD  benzonatate (TESSALON) 100 MG capsule Take 2 capsules (200 mg total) by mouth 2 (two) times daily as needed for cough. 03/30/15   Nona Dell, PA-C  cyclobenzaprine (FLEXERIL) 10 MG tablet Take 1 tablet (10 mg total) by mouth 2 (two) times daily as needed for muscle spasms. 02/01/13   Larence Penning, MD  diazepam (VALIUM) 10 MG tablet Take 10 mg by mouth every 6 (six) hours as needed for anxiety.    Historical Provider, MD  divalproex (DEPAKOTE) 500 MG DR tablet Take 2 tablets (1,000 mg total) by mouth 3 (three) times daily. 12/30/14    Nicole Pisciotta, PA-C  fluticasone (FLONASE) 50 MCG/ACT nasal spray Place 1 spray into the nose 2 (two) times daily.    Historical Provider, MD  folic acid (FOLVITE) 1 MG tablet Take 1 mg by mouth daily.    Historical Provider, MD  gabapentin (NEURONTIN) 400 MG capsule Take 800 mg by mouth 3 (three) times daily.     Historical Provider, MD  hydrochlorothiazide (HYDRODIURIL) 25 MG tablet Take 1 tablet (25 mg total) by mouth daily. 07/09/13   Harvie Heck, PA-C  HYDROcodone-acetaminophen (NORCO/VICODIN) 5-325 MG per tablet Take 1 tablet by mouth every 4 (four) hours as needed. 08/06/14   Kristen N Ward, DO  HYDROcodone-acetaminophen (NORCO/VICODIN) 5-325 MG per tablet Take 1-2 tablets by mouth every 6 (six) hours as needed. 10/29/14   Dahlia Bailiff, PA-C  insulin glargine (LANTUS) 100 UNIT/ML injection Inject 10 Units into the skin daily.    Historical Provider, MD  levETIRAcetam (KEPPRA) 500 MG tablet Take 2 tablets (1,000 mg total) by mouth 2 (two) times daily. 12/30/14   Nicole Pisciotta, PA-C  loratadine (CLARITIN) 10 MG tablet Take 10 mg by mouth daily.  03/03/14   Historical Provider, MD  Mefenamic Acid 250 MG CAPS Take 2 capsules (500 mg total) by mouth 3 (three) times daily. X 4 days 08/30/14   Mercedes Camprubi-Soms, PA-C  meloxicam (MOBIC) 15 MG tablet Take 1 tablet (15 mg total) by mouth daily. 12/07/14   Tiffany Carlota Raspberry, PA-C  methocarbamol (ROBAXIN) 500 MG tablet Take 1 tablet (500 mg total) by mouth 2 (two) times daily. 12/07/14   Tiffany Carlota Raspberry, PA-C  metroNIDAZOLE (METROGEL VAGINAL) 0.75 % vaginal gel Place 1 Applicatorful vaginally 2 (two) times daily. X 5 days 08/30/14   Mercedes Camprubi-Soms, PA-C  mirtazapine (REMERON) 15 MG tablet Take 20 mg by mouth at bedtime.     Historical Provider, MD  omeprazole (PRILOSEC) 20 MG capsule Take 20 mg by mouth 2 (two) times daily.    Historical Provider, MD  ondansetron (ZOFRAN ODT) 4 MG disintegrating tablet Take 1 tablet (4 mg total) by mouth every 8  (eight) hours as needed for nausea or vomiting. 03/30/15   Nona Dell, PA-C  oxyCODONE-acetaminophen (PERCOCET) 10-325 MG per tablet Take 1 tablet by mouth every 4 (four) hours as needed for pain.    Historical Provider, MD  oxymetazoline (AFRIN NASAL SPRAY) 0.05 % nasal spray Place 1 spray into both nostrils 2 (two) times daily. 03/30/15   Nona Dell, PA-C  PARoxetine (PAXIL) 40 MG tablet Take 1 tablet (40 mg total) by mouth daily.  07/09/13   Lauren Parker, PA-C  Phenylephrine-DM-GG-APAP (MUCINEX FAST-MAX CONGEST COLD) 5-10-200-325 MG TABS Take 2 tablets by mouth 3 (three) times daily. 08/02/14   Britt Bottom, NP  promethazine (PHENERGAN) 6.25 MG/5ML syrup Take 12.5 mg by mouth every 6 (six) hours as needed for nausea or vomiting.    Historical Provider, MD  propranolol (INDERAL) 10 MG tablet Take 10 mg by mouth 2 (two) times daily.  01/28/14   Historical Provider, MD  sitaGLIPtin (JANUVIA) 50 MG tablet Take 50 mg by mouth daily.    Historical Provider, MD  sodium chloride (OCEAN) 0.65 % nasal spray Place 1 spray into the nose 2 (two) times daily as needed for congestion.     Historical Provider, MD  triamcinolone ointment (KENALOG) 0.5 % Apply 1 application topically 2 (two) times daily.    Historical Provider, MD  ZOLMitriptan (ZOMIG) 2.5 MG tablet Take 1 tablet by mouth 4 (four) times daily. 03/25/15   Historical Provider, MD   BP 120/77 mmHg  Pulse 88  Temp(Src) 98 F (36.7 C) (Oral)  Resp 16  Ht 5\' 5"  (1.651 m)  Wt 107.14 kg  BMI 39.31 kg/m2  SpO2 99%  LMP 06/14/2015 Physical Exam  Constitutional: She appears well-developed and well-nourished. No distress.  HENT:  Head: Normocephalic and atraumatic.  Neck: Neck supple.  Cardiovascular: Normal rate and regular rhythm.   Pulmonary/Chest: Effort normal and breath sounds normal. No respiratory distress. She has no wheezes. She has no rales.  Abdominal: Soft. She exhibits no distension. There is no  tenderness. There is no rebound and no guarding.  Neurological: She is alert.  Skin: She is not diaphoretic.  Nursing note and vitals reviewed.   ED Course  Procedures (including critical care time) Labs Review Labs Reviewed  URINALYSIS, ROUTINE W REFLEX MICROSCOPIC (NOT AT [facility]) - Abnormal; Notable for the following:    APPearance HAZY (*)    All other components within normal limits  COMPREHENSIVE METABOLIC PANEL  CBC  I-STAT BETA HCG BLOOD, ED ([facility], [facility], [facility] ONLY)  CBG MONITORING, ED    Imaging Review No results found. I have personally reviewed and evaluated these images and lab results as part of my medical decision-making.   EKG Interpretation None      MDM   Final diagnoses:  Nausea and vomiting, vomiting of unspecified type    Afebrile, nontoxic patient with N/V since she ran out of her phenergan.     D/C home with phenergan, test strips per request.  She has an appt with PCP next week.  No abdominal pain, no other symptoms or concerns at this time.  Discussed result, findings, treatment, and follow up  with patient.  Pt given return precautions.  Pt verbalizes understanding and agrees with plan.         Clayton Bibles, PA-C 07/08/15 [place], MD 07/09/15 [phone]

## 2015-07-08 NOTE — Discharge Instructions (Signed)
Read the information below.  Use the prescribed medication as directed.  Please discuss all new medications with your pharmacist.  You may return to the Emergency Department at any time for worsening condition or any new symptoms that concern you.  If you develop high fevers, abdominal pain, uncontrolled vomiting, or are unable to tolerate fluids by mouth, return to the ER for a recheck.    Nausea and Vomiting Nausea is a sick feeling that often comes before throwing up (vomiting). Vomiting is a reflex where stomach contents come out of your mouth. Vomiting can cause severe loss of body fluids (dehydration). Children and elderly adults can become dehydrated quickly, especially if they also have diarrhea. Nausea and vomiting are symptoms of a condition or disease. It is important to find the cause of your symptoms. CAUSES   Direct irritation of the stomach lining. This irritation can result from increased acid production (gastroesophageal reflux disease), infection, food poisoning, taking certain medicines (such as nonsteroidal anti-inflammatory drugs), alcohol use, or tobacco use.  Signals from the brain.These signals could be caused by a headache, heat exposure, an inner ear disturbance, increased pressure in the brain from injury, infection, a tumor, or a concussion, pain, emotional stimulus, or metabolic problems.  An obstruction in the gastrointestinal tract (bowel obstruction).  Illnesses such as diabetes, hepatitis, gallbladder problems, appendicitis, kidney problems, cancer, sepsis, atypical symptoms of a heart attack, or eating disorders.  Medical treatments such as chemotherapy and radiation.  Receiving medicine that makes you sleep (general anesthetic) during surgery. DIAGNOSIS Your caregiver may ask for tests to be done if the problems do not improve after a few days. Tests may also be done if symptoms are severe or if the reason for the nausea and vomiting is not clear. Tests may  include:  Urine tests.  Blood tests.  Stool tests.  Cultures (to look for evidence of infection).  X-rays or other imaging studies. Test results can help your caregiver make decisions about treatment or the need for additional tests. TREATMENT You need to stay well hydrated. Drink frequently but in small amounts.You may wish to drink water, sports drinks, clear broth, or eat frozen ice pops or gelatin dessert to help stay hydrated.When you eat, eating slowly may help prevent nausea.There are also some antinausea medicines that may help prevent nausea. HOME CARE INSTRUCTIONS   Take all medicine as directed by your caregiver.  If you do not have an appetite, do not force yourself to eat. However, you must continue to drink fluids.  If you have an appetite, eat a normal diet unless your caregiver tells you differently.  Eat a variety of complex carbohydrates (rice, wheat, potatoes, bread), lean meats, yogurt, fruits, and vegetables.  Avoid high-fat foods because they are more difficult to digest.  Drink enough water and fluids to keep your urine clear or pale yellow.  If you are dehydrated, ask your caregiver for specific rehydration instructions. Signs of dehydration may include:  Severe thirst.  Dry lips and mouth.  Dizziness.  Dark urine.  Decreasing urine frequency and amount.  Confusion.  Rapid breathing or pulse. SEEK IMMEDIATE MEDICAL CARE IF:   You have blood or brown flecks (like coffee grounds) in your vomit.  You have black or bloody stools.  You have a severe headache or stiff neck.  You are confused.  You have severe abdominal pain.  You have chest pain or trouble breathing.  You do not urinate at least once every 8 hours.  You develop  cold or clammy skin.  You continue to vomit for longer than 24 to 48 hours.  You have a fever. MAKE SURE YOU:   Understand these instructions.  Will watch your condition.  Will get help right away if  you are not doing well or get worse.   This information is not intended to replace advice given to you by your health care provider. Make sure you discuss any questions you have with your health care provider.   Document Released: 04/01/2005 Document Revised: 06/24/2011 Document Reviewed: 08/29/2010 Elsevier Interactive Patient Education [employer].

## 2015-07-08 NOTE — ED Notes (Signed)
She c/o 2 day history of n/v and hypeprglycemia. States she has chronic nausea and has run out of her phenergan and has been unable to tolerate any medications since. She denies pain

## 2015-08-16 ENCOUNTER — Emergency Department (HOSPITAL_COMMUNITY)
Admission: EM | Admit: 2015-08-16 | Discharge: 2015-08-16 | Disposition: A | Discharge status: Home / Self Care | Payer: Medicaid Other | Attending: Emergency Medicine | Admitting: Emergency Medicine

## 2015-08-16 ENCOUNTER — Ambulatory Visit: Payer: Medicaid Other | Admitting: Neurology

## 2015-08-16 DIAGNOSIS — J45909 Unspecified asthma, uncomplicated: Secondary | ICD-10-CM | POA: Diagnosis not present

## 2015-08-16 DIAGNOSIS — J069 Acute upper respiratory infection, unspecified: Secondary | ICD-10-CM | POA: Insufficient documentation

## 2015-08-16 DIAGNOSIS — B20 Human immunodeficiency virus [HIV] disease: Secondary | ICD-10-CM | POA: Diagnosis not present

## 2015-08-16 DIAGNOSIS — G8929 Other chronic pain: Secondary | ICD-10-CM | POA: Diagnosis not present

## 2015-08-16 DIAGNOSIS — F17210 Nicotine dependence, cigarettes, uncomplicated: Secondary | ICD-10-CM | POA: Diagnosis not present

## 2015-08-16 DIAGNOSIS — I10 Essential (primary) hypertension: Secondary | ICD-10-CM | POA: Diagnosis not present

## 2015-08-16 DIAGNOSIS — F410 Panic disorder [episodic paroxysmal anxiety] without agoraphobia: Secondary | ICD-10-CM | POA: Diagnosis present

## 2015-08-16 NOTE — ED Notes (Signed)
Pt called for E42 but no answer. Pt moved off the floor.

## 2015-08-16 NOTE — ED Notes (Signed)
PT CALLED IN WAITING ROOM TO GO TO A01 BUT NO ANSWER. PT PLACED BACK IN WAITING ROOM.

## 2015-08-16 NOTE — ED Notes (Signed)
Pt c/o bad panic and anxiety attacks, pt c/o cold symptoms and coughing. Pt hx of anxiety. Pt has been out of her medication (xanax) for a week, has appt with PCP on the 16th. Pt in NAD at this time. VSS.

## 2015-09-06 ENCOUNTER — Ambulatory Visit (INDEPENDENT_AMBULATORY_CARE_PROVIDER_SITE_OTHER): Payer: Medicaid Other | Admitting: Neurology

## 2015-09-06 ENCOUNTER — Encounter: Payer: Self-pay | Admitting: Neurology

## 2015-09-06 ENCOUNTER — Other Ambulatory Visit: Payer: Medicaid Other

## 2015-09-06 VITALS — BP 122/68 | HR 91 | Ht 65.0 in | Wt 234.0 lb

## 2015-09-06 DIAGNOSIS — G40219 Localization-related (focal) (partial) symptomatic epilepsy and epileptic syndromes with complex partial seizures, intractable, without status epilepticus: Secondary | ICD-10-CM

## 2015-09-06 DIAGNOSIS — Z9889 Other specified postprocedural states: Secondary | ICD-10-CM

## 2015-09-06 DIAGNOSIS — Z86018 Personal history of other benign neoplasm: Secondary | ICD-10-CM | POA: Diagnosis not present

## 2015-09-06 NOTE — Progress Notes (Signed)
Chart forwarded.  
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
## 2015-09-06 NOTE — Patient Instructions (Addendum)
1.  Continue Keppra 1000mg  twice daily and Depakote 1000mg  three times daily. 2.  Since you continue to have seizures, we will add another seizure medication called lamotrigine:  We will use 25mg  tablets.   Take 1 tablet every other day for 2 weeks  Then 1 tablet daily for 2 weeks  Then 1 tablet twice daily for 1 week  Then 2 tablets twice daily  A week later (6 weeks from today), I want you to follow up with me. Please see information regarding this medication below. 3.  We will also check depakote and Keppra levels. 4.  STOP MIRTAZAPINE  Epilepsy People with epilepsy have times when they shake and jerk uncontrollably (seizures). This happens when there is a sudden change in brain function. Epilepsy may have many possible causes. Anything that disturbs the normal pattern of brain cell activity can lead to seizures. HOME CARE   Follow your doctor's instructions about driving and safety during normal activities.  Get enough sleep.  Only take medicine as told by your doctor.  Avoid things that you know can cause you to have seizures (triggers).  Write down when your seizures happen and what you remember about each seizure. Write down anything you think may have caused the seizure to happen.  Tell the people you live and work with that you have seizures. Make sure they know how to help you. They should:  Cushion your head and body.  Turn you on your side.  Not restrain you.  Not place anything inside your mouth.  Call for local emergency medical help if there is any question about what has happened.  Keep all follow-up visits with your doctor. This is very important. GET HELP IF:  You get an infection or start to feel sick. You may have more seizures when you are sick.  You are having seizures more often.  Your seizure pattern is changing. GET HELP RIGHT AWAY IF:   A seizure does not stop after a few seconds or minutes.  A seizure causes you to have trouble  breathing.  A seizure gives you a very bad headache.  A seizure makes you unable to speak or use a part of your body.   This information is not intended to replace advice given to you by your health care provider. Make sure you discuss any questions you have with your health care provider.   Document Released: 01/27/2009 Document Revised: 01/20/2013 Document Reviewed: 11/11/2012 Elsevier Interactive Patient Education 2016 [employer].  Lamotrigine tablets What is this medicine? LAMOTRIGINE (la MOE Hendricks Limes) is used to control seizures in adults and children with epilepsy and Lennox-Gastaut syndrome. It is also used in adults to treat bipolar disorder. This medicine may be used for other purposes; ask your health care provider or pharmacist if you have questions. What should I tell my health care provider before I take this medicine? They need to know if you have any of these conditions: -a history of depression or bipolar disorder -aseptic meningitis during prior use of lamotrigine -folate deficiency -kidney disease -liver disease -suicidal thoughts, plans, or attempt; a previous suicide attempt by you or a family member -an unusual or allergic reaction to lamotrigine or other seizure medications, other medicines, foods, dyes, or preservatives -pregnant or trying to get pregnant -breast-feeding How should I use this medicine? Take this medicine by mouth with a glass of water. Follow the directions on the prescription label. Do not chew these tablets. If this medicine upsets your stomach,  take it with food or milk. Take your doses at regular intervals. Do not take your medicine more often than directed. A special MedGuide will be given to you by the pharmacist with each new prescription and refill. Be sure to read this information carefully each time. Talk to your pediatrician regarding the use of this medicine in children. While this drug may be prescribed for children as young as 2  years for selected conditions, precautions do apply. Overdosage: If you think you have taken too much of this medicine contact a poison control center or emergency room at once. NOTE: This medicine is only for you. Do not share this medicine with others. What if I miss a dose? If you miss a dose, take it as soon as you can. If it is almost time for your next dose, take only that dose. Do not take double or extra doses. What may interact with this medicine? -carbamazepine -female hormones, including contraceptive or birth control pills -methotrexate -phenobarbital -phenytoin -primidone -pyrimethamine -rifampin -trimethoprim -valproic acid This list may not describe all possible interactions. Give your health care provider a list of all the medicines, herbs, non-prescription drugs, or dietary supplements you use. Also tell them if you smoke, drink alcohol, or use illegal drugs. Some items may interact with your medicine. What should I watch for while using this medicine? Visit your doctor or health care professional for regular checks on your progress. If you take this medicine for seizures, wear a Medic Alert bracelet or necklace. Carry an identification card with information about your condition, medicines, and doctor or health care professional. It is important to take this medicine exactly as directed. When first starting treatment, your dose will need to be adjusted slowly. It may take weeks or months before your dose is stable. You should contact your doctor or health care professional if your seizures get worse or if you have any new types of seizures. Do not stop taking this medicine unless instructed by your doctor or health care professional. Stopping your medicine suddenly can increase your seizures or their severity. Contact your doctor or health care professional right away if you develop a rash while taking this medicine. Rashes may be very severe and sometimes require treatment in  the hospital. Deaths from rashes have occurred. Serious rashes occur more often in children than adults taking this medicine. It is more common for these serious rashes to occur during the first 2 months of treatment, but a rash can occur at any time. You may get drowsy, dizzy, or have blurred vision. Do not drive, use machinery, or do anything that needs mental alertness until you know how this medicine affects you. To reduce dizzy or fainting spells, do not sit or stand up quickly, especially if you are an older patient. Alcohol can increase drowsiness and dizziness. Avoid alcoholic drinks. If you are taking this medicine for bipolar disorder, it is important to report any changes in your mood to your doctor or health care professional. If your condition gets worse, you get mentally depressed, feel very hyperactive or manic, have difficulty sleeping, or have thoughts of hurting yourself or committing suicide, you need to get help from your health care professional right away. If you are a caregiver for someone taking this medicine for bipolar disorder, you should also report these behavioral changes right away. The use of this medicine may increase the chance of suicidal thoughts or actions. Pay special attention to how you are responding while on this medicine.  Your mouth may get dry. Chewing sugarless gum or sucking hard candy, and drinking plenty of water may help. Contact your doctor if the problem does not go away or is severe. Women who become pregnant while using this medicine may enroll in the [place] Pregnancy Registry by calling [phone]. This registry collects information about the safety of antiepileptic drug use during pregnancy. What side effects may I notice from receiving this medicine? Side effects you should report to your doctor or health care professional as soon as possible: -allergic reactions like skin rash, itching or hives, swelling of the face, lips,  or tongue -blurred or double vision -difficulty walking or controlling muscle movements -fever -headache, stiff neck, and sensitivity to light -painful sores in the mouth, eyes, or nose -redness, blistering, peeling or loosening of the skin, including inside the mouth -severe muscle pain -swollen lymph glands -uncontrollable eye movements -unusual bruising or bleeding -unusually weak or tired -vomiting -worsening of mood, thoughts or actions of suicide or dying -yellowing of the eyes or skin Side effects that usually do not require medical attention (report to your doctor or health care professional if they continue or are bothersome): -diarrhea or constipation -difficulty sleeping -nausea -tremors This list may not describe all possible side effects. Call your doctor for medical advice about side effects. You may report side effects to FDA at 1-800-FDA-1088. Where should I keep my medicine? Keep out of reach of children. Store at room temperature between 15 and 30 degrees C (59 and 86 degrees F). Throw away any unused medicine after the expiration date. NOTE: This sheet is a summary. It may not cover all possible information. If you have questions about this medicine, talk to your doctor, pharmacist, or health care provider.    2016, Elsevier/Gold Standard. (2010-04-04 12:21:39)

## 2015-09-07 LAB — VALPROIC ACID LEVEL: VALPROIC ACID LVL: 16 ug/mL — AB (ref 50.0–100.0)

## 2015-09-10 LAB — LEVETIRACETAM LEVEL: KEPPRA (LEVETIRACETAM): 1.8 ug/mL

## 2015-09-13 ENCOUNTER — Telehealth: Payer: Self-pay

## 2015-09-13 DIAGNOSIS — Z79899 Other long term (current) drug therapy: Secondary | ICD-10-CM

## 2015-09-13 MED ORDER — DIVALPROEX SODIUM ER 500 MG PO TB24: 3000.0000 mg | ORAL_TABLET | Freq: Every day | ORAL | Status: DC

## 2015-09-13 NOTE — Telephone Encounter (Signed)
-----   Message from Pieter Partridge, DO sent at 09/13/2015  7:04 AM EDT ----- Labs suggest that she is not taking the Depakote as prescribed.  I would like to switch the Depakote to Depakote ER 3000mg  once daily and have her come to the office everyday for 5 days in order for [place] to make sure she is taking the medication correctly and we can then recheck her level (we can start on Monday).

## 2015-09-13 NOTE — Telephone Encounter (Signed)
Message left on pt's vm. Medication sent in. Will set reminder for repeat lab work in 5 days.

## 2015-09-18 ENCOUNTER — Telehealth: Payer: Self-pay

## 2015-09-18 NOTE — Telephone Encounter (Signed)
-----   Message from Amada Kingfisher, [place] sent at 09/13/2015  9:46 AM EDT ----- Depakote Level

## 2015-09-18 NOTE — Telephone Encounter (Signed)
Attempted to reach pt. No answer. Left vm

## 2015-09-19 ENCOUNTER — Other Ambulatory Visit: Payer: Medicaid Other

## 2015-09-19 DIAGNOSIS — Z79899 Other long term (current) drug therapy: Secondary | ICD-10-CM

## 2015-09-19 NOTE — Telephone Encounter (Signed)
Pt will come in today to have labs drawn.

## 2015-09-21 LAB — VALPROIC ACID LEVEL: Valproic Acid Lvl: 36 ug/mL — ABNORMAL LOW (ref 50–100)

## 2015-10-03 ENCOUNTER — Telehealth: Payer: Self-pay | Admitting: Neurology

## 2015-10-03 NOTE — Telephone Encounter (Signed)
Lori Ford [birth year]. She called needing to see if you can get her medical records to [facility] ([place]) [phone]. Thank you

## 2015-10-03 NOTE — Telephone Encounter (Signed)
Called and verified request with patient. OV note printed and faxed.

## 2015-10-23 ENCOUNTER — Ambulatory Visit: Payer: Medicaid Other | Admitting: Neurology

## 2015-10-24 ENCOUNTER — Encounter: Payer: Self-pay | Admitting: Obstetrics and Gynecology

## 2015-10-24 ENCOUNTER — Encounter: Payer: Medicaid Other | Admitting: Obstetrics and Gynecology

## 2015-10-24 NOTE — Progress Notes (Signed)
GYN Note  Patient no show'ed to new patient visit on 10/24/15 @ 0800  Aletha Halim, Brooke Bonito MD Attending Center for [facility])

## 2015-10-24 NOTE — Progress Notes (Signed)
GYN Note  Patient No Show'ed to 7/11 @ 0800 appointment  Durene Romans MD Attending Center for [facility])

## 2015-10-26 ENCOUNTER — Ambulatory Visit: Payer: Medicaid Other | Admitting: Endocrinology

## 2015-10-26 DIAGNOSIS — Z0289 Encounter for other administrative examinations: Secondary | ICD-10-CM

## 2015-10-29 DIAGNOSIS — Z95828 Presence of other vascular implants and grafts: Secondary | ICD-10-CM
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
## 2015-10-31 ENCOUNTER — Encounter (HOSPITAL_COMMUNITY)
Admission: RE | Disposition: A | Discharge status: Home / Self Care | Payer: Self-pay | Source: Ambulatory Visit | Attending: Cardiology

## 2015-10-31 ENCOUNTER — Encounter (HOSPITAL_COMMUNITY): Payer: Self-pay | Admitting: Cardiology

## 2015-10-31 ENCOUNTER — Ambulatory Visit (HOSPITAL_COMMUNITY)
Admission: RE | Admit: 2015-10-31 | Discharge: 2015-10-31 | Disposition: A | Discharge status: Home / Self Care | Payer: Medicaid Other | Source: Ambulatory Visit | Attending: Cardiology | Admitting: Cardiology

## 2015-10-31 DIAGNOSIS — Z86718 Personal history of other venous thrombosis and embolism: Secondary | ICD-10-CM | POA: Diagnosis not present

## 2015-10-31 DIAGNOSIS — I10 Essential (primary) hypertension: Secondary | ICD-10-CM | POA: Diagnosis not present

## 2015-10-31 DIAGNOSIS — F419 Anxiety disorder, unspecified: Secondary | ICD-10-CM | POA: Insufficient documentation

## 2015-10-31 DIAGNOSIS — F17210 Nicotine dependence, cigarettes, uncomplicated: Secondary | ICD-10-CM | POA: Insufficient documentation

## 2015-10-31 DIAGNOSIS — E1143 Type 2 diabetes mellitus with diabetic autonomic (poly)neuropathy: Secondary | ICD-10-CM | POA: Insufficient documentation

## 2015-10-31 DIAGNOSIS — Z4689 Encounter for fitting and adjustment of other specified devices: Secondary | ICD-10-CM | POA: Diagnosis not present

## 2015-10-31 DIAGNOSIS — R0789 Other chest pain: Secondary | ICD-10-CM | POA: Diagnosis not present

## 2015-10-31 DIAGNOSIS — E1165 Type 2 diabetes mellitus with hyperglycemia: Secondary | ICD-10-CM | POA: Diagnosis not present

## 2015-10-31 DIAGNOSIS — Z87820 Personal history of traumatic brain injury: Secondary | ICD-10-CM | POA: Diagnosis not present

## 2015-10-31 DIAGNOSIS — Z794 Long term (current) use of insulin: Secondary | ICD-10-CM | POA: Insufficient documentation

## 2015-10-31 DIAGNOSIS — Z8249 Family history of ischemic heart disease and other diseases of the circulatory system: Secondary | ICD-10-CM | POA: Insufficient documentation

## 2015-10-31 DIAGNOSIS — G40909 Epilepsy, unspecified, not intractable, without status epilepticus: Secondary | ICD-10-CM | POA: Diagnosis not present

## 2015-10-31 DIAGNOSIS — Z7951 Long term (current) use of inhaled steroids: Secondary | ICD-10-CM | POA: Diagnosis not present

## 2015-10-31 DIAGNOSIS — Z95828 Presence of other vascular implants and grafts: Secondary | ICD-10-CM

## 2015-10-31 HISTORY — PX: IVC FILTER REMOVAL: CATH118246

## 2015-10-31 HISTORY — PX: PERIPHERAL VASCULAR CATHETERIZATION: SHX172C

## 2015-10-31 LAB — HCG, SERUM, QUALITATIVE: Preg, Serum: NEGATIVE

## 2015-10-31 LAB — GLUCOSE, CAPILLARY
Glucose-Capillary: 84 mg/dL (ref 65–99)
Glucose-Capillary: 79 mg/dL (ref 65–99)

## 2015-10-31 SURGERY — IVC FILTER REMOVAL
Anesthesia: LOCAL

## 2015-10-31 MED ORDER — SODIUM CHLORIDE 0.9 % IV SOLN: 250.0000 mL | INTRAVENOUS | Status: DC | PRN

## 2015-10-31 MED ORDER — HYDROMORPHONE HCL 1 MG/ML IJ SOLN
INTRAMUSCULAR | Status: DC | PRN
  Administered 2015-10-31 (×3): 0.5 mg via INTRAVENOUS

## 2015-10-31 MED ORDER — LIDOCAINE HCL (PF) 1 % IJ SOLN
INTRAMUSCULAR | Status: AC
Start: 2015-10-31 — End: 2015-10-31
  Filled 2015-10-31: qty 30

## 2015-10-31 MED ORDER — MIDAZOLAM HCL 2 MG/2ML IJ SOLN
INTRAMUSCULAR | Status: AC
  Filled 2015-10-31: qty 2

## 2015-10-31 MED ORDER — SODIUM CHLORIDE 0.9% FLUSH: 3.0000 mL | INTRAVENOUS | Status: DC | PRN

## 2015-10-31 MED ORDER — SODIUM CHLORIDE 0.9 % IV SOLN
INTRAVENOUS | Status: DC
  Administered 2015-10-31: 09:00:00 via INTRAVENOUS

## 2015-10-31 MED ORDER — HYDROMORPHONE HCL 1 MG/ML IJ SOLN
INTRAMUSCULAR | Status: AC
  Filled 2015-10-31: qty 1

## 2015-10-31 MED ORDER — SODIUM CHLORIDE 0.9% FLUSH: 3.0000 mL | Freq: Two times a day (BID) | INTRAVENOUS | Status: DC

## 2015-10-31 MED ORDER — MIDAZOLAM HCL 2 MG/2ML IJ SOLN
INTRAMUSCULAR | Status: AC
Start: 2015-10-31 — End: 2015-10-31
  Filled 2015-10-31: qty 2

## 2015-10-31 MED ORDER — IODIXANOL 320 MG/ML IV SOLN
INTRAVENOUS | Status: DC | PRN
  Administered 2015-10-31: 50 mL via INTRAVENOUS

## 2015-10-31 MED ORDER — OXYCODONE-ACETAMINOPHEN 5-325 MG PO TABS
ORAL_TABLET | ORAL | Status: AC
  Filled 2015-10-31: qty 2

## 2015-10-31 MED ORDER — OXYCODONE-ACETAMINOPHEN 10-325 MG PO TABS: 1.0000 | ORAL_TABLET | ORAL | Status: DC | PRN

## 2015-10-31 MED ORDER — OXYCODONE-ACETAMINOPHEN 5-325 MG PO TABS
1.0000 | ORAL_TABLET | ORAL | Status: DC | PRN
  Administered 2015-10-31: 2 via ORAL

## 2015-10-31 MED ORDER — MIDAZOLAM HCL 2 MG/2ML IJ SOLN
INTRAMUSCULAR | Status: DC | PRN
  Administered 2015-10-31: 1 mg via INTRAVENOUS
  Administered 2015-10-31: 2 mg via INTRAVENOUS
  Administered 2015-10-31: 1 mg via INTRAVENOUS

## 2015-10-31 MED ORDER — FENTANYL CITRATE (PF) 100 MCG/2ML IJ SOLN: INTRAMUSCULAR | Status: DC | PRN

## 2015-10-31 MED ORDER — LIDOCAINE HCL (PF) 1 % IJ SOLN
INTRAMUSCULAR | Status: DC | PRN
  Administered 2015-10-31: 10 mL via SUBCUTANEOUS
  Administered 2015-10-31: 30 mL via SUBCUTANEOUS

## 2015-10-31 MED ORDER — HEPARIN (PORCINE) IN NACL 2-0.9 UNIT/ML-% IJ SOLN
INTRAMUSCULAR | Status: DC | PRN
  Administered 2015-10-31: 11:00:00

## 2015-10-31 SURGICAL SUPPLY — 10 items
COVER DOME SNAP 22 D (MISCELLANEOUS) ×3 IMPLANT
COVER PRB 48X5XTLSCP FOLD TPE (BAG) ×2 IMPLANT
COVER PROBE 5X48 (BAG) ×1
KIT MICROINTRODUCER STIFF 5F (SHEATH) ×3 IMPLANT
PROTECTION STATION PRESSURIZED (MISCELLANEOUS) ×3
SET VENACAVA FILTER RETRIEVAL (MISCELLANEOUS) ×3 IMPLANT
SHEATH FAST CATH 12F 12CM (SHEATH) ×3 IMPLANT
SHEATH PINNACLE 5F 10CM (SHEATH) ×3 IMPLANT
STATION PROTECTION PRESSURIZED (MISCELLANEOUS) ×2 IMPLANT
TRAY PV CATH (CUSTOM PROCEDURE TRAY) ×3 IMPLANT

## 2015-10-31 NOTE — Discharge Instructions (Signed)
Inferior Vena Cava Filter Insertion, Care After  Refer to this sheet in the next few weeks. These instructions provide you with information on caring for yourself after your procedure. Your health care provider may also give you more specific instructions. Your treatment has been planned according to current medical practices, but problems sometimes occur. Call your health care provider if you have any problems or questions after your procedure.  WHAT TO EXPECT AFTER THE PROCEDURE  After your procedure, it is typical to have the following:   Mild pain in the area where the filter was inserted.   Mild bruising in the area where the filter was inserted.  HOME CARE INSTRUCTIONS   You will be given medicine to control pain. Only take over-the-counter or prescription medicines for pain, fever, or discomfort as directed by your health care provider.   A bandage (dressing) has been placed over the insertion site. Follow your health care provider's instructions on how to care for it.   Keep the insertion site clean and dry.   Do not soak in a bath tub or pool until the filter insertion site has healed.   Do not drive if you are taking narcotic pain medicines. Follow your health care provider's instructions about driving.   Do not return to work or school until your health care provider says it is okay.    Keep all follow-up appointments.   SEEK IMMEDIATE MEDICAL CARE IF:   You develop swelling and discoloration or pain in the legs.   Your legs become pale and cold or blue.   You develop shortness of breath, feel faint, or pass out.   You develop chest pain, a cough, or difficulty breathing.   You cough up blood.   You develop a rash or feel you are having problems that may be a side effect of medicines.   You develop weakness, difficulty moving your arms or legs, or balance problems.   You develop problems with speech or vision.     This information is not intended to replace advice given to you by your  health care provider. Make sure you discuss any questions you have with your health care provider.     Document Released: 01/20/2013 Document Reviewed: 01/20/2013  Elsevier Interactive Patient Education 2016 [employer]

## 2015-10-31 NOTE — Progress Notes (Signed)
I went to clients room to remove the IV from her hand. She is in the room yelling. There was drink and food spilled on the floor. I asked if I can remove the IV before she leaves. She said "you"re not gonna remove nothing, I'm leaving with this." I asked again if I can remove the IV site before she leaves. She starts yelling "if I were white I wouldn't be treated this way." She communicated that Dr. Einar Gip was refusing to give her a RX for pain. 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 As I am talking to her she is dressing, she puts on her bra and pants and is reaching for her shirt. I leave the room and ask if the nurse tech if he will go in and see if the client will allow him to remove her IV. He goes to the door and starts to enter, the client begins to yell that "that nigger is a pervert." And "you niggers better get out of my room." "you freaky white people probably like that but I don't." The NT apologized and moved to vacate the situation as quickly as possible. The clients significant other followed the NT out of the room and down the hall to the nurses station, yelling "what the fuck was that?" and "what the fuck did 'mm' mean?" He then addressed the charge nurse saying "that [place] just walked in to her room to see her naked."  Then walked back to the clients room. I instructed the NS to call security and our Surveyor, quantity. As I was in the clients room trying to calm her enough to get the IV out to be able to discharge her when our AD arrived to the room. Shortly thereafter security arrived to the room. The client and her significant other begin to yell about cops, commenting about white cops killing black people and saying "I could've been naked when you walked in here" Then our AD asked if everyone could leave the room. The AD was  able to calm the client enough to allow [place] to remove the IV and to do discharge teaching.

## 2015-10-31 NOTE — Interval H&P Note (Signed)
History and Physical Interval Note:  10/31/2015 9:39 AM  Lori Ford  has presented today for surgery, with the diagnosis of Patient wants it out  The various methods of treatment have been discussed with the patient and family. After consideration of risks, benefits and other options for treatment, the patient has consented to  Procedure(s): IVC Filter Removal (N/A) as a surgical intervention .  The patient's history has been reviewed, patient examined, no change in status, stable for surgery.  I have reviewed the patient's chart and labs.  Questions were answered to the patient's satisfaction.   On the cath table along with the staff members presence I have discussed that the filter has been there longer than 2 years and discussed success of retrieval is less, but patient would want me to attempt this. Again understands the risks and benefits of the procedure.  Adrian Prows

## 2015-10-31 NOTE — Progress Notes (Signed)
Patient requesting a Rx for pain medicine for home use. Dr. Einar Gip paged and will come to speak with patient.

## 2015-10-31 NOTE — Progress Notes (Signed)
I was paged by the nurse, stating that patient very loud and requesting pain medication, narcotics with codeine No. 3 for discharge due to neck pain.  I went to see the patient, site normal without hematoma, no bleeding or bruising, I clearly explained to the patient that the procedure should not cause significant pain and I offered her Ultram, I stated to her that I would not be prescribing her any narcotics beyond Ultram as she should not have any significant pain from the procedure itself.  I also reminded her that at the end of the procedure, she stated that she was surprised that she did not feel anything and she only felt initial needle stick.  Patient used indecent words, but not against me but in general, also stated that the tech should not have removed the IV site in the neck that I should have removed the IV access. I explained to her that arterial and venous access or removed by the Cath Lab for a trained Lab tech/RN. She stated that she'll hear from lawyer regarding the care. I tried to reassure her, she tossed her table, pulled out her telemetry strips and got up from the bed stating she is on walk out. I offered her Tylenol No. 3 but only for 1 day dose which she refused.   Adrian Prows, MD 10/31/2015, 12:16 PM [facility] [place] Pager: [phone] Office: [phone] If no answer: Cell:  [phone]

## 2015-11-03 ENCOUNTER — Emergency Department (HOSPITAL_COMMUNITY)
Admission: EM | Admit: 2015-11-03 | Discharge: 2015-11-03 | Disposition: A | Discharge status: Home / Self Care | Payer: Medicaid Other | Attending: Emergency Medicine | Admitting: Emergency Medicine

## 2015-11-03 ENCOUNTER — Encounter (HOSPITAL_COMMUNITY): Payer: Self-pay | Admitting: Emergency Medicine

## 2015-11-03 DIAGNOSIS — Z7984 Long term (current) use of oral hypoglycemic drugs: Secondary | ICD-10-CM | POA: Diagnosis not present

## 2015-11-03 DIAGNOSIS — I10 Essential (primary) hypertension: Secondary | ICD-10-CM | POA: Diagnosis not present

## 2015-11-03 DIAGNOSIS — Z760 Encounter for issue of repeat prescription: Secondary | ICD-10-CM | POA: Diagnosis not present

## 2015-11-03 DIAGNOSIS — G40909 Epilepsy, unspecified, not intractable, without status epilepticus: Secondary | ICD-10-CM | POA: Diagnosis present

## 2015-11-03 DIAGNOSIS — F419 Anxiety disorder, unspecified: Secondary | ICD-10-CM | POA: Diagnosis not present

## 2015-11-03 DIAGNOSIS — Z794 Long term (current) use of insulin: Secondary | ICD-10-CM | POA: Diagnosis not present

## 2015-11-03 DIAGNOSIS — J45909 Unspecified asthma, uncomplicated: Secondary | ICD-10-CM | POA: Insufficient documentation

## 2015-11-03 DIAGNOSIS — Z86011 Personal history of benign neoplasm of the brain: Secondary | ICD-10-CM | POA: Insufficient documentation

## 2015-11-03 DIAGNOSIS — Z91040 Latex allergy status: Secondary | ICD-10-CM | POA: Diagnosis not present

## 2015-11-03 DIAGNOSIS — F17210 Nicotine dependence, cigarettes, uncomplicated: Secondary | ICD-10-CM | POA: Diagnosis not present

## 2015-11-03 DIAGNOSIS — E119 Type 2 diabetes mellitus without complications: Secondary | ICD-10-CM | POA: Insufficient documentation

## 2015-11-03 LAB — CBG MONITORING, ED: Glucose-Capillary: 110 mg/dL — ABNORMAL HIGH (ref 65–99)

## 2015-11-03 MED ORDER — GLUCOSE BLOOD VI STRP: ORAL_STRIP | Status: DC

## 2015-11-03 MED ORDER — ALPRAZOLAM 1 MG PO TABS: 1.0000 mg | ORAL_TABLET | Freq: Three times a day (TID) | ORAL | Status: DC | PRN

## 2015-11-03 NOTE — ED Notes (Signed)
Pt verbalized understanding of d/c instructions, prescriptions, and follow-up care. No further questions/concerns, VSS, ambulatory w/ steady gait (refused wheelchair) 

## 2015-11-03 NOTE — ED Provider Notes (Signed)
CSN: [id number]     Arrival date & time 11/03/15  1345 History   First MD Initiated Contact with Patient 11/03/15 1504     Chief Complaint  Patient presents with  . Panic Attack  . Seizures     (Consider location/radiation/quality/duration/timing/severity/associated sxs/prior Treatment) HPI Comments: 40 year old female with history of seizure disorder compliant with medications, DVT and IVC filter which was recently removed, asthma, hypertension presents with anxiety symptoms, recent seizures and medication refill needs. Patient per family has had brief generalized seizures during her sleep maybe once per night. This is increased frequency. Patient is compliant with her medications. Patient does not sleep regularly. Patient denies any fevers or infectious symptoms. No headaches or neurologic symptoms. Patient has been out of her diabetic materials to check her glucose. Patient has support for her anxiety which is chronic.  The history is provided by the patient.    Past Medical History  Diagnosis Date  . Seizure disorder, grand mal ([place])     dx 2005  . Seizures ([place])     due to head trauma as adult  . Asthma     as child  . Meningioma ([place])   . Gallstones     s/p cholecystectomy  . HTN (hypertension)   . GERD (gastroesophageal reflux disease)   . Allergic rhinitis   . Cigarette nicotine dependence with withdrawal   . DVT (deep venous thrombosis) ([place])   . Chronic back pain   . Sciatic pain   . Arthritis   . HIV (human immunodeficiency virus infection) [facility])    Past Surgical History  Procedure Laterality Date  . Brain surgery      2013 to remove a meningioma  . Cholecystectomy    . Peripheral vascular catheterization N/A 10/31/2015    Procedure: IVC Filter Removal;  Surgeon: Adrian Prows, MD;  Location: [place] [facility];  Service: Cardiovascular;  Laterality: N/A;  . Peripheral vascular catheterization  10/31/2015    Procedure: IVC/SVC Venography;  Surgeon: Adrian Prows, MD;   Location: [place] [facility];  Service: Cardiovascular;;   Family History  Problem Relation Age of Onset  . Other Mother     varicose vein  . Asthma Mother   . High blood pressure Mother   . Deep vein thrombosis Neg Hx   . Pulmonary embolism Neg Hx   . Cancer    . Diabetes    . High blood pressure    . Asthma    . Thyroid disease    . Cancer Father    Social History  Substance Use Topics  . Smoking status: Current Every Day Smoker -- 0.25 packs/day for 18 years    Types: Cigarettes  . Smokeless tobacco: Never Used  . Alcohol Use: No   OB History    Gravida Para Term Preterm AB TAB SAB Ectopic Multiple Living   3 1 1  2 2    1      Review of Systems  Constitutional: Negative for fever and chills.  HENT: Negative for congestion.   Eyes: Negative for visual disturbance.  Respiratory: Negative for shortness of breath.   Cardiovascular: Negative for chest pain.  Gastrointestinal: Negative for vomiting and abdominal pain.  Genitourinary: Negative for dysuria and flank pain.  Musculoskeletal: Negative for back pain, neck pain and neck stiffness.  Skin: Negative for rash.  Neurological: Positive for seizures. Negative for light-headedness and headaches.  Psychiatric/Behavioral: The patient is nervous/anxious.       Allergies  Ibuprofen; Clindamycin/lincomycin;  Coconut flavor; Doxycycline hyclate; Flagyl; Latex; Other; and Aspirin  Home Medications   Prior to Admission medications   Medication Sig Start Date End Date Taking? Authorizing Provider  albuterol (PROVENTIL HFA;VENTOLIN HFA) 108 (90 BASE) MCG/ACT inhaler Inhale 2 puffs into the lungs every 4 (four) hours as needed for wheezing or shortness of breath. 08/02/14   Britt Bottom, NP  albuterol (PROVENTIL) (2.5 MG/3ML) 0.083% nebulizer solution Take 2.5 mg by nebulization every 6 (six) hours as needed for wheezing or shortness of breath.    Historical Provider, MD  ALPRAZolam Duanne Moron) 1 MG tablet Take 2 tablets (2  mg total) by mouth 3 (three) times daily as needed for anxiety. 12/30/14   Nicole Pisciotta, PA-C  ALPRAZolam Duanne Moron) 1 MG tablet Take 1 tablet (1 mg total) by mouth 3 (three) times daily as needed for anxiety. 11/03/15   Elnora Morrison, MD  amphetamine-dextroamphetamine (ADDERALL XR) 30 MG 24 hr capsule Take 30 mg by mouth 2 (two) times daily.    Historical Provider, MD  beclomethasone (QVAR) 40 MCG/ACT inhaler Inhale 2 puffs into the lungs 2 (two) times daily as needed (for shortness of breath).     Historical Provider, MD  cyclobenzaprine (FLEXERIL) 10 MG tablet Take 1 tablet (10 mg total) by mouth 2 (two) times daily as needed for muscle spasms. 02/01/13   Larence Penning, MD  divalproex (DEPAKOTE ER) 500 MG 24 hr tablet Take 6 tablets (3,000 mg total) by mouth daily. 09/13/15   Pieter Partridge, DO  folic acid (FOLVITE) 1 MG tablet Take 1 mg by mouth daily. Reported on 09/06/2015    Historical Provider, MD  gabapentin (NEURONTIN) 400 MG capsule Take 800 mg by mouth 3 (three) times daily.     Historical Provider, MD  glucose blood test strip Use as instructed 11/03/15   Elnora Morrison, MD  hydrochlorothiazide (HYDRODIURIL) 25 MG tablet Take 1 tablet (25 mg total) by mouth daily. 07/09/13   Harvie Heck, PA-C  insulin glargine (LANTUS) 100 UNIT/ML injection Inject 10 Units into the skin daily.    Historical Provider, MD  levETIRAcetam (KEPPRA) 500 MG tablet Take 2 tablets (1,000 mg total) by mouth 2 (two) times daily. 12/30/14   Nicole Pisciotta, PA-C  loratadine (CLARITIN) 10 MG tablet Take 10 mg by mouth daily.  03/03/14   Historical Provider, MD  Mefenamic Acid 250 MG CAPS Take 2 capsules (500 mg total) by mouth 3 (three) times daily. X 4 days 08/30/14   Mercedes Camprubi-Soms, PA-C  meloxicam (MOBIC) 15 MG tablet Take 1 tablet (15 mg total) by mouth daily. 12/07/14   Tiffany Carlota Raspberry, PA-C  metroNIDAZOLE (METROGEL VAGINAL) 0.75 % vaginal gel Place 1 Applicatorful vaginally 2 (two) times daily. X 5 days 08/30/14    Mercedes Camprubi-Soms, PA-C  omeprazole (PRILOSEC) 20 MG capsule Take 20 mg by mouth 2 (two) times daily.    Historical Provider, MD  ondansetron (ZOFRAN ODT) 4 MG disintegrating tablet Take 1 tablet (4 mg total) by mouth every 8 (eight) hours as needed for nausea or vomiting. 03/30/15   Nona Dell, PA-C  oxyCODONE-acetaminophen (PERCOCET) 10-325 MG per tablet Take 1 tablet by mouth every 4 (four) hours as needed for pain.    Historical Provider, MD  oxyCODONE-acetaminophen (PERCOCET) 10-325 MG tablet Take 1 tablet by mouth every 4 (four) hours as needed for pain. 10/31/15   Adrian Prows, MD  oxymetazoline (AFRIN NASAL SPRAY) 0.05 % nasal spray Place 1 spray into both nostrils 2 (two) times daily. 03/30/15  Chesley Noon Nadeau, PA-C  PARoxetine (PAXIL) 40 MG tablet Take 1 tablet (40 mg total) by mouth daily. 07/09/13   Harvie Heck, PA-C  promethazine (PHENERGAN) 6.25 MG/5ML syrup Take 10 mLs (12.5 mg total) by mouth every 6 (six) hours as needed for nausea or vomiting. 07/08/15   Clayton Bibles, PA-C  propranolol (INDERAL) 10 MG tablet Take 10 mg by mouth 2 (two) times daily.  01/28/14   Historical Provider, MD  sitaGLIPtin (JANUVIA) 50 MG tablet Take 50 mg by mouth daily.    Historical Provider, MD  sodium chloride (OCEAN) 0.65 % nasal spray Place 1 spray into the nose 2 (two) times daily as needed for congestion.     Historical Provider, MD  triamcinolone ointment (KENALOG) 0.5 % Apply 1 application topically 2 (two) times daily.    Historical Provider, MD  ZOLMitriptan (ZOMIG) 2.5 MG tablet Take 1 tablet by mouth 4 (four) times daily. 03/25/15   Historical Provider, MD   BP 130/84 mmHg  Pulse 100  Temp(Src) 98.3 F (36.8 C)  Resp 20  SpO2 100% Physical Exam  Constitutional: She is oriented to person, place, and time. She appears well-developed and well-nourished.  HENT:  Head: Normocephalic and atraumatic.  Eyes: Conjunctivae are normal. Right eye exhibits no discharge. Left eye  exhibits no discharge.  Neck: Normal range of motion. Neck supple. No tracheal deviation present.  Cardiovascular: Regular rhythm.   Pulmonary/Chest: Effort normal and breath sounds normal.  Abdominal: Soft. She exhibits no distension. There is no tenderness. There is no guarding.  Musculoskeletal: She exhibits no edema.  Neurological: She is alert and oriented to person, place, and time. No cranial nerve deficit or sensory deficit. She exhibits normal muscle tone. GCS eye subscore is 4. GCS verbal subscore is 5. GCS motor subscore is 6.  Skin: Skin is warm. No rash noted.  Psychiatric: She has a normal mood and affect.  Nursing note and vitals reviewed.   ED Course  Procedures (including critical care time) Labs Review Labs Reviewed  CBG MONITORING, ED - Abnormal; Notable for the following:    Glucose-Capillary 110 (*)    All other components within normal limits    Imaging Review No results found. I have personally reviewed and evaluated these images and lab results as part of my medical decision-making.   EKG Interpretation None      MDM   Final diagnoses:  Anxiety  Seizure disorder ([place])  Medication refill  Controlled type 2 diabetes mellitus without complication, unspecified long term insulin use status (HCC)   Accucheck test strips Lancets for inside meter \ Patient presents for medication refill, acute check strips refilled. Patient does have her seizure medications no seizure activity in the ER, normal neurologic exam vitals overall unremarkable. Discussed continued follow-up outpatient for seizures. Patient has been out of her Xanax from [place] 2 weeks. This may be contribute to increased seizures however likely multifactorial. Plan for short course to cover the weekend and patient is to follow-up with her doctor on Monday for refill if indicated.  Results and differential diagnosis were discussed with the patient/parent/guardian. Xrays were independently reviewed by  myself.  Close follow up outpatient was discussed, comfortable with the plan.   Medications - No data to display  Filed Vitals:   11/03/15 1350  BP: 130/84  Pulse: 100  Temp: 98.3 F (36.8 C)  Resp: 20  SpO2: 100%    Final diagnoses:  Anxiety  Seizure disorder (HCC)  Medication refill  Controlled type  2 diabetes mellitus without complication, unspecified long term insulin use status (HCC)        Elnora Morrison, MD 11/03/15 [phone]

## 2015-11-03 NOTE — Discharge Instructions (Signed)
Please see her physician on Monday to review medications, seizure and diabetes control.  If you were given medicines take as directed.  If you are on coumadin or contraceptives realize their levels and effectiveness is altered by many different medicines.  If you have any reaction (rash, tongues swelling, other) to the medicines stop taking and see a physician.    If your blood pressure was elevated in the ER make sure you follow up for management with a primary doctor or return for chest pain, shortness of breath or stroke symptoms.  Please follow up as directed and return to the ER or see a physician for new or worsening symptoms.  Thank you. Filed Vitals:   11/03/15 1350  BP: 130/84  Pulse: 100  Temp: 98.3 F (36.8 C)  Resp: 20  SpO2: 100%

## 2015-11-03 NOTE — ED Notes (Addendum)
States has been out of anxiety med and she she has had panic attacks and her husband states that she has been  Having sz in her sleep, just had IVC filter removed on Monday here at [facility], has young child with her (40 year old)

## 2015-11-06 ENCOUNTER — Other Ambulatory Visit: Payer: Self-pay | Admitting: Neurology

## 2015-11-27 MED FILL — ACCU-CHEK AVIVA PLUS TEST S: 25 days supply | Qty: 100 | Fill #0

## 2015-11-27 MED FILL — ACCU-CHEK SOFTCLIX LANCETS: 25 days supply | Qty: 100 | Fill #0

## 2015-12-01 MED FILL — PROMETHAZINE-DM SYRUP: 6.25-15 | 20 days supply | Qty: 200 | Fill #0

## 2015-12-01 MED FILL — diazePAM 5 MG TABS: 5 | 2 days supply | Qty: 6 | Fill #0

## 2015-12-03 ENCOUNTER — Encounter (HOSPITAL_COMMUNITY): Payer: Self-pay | Admitting: Nurse Practitioner

## 2015-12-03 ENCOUNTER — Ambulatory Visit (HOSPITAL_COMMUNITY)
Admission: EM | Admit: 2015-12-03 | Discharge: 2015-12-03 | Disposition: A | Discharge status: Home / Self Care | Payer: Medicaid Other | Attending: Internal Medicine | Admitting: Internal Medicine

## 2015-12-03 ENCOUNTER — Emergency Department (HOSPITAL_COMMUNITY)
Admission: EM | Admit: 2015-12-03 | Discharge: 2015-12-03 | Disposition: A | Discharge status: Home / Self Care | Payer: Medicaid Other | Attending: Emergency Medicine | Admitting: Emergency Medicine

## 2015-12-03 ENCOUNTER — Encounter (HOSPITAL_COMMUNITY): Payer: Self-pay | Admitting: Emergency Medicine

## 2015-12-03 DIAGNOSIS — S39012A Strain of muscle, fascia and tendon of lower back, initial encounter: Secondary | ICD-10-CM | POA: Diagnosis not present

## 2015-12-03 DIAGNOSIS — F17210 Nicotine dependence, cigarettes, uncomplicated: Secondary | ICD-10-CM | POA: Diagnosis not present

## 2015-12-03 DIAGNOSIS — X58XXXA Exposure to other specified factors, initial encounter: Secondary | ICD-10-CM | POA: Insufficient documentation

## 2015-12-03 DIAGNOSIS — S3992XA Unspecified injury of lower back, initial encounter: Secondary | ICD-10-CM | POA: Insufficient documentation

## 2015-12-03 DIAGNOSIS — J45909 Unspecified asthma, uncomplicated: Secondary | ICD-10-CM | POA: Insufficient documentation

## 2015-12-03 DIAGNOSIS — Y929 Unspecified place or not applicable: Secondary | ICD-10-CM | POA: Diagnosis not present

## 2015-12-03 DIAGNOSIS — S199XXA Unspecified injury of neck, initial encounter: Secondary | ICD-10-CM | POA: Diagnosis not present

## 2015-12-03 DIAGNOSIS — Y939 Activity, unspecified: Secondary | ICD-10-CM | POA: Insufficient documentation

## 2015-12-03 DIAGNOSIS — Z5321 Procedure and treatment not carried out due to patient leaving prior to being seen by health care provider: Secondary | ICD-10-CM | POA: Diagnosis not present

## 2015-12-03 DIAGNOSIS — Y999 Unspecified external cause status: Secondary | ICD-10-CM | POA: Insufficient documentation

## 2015-12-03 DIAGNOSIS — I10 Essential (primary) hypertension: Secondary | ICD-10-CM | POA: Insufficient documentation

## 2015-12-03 LAB — CBC
HEMATOCRIT: 38.3 % (ref 36.0–46.0)
HEMOGLOBIN: 12.5 g/dL (ref 12.0–15.0)
MCH: 30.1 pg (ref 26.0–34.0)
MCHC: 32.6 g/dL (ref 30.0–36.0)
MCV: 92.3 fL (ref 78.0–100.0)
Platelets: 247 10*3/uL (ref 150–400)
RBC: 4.15 MIL/uL (ref 3.87–5.11)
RDW: 14.2 % (ref 11.5–15.5)
WBC: 5 10*3/uL (ref 4.0–10.5)

## 2015-12-03 LAB — I-STAT BETA HCG BLOOD, ED (MC, WL, AP ONLY): I-stat hCG, quantitative: <5 m[IU]/mL (ref <5)

## 2015-12-03 LAB — VALPROIC ACID LEVEL: Valproic Acid Lvl: <10 ug/mL — ABNORMAL LOW (ref 50.0–100.0)

## 2015-12-03 MED ORDER — PROMETHAZINE HCL 6.25 MG/5ML PO SYRP: 12.5000 mg | ORAL_SOLUTION | Freq: Four times a day (QID) | ORAL | 0 refills | Status: DC | PRN

## 2015-12-03 MED ORDER — HYDROCODONE-ACETAMINOPHEN 10-325 MG PO TABS: 1.0000 | ORAL_TABLET | Freq: Four times a day (QID) | ORAL | 0 refills | Status: DC | PRN

## 2015-12-03 NOTE — ED Triage Notes (Signed)
Pt c/o severe lower back pain radiating up to her neck. Onset yesterday when she fell during a seizure. She reports a history of seizures which she is medicated for. She has tried tylenol, ice, heat, muscle relaxers at hoem with no relief of pain. She denies any bowel/bladder changes. She is ambulatory and able to MAE.

## 2015-12-03 NOTE — ED Provider Notes (Signed)
CSN: [id number]     Arrival date & time 12/03/15  1527 History   First MD Initiated Contact with Patient 12/03/15 1553     Chief Complaint  Patient presents with  . Back Pain   (Consider location/radiation/quality/duration/timing/severity/associated sxs/prior Treatment) HPI 40 year old female states that she fell off her bed last night while having a seizure. She still is complaining of back pain at this time. He denies any numbness or tingling in the distal extremities she denies any bowel or bladder dysfunction. She is using multiple medications at home without relief of her symptoms. Past Medical History:  Diagnosis Date  . Allergic rhinitis   . Arthritis   . Asthma    as child  . Chronic back pain   . Cigarette nicotine dependence with withdrawal   . DVT (deep venous thrombosis) ([place])   . Gallstones    s/p cholecystectomy  . GERD (gastroesophageal reflux disease)   . HIV (human immunodeficiency virus infection) ([place])   . HTN (hypertension)   . Meningioma ([place])   . Sciatic pain   . Seizure disorder, grand mal ([place])    dx 2005  . Seizures ([place])    due to head trauma as adult   Past Surgical History:  Procedure Laterality Date  . BRAIN SURGERY     2013 to remove a meningioma  . CHOLECYSTECTOMY    . PERIPHERAL VASCULAR CATHETERIZATION N/A 10/31/2015   Procedure: IVC Filter Removal;  Surgeon: Adrian Prows, MD;  Location: [place] [facility];  Service: Cardiovascular;  Laterality: N/A;  . PERIPHERAL VASCULAR CATHETERIZATION  10/31/2015   Procedure: IVC/SVC Venography;  Surgeon: Adrian Prows, MD;  Location: [place] [facility];  Service: Cardiovascular;;   Family History  Problem Relation Age of Onset  . Other Mother     varicose vein  . Asthma Mother   . High blood pressure Mother   . Cancer Father   . Cancer    . Diabetes    . High blood pressure    . Asthma    . Thyroid disease    . Deep vein thrombosis Neg Hx   . Pulmonary embolism Neg Hx    Social History  Substance  Use Topics  . Smoking status: Current Every Day Smoker    Packs/day: 0.25    Years: 18.00    Types: Cigarettes  . Smokeless tobacco: Never Used  . Alcohol use No   OB History    Gravida Para Term Preterm AB Living   3 1 1   2 1    SAB TAB Ectopic Multiple Live Births     2           Review of Systems  Denies: HEADACHE, NAUSEA, ABDOMINAL PAIN, CHEST PAIN, CONGESTION, DYSURIA, SHORTNESS OF BREATH  Allergies  Ibuprofen; Clindamycin/lincomycin; Coconut flavor; Doxycycline hyclate; Flagyl [metronidazole]; Latex; Other; and Aspirin  Home Medications   Prior to Admission medications   Medication Sig Start Date End Date Taking? Authorizing Provider  albuterol (PROVENTIL HFA;VENTOLIN HFA) 108 (90 BASE) MCG/ACT inhaler Inhale 2 puffs into the lungs every 4 (four) hours as needed for wheezing or shortness of breath. 08/02/14   Britt Bottom, NP  albuterol (PROVENTIL) (2.5 MG/3ML) 0.083% nebulizer solution Take 2.5 mg by nebulization every 6 (six) hours as needed for wheezing or shortness of breath.    Historical Provider, MD  ALPRAZolam Duanne Moron) 1 MG tablet Take 2 tablets (2 mg total) by mouth 3 (three) times daily as needed for anxiety. 12/30/14  Nicole Pisciotta, PA-C  ALPRAZolam (XANAX) 1 MG tablet Take 1 tablet (1 mg total) by mouth 3 (three) times daily as needed for anxiety. 11/03/15   Elnora Morrison, MD  amphetamine-dextroamphetamine (ADDERALL XR) 30 MG 24 hr capsule Take 30 mg by mouth 2 (two) times daily.    Historical Provider, MD  beclomethasone (QVAR) 40 MCG/ACT inhaler Inhale 2 puffs into the lungs 2 (two) times daily as needed (for shortness of breath).     Historical Provider, MD  cyclobenzaprine (FLEXERIL) 10 MG tablet Take 1 tablet (10 mg total) by mouth 2 (two) times daily as needed for muscle spasms. 02/01/13   Larence Penning, MD  divalproex (DEPAKOTE ER) 500 MG 24 hr tablet TAKE 6 TABLETS (3,000 MG TOTAL) BY MOUTH DAILY. 11/06/15   Pieter Partridge, DO  folic acid (FOLVITE) 1 MG  tablet Take 1 mg by mouth daily. Reported on 09/06/2015    Historical Provider, MD  gabapentin (NEURONTIN) 400 MG capsule Take 800 mg by mouth 3 (three) times daily.     Historical Provider, MD  glucose blood test strip Use as instructed 11/03/15   Elnora Morrison, MD  hydrochlorothiazide (HYDRODIURIL) 25 MG tablet Take 1 tablet (25 mg total) by mouth daily. 07/09/13   Harvie Heck, PA-C  HYDROcodone-acetaminophen (NORCO) 10-325 MG tablet Take 1 tablet by mouth every 6 (six) hours as needed. 12/03/15   Konrad Felix, PA  insulin glargine (LANTUS) 100 UNIT/ML injection Inject 10 Units into the skin daily.    Historical Provider, MD  levETIRAcetam (KEPPRA) 500 MG tablet Take 2 tablets (1,000 mg total) by mouth 2 (two) times daily. 12/30/14   Nicole Pisciotta, PA-C  loratadine (CLARITIN) 10 MG tablet Take 10 mg by mouth daily.  03/03/14   Historical Provider, MD  Mefenamic Acid 250 MG CAPS Take 2 capsules (500 mg total) by mouth 3 (three) times daily. X 4 days 08/30/14   Mercedes Camprubi-Soms, PA-C  meloxicam (MOBIC) 15 MG tablet Take 1 tablet (15 mg total) by mouth daily. 12/07/14   Tiffany Carlota Raspberry, PA-C  metroNIDAZOLE (METROGEL VAGINAL) 0.75 % vaginal gel Place 1 Applicatorful vaginally 2 (two) times daily. X 5 days 08/30/14   Mercedes Camprubi-Soms, PA-C  omeprazole (PRILOSEC) 20 MG capsule Take 20 mg by mouth 2 (two) times daily.    Historical Provider, MD  ondansetron (ZOFRAN ODT) 4 MG disintegrating tablet Take 1 tablet (4 mg total) by mouth every 8 (eight) hours as needed for nausea or vomiting. 03/30/15   Nona Dell, PA-C  oxyCODONE-acetaminophen (PERCOCET) 10-325 MG per tablet Take 1 tablet by mouth every 4 (four) hours as needed for pain.    Historical Provider, MD  oxyCODONE-acetaminophen (PERCOCET) 10-325 MG tablet Take 1 tablet by mouth every 4 (four) hours as needed for pain. 10/31/15   Adrian Prows, MD  oxymetazoline (AFRIN NASAL SPRAY) 0.05 % nasal spray Place 1 spray into both nostrils  2 (two) times daily. 03/30/15   Nona Dell, PA-C  PARoxetine (PAXIL) 40 MG tablet Take 1 tablet (40 mg total) by mouth daily. 07/09/13   Harvie Heck, PA-C  promethazine (PHENERGAN) 6.25 MG/5ML syrup Take 10 mLs (12.5 mg total) by mouth every 6 (six) hours as needed for nausea or vomiting. 07/08/15   Clayton Bibles, PA-C  promethazine (PHENERGAN) 6.25 MG/5ML syrup Take 10 mLs (12.5 mg total) by mouth every 6 (six) hours as needed for nausea or vomiting. 12/03/15   Konrad Felix, PA  propranolol (INDERAL) 10 MG tablet Take 10  mg by mouth 2 (two) times daily.  01/28/14   Historical Provider, MD  sitaGLIPtin (JANUVIA) 50 MG tablet Take 50 mg by mouth daily.    Historical Provider, MD  sodium chloride (OCEAN) 0.65 % nasal spray Place 1 spray into the nose 2 (two) times daily as needed for congestion.     Historical Provider, MD  triamcinolone ointment (KENALOG) 0.5 % Apply 1 application topically 2 (two) times daily.    Historical Provider, MD  ZOLMitriptan (ZOMIG) 2.5 MG tablet Take 1 tablet by mouth 4 (four) times daily. 03/25/15   Historical Provider, MD   Meds Ordered and Administered this Visit  Medications - No data to display  BP 151/96   Pulse 90   Temp 99.6 F (37.6 C) (Oral)   Resp 17   LMP 11/20/2015   SpO2 100%  No data found.   Physical Exam NURSES NOTES AND VITAL SIGNS REVIEWED. CONSTITUTIONAL: Well developed, well nourished, no acute distress HEENT: normocephalic, atraumatic EYES: Conjunctiva normal NECK:normal ROM, supple, no adenopathy PULMONARY:No respiratory distress, normal effort ABDOMINAL: Soft, ND, NT BS+, No CVAT MUSCULOSKELETAL: Normal ROM of all extremities, lumbar spine is minimally tender to palpation. No significant spasm is noted. No bruising no abrasions noted. SKIN: warm and dry without rash PSYCHIATRIC: Mood and affect, behavior are normal  Urgent Care Course   Clinical Course    Procedures (including critical care time)  Labs  Review Labs Reviewed - No data to display  Imaging Review No results found.   Visual Acuity Review  Right Eye Distance:   Left Eye Distance:   Bilateral Distance:    Right Eye Near:   Left Eye Near:    Bilateral Near:        Patient fell last night while having a seizure. Ambulatory with no significant back findings. No indication for imaging studies at this time. MDM   1. Lumbar strain, initial encounter     Patient is reassured that there are no issues that require transfer to higher level of care at this time or additional tests. Patient is advised to continue home symptomatic treatment. Patient is advised that if there are new or worsening symptoms to attend the emergency department, contact primary care provider, or return to UC. Instructions of care provided discharged home in stable condition.    THIS NOTE WAS GENERATED USING A VOICE RECOGNITION SOFTWARE PROGRAM. ALL REASONABLE EFFORTS  WERE MADE TO PROOFREAD THIS DOCUMENT FOR ACCURACY.  I have verbally reviewed the discharge instructions with the patient. A printed AVS was given to the patient.  All questions were answered prior to discharge.      Konrad Felix, [place] 12/03/15 2058

## 2015-12-03 NOTE — ED Triage Notes (Signed)
Pt c/o had a seizure yesterday causing injury to back and neck. Pt denies having urinary or bowel incontinent.

## 2015-12-03 NOTE — ED Notes (Signed)
UC called nurse first to inform that the patient had left and went to UC. Pt taken out of the system.

## 2015-12-05 ENCOUNTER — Telehealth: Payer: Self-pay | Admitting: *Deleted

## 2015-12-05 NOTE — Telephone Encounter (Signed)
[place] at [facility] called, asking if this patient had contacted [facility] yet to make an appointment, is being referred for "new diagnosis HIV" per new PCP Dr Vista Lawman.  RN returned the call, stating that she had not been scheduled and requested the records to be faxed again. Per chart review, the patient was last seen at [facility] by Dr. Tommy Medal 07/2014, was supposed to follow up in 6 weeks and has fallen out of care.   RN contacted patient, scheduled her for 8/28 4:00 with Dr. Tommy Medal.  RN notified pharmacy as well - patient is epileptic, is off HIV medication.   Landis Gandy, RN

## 2015-12-10 ENCOUNTER — Encounter (HOSPITAL_COMMUNITY): Payer: Self-pay | Admitting: Emergency Medicine

## 2015-12-10 ENCOUNTER — Ambulatory Visit (HOSPITAL_COMMUNITY)
Admission: EM | Admit: 2015-12-10 | Discharge: 2015-12-10 | Disposition: A | Discharge status: Home / Self Care | Payer: Medicaid Other | Attending: Surgery | Admitting: Surgery

## 2015-12-10 DIAGNOSIS — F419 Anxiety disorder, unspecified: Secondary | ICD-10-CM | POA: Diagnosis not present

## 2015-12-10 LAB — GLUCOSE, CAPILLARY: Glucose-Capillary: 75 mg/dL (ref 65–99)

## 2015-12-10 MED ORDER — GLUCOSE BLOOD VI STRP: ORAL_STRIP | 2 refills | Status: DC

## 2015-12-10 MED ORDER — PROMETHAZINE HCL 6.25 MG/5ML PO SYRP: 12.5000 mg | ORAL_SOLUTION | Freq: Three times a day (TID) | ORAL | 1 refills | Status: DC | PRN

## 2015-12-10 NOTE — ED Triage Notes (Signed)
Patient complains of panic attack.  Patient reports sob, shaking, tearful.   Patient has been out of insulin for 3 weeks.  Restarted insulin on Friday.   Patient is out of nausea medicine.   Patient is out of glucose testing strips.   Patient does have an appt with her pcp next month. Patient's husband escorted to the bedside for support

## 2015-12-10 NOTE — ED Notes (Signed)
Late entry.  Attempted stick x 2 for blood work, unable to obtain blood.  Mack Guise, pa notified.

## 2015-12-10 NOTE — Discharge Instructions (Signed)
Keep appointment as scheduled with primary care physician.    Make sure to check blood sugars at least 2-3 times daily.

## 2015-12-10 NOTE — ED Notes (Signed)
Patient denies pain and is resting comfortably.  

## 2015-12-10 NOTE — ED Provider Notes (Signed)
[place]    CSN: [id number] Arrival date & time: 12/10/15  1348  First Provider Contact:  First MD Initiated Contact with Patient 12/10/15 1713        History   Chief Complaint Chief Complaint  Patient presents with  . Panic Attack    HPI Lori Ford is a 40 y.o. female.   HPI Patient comes in with the above complaint.  States that she has a hx of anxiety.  She has type 1 DM and states that she had been out of her insulin and test strips for several weeks.  Just had insulin refilled last Friday but has not been able to check blood sugars.  Has been feeling "jittery" and anxious since resuming insulin and gets this way after she does her injection.  Denies cp, sob, fever, chills, abd pain, lightheadedness.  States that she needs a refill of test strips and phenergan.  Has appt with PCP in a couple of weeks.  Past Medical History:  Diagnosis Date  . Allergic rhinitis   . Arthritis   . Asthma    as child  . Chronic back pain   . Cigarette nicotine dependence with withdrawal   . DVT (deep venous thrombosis) ([place])   . Gallstones    s/p cholecystectomy  . GERD (gastroesophageal reflux disease)   . HIV (human immunodeficiency virus infection) ([place])   . HTN (hypertension)   . Meningioma ([place])   . Sciatic pain   . Seizure disorder, grand mal ([place])    dx 2005  . Seizures ([place])    due to head trauma as adult    Patient Active Problem List   Diagnosis Date Noted  . 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 Chest pain 12/10/2014  . Chest wall pain 12/10/2014  . Left leg pain 12/10/2014  . Left knee pain 12/10/2014  . Nausea vomiting and diarrhea 12/10/2014  . Seizures ([place]) 12/10/2014  . Seizure ([place]) 12/10/2014  . Hyperkalemia 12/10/2014  . HIV disease ([place]) 05/11/2014  . Meningioma ([place]) 05/11/2014  . Major depression, recurrent, chronic ([place]) 05/11/2014  . Morbid obesity ([place]) 08/10/2013  . DVT, popliteal, acute, left 10/06/2012  . 58mm Pulmonary nodule,  right upper lobe.  repeat CT chest May 2015. 10/06/2012  . Asthma 10/06/2012  . Hypertension 10/06/2012  . GERD (gastroesophageal reflux disease) 10/06/2012  . Cigarette nicotine dependence with withdrawal 10/06/2012  . Seizure disorder, grand mal ([place]) 09/25/2012  . Brain tumor ([place]) 09/25/2012    Past Surgical History:  Procedure Laterality Date  . BRAIN SURGERY     2013 to remove a meningioma  . CHOLECYSTECTOMY    . PERIPHERAL VASCULAR CATHETERIZATION N/A 10/31/2015   Procedure: IVC Filter Removal;  Surgeon: Adrian Prows, MD;  Location: [facility];  Service: Cardiovascular;  Laterality: N/A;  . PERIPHERAL VASCULAR CATHETERIZATION  10/31/2015   Procedure: IVC/SVC Venography;  Surgeon: Adrian Prows, MD;  Location: [facility];  Service: Cardiovascular;;    OB History    Gravida Para Term Preterm AB Living   3 1 1   2 1    SAB TAB Ectopic Multiple Live Births     2             Home Medications    Prior to Admission medications   Medication Sig Start Date End Date Taking? Authorizing Provider  albuterol (PROVENTIL HFA;VENTOLIN HFA) 108 (90 BASE) MCG/ACT inhaler Inhale 2 puffs into the lungs every 4 (four) hours as needed  for wheezing or shortness of breath. 08/02/14   Britt Bottom, NP  albuterol (PROVENTIL) (2.5 MG/3ML) 0.083% nebulizer solution Take 2.5 mg by nebulization every 6 (six) hours as needed for wheezing or shortness of breath.    Historical Provider, MD  ALPRAZolam Duanne Moron) 1 MG tablet Take 2 tablets (2 mg total) by mouth 3 (three) times daily as needed for anxiety. 12/30/14   Nicole Pisciotta, PA-C  ALPRAZolam Duanne Moron) 1 MG tablet Take 1 tablet (1 mg total) by mouth 3 (three) times daily as needed for anxiety. 11/03/15   Elnora Morrison, MD  amphetamine-dextroamphetamine (ADDERALL XR) 30 MG 24 hr capsule Take 30 mg by mouth 2 (two) times daily.    Historical Provider, MD  beclomethasone (QVAR) 40 MCG/ACT inhaler Inhale 2 puffs into the lungs 2 (two) times daily  as needed (for shortness of breath).     Historical Provider, MD  cyclobenzaprine (FLEXERIL) 10 MG tablet Take 1 tablet (10 mg total) by mouth 2 (two) times daily as needed for muscle spasms. 02/01/13   Larence Penning, MD  divalproex (DEPAKOTE ER) 500 MG 24 hr tablet TAKE 6 TABLETS (3,000 MG TOTAL) BY MOUTH DAILY. 11/06/15   Pieter Partridge, DO  folic acid (FOLVITE) 1 MG tablet Take 1 mg by mouth daily. Reported on 09/06/2015    Historical Provider, MD  gabapentin (NEURONTIN) 400 MG capsule Take 800 mg by mouth 3 (three) times daily.     Historical Provider, MD  glucose blood test strip Use as instructed 12/10/15   Lanae Crumbly, PA-C  hydrochlorothiazide (HYDRODIURIL) 25 MG tablet Take 1 tablet (25 mg total) by mouth daily. 07/09/13   Harvie Heck, PA-C  HYDROcodone-acetaminophen (NORCO) 10-325 MG tablet Take 1 tablet by mouth every 6 (six) hours as needed. 12/03/15   Konrad Felix, PA  insulin glargine (LANTUS) 100 UNIT/ML injection Inject 10 Units into the skin daily.    Historical Provider, MD  levETIRAcetam (KEPPRA) 500 MG tablet Take 2 tablets (1,000 mg total) by mouth 2 (two) times daily. 12/30/14   Nicole Pisciotta, PA-C  loratadine (CLARITIN) 10 MG tablet Take 10 mg by mouth daily.  03/03/14   Historical Provider, MD  Mefenamic Acid 250 MG CAPS Take 2 capsules (500 mg total) by mouth 3 (three) times daily. X 4 days 08/30/14   Mercedes Camprubi-Soms, PA-C  meloxicam (MOBIC) 15 MG tablet Take 1 tablet (15 mg total) by mouth daily. 12/07/14   Tiffany Carlota Raspberry, PA-C  metroNIDAZOLE (METROGEL VAGINAL) 0.75 % vaginal gel Place 1 Applicatorful vaginally 2 (two) times daily. X 5 days 08/30/14   Mercedes Camprubi-Soms, PA-C  omeprazole (PRILOSEC) 20 MG capsule Take 20 mg by mouth 2 (two) times daily.    Historical Provider, MD  ondansetron (ZOFRAN ODT) 4 MG disintegrating tablet Take 1 tablet (4 mg total) by mouth every 8 (eight) hours as needed for nausea or vomiting. 03/30/15   Nona Dell, PA-C    oxyCODONE-acetaminophen (PERCOCET) 10-325 MG per tablet Take 1 tablet by mouth every 4 (four) hours as needed for pain.    Historical Provider, MD  oxyCODONE-acetaminophen (PERCOCET) 10-325 MG tablet Take 1 tablet by mouth every 4 (four) hours as needed for pain. 10/31/15   Adrian Prows, MD  oxymetazoline (AFRIN NASAL SPRAY) 0.05 % nasal spray Place 1 spray into both nostrils 2 (two) times daily. 03/30/15   Nona Dell, PA-C  PARoxetine (PAXIL) 40 MG tablet Take 1 tablet (40 mg total) by mouth daily. 07/09/13   Harvie Heck,  PA-C  promethazine (PHENERGAN) 6.25 MG/5ML syrup Take 10 mLs (12.5 mg total) by mouth every 8 (eight) hours as needed for nausea or vomiting. 12/10/15   Lanae Crumbly, PA-C  propranolol (INDERAL) 10 MG tablet Take 10 mg by mouth 2 (two) times daily.  01/28/14   Historical Provider, MD  sitaGLIPtin (JANUVIA) 50 MG tablet Take 50 mg by mouth daily.    Historical Provider, MD  sodium chloride (OCEAN) 0.65 % nasal spray Place 1 spray into the nose 2 (two) times daily as needed for congestion.     Historical Provider, MD  triamcinolone ointment (KENALOG) 0.5 % Apply 1 application topically 2 (two) times daily.    Historical Provider, MD  ZOLMitriptan (ZOMIG) 2.5 MG tablet Take 1 tablet by mouth 4 (four) times daily. 03/25/15   Historical Provider, MD    Family History Family History  Problem Relation Age of Onset  . Other Mother     varicose vein  . Asthma Mother   . High blood pressure Mother   . Cancer Father   . Cancer    . Diabetes    . High blood pressure    . Asthma    . Thyroid disease    . Deep vein thrombosis Neg Hx   . Pulmonary embolism Neg Hx     Social History Social History  Substance Use Topics  . Smoking status: Current Every Day Smoker    Packs/day: 0.25    Years: 18.00    Types: Cigarettes  . Smokeless tobacco: Never Used  . Alcohol use No     Allergies   Ibuprofen; Clindamycin/lincomycin; Coconut flavor; Doxycycline hyclate; Flagyl  [metronidazole]; Latex; Other; and Aspirin   Review of Systems Review of Systems  Constitutional: Negative.   HENT: Negative.   Eyes: Negative.   Respiratory: Negative.   Cardiovascular: Negative.   Gastrointestinal: Negative.   Psychiatric/Behavioral: Positive for agitation. Negative for self-injury. The patient is nervous/anxious.      Physical Exam Triage Vital Signs ED Triage Vitals  Enc Vitals Group     BP      Pulse      Resp      Temp      Temp src      SpO2      Weight      Height      Head Circumference      Peak Flow      Pain Score      Pain Loc      Pain Edu?      Excl. in [place]?    No data found.   Updated Vital Signs LMP 11/20/2015   Visual Acuity Right Eye Distance:   Left Eye Distance:   Bilateral Distance:    Right Eye Near:   Left Eye Near:    Bilateral Near:     Physical Exam  Constitutional: She is oriented to person, place, and time. No distress.  HENT:  Head: Atraumatic.  Nose: Nose normal.  Eyes: EOM are normal. Pupils are equal, round, and reactive to light.  Neck: Normal range of motion.  Cardiovascular: Normal rate and regular rhythm.   Pulmonary/Chest: Effort normal and breath sounds normal. No respiratory distress.  Abdominal: She exhibits no distension.  Musculoskeletal: Normal range of motion.  Neurological: She is alert and oriented to person, place, and time.  Skin: Skin is warm and dry.  Psychiatric: She has a normal mood and affect. Her behavior is normal.  UC Treatments / Results  Labs (all labs ordered are listed, but only abnormal results are displayed) Labs Reviewed  CBC    EKG  EKG Interpretation None       Radiology No results found.  Procedures Procedures (including critical care time)  Medications Ordered in UC Medications - No data to display   Initial Impression / Assessment and Plan / UC Course  I have reviewed the triage vital signs and the nursing notes.  Pertinent labs &  imaging results that were available during my care of the patient were reviewed by me and considered in my medical decision making (see chart for details).  Clinical Course     Final Clinical Impressions(s) / UC Diagnoses   Final diagnoses:  Anxiety  question increased symptoms related to poor diabetes control  New Prescriptions Current Discharge Medication List    patient was given a scripts for glucose test strips and phenergan.  Keep appointment with PCP as scheduled.  Recommend that she check blood sugars at least 2-3 times daily.  We attempted multiple times to get an ISTAT 8 but was unsuccessful with the blood draw due to tiny rolling veins.  If anxiety symptoms worsen she will go to the ED. All questions answered.    Lanae Crumbly, PA-C 12/10/15 1801

## 2015-12-10 NOTE — Telephone Encounter (Signed)
This is NOT a new diagnosis. I believe she has refused bridge counselors phone calls etc

## 2015-12-11 ENCOUNTER — Ambulatory Visit: Payer: Medicaid Other | Admitting: Infectious Disease

## 2015-12-12 NOTE — Telephone Encounter (Signed)
Patient no-showed, although she is the one who requested the appointment day/time.

## 2015-12-26 MED FILL — ACCU-CHEK SOFTCLIX LANCETS: 25 days supply | Qty: 100 | Fill #1

## 2016-01-01 MED FILL — PROMETHAZINE-DM SYRUP: 6.25-15 | 20 days supply | Qty: 200 | Fill #0

## 2016-01-19 ENCOUNTER — Other Ambulatory Visit: Payer: Self-pay | Admitting: Neurology

## 2016-01-22 ENCOUNTER — Encounter (HOSPITAL_COMMUNITY): Payer: Self-pay | Admitting: Family Medicine

## 2016-01-22 ENCOUNTER — Ambulatory Visit (HOSPITAL_COMMUNITY)
Admission: EM | Admit: 2016-01-22 | Discharge: 2016-01-22 | Disposition: A | Discharge status: Home / Self Care | Payer: Medicaid Other | Attending: Internal Medicine | Admitting: Internal Medicine

## 2016-01-22 DIAGNOSIS — K2100 Gastro-esophageal reflux disease with esophagitis, without bleeding: Secondary | ICD-10-CM

## 2016-01-22 DIAGNOSIS — J40 Bronchitis, not specified as acute or chronic: Secondary | ICD-10-CM | POA: Diagnosis not present

## 2016-01-22 MED ORDER — PROMETHAZINE HCL 6.25 MG/5ML PO SYRP: 12.5000 mg | ORAL_SOLUTION | Freq: Three times a day (TID) | ORAL | 2 refills | Status: DC | PRN

## 2016-01-22 MED ORDER — OMEPRAZOLE 20 MG PO CPDR: 20.0000 mg | DELAYED_RELEASE_CAPSULE | Freq: Two times a day (BID) | ORAL | 2 refills | Status: DC
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
## 2016-01-22 NOTE — Telephone Encounter (Signed)
1.  Continue Keppra 1000mg  twice daily and Depakote 1000mg  three times daily    I would like to switch the Depakote to Depakote ER 3000mg  once daily and have her come to the office

## 2016-01-24 ENCOUNTER — Ambulatory Visit: Payer: Self-pay | Admitting: Obstetrics and Gynecology

## 2016-01-25 ENCOUNTER — Telehealth: Payer: Self-pay

## 2016-01-25 NOTE — ED Provider Notes (Signed)
CSN: [id number]     Arrival date & time 01/22/16  1245 History   First MD Initiated Contact with Patient 01/22/16 1519     Chief Complaint  Patient presents with  . Heartburn  . Cough   (Consider location/radiation/quality/duration/timing/severity/associated sxs/prior Treatment) HPI 40 y/o female with 3 day hx of heart burn with coughing. Cough has been present for a couple of weeks, dry non productive, without fever. Hx of bronchitis smoker Past Medical History:  Diagnosis Date  . Allergic rhinitis   . Arthritis   . Asthma    as child  . Chronic back pain   . Cigarette nicotine dependence with withdrawal   . DVT (deep venous thrombosis) ([place])   . Gallstones    s/p cholecystectomy  . GERD (gastroesophageal reflux disease)   . HIV (human immunodeficiency virus infection) ([place])   . HTN (hypertension)   . Meningioma ([place])   . Sciatic pain   . Seizure disorder, grand mal ([place])    dx 2005  . Seizures ([place])    due to head trauma as adult   Past Surgical History:  Procedure Laterality Date  . BRAIN SURGERY     2013 to remove a meningioma  . CHOLECYSTECTOMY    . PERIPHERAL VASCULAR CATHETERIZATION N/A 10/31/2015   Procedure: IVC Filter Removal;  Surgeon: Adrian Prows, MD;  Location: [facility];  Service: Cardiovascular;  Laterality: N/A;  . PERIPHERAL VASCULAR CATHETERIZATION  10/31/2015   Procedure: IVC/SVC Venography;  Surgeon: Adrian Prows, MD;  Location: [facility];  Service: Cardiovascular;;   Family History  Problem Relation Age of Onset  . Other Mother     varicose vein  . Asthma Mother   . High blood pressure Mother   . Cancer Father   . Cancer    . Diabetes    . High blood pressure    . Asthma    . Thyroid disease    . Deep vein thrombosis Neg Hx   . Pulmonary embolism Neg Hx    Social History  Substance Use Topics  . Smoking status: Current Every Day Smoker    Packs/day: 0.25    Years: 18.00    Types: Cigarettes  . Smokeless tobacco: Never Used   . Alcohol use No   OB History    Gravida Para Term Preterm AB Living   3 1 1   2 1    SAB TAB Ectopic Multiple Live Births     2           Review of Systems  Denies: HEADACHE, NAUSEA, ABDOMINAL PAIN, CHEST PAIN, CONGESTION, DYSURIA, SHORTNESS OF BREATH  Allergies  Ibuprofen; Clindamycin/lincomycin; Coconut flavor; Doxycycline hyclate; Flagyl [metronidazole]; Latex; Other; and Aspirin  Home Medications   Prior to Admission medications   Medication Sig Start Date End Date Taking? Authorizing Provider  albuterol (PROVENTIL HFA;VENTOLIN HFA) 108 (90 BASE) MCG/ACT inhaler Inhale 2 puffs into the lungs every 4 (four) hours as needed for wheezing or shortness of breath. 08/02/14   Britt Bottom, NP  albuterol (PROVENTIL) (2.5 MG/3ML) 0.083% nebulizer solution Take 2.5 mg by nebulization every 6 (six) hours as needed for wheezing or shortness of breath.    Historical Provider, MD  ALPRAZolam Duanne Moron) 1 MG tablet Take 2 tablets (2 mg total) by mouth 3 (three) times daily as needed for anxiety. 12/30/14   Nicole Pisciotta, PA-C  ALPRAZolam Duanne Moron) 1 MG tablet Take 1 tablet (1 mg total) by mouth 3 (three) times daily as  needed for anxiety. 11/03/15   Elnora Morrison, MD  amphetamine-dextroamphetamine (ADDERALL XR) 30 MG 24 hr capsule Take 30 mg by mouth 2 (two) times daily.    Historical Provider, MD  beclomethasone (QVAR) 40 MCG/ACT inhaler Inhale 2 puffs into the lungs 2 (two) times daily as needed (for shortness of breath).     Historical Provider, MD  cyclobenzaprine (FLEXERIL) 10 MG tablet Take 1 tablet (10 mg total) by mouth 2 (two) times daily as needed for muscle spasms. 02/01/13   Larence Penning, MD  divalproex (DEPAKOTE ER) 500 MG 24 hr tablet Take 6 tablets (3,000 mg total) by mouth daily. 01/22/16   Pieter Partridge, DO  folic acid (FOLVITE) 1 MG tablet Take 1 mg by mouth daily. Reported on 09/06/2015    Historical Provider, MD  gabapentin (NEURONTIN) 400 MG capsule Take 800 mg by mouth 3 (three)  times daily.     Historical Provider, MD  glucose blood test strip Use as instructed 12/10/15   Lanae Crumbly, PA-C  hydrochlorothiazide (HYDRODIURIL) 25 MG tablet Take 1 tablet (25 mg total) by mouth daily. 07/09/13   Harvie Heck, PA-C  HYDROcodone-acetaminophen (NORCO) 10-325 MG tablet Take 1 tablet by mouth every 6 (six) hours as needed. 12/03/15   Konrad Felix, PA  insulin glargine (LANTUS) 100 UNIT/ML injection Inject 10 Units into the skin daily.    Historical Provider, MD  levETIRAcetam (KEPPRA) 500 MG tablet Take 2 tablets (1,000 mg total) by mouth 2 (two) times daily. 12/30/14   Nicole Pisciotta, PA-C  loratadine (CLARITIN) 10 MG tablet Take 10 mg by mouth daily.  03/03/14   Historical Provider, MD  Mefenamic Acid 250 MG CAPS Take 2 capsules (500 mg total) by mouth 3 (three) times daily. X 4 days 08/30/14   Mercedes Camprubi-Soms, PA-C  meloxicam (MOBIC) 15 MG tablet Take 1 tablet (15 mg total) by mouth daily. 12/07/14   Tiffany Carlota Raspberry, PA-C  metroNIDAZOLE (METROGEL VAGINAL) 0.75 % vaginal gel Place 1 Applicatorful vaginally 2 (two) times daily. X 5 days 08/30/14   Mercedes Camprubi-Soms, PA-C  omeprazole (PRILOSEC) 20 MG capsule Take 1 capsule (20 mg total) by mouth 2 (two) times daily. 01/22/16   Konrad Felix, PA  ondansetron (ZOFRAN ODT) 4 MG disintegrating tablet Take 1 tablet (4 mg total) by mouth every 8 (eight) hours as needed for nausea or vomiting. 03/30/15   Nona Dell, PA-C  oxyCODONE-acetaminophen (PERCOCET) 10-325 MG per tablet Take 1 tablet by mouth every 4 (four) hours as needed for pain.    Historical Provider, MD  oxyCODONE-acetaminophen (PERCOCET) 10-325 MG tablet Take 1 tablet by mouth every 4 (four) hours as needed for pain. 10/31/15   Adrian Prows, MD  oxymetazoline (AFRIN NASAL SPRAY) 0.05 % nasal spray Place 1 spray into both nostrils 2 (two) times daily. 03/30/15   Nona Dell, PA-C  PARoxetine (PAXIL) 40 MG tablet Take 1 tablet (40 mg total) by  mouth daily. 07/09/13   Harvie Heck, PA-C  promethazine (PHENERGAN) 6.25 MG/5ML syrup Take 10 mLs (12.5 mg total) by mouth every 8 (eight) hours as needed for nausea or vomiting. 01/22/16   Konrad Felix, PA  propranolol (INDERAL) 10 MG tablet Take 10 mg by mouth 2 (two) times daily.  01/28/14   Historical Provider, MD  sitaGLIPtin (JANUVIA) 50 MG tablet Take 50 mg by mouth daily.    Historical Provider, MD  sodium chloride (OCEAN) 0.65 % nasal spray Place 1 spray into the nose 2 (  two) times daily as needed for congestion.     Historical Provider, MD  triamcinolone ointment (KENALOG) 0.5 % Apply 1 application topically 2 (two) times daily.    Historical Provider, MD  ZOLMitriptan (ZOMIG) 2.5 MG tablet Take 1 tablet by mouth 4 (four) times daily. 03/25/15   Historical Provider, MD   Meds Ordered and Administered this Visit  Medications - No data to display  BP 144/86   Pulse 90   Temp 98.4 F (36.9 C) (Oral)   Resp 16   LMP 01/17/2016   SpO2 100%  No data found.   Physical Exam NURSES NOTES AND VITAL SIGNS REVIEWED. CONSTITUTIONAL: Well developed, well nourished, no acute distress HEENT: normocephalic, atraumatic EYES: Conjunctiva normal NECK:normal ROM, supple, no adenopathy PULMONARY:No respiratory distress, normal effort ABDOMINAL: Soft, ND, NT BS+, No CVAT MUSCULOSKELETAL: Normal ROM of all extremities,  SKIN: warm and dry without rash PSYCHIATRIC: Mood and affect, behavior are normal  Urgent Care Course   Clinical Course    Procedures (including critical care time)  Labs Review Labs Reviewed - No data to display  Imaging Review No results found.   Visual Acuity Review  Right Eye Distance:   Left Eye Distance:   Bilateral Distance:    Right Eye Near:   Left Eye Near:    Bilateral Near:         MDM   1. Bronchitis   2. GERD with esophagitis     Patient is reassured that there are no issues that require transfer to higher level of care at this time  or additional tests. Patient is advised to continue home symptomatic treatment. Patient is advised that if there are new or worsening symptoms to attend the emergency department, contact primary care provider, or return to UC. Instructions of care provided discharged home in stable condition.    THIS NOTE WAS GENERATED USING A VOICE RECOGNITION SOFTWARE PROGRAM. ALL REASONABLE EFFORTS  WERE MADE TO PROOFREAD THIS DOCUMENT FOR ACCURACY.  I have verbally reviewed the discharge instructions with the patient. A printed AVS was given to the patient.  All questions were answered prior to discharge.      Konrad Felix, PA 01/25/16 0930

## 2016-01-25 NOTE — Telephone Encounter (Signed)
Received request for records from [employer]-Disability determination. Pt never been seen before at [facility]).

## 2016-01-30 ENCOUNTER — Ambulatory Visit: Payer: Medicaid Other | Admitting: Neurology

## 2016-02-09 ENCOUNTER — Ambulatory Visit (HOSPITAL_COMMUNITY)
Admission: EM | Admit: 2016-02-09 | Discharge: 2016-02-09 | Disposition: A | Discharge status: Home / Self Care | Payer: Medicaid Other | Attending: Internal Medicine | Admitting: Internal Medicine

## 2016-02-09 ENCOUNTER — Encounter (HOSPITAL_COMMUNITY): Payer: Self-pay | Admitting: Family Medicine

## 2016-02-09 DIAGNOSIS — J209 Acute bronchitis, unspecified: Secondary | ICD-10-CM | POA: Diagnosis not present

## 2016-02-09 MED ORDER — HYDROCODONE-ACETAMINOPHEN 7.5-325 MG/15ML PO SOLN: 10.0000 mL | Freq: Four times a day (QID) | ORAL | 0 refills | Status: DC | PRN

## 2016-02-09 NOTE — Discharge Instructions (Signed)
YOU NEED TO TAKE YOUR ASTHMA MEDICATIONS MORE FREQUENTLY.  YOU NEED TO SEE YOUR DOCTOR FOR FOLLOW UP\  CONTINUE PREDNISONE AS DIRECTED THERE IS NO INDICATION FOR ADDITIONAL ANTIBIOTICS  YOUR COUGH MAY LINGER FOR UP TO 6 WEEKS

## 2016-02-09 NOTE — ED Provider Notes (Signed)
CSN: [id number]     Arrival date & time 02/09/16  1551 History   First MD Initiated Contact with Patient 02/09/16 1652     Chief Complaint  Patient presents with  . Cough  . Nasal Congestion   (Consider location/radiation/quality/duration/timing/severity/associated sxs/prior Treatment) HPI 40 Y/O FEMALE WITH BRONCHITIS. CONTINUES TO COUGH AND WHEEZE. REQUESTING COUGH MEDS.  HAS USED NEB AND INHALER AT HOME.  Past Medical History:  Diagnosis Date  . Allergic rhinitis   . Arthritis   . Asthma    as child  . Chronic back pain   . Cigarette nicotine dependence with withdrawal   . DVT (deep venous thrombosis) ([place])   . Gallstones    s/p cholecystectomy  . GERD (gastroesophageal reflux disease)   . HIV (human immunodeficiency virus infection) ([place])   . HTN (hypertension)   . Meningioma ([place])   . Sciatic pain   . Seizure disorder, grand mal ([place])    dx 2005  . Seizures ([place])    due to head trauma as adult   Past Surgical History:  Procedure Laterality Date  . BRAIN SURGERY     2013 to remove a meningioma  . CHOLECYSTECTOMY    . PERIPHERAL VASCULAR CATHETERIZATION N/A 10/31/2015   Procedure: IVC Filter Removal;  Surgeon: Adrian Prows, MD;  Location: [facility];  Service: Cardiovascular;  Laterality: N/A;  . PERIPHERAL VASCULAR CATHETERIZATION  10/31/2015   Procedure: IVC/SVC Venography;  Surgeon: Adrian Prows, MD;  Location: [facility];  Service: Cardiovascular;;   Family History  Problem Relation Age of Onset  . Other Mother     varicose vein  . Asthma Mother   . High blood pressure Mother   . Cancer Father   . Cancer    . Diabetes    . High blood pressure    . Asthma    . Thyroid disease    . Deep vein thrombosis Neg Hx   . Pulmonary embolism Neg Hx    Social History  Substance Use Topics  . Smoking status: Current Every Day Smoker    Packs/day: 0.25    Years: 18.00    Types: Cigarettes  . Smokeless tobacco: Never Used  . Alcohol use No   OB  History    Gravida Para Term Preterm AB Living   3 1 1   2 1    SAB TAB Ectopic Multiple Live Births     2           Review of Systems  Denies: HEADACHE, NAUSEA, ABDOMINAL PAIN, CHEST PAIN, CONGESTION, DYSURIA, SHORTNESS OF BREATH  Allergies  Ibuprofen; Clindamycin/lincomycin; Coconut flavor; Doxycycline hyclate; Flagyl [metronidazole]; Latex; Other; and Aspirin  Home Medications   Prior to Admission medications   Medication Sig Start Date End Date Taking? Authorizing Provider  albuterol (PROVENTIL HFA;VENTOLIN HFA) 108 (90 BASE) MCG/ACT inhaler Inhale 2 puffs into the lungs every 4 (four) hours as needed for wheezing or shortness of breath. 08/02/14   Britt Bottom, NP  albuterol (PROVENTIL) (2.5 MG/3ML) 0.083% nebulizer solution Take 2.5 mg by nebulization every 6 (six) hours as needed for wheezing or shortness of breath.    Historical Provider, MD  ALPRAZolam Duanne Moron) 1 MG tablet Take 2 tablets (2 mg total) by mouth 3 (three) times daily as needed for anxiety. 12/30/14   Nicole Pisciotta, PA-C  ALPRAZolam Duanne Moron) 1 MG tablet Take 1 tablet (1 mg total) by mouth 3 (three) times daily as needed for anxiety. 11/03/15   Vonna Kotyk  Zavitz, MD  amphetamine-dextroamphetamine (ADDERALL XR) 30 MG 24 hr capsule Take 30 mg by mouth 2 (two) times daily.    Historical Provider, MD  beclomethasone (QVAR) 40 MCG/ACT inhaler Inhale 2 puffs into the lungs 2 (two) times daily as needed (for shortness of breath).     Historical Provider, MD  cyclobenzaprine (FLEXERIL) 10 MG tablet Take 1 tablet (10 mg total) by mouth 2 (two) times daily as needed for muscle spasms. 02/01/13   Larence Penning, MD  divalproex (DEPAKOTE ER) 500 MG 24 hr tablet Take 6 tablets (3,000 mg total) by mouth daily. 01/22/16   Pieter Partridge, DO  folic acid (FOLVITE) 1 MG tablet Take 1 mg by mouth daily. Reported on 09/06/2015    Historical Provider, MD  gabapentin (NEURONTIN) 400 MG capsule Take 800 mg by mouth 3 (three) times daily.      Historical Provider, MD  glucose blood test strip Use as instructed 12/10/15   Lanae Crumbly, PA-C  hydrochlorothiazide (HYDRODIURIL) 25 MG tablet Take 1 tablet (25 mg total) by mouth daily. 07/09/13   Harvie Heck, PA-C  HYDROcodone-acetaminophen (HYCET) 7.5-325 mg/15 ml solution Take 10 mLs by mouth 4 (four) times daily as needed for moderate pain. 02/09/16   Konrad Felix, PA  HYDROcodone-acetaminophen (NORCO) 10-325 MG tablet Take 1 tablet by mouth every 6 (six) hours as needed. 12/03/15   Konrad Felix, PA  insulin glargine (LANTUS) 100 UNIT/ML injection Inject 10 Units into the skin daily.    Historical Provider, MD  levETIRAcetam (KEPPRA) 500 MG tablet Take 2 tablets (1,000 mg total) by mouth 2 (two) times daily. 12/30/14   Nicole Pisciotta, PA-C  loratadine (CLARITIN) 10 MG tablet Take 10 mg by mouth daily.  03/03/14   Historical Provider, MD  Mefenamic Acid 250 MG CAPS Take 2 capsules (500 mg total) by mouth 3 (three) times daily. X 4 days 08/30/14   Mercedes Camprubi-Soms, PA-C  meloxicam (MOBIC) 15 MG tablet Take 1 tablet (15 mg total) by mouth daily. 12/07/14   Tiffany Carlota Raspberry, PA-C  metroNIDAZOLE (METROGEL VAGINAL) 0.75 % vaginal gel Place 1 Applicatorful vaginally 2 (two) times daily. X 5 days 08/30/14   Mercedes Camprubi-Soms, PA-C  omeprazole (PRILOSEC) 20 MG capsule Take 1 capsule (20 mg total) by mouth 2 (two) times daily. 01/22/16   Konrad Felix, PA  ondansetron (ZOFRAN ODT) 4 MG disintegrating tablet Take 1 tablet (4 mg total) by mouth every 8 (eight) hours as needed for nausea or vomiting. 03/30/15   Nona Dell, PA-C  oxyCODONE-acetaminophen (PERCOCET) 10-325 MG per tablet Take 1 tablet by mouth every 4 (four) hours as needed for pain.    Historical Provider, MD  oxyCODONE-acetaminophen (PERCOCET) 10-325 MG tablet Take 1 tablet by mouth every 4 (four) hours as needed for pain. 10/31/15   Adrian Prows, MD  oxymetazoline (AFRIN NASAL SPRAY) 0.05 % nasal spray Place 1 spray  into both nostrils 2 (two) times daily. 03/30/15   Nona Dell, PA-C  PARoxetine (PAXIL) 40 MG tablet Take 1 tablet (40 mg total) by mouth daily. 07/09/13   Harvie Heck, PA-C  promethazine (PHENERGAN) 6.25 MG/5ML syrup Take 10 mLs (12.5 mg total) by mouth every 8 (eight) hours as needed for nausea or vomiting. 01/22/16   Konrad Felix, PA  propranolol (INDERAL) 10 MG tablet Take 10 mg by mouth 2 (two) times daily.  01/28/14   Historical Provider, MD  sitaGLIPtin (JANUVIA) 50 MG tablet Take 50 mg by mouth daily.  Historical Provider, MD  sodium chloride (OCEAN) 0.65 % nasal spray Place 1 spray into the nose 2 (two) times daily as needed for congestion.     Historical Provider, MD  triamcinolone ointment (KENALOG) 0.5 % Apply 1 application topically 2 (two) times daily.    Historical Provider, MD  ZOLMitriptan (ZOMIG) 2.5 MG tablet Take 1 tablet by mouth 4 (four) times daily. 03/25/15   Historical Provider, MD   Meds Ordered and Administered this Visit  Medications - No data to display  BP 124/84   Pulse 103   Temp 98.7 F (37.1 C)   Resp 18   LMP 01/17/2016   SpO2 98%  No data found.   Physical Exam NURSES NOTES AND VITAL SIGNS REVIEWED. CONSTITUTIONAL: Well developed, well nourished, no acute distress HEENT: normocephalic, atraumatic EYES: Conjunctiva normal NECK:normal ROM, supple, no adenopathy PULMONARY:No respiratory distress, normal effort, WHEEZING THROUGHOUT LUNG FIELDS, WITHOUT CONSOLIDATION. ABDOMINAL: Soft, ND, NT BS+, No CVAT MUSCULOSKELETAL: Normal ROM of all extremities,  SKIN: warm and dry without rash PSYCHIATRIC: Mood and affect, behavior are normal  Urgent Care Course   Clinical Course    Procedures (including critical care time)  Labs Review Labs Reviewed - No data to display  Imaging Review No results found.   Visual Acuity Review  Right Eye Distance:   Left Eye Distance:   Bilateral Distance:    Right Eye Near:   Left Eye  Near:    Bilateral Near:       DISCUSSED NEED FOR TREATMENT, PT DECLINES NEBULIZER STATES SHE HAS THE MEDS AT HOME AND DOES NOT WANT TO PAY FOR THE TREATMENT HERE.   MDM   1. Acute bronchitis, unspecified organism     Patient is reassured that there are no issues that require transfer to higher level of care at this time or additional tests. Patient is advised to continue home symptomatic treatment. Patient is advised that if there are new or worsening symptoms to attend the emergency department, contact primary care provider, or return to UC. Instructions of care provided discharged home in stable condition.    THIS NOTE WAS GENERATED USING A VOICE RECOGNITION SOFTWARE PROGRAM. ALL REASONABLE EFFORTS  WERE MADE TO PROOFREAD THIS DOCUMENT FOR ACCURACY.  I have verbally reviewed the discharge instructions with the patient. A printed AVS was given to the patient.  All questions were answered prior to discharge.      Konrad Felix, [place] 02/09/16 1919

## 2016-02-16 ENCOUNTER — Encounter: Payer: Self-pay | Admitting: Neurology

## 2016-02-16 ENCOUNTER — Telehealth: Payer: Self-pay | Admitting: Neurology

## 2016-02-16 NOTE — Telephone Encounter (Signed)
Patient dismissed from [facility] by Metta Clines Do , effective February 16, 2016. Dismissal letter sent out by certified / registered mail.  DAJ

## 2016-03-04 MED FILL — ACCU-CHEK AVIVA PLUS TEST S: 25 days supply | Qty: 100 | Fill #0

## 2016-03-04 MED FILL — ACCU-CHEK SOFTCLIX LANCETS: 25 days supply | Qty: 100 | Fill #2

## 2016-03-13 NOTE — Telephone Encounter (Signed)
Certified dismissal letter returned as undeliverable, unclaimed, return to sender after three attempts by [employer] on March 13, 2018. Patient moved and address has not been updated in Epic.  DAJ

## 2016-03-16 ENCOUNTER — Other Ambulatory Visit (INDEPENDENT_AMBULATORY_CARE_PROVIDER_SITE_OTHER): Payer: Self-pay | Admitting: Surgery

## 2016-03-19 ENCOUNTER — Other Ambulatory Visit (INDEPENDENT_AMBULATORY_CARE_PROVIDER_SITE_OTHER): Payer: Self-pay | Admitting: Surgery

## 2016-03-29 MED FILL — ACCU-CHEK SOFTCLIX LANCETS: 25 days supply | Qty: 100 | Fill #3

## 2016-03-29 MED FILL — ACCU-CHEK AVIVA PLUS TEST S: 25 days supply | Qty: 100 | Fill #1

## 2016-04-15 ENCOUNTER — Encounter (HOSPITAL_COMMUNITY): Payer: Self-pay | Admitting: *Deleted

## 2016-04-15 ENCOUNTER — Emergency Department (HOSPITAL_COMMUNITY)
Admission: EM | Admit: 2016-04-15 | Discharge: 2016-04-15 | Disposition: A | Discharge status: Home / Self Care | Payer: Medicaid Other | Attending: Emergency Medicine | Admitting: Emergency Medicine

## 2016-04-15 DIAGNOSIS — J45909 Unspecified asthma, uncomplicated: Secondary | ICD-10-CM | POA: Insufficient documentation

## 2016-04-15 DIAGNOSIS — F17210 Nicotine dependence, cigarettes, uncomplicated: Secondary | ICD-10-CM | POA: Diagnosis not present

## 2016-04-15 DIAGNOSIS — R220 Localized swelling, mass and lump, head: Secondary | ICD-10-CM | POA: Diagnosis present

## 2016-04-15 DIAGNOSIS — Z79899 Other long term (current) drug therapy: Secondary | ICD-10-CM | POA: Diagnosis not present

## 2016-04-15 DIAGNOSIS — L720 Epidermal cyst: Secondary | ICD-10-CM | POA: Insufficient documentation

## 2016-04-15 DIAGNOSIS — Z91040 Latex allergy status: Secondary | ICD-10-CM | POA: Insufficient documentation

## 2016-04-15 DIAGNOSIS — R110 Nausea: Secondary | ICD-10-CM | POA: Diagnosis not present

## 2016-04-15 DIAGNOSIS — I10 Essential (primary) hypertension: Secondary | ICD-10-CM | POA: Insufficient documentation

## 2016-04-15 MED ORDER — PROMETHAZINE HCL 6.25 MG/5ML PO SYRP: 12.5000 mg | ORAL_SOLUTION | Freq: Three times a day (TID) | ORAL | 2 refills | Status: DC | PRN

## 2016-04-15 NOTE — ED Notes (Signed)
Pt stable, understands discharge instructions, and reasons for return.   

## 2016-04-15 NOTE — ED Provider Notes (Signed)
[facility] DEPT Provider Note   CSN: [id number] Arrival date & time: 04/15/16  1419   By signing my name below, I, Delton Prairie, attest that this documentation has been prepared under the direction and in the presence of  [employer], PA-C. Electronically Signed: Delton Prairie, ED Scribe. 04/15/16. 5:19 PM.   History   Chief Complaint Chief Complaint  Patient presents with  . Abscess   The history is provided by the patient. No language interpreter was used.   HPI Comments:  Lori Ford is a 41 y.o. female who presents to the Emergency Department complaining of sudden onset, moderate pain to the middle of her forehead due to a lump x 2 days. She states she has had the lump on her forehead for several years and notes it worsened in size about 1 month ago. No alleviating factors noted. Pt denies any other associated symptoms and modifying factors at this time.   Pt also present for a medication refill. She states she currently has ovarian cancer and experiences nausea on a daily basis but has run out of her medication. She notes she takes liquid promethazine.     Past Medical History:  Diagnosis Date  . Allergic rhinitis   . Arthritis   . Asthma    as child  . Chronic back pain   . Cigarette nicotine dependence with withdrawal   . DVT (deep venous thrombosis) ([place])   . Gallstones    s/p cholecystectomy  . GERD (gastroesophageal reflux disease)   . HIV (human immunodeficiency virus infection) ([place])   . HTN (hypertension)   . Meningioma ([place])   . Sciatic pain   . Seizure disorder, grand mal ([place])    dx 2005  . Seizures ([place])    due to head trauma as adult    Patient Active Problem List   Diagnosis Date Noted  . 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 Chest pain 12/10/2014  . Chest wall pain 12/10/2014  . Left leg pain 12/10/2014  . Left knee pain 12/10/2014  . Nausea vomiting and diarrhea 12/10/2014  . Seizures ([place]) 12/10/2014  . Seizure ([place]) 12/10/2014  .  Hyperkalemia 12/10/2014  . HIV disease ([place]) 05/11/2014  . Meningioma ([place]) 05/11/2014  . Major depression, recurrent, chronic ([place]) 05/11/2014  . Morbid obesity ([place]) 08/10/2013  . DVT, popliteal, acute, left 10/06/2012  . 70mm Pulmonary nodule, right upper lobe.  repeat CT chest May 2015. 10/06/2012  . Asthma 10/06/2012  . Hypertension 10/06/2012  . GERD (gastroesophageal reflux disease) 10/06/2012  . Cigarette nicotine dependence with withdrawal 10/06/2012  . Seizure disorder, grand mal ([place]) 09/25/2012  . Brain tumor ([place]) 09/25/2012    Past Surgical History:  Procedure Laterality Date  . BRAIN SURGERY     2013 to remove a meningioma  . CHOLECYSTECTOMY    . PERIPHERAL VASCULAR CATHETERIZATION N/A 10/31/2015   Procedure: IVC Filter Removal;  Surgeon: Adrian Prows, MD;  Location: [facility];  Service: Cardiovascular;  Laterality: N/A;  . PERIPHERAL VASCULAR CATHETERIZATION  10/31/2015   Procedure: IVC/SVC Venography;  Surgeon: Adrian Prows, MD;  Location: [facility];  Service: Cardiovascular;;    OB History    Gravida Para Term Preterm AB Living   3 1 1   2 1    SAB TAB Ectopic Multiple Live Births     2             Home Medications    Prior to Admission medications   Medication  Sig Start Date End Date Taking? Authorizing Provider  albuterol (PROVENTIL HFA;VENTOLIN HFA) 108 (90 BASE) MCG/ACT inhaler Inhale 2 puffs into the lungs every 4 (four) hours as needed for wheezing or shortness of breath. 08/02/14   Britt Bottom, NP  albuterol (PROVENTIL) (2.5 MG/3ML) 0.083% nebulizer solution Take 2.5 mg by nebulization every 6 (six) hours as needed for wheezing or shortness of breath.    Historical Provider, MD  ALPRAZolam Duanne Moron) 1 MG tablet Take 2 tablets (2 mg total) by mouth 3 (three) times daily as needed for anxiety. 12/30/14   Nicole Pisciotta, PA-C  ALPRAZolam Duanne Moron) 1 MG tablet Take 1 tablet (1 mg total) by mouth 3 (three) times daily as needed for anxiety.  11/03/15   Elnora Morrison, MD  amphetamine-dextroamphetamine (ADDERALL XR) 30 MG 24 hr capsule Take 30 mg by mouth 2 (two) times daily.    Historical Provider, MD  beclomethasone (QVAR) 40 MCG/ACT inhaler Inhale 2 puffs into the lungs 2 (two) times daily as needed (for shortness of breath).     Historical Provider, MD  cyclobenzaprine (FLEXERIL) 10 MG tablet Take 1 tablet (10 mg total) by mouth 2 (two) times daily as needed for muscle spasms. 02/01/13   Larence Penning, MD  divalproex (DEPAKOTE ER) 500 MG 24 hr tablet Take 6 tablets (3,000 mg total) by mouth daily. 01/22/16   Pieter Partridge, DO  folic acid (FOLVITE) 1 MG tablet Take 1 mg by mouth daily. Reported on 09/06/2015    Historical Provider, MD  gabapentin (NEURONTIN) 400 MG capsule Take 800 mg by mouth 3 (three) times daily.     Historical Provider, MD  glucose blood test strip Use as instructed 12/10/15   Lanae Crumbly, PA-C  hydrochlorothiazide (HYDRODIURIL) 25 MG tablet Take 1 tablet (25 mg total) by mouth daily. 07/09/13   Harvie Heck, PA-C  HYDROcodone-acetaminophen (HYCET) 7.5-325 mg/15 ml solution Take 10 mLs by mouth 4 (four) times daily as needed for moderate pain. 02/09/16   Konrad Felix, PA  HYDROcodone-acetaminophen (NORCO) 10-325 MG tablet Take 1 tablet by mouth every 6 (six) hours as needed. 12/03/15   Konrad Felix, PA  insulin glargine (LANTUS) 100 UNIT/ML injection Inject 10 Units into the skin daily.    Historical Provider, MD  levETIRAcetam (KEPPRA) 500 MG tablet Take 2 tablets (1,000 mg total) by mouth 2 (two) times daily. 12/30/14   Nicole Pisciotta, PA-C  loratadine (CLARITIN) 10 MG tablet Take 10 mg by mouth daily.  03/03/14   Historical Provider, MD  Mefenamic Acid 250 MG CAPS Take 2 capsules (500 mg total) by mouth 3 (three) times daily. X 4 days 08/30/14   Mercedes Camprubi-Soms, PA-C  meloxicam (MOBIC) 15 MG tablet Take 1 tablet (15 mg total) by mouth daily. 12/07/14   Tiffany Carlota Raspberry, PA-C  metroNIDAZOLE (METROGEL VAGINAL)  0.75 % vaginal gel Place 1 Applicatorful vaginally 2 (two) times daily. X 5 days 08/30/14   Mercedes Camprubi-Soms, PA-C  omeprazole (PRILOSEC) 20 MG capsule Take 1 capsule (20 mg total) by mouth 2 (two) times daily. 01/22/16   Konrad Felix, PA  ondansetron (ZOFRAN ODT) 4 MG disintegrating tablet Take 1 tablet (4 mg total) by mouth every 8 (eight) hours as needed for nausea or vomiting. 03/30/15   Nona Dell, PA-C  oxyCODONE-acetaminophen (PERCOCET) 10-325 MG per tablet Take 1 tablet by mouth every 4 (four) hours as needed for pain.    Historical Provider, MD  oxyCODONE-acetaminophen (PERCOCET) 10-325 MG tablet Take 1 tablet by  mouth every 4 (four) hours as needed for pain. 10/31/15   Adrian Prows, MD  oxymetazoline (AFRIN NASAL SPRAY) 0.05 % nasal spray Place 1 spray into both nostrils 2 (two) times daily. 03/30/15   Nona Dell, PA-C  PARoxetine (PAXIL) 40 MG tablet Take 1 tablet (40 mg total) by mouth daily. 07/09/13   Harvie Heck, PA-C  promethazine (PHENERGAN) 6.25 MG/5ML syrup Take 10 mLs (12.5 mg total) by mouth every 8 (eight) hours as needed for nausea or vomiting. 04/15/16   Okey Regal, PA-C  propranolol (INDERAL) 10 MG tablet Take 10 mg by mouth 2 (two) times daily.  01/28/14   Historical Provider, MD  sitaGLIPtin (JANUVIA) 50 MG tablet Take 50 mg by mouth daily.    Historical Provider, MD  sodium chloride (OCEAN) 0.65 % nasal spray Place 1 spray into the nose 2 (two) times daily as needed for congestion.     Historical Provider, MD  triamcinolone ointment (KENALOG) 0.5 % Apply 1 application topically 2 (two) times daily.    Historical Provider, MD  ZOLMitriptan (ZOMIG) 2.5 MG tablet Take 1 tablet by mouth 4 (four) times daily. 03/25/15   Historical Provider, MD    Family History Family History  Problem Relation Age of Onset  . Other Mother     varicose vein  . Asthma Mother   . High blood pressure Mother   . Cancer Father   . Cancer    . Diabetes    .  High blood pressure    . Asthma    . Thyroid disease    . Deep vein thrombosis Neg Hx   . Pulmonary embolism Neg Hx     Social History Social History  Substance Use Topics  . Smoking status: Current Every Day Smoker    Packs/day: 0.25    Years: 18.00    Types: Cigarettes  . Smokeless tobacco: Never Used  . Alcohol use No     Allergies   Ibuprofen; Clindamycin/lincomycin; Coconut flavor; Doxycycline hyclate; Flagyl [metronidazole]; Latex; Other; and Aspirin   Review of Systems Review of Systems 10 systems reviewed and all are negative for acute change except as noted in the HPI.  Physical Exam Updated Vital Signs BP 133/91 (BP Location: Left Arm)   Pulse 76   Temp 98.2 F (36.8 C) (Oral)   Resp 18   Ht 5\' 4"  (1.626 m)   Wt 104.3 kg   LMP 05/11/2015   SpO2 100%   BMI 39.48 kg/m   Physical Exam  Constitutional: She is oriented to person, place, and time. She appears well-developed and well-nourished. No distress.  HENT:  Head: Normocephalic and atraumatic.  Eyes: Conjunctivae are normal.  Cardiovascular: Normal rate.   Pulmonary/Chest: Effort normal.  Abdominal: She exhibits no distension.  Neurological: She is alert and oriented to person, place, and time.  Skin: Skin is warm and dry. No erythema.  1.5 cm cyst in between her eyebrow. No surrounding redness, no significant fluctuance, firm and mobile  Psychiatric: She has a normal mood and affect.  Nursing note and vitals reviewed.   ED Treatments / Results  DIAGNOSTIC STUDIES:  Oxygen Saturation is 99% on RA, normal by my interpretation.    COORDINATION OF CARE:  5:18 PM Discussed treatment plan with pt at bedside and pt agreed to plan.  Labs (all labs ordered are listed, but only abnormal results are displayed) Labs Reviewed - No data to display  EKG  EKG Interpretation None  Radiology No results found.  Procedures Procedures (including critical care time)  Medications Ordered in  ED Medications - No data to display   Initial Impression / Assessment and Plan / ED Course  I have reviewed the triage vital signs and the nursing notes.  Pertinent labs & imaging results that were available during my care of the patient were reviewed by me and considered in my medical decision making (see chart for details).  Clinical Course     Labs:  Imaging:  Consults:  Therapeutics:  Discharge Meds:   Assessment/Plan: Patient presents with a likely sebaceous cyst to her forehead. She has no signs of infectious etiology, this is been present for an extended period of time. I discussed appropriate removal, patient would like to follow-up with dermatology for further evaluation and management of this. Patient also having nausea, she reports this is chronic and unchanged from her baseline when taking her medications. She denies any fever or infectious etiology. Patient will given a refill of her antinausea medication. She is given strict return precautions, she verbalized understanding and agreement to today's plan had no further questions or concerns at time of discharge  Final Clinical Impressions(s) / ED Diagnoses   Final diagnoses:  Nausea  Epidermal cyst    New Prescriptions Discharge Medication List as of 04/15/2016  5:31 PM    I personally performed the services described in this documentation, which was scribed in my presence. The recorded information has been reviewed and is accurate.    Okey Regal, PA-C 04/15/16 1852    Davonna Belling, MD 04/16/16 [phone]

## 2016-04-15 NOTE — ED Notes (Signed)
Pt c/o abscess at center of forehead between eyebrows. Pt also states that she is out of nausea medication and would like a refill.

## 2016-04-15 NOTE — ED Triage Notes (Signed)
Pt states cyst to forehead for several years that she is due to have removed. 2 days ago she began experiencing pain and it feels as if it's draining.

## 2016-04-15 NOTE — Discharge Instructions (Signed)
Please read attached information. If you experience any new or worsening signs or symptoms please return to the emergency room for evaluation. Please follow-up with your primary care provider or specialist as discussed. Please use medication prescribed only as directed and discontinue taking if you have any concerning signs or symptoms.   °

## 2016-04-30 MED FILL — ACCU-CHEK SOFTCLIX LANCETS: 25 days supply | Qty: 100 | Fill #4

## 2016-04-30 MED FILL — ACCU-CHEK AVIVA PLUS TEST S: 25 days supply | Qty: 100 | Fill #2

## 2016-05-23 MED FILL — ACCU-CHEK AVIVA PLUS TEST S: 25 days supply | Qty: 100 | Fill #3

## 2016-05-23 MED FILL — ACCU-CHEK SOFTCLIX LANCETS: 25 days supply | Qty: 100 | Fill #5

## 2016-05-26 ENCOUNTER — Emergency Department (HOSPITAL_COMMUNITY)
Admission: EM | Admit: 2016-05-26 | Discharge: 2016-05-26 | Disposition: A | Discharge status: Home / Self Care | Payer: Medicaid Other | Attending: Emergency Medicine | Admitting: Emergency Medicine

## 2016-05-26 ENCOUNTER — Emergency Department (HOSPITAL_COMMUNITY): Payer: Medicaid Other

## 2016-05-26 ENCOUNTER — Encounter (HOSPITAL_COMMUNITY): Payer: Self-pay | Admitting: Nurse Practitioner

## 2016-05-26 DIAGNOSIS — Z91040 Latex allergy status: Secondary | ICD-10-CM | POA: Diagnosis not present

## 2016-05-26 DIAGNOSIS — J189 Pneumonia, unspecified organism: Secondary | ICD-10-CM

## 2016-05-26 DIAGNOSIS — Z79899 Other long term (current) drug therapy: Secondary | ICD-10-CM | POA: Diagnosis not present

## 2016-05-26 DIAGNOSIS — I10 Essential (primary) hypertension: Secondary | ICD-10-CM | POA: Insufficient documentation

## 2016-05-26 DIAGNOSIS — F17210 Nicotine dependence, cigarettes, uncomplicated: Secondary | ICD-10-CM | POA: Insufficient documentation

## 2016-05-26 DIAGNOSIS — J45909 Unspecified asthma, uncomplicated: Secondary | ICD-10-CM | POA: Insufficient documentation

## 2016-05-26 DIAGNOSIS — R05 Cough: Secondary | ICD-10-CM | POA: Diagnosis present

## 2016-05-26 MED ORDER — LIDOCAINE HCL (PF) 1 % IJ SOLN
INTRAMUSCULAR | Status: AC
Start: 2016-05-26 — End: 2016-05-26
  Administered 2016-05-26: 2.1 mL
  Filled 2016-05-26: qty 5

## 2016-05-26 MED ORDER — GUAIFENESIN-CODEINE 100-10 MG/5ML PO SOLN: 5.0000 mL | Freq: Four times a day (QID) | ORAL | 0 refills | Status: DC | PRN

## 2016-05-26 MED ORDER — CEFTRIAXONE SODIUM 250 MG IJ SOLR
250.0000 mg | Freq: Once | INTRAMUSCULAR | Status: AC
  Administered 2016-05-26: 250 mg via INTRAMUSCULAR
  Filled 2016-05-26: qty 250

## 2016-05-26 MED ORDER — AZITHROMYCIN 250 MG PO TABS: 250.0000 mg | ORAL_TABLET | Freq: Every day | ORAL | 0 refills | Status: DC

## 2016-05-26 NOTE — ED Triage Notes (Addendum)
Pt presents with c/o URI symptoms. Her symptoms began about 3 days ago. She c/o body aches, rhinorrhea, nasal and chest congestion, cough with yellow sputum. She denies fevers, sore throat. She has tried mucinex, tylenol sinus, hot tea, cough medicine at home with some relief.

## 2016-05-26 NOTE — ED Provider Notes (Signed)
[facility] DEPT Provider Note   CSN: [id number] Arrival date & time: 05/26/16  1644     History   Chief Complaint Chief Complaint  Patient presents with  . URI    HPI Lori Ford is a 41 y.o. female.  Patient with past medical history of HIV, and asthma presents to the emergency department with chief complaint of flulike symptoms. She states that she has had fever, chills, cough, sore throat, chest congestion for the past 3 days. She states that she became concerned today when she began to cough up thick yellow/brown sputum.   The history is provided by the patient. No language interpreter was used.    Past Medical History:  Diagnosis Date  . Allergic rhinitis   . Arthritis   . Asthma    as child  . Chronic back pain   . Cigarette nicotine dependence with withdrawal   . DVT (deep venous thrombosis) ([place])   . Gallstones    s/p cholecystectomy  . GERD (gastroesophageal reflux disease)   . HIV (human immunodeficiency virus infection) (Lori Ford)   . HTN (hypertension)   . Meningioma ([place])   . Sciatic pain   . Seizure disorder, grand mal (Lori Ford)    dx 2005  . Seizures ([place])    due to head trauma as adult    Patient Active Problem List   Diagnosis Date Noted  . 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 Chest pain 12/10/2014  . Chest wall pain 12/10/2014  . Left leg pain 12/10/2014  . Left knee pain 12/10/2014  . Nausea vomiting and diarrhea 12/10/2014  . Seizures ([place]) 12/10/2014  . Seizure ([place]) 12/10/2014  . Hyperkalemia 12/10/2014  . HIV disease (Lori Ford) 05/11/2014  . Meningioma (Lori Ford) 05/11/2014  . Major depression, recurrent, chronic (Lori Ford) 05/11/2014  . Morbid obesity (Lori Ford) 08/10/2013  . DVT, popliteal, acute, left 10/06/2012  . 64mm Pulmonary nodule, right upper lobe.  repeat CT chest May 2015. 10/06/2012  . Asthma 10/06/2012  . Hypertension 10/06/2012  . GERD (gastroesophageal reflux disease) 10/06/2012  . Cigarette nicotine dependence with withdrawal  10/06/2012  . Seizure disorder, grand mal (Lori Ford) 09/25/2012  . Brain tumor (Lori Ford) 09/25/2012    Past Surgical History:  Procedure Laterality Date  . BRAIN SURGERY     2013 to remove a meningioma  . CHOLECYSTECTOMY    . PERIPHERAL VASCULAR CATHETERIZATION N/A 10/31/2015   Procedure: IVC Filter Removal;  Surgeon: Adrian Prows, MD;  Location: [facility];  Service: Cardiovascular;  Laterality: N/A;  . PERIPHERAL VASCULAR CATHETERIZATION  10/31/2015   Procedure: IVC/SVC Venography;  Surgeon: Adrian Prows, MD;  Location: [facility];  Service: Cardiovascular;;    OB History    Gravida Para Term Preterm AB Living   3 1 1   2 1    SAB TAB Ectopic Multiple Live Births     2             Home Medications    Prior to Admission medications   Medication Sig Start Date End Date Taking? Authorizing Provider  albuterol (PROVENTIL HFA;VENTOLIN HFA) 108 (90 BASE) MCG/ACT inhaler Inhale 2 puffs into the lungs every 4 (four) hours as needed for wheezing or shortness of breath. 08/02/14   Britt Bottom, NP  albuterol (PROVENTIL) (2.5 MG/3ML) 0.083% nebulizer solution Take 2.5 mg by nebulization every 6 (six) hours as needed for wheezing or shortness of breath.    Historical Provider, MD  ALPRAZolam Duanne Moron) 1 MG tablet Take 2 tablets (2  mg total) by mouth 3 (three) times daily as needed for anxiety. 12/30/14   Nicole Pisciotta, PA-C  ALPRAZolam Duanne Moron) 1 MG tablet Take 1 tablet (1 mg total) by mouth 3 (three) times daily as needed for anxiety. 11/03/15   Elnora Morrison, MD  amphetamine-dextroamphetamine (ADDERALL XR) 30 MG 24 hr capsule Take 30 mg by mouth 2 (two) times daily.    Historical Provider, MD  beclomethasone (QVAR) 40 MCG/ACT inhaler Inhale 2 puffs into the lungs 2 (two) times daily as needed (for shortness of breath).     Historical Provider, MD  cyclobenzaprine (FLEXERIL) 10 MG tablet Take 1 tablet (10 mg total) by mouth 2 (two) times daily as needed for muscle spasms. 02/01/13   Larence Penning, MD  divalproex (DEPAKOTE ER) 500 MG 24 hr tablet Take 6 tablets (3,000 mg total) by mouth daily. 01/22/16   Pieter Partridge, DO  folic acid (FOLVITE) 1 MG tablet Take 1 mg by mouth daily. Reported on 09/06/2015    Historical Provider, MD  gabapentin (NEURONTIN) 400 MG capsule Take 800 mg by mouth 3 (three) times daily.     Historical Provider, MD  glucose blood test strip Use as instructed 12/10/15   Lanae Crumbly, PA-C  hydrochlorothiazide (HYDRODIURIL) 25 MG tablet Take 1 tablet (25 mg total) by mouth daily. 07/09/13   Harvie Heck, PA-C  HYDROcodone-acetaminophen (HYCET) 7.5-325 mg/15 ml solution Take 10 mLs by mouth 4 (four) times daily as needed for moderate pain. 02/09/16   Konrad Felix, PA  HYDROcodone-acetaminophen (NORCO) 10-325 MG tablet Take 1 tablet by mouth every 6 (six) hours as needed. 12/03/15   Konrad Felix, PA  insulin glargine (LANTUS) 100 UNIT/ML injection Inject 10 Units into the skin daily.    Historical Provider, MD  levETIRAcetam (KEPPRA) 500 MG tablet Take 2 tablets (1,000 mg total) by mouth 2 (two) times daily. 12/30/14   Nicole Pisciotta, PA-C  loratadine (CLARITIN) 10 MG tablet Take 10 mg by mouth daily.  03/03/14   Historical Provider, MD  Mefenamic Acid 250 MG CAPS Take 2 capsules (500 mg total) by mouth 3 (three) times daily. X 4 days 08/30/14   Mercedes Street, PA-C  meloxicam (MOBIC) 15 MG tablet Take 1 tablet (15 mg total) by mouth daily. 12/07/14   Tiffany Carlota Raspberry, PA-C  metroNIDAZOLE (METROGEL VAGINAL) 0.75 % vaginal gel Place 1 Applicatorful vaginally 2 (two) times daily. X 5 days 08/30/14   Mercedes Street, PA-C  omeprazole (PRILOSEC) 20 MG capsule Take 1 capsule (20 mg total) by mouth 2 (two) times daily. 01/22/16   Konrad Felix, PA  ondansetron (ZOFRAN ODT) 4 MG disintegrating tablet Take 1 tablet (4 mg total) by mouth every 8 (eight) hours as needed for nausea or vomiting. 03/30/15   Nona Dell, PA-C  oxyCODONE-acetaminophen (PERCOCET) 10-325  MG per tablet Take 1 tablet by mouth every 4 (four) hours as needed for pain.    Historical Provider, MD  oxyCODONE-acetaminophen (PERCOCET) 10-325 MG tablet Take 1 tablet by mouth every 4 (four) hours as needed for pain. 10/31/15   Adrian Prows, MD  oxymetazoline (AFRIN NASAL SPRAY) 0.05 % nasal spray Place 1 spray into both nostrils 2 (two) times daily. 03/30/15   Nona Dell, PA-C  PARoxetine (PAXIL) 40 MG tablet Take 1 tablet (40 mg total) by mouth daily. 07/09/13   Harvie Heck, PA-C  promethazine (PHENERGAN) 6.25 MG/5ML syrup Take 10 mLs (12.5 mg total) by mouth every 8 (eight) hours as needed for  nausea or vomiting. 04/15/16   Okey Regal, PA-C  propranolol (INDERAL) 10 MG tablet Take 10 mg by mouth 2 (two) times daily.  01/28/14   Historical Provider, MD  sitaGLIPtin (JANUVIA) 50 MG tablet Take 50 mg by mouth daily.    Historical Provider, MD  sodium chloride (OCEAN) 0.65 % nasal spray Place 1 spray into the nose 2 (two) times daily as needed for congestion.     Historical Provider, MD  triamcinolone ointment (KENALOG) 0.5 % Apply 1 application topically 2 (two) times daily.    Historical Provider, MD  ZOLMitriptan (ZOMIG) 2.5 MG tablet Take 1 tablet by mouth 4 (four) times daily. 03/25/15   Historical Provider, MD    Family History Family History  Problem Relation Age of Onset  . Other Mother     varicose vein  . Asthma Mother   . High blood pressure Mother   . Cancer Father   . Cancer    . Diabetes    . High blood pressure    . Asthma    . Thyroid disease    . Deep vein thrombosis Neg Hx   . Pulmonary embolism Neg Hx     Social History Social History  Substance Use Topics  . Smoking status: Current Every Day Smoker    Packs/day: 0.25    Years: 18.00    Types: Cigarettes  . Smokeless tobacco: Never Used  . Alcohol use No     Allergies   Ibuprofen; Clindamycin/lincomycin; Coconut flavor; Doxycycline hyclate; Flagyl [metronidazole]; Latex; Other; and  Aspirin   Review of Systems Review of Systems  All other systems reviewed and are negative.    Physical Exam Updated Vital Signs BP 124/81   Pulse 93   Temp 98.8 F (37.1 C) (Oral)   Resp 17   SpO2 100%   Physical Exam Nursing note and vitals reviewed.  Constitutional: Appears well-developed and well-nourished. No distress.  HENT: Oropharynx is mildly erythematous, no tonsillar exudates or abscess, airway intact. Eyes: Conjunctivae are normal. Pupils are equal, round, and reactive to light.  Neck: Normal range of motion and full passive range of motion without pain.  Cardiovascular: Normal rate and rhythm.   Pulmonary/Chest: Effort normal and breath sounds normal. No stridor. No wheezes, rhonchi, or rales. Abdominal: Soft. There is no focal abdominal tenderness.  Musculoskeletal: Normal range of motion. Moves all extremities. Neurological: Pt is alert and oriented to person, place, and time. Sensation and strength grossly intact throughout. Skin: Skin is warm and dry. No rash noted. Pt is not diaphoretic.  Psychiatric: Normal mood and affect.    ED Treatments / Results  Labs (all labs ordered are listed, but only abnormal results are displayed) Labs Reviewed - No data to display  EKG  EKG Interpretation None       Radiology Dg Chest 2 View  Result Date: 05/26/2016 CLINICAL DATA:  Upper respiratory symptoms x3 days EXAM: CHEST  2 VIEW COMPARISON:  03/30/2015 FINDINGS: The heart size and mediastinal contours are within normal limits. Faint bibasilar airspace opacities may reflect subtle pneumonia. The visualized skeletal structures are unremarkable. No IMPRESSION: Faint bibasilar airspace opacities relative to prior exam may reflect small foci of bibasilar pneumonia. Otherwise negative exam. Electronically Signed   By: Ashley Royalty M.D.   On: 05/26/2016 18:56    Procedures Procedures (including critical care time)  Medications Ordered in ED Medications - No data  to display   Initial Impression / Assessment and Plan / ED Course  I have reviewed the triage vital signs and the nursing notes.  Pertinent labs & imaging results that were available during my care of the patient were reviewed by me and considered in my medical decision making (see chart for details).     Patient with productive cough. Her vital signs are stable. He did have some flulike symptoms, but is outside of the treatment window for this. Chest x-ray shows faint bibasilar airspace opacities. I will treat her with a shot of Rocephin and discharged with azithromycin. Patient states that her last CD4 count was high, and that her viral load was low. She has been compliant with her antivirals. She is followed closely by Dr. Drucilla Schmidt of infectious disease.  VSS.  DC to home.  Return precautions given.  Final Clinical Impressions(s) / ED Diagnoses   Final diagnoses:  Community acquired pneumonia, unspecified laterality    New Prescriptions New Prescriptions   AZITHROMYCIN (ZITHROMAX) 250 MG TABLET    Take 1 tablet (250 mg total) by mouth daily. Take first 2 tablets together, then 1 every day until finished.     Montine Circle, PA-C 05/26/16 2002    Milton Ferguson, MD 05/26/16 2045

## 2016-05-30 NOTE — Telephone Encounter (Signed)
Shouldn't she be getting this from their PCP?

## 2016-05-30 NOTE — Telephone Encounter (Signed)
Please advise 

## 2016-06-16 ENCOUNTER — Encounter (HOSPITAL_COMMUNITY): Payer: Self-pay

## 2016-06-16 ENCOUNTER — Emergency Department (HOSPITAL_COMMUNITY): Payer: Medicaid Other

## 2016-06-16 ENCOUNTER — Emergency Department (HOSPITAL_COMMUNITY)
Admission: EM | Admit: 2016-06-16 | Discharge: 2016-06-16 | Disposition: A | Discharge status: Home / Self Care | Payer: Medicaid Other | Attending: Emergency Medicine | Admitting: Emergency Medicine

## 2016-06-16 DIAGNOSIS — I10 Essential (primary) hypertension: Secondary | ICD-10-CM | POA: Insufficient documentation

## 2016-06-16 DIAGNOSIS — R109 Unspecified abdominal pain: Secondary | ICD-10-CM

## 2016-06-16 DIAGNOSIS — Z794 Long term (current) use of insulin: Secondary | ICD-10-CM | POA: Insufficient documentation

## 2016-06-16 DIAGNOSIS — J45909 Unspecified asthma, uncomplicated: Secondary | ICD-10-CM | POA: Insufficient documentation

## 2016-06-16 DIAGNOSIS — F17210 Nicotine dependence, cigarettes, uncomplicated: Secondary | ICD-10-CM | POA: Diagnosis not present

## 2016-06-16 DIAGNOSIS — R1032 Left lower quadrant pain: Secondary | ICD-10-CM | POA: Diagnosis not present

## 2016-06-16 DIAGNOSIS — Z91040 Latex allergy status: Secondary | ICD-10-CM | POA: Insufficient documentation

## 2016-06-16 DIAGNOSIS — R1031 Right lower quadrant pain: Secondary | ICD-10-CM | POA: Diagnosis not present

## 2016-06-16 LAB — URINALYSIS, ROUTINE W REFLEX MICROSCOPIC
BILIRUBIN URINE: NEGATIVE
Bacteria, UA: NONE SEEN
GLUCOSE, UA: NEGATIVE mg/dL
KETONES UR: NEGATIVE mg/dL
LEUKOCYTES UA: NEGATIVE
Nitrite: NEGATIVE
PH: 7 (ref 5.0–8.0)
Protein, ur: NEGATIVE mg/dL
Specific Gravity, Urine: 1.018 (ref 1.005–1.030)
WBC, UA: NONE SEEN WBC/hpf (ref 0–5)

## 2016-06-16 LAB — COMPREHENSIVE METABOLIC PANEL
ALBUMIN: 4.2 g/dL (ref 3.5–5.0)
ALT: 17 U/L (ref 14–54)
ANION GAP: 9 (ref 5–15)
AST: 22 U/L (ref 15–41)
Alkaline Phosphatase: 62 U/L (ref 38–126)
BILIRUBIN TOTAL: 0.7 mg/dL (ref 0.3–1.2)
BUN: 7 mg/dL (ref 6–20)
CHLORIDE: 103 mmol/L (ref 101–111)
CO2: 24 mmol/L (ref 22–32)
Calcium: 9.6 mg/dL (ref 8.9–10.3)
Creatinine, Ser: 0.95 mg/dL (ref 0.44–1.00)
GFR calc Af Amer: >60 mL/min (ref >60)
GFR calc non Af Amer: >60 mL/min (ref >60)
GLUCOSE: 88 mg/dL (ref 65–99)
POTASSIUM: 4.2 mmol/L (ref 3.5–5.1)
SODIUM: 136 mmol/L (ref 135–145)
TOTAL PROTEIN: 8 g/dL (ref 6.5–8.1)

## 2016-06-16 LAB — CBC WITH DIFFERENTIAL/PLATELET
BASOS ABS: 0 10*3/uL (ref 0.0–0.1)
Basophils Relative: 1 %
EOS ABS: 0 10*3/uL (ref 0.0–0.7)
EOS PCT: 1 %
HCT: 42.4 % (ref 36.0–46.0)
HEMOGLOBIN: 14.3 g/dL (ref 12.0–15.0)
LYMPHS PCT: 46 %
Lymphs Abs: 2.4 10*3/uL (ref 0.7–4.0)
MCH: 30.4 pg (ref 26.0–34.0)
MCHC: 33.7 g/dL (ref 30.0–36.0)
MCV: 90.2 fL (ref 78.0–100.0)
Monocytes Absolute: 0.4 10*3/uL (ref 0.1–1.0)
Monocytes Relative: 8 %
Neutro Abs: 2.3 10*3/uL (ref 1.7–7.7)
Neutrophils Relative %: 44 %
PLATELETS: 235 10*3/uL (ref 150–400)
RBC: 4.7 MIL/uL (ref 3.87–5.11)
RDW: 14 % (ref 11.5–15.5)
WBC: 5.1 10*3/uL (ref 4.0–10.5)

## 2016-06-16 LAB — LIPASE, BLOOD: Lipase: 15 U/L (ref 11–51)

## 2016-06-16 LAB — I-STAT BETA HCG BLOOD, ED (MC, WL, AP ONLY): I-stat hCG, quantitative: <5 m[IU]/mL (ref <5)

## 2016-06-16 MED ORDER — ONDANSETRON 4 MG PO TBDP
4.0000 mg | ORAL_TABLET | Freq: Once | ORAL | Status: AC
Start: 2016-06-16 — End: 2016-06-16
  Administered 2016-06-16: 4 mg via ORAL
  Filled 2016-06-16: qty 1

## 2016-06-16 MED ORDER — SODIUM CHLORIDE 0.9 % IV BOLUS (SEPSIS): 1000.0000 mL | Freq: Once | INTRAVENOUS | Status: DC

## 2016-06-16 MED ORDER — PROMETHAZINE HCL 6.25 MG/5ML PO SYRP: 12.5000 mg | ORAL_SOLUTION | Freq: Three times a day (TID) | ORAL | 2 refills | Status: DC | PRN

## 2016-06-16 MED ORDER — SODIUM CHLORIDE 0.9 % IV SOLN: INTRAVENOUS | Status: DC

## 2016-06-16 MED ORDER — ONDANSETRON HCL 4 MG/2ML IJ SOLN
4.0000 mg | Freq: Once | INTRAMUSCULAR | Status: DC
  Filled 2016-06-16: qty 2

## 2016-06-16 MED ORDER — HYDROMORPHONE HCL 2 MG/ML IJ SOLN
1.0000 mg | Freq: Once | INTRAMUSCULAR | Status: AC
  Administered 2016-06-16: 1 mg via INTRAMUSCULAR

## 2016-06-16 MED ORDER — HYDROMORPHONE HCL 2 MG/ML IJ SOLN
0.5000 mg | INTRAMUSCULAR | Status: DC | PRN
Start: 2016-06-16 — End: 2016-06-16
  Filled 2016-06-16: qty 1

## 2016-06-16 NOTE — ED Notes (Signed)
Patient denies having bowel movement for over 1 week.  States that she did have a small, hard stool this morning.  Nausea/vomiting x 1 week.

## 2016-06-16 NOTE — ED Notes (Signed)
IV team unsuccessful at IV attempt.  Changing meds to ODT and IM.  CT changed to be without contrast.

## 2016-06-16 NOTE — Discharge Instructions (Signed)
Follow-up with your primary care doctor, take the medications as needed.

## 2016-06-16 NOTE — ED Notes (Signed)
Pt in CT.

## 2016-06-16 NOTE — ED Notes (Signed)
IV team at bedside 

## 2016-06-16 NOTE — ED Notes (Signed)
Gabe, RN unable to get IV access. Consulting with IV team.

## 2016-06-16 NOTE — ED Notes (Signed)
Departure condition placed for incorrect individual.  Unable to delete.

## 2016-06-16 NOTE — ED Provider Notes (Signed)
[facility] DEPT Provider Note   CSN: [id number] Arrival date & time: 06/16/16  0915     History   Chief Complaint Chief Complaint  Patient presents with  . Constipation    HPI Lori Ford is a 41 y.o. female.  HPI Pt is having nausea, vomiting and abdominal pain.  The symptoms started a week ago,   The pain is all over her abdomen.  No fevers.   Whenever she tries to eat or drink she vomits.  Pt thought she might be constipated so she took laxatives.  She has tried metamucil and multiple over the counter medications without relief.  NO dysuria.  NO vag discharge.  No abd surgery. Past Medical History:  Diagnosis Date  . Allergic rhinitis   . Arthritis   . Asthma    as child  . Chronic back pain   . Cigarette nicotine dependence with withdrawal   . DVT (deep venous thrombosis) ([place])   . Gallstones    s/p cholecystectomy  . GERD (gastroesophageal reflux disease)   . HIV (human immunodeficiency virus infection) ([place])   . HTN (hypertension)   . Meningioma ([place])   . Sciatic pain   . Seizure disorder, grand mal ([place])    dx 2005  . Seizures ([place])    due to head trauma as adult    Patient Active Problem List   Diagnosis Date Noted  . 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 Chest pain 12/10/2014  . Chest wall pain 12/10/2014  . Left leg pain 12/10/2014  . Left knee pain 12/10/2014  . Nausea vomiting and diarrhea 12/10/2014  . Seizures ([place]) 12/10/2014  . Seizure ([place]) 12/10/2014  . Hyperkalemia 12/10/2014  . HIV disease ([place]) 05/11/2014  . Meningioma ([place]) 05/11/2014  . Major depression, recurrent, chronic ([place]) 05/11/2014  . Morbid obesity ([place]) 08/10/2013  . DVT, popliteal, acute, left 10/06/2012  . 76mm Pulmonary nodule, right upper lobe.  repeat CT chest May 2015. 10/06/2012  . Asthma 10/06/2012  . Hypertension 10/06/2012  . GERD (gastroesophageal reflux disease) 10/06/2012  . Cigarette nicotine dependence with withdrawal 10/06/2012  . Seizure disorder,  grand mal ([place]) 09/25/2012  . Brain tumor ([place]) 09/25/2012    Past Surgical History:  Procedure Laterality Date  . BRAIN SURGERY     2013 to remove a meningioma  . CHOLECYSTECTOMY    . PERIPHERAL VASCULAR CATHETERIZATION N/A 10/31/2015   Procedure: IVC Filter Removal;  Surgeon: Adrian Prows, MD;  Location: [facility];  Service: Cardiovascular;  Laterality: N/A;  . PERIPHERAL VASCULAR CATHETERIZATION  10/31/2015   Procedure: IVC/SVC Venography;  Surgeon: Adrian Prows, MD;  Location: [facility];  Service: Cardiovascular;;    OB History    Gravida Para Term Preterm AB Living   3 1 1   2 1    SAB TAB Ectopic Multiple Live Births     2             Home Medications    Prior to Admission medications   Medication Sig Start Date End Date Taking? Authorizing Provider  albuterol (PROVENTIL HFA;VENTOLIN HFA) 108 (90 BASE) MCG/ACT inhaler Inhale 2 puffs into the lungs every 4 (four) hours as needed for wheezing or shortness of breath. 08/02/14   Britt Bottom, NP  albuterol (PROVENTIL) (2.5 MG/3ML) 0.083% nebulizer solution Take 2.5 mg by nebulization every 6 (six) hours as needed for wheezing or shortness of breath.    Historical Provider, MD  ALPRAZolam Duanne Moron) 1 MG tablet Take  2 tablets (2 mg total) by mouth 3 (three) times daily as needed for anxiety. 12/30/14   Nicole Pisciotta, PA-C  ALPRAZolam Duanne Moron) 1 MG tablet Take 1 tablet (1 mg total) by mouth 3 (three) times daily as needed for anxiety. 11/03/15   Elnora Morrison, MD  amphetamine-dextroamphetamine (ADDERALL XR) 30 MG 24 hr capsule Take 30 mg by mouth 2 (two) times daily.    Historical Provider, MD  azithromycin (ZITHROMAX) 250 MG tablet Take 1 tablet (250 mg total) by mouth daily. Take first 2 tablets together, then 1 every day until finished. 05/26/16   Montine Circle, PA-C  beclomethasone (QVAR) 40 MCG/ACT inhaler Inhale 2 puffs into the lungs 2 (two) times daily as needed (for shortness of breath).     Historical Provider,  MD  cyclobenzaprine (FLEXERIL) 10 MG tablet Take 1 tablet (10 mg total) by mouth 2 (two) times daily as needed for muscle spasms. 02/01/13   Larence Penning, MD  divalproex (DEPAKOTE ER) 500 MG 24 hr tablet Take 6 tablets (3,000 mg total) by mouth daily. 01/22/16   Pieter Partridge, DO  folic acid (FOLVITE) 1 MG tablet Take 1 mg by mouth daily. Reported on 09/06/2015    Historical Provider, MD  gabapentin (NEURONTIN) 400 MG capsule Take 800 mg by mouth 3 (three) times daily.     Historical Provider, MD  glucose blood test strip Use as instructed 12/10/15   Lanae Crumbly, PA-C  guaiFENesin-codeine 100-10 MG/5ML syrup Take 5 mLs by mouth every 6 (six) hours as needed for cough. 05/26/16   Montine Circle, PA-C  hydrochlorothiazide (HYDRODIURIL) 25 MG tablet Take 1 tablet (25 mg total) by mouth daily. 07/09/13   Harvie Heck, PA-C  HYDROcodone-acetaminophen (HYCET) 7.5-325 mg/15 ml solution Take 10 mLs by mouth 4 (four) times daily as needed for moderate pain. 02/09/16   Konrad Felix, PA  HYDROcodone-acetaminophen (NORCO) 10-325 MG tablet Take 1 tablet by mouth every 6 (six) hours as needed. 12/03/15   Konrad Felix, PA  insulin glargine (LANTUS) 100 UNIT/ML injection Inject 10 Units into the skin daily.    Historical Provider, MD  levETIRAcetam (KEPPRA) 500 MG tablet Take 2 tablets (1,000 mg total) by mouth 2 (two) times daily. 12/30/14   Nicole Pisciotta, PA-C  loratadine (CLARITIN) 10 MG tablet Take 10 mg by mouth daily.  03/03/14   Historical Provider, MD  Mefenamic Acid 250 MG CAPS Take 2 capsules (500 mg total) by mouth 3 (three) times daily. X 4 days 08/30/14   Mercedes Street, PA-C  meloxicam (MOBIC) 15 MG tablet Take 1 tablet (15 mg total) by mouth daily. 12/07/14   Tiffany Carlota Raspberry, PA-C  metroNIDAZOLE (METROGEL VAGINAL) 0.75 % vaginal gel Place 1 Applicatorful vaginally 2 (two) times daily. X 5 days 08/30/14   Mercedes Street, PA-C  omeprazole (PRILOSEC) 20 MG capsule Take 1 capsule (20 mg total) by mouth  2 (two) times daily. 01/22/16   Konrad Felix, PA  ondansetron (ZOFRAN ODT) 4 MG disintegrating tablet Take 1 tablet (4 mg total) by mouth every 8 (eight) hours as needed for nausea or vomiting. 03/30/15   Nona Dell, PA-C  oxyCODONE-acetaminophen (PERCOCET) 10-325 MG per tablet Take 1 tablet by mouth every 4 (four) hours as needed for pain.    Historical Provider, MD  oxyCODONE-acetaminophen (PERCOCET) 10-325 MG tablet Take 1 tablet by mouth every 4 (four) hours as needed for pain. 10/31/15   Adrian Prows, MD  oxymetazoline (AFRIN NASAL SPRAY) 0.05 % nasal spray  Place 1 spray into both nostrils 2 (two) times daily. 03/30/15   Nona Dell, PA-C  PARoxetine (PAXIL) 40 MG tablet Take 1 tablet (40 mg total) by mouth daily. 07/09/13   Harvie Heck, PA-C  promethazine (PHENERGAN) 6.25 MG/5ML syrup Take 10 mLs (12.5 mg total) by mouth every 8 (eight) hours as needed for nausea or vomiting. 06/16/16   Dorie Rank, MD  propranolol (INDERAL) 10 MG tablet Take 10 mg by mouth 2 (two) times daily.  01/28/14   Historical Provider, MD  sitaGLIPtin (JANUVIA) 50 MG tablet Take 50 mg by mouth daily.    Historical Provider, MD  sodium chloride (OCEAN) 0.65 % nasal spray Place 1 spray into the nose 2 (two) times daily as needed for congestion.     Historical Provider, MD  triamcinolone ointment (KENALOG) 0.5 % Apply 1 application topically 2 (two) times daily.    Historical Provider, MD  ZOLMitriptan (ZOMIG) 2.5 MG tablet Take 1 tablet by mouth 4 (four) times daily. 03/25/15   Historical Provider, MD    Family History Family History  Problem Relation Age of Onset  . Other Mother     varicose vein  . Asthma Mother   . High blood pressure Mother   . Cancer Father   . Cancer    . Diabetes    . High blood pressure    . Asthma    . Thyroid disease    . Deep vein thrombosis Neg Hx   . Pulmonary embolism Neg Hx     Social History Social History  Substance Use Topics  . Smoking status:  Current Every Day Smoker    Packs/day: 0.25    Years: 18.00    Types: Cigarettes  . Smokeless tobacco: Never Used  . Alcohol use No     Allergies   Ibuprofen; Clindamycin/lincomycin; Coconut flavor; Doxycycline hyclate; Flagyl [metronidazole]; Latex; Other; and Aspirin   Review of Systems Review of Systems  All other systems reviewed and are negative.    Physical Exam Updated Vital Signs BP (!) 138/122   Pulse 83   Temp 99.7 F (37.6 C) (Oral)   Resp 20   Ht 5\' 5"  (1.651 m)   Wt 107 kg   SpO2 98%   BMI 39.27 kg/m   Physical Exam  Constitutional: She appears well-developed and well-nourished. No distress.  HENT:  Head: Normocephalic and atraumatic.  Right Ear: External ear normal.  Left Ear: External ear normal.  Eyes: Conjunctivae are normal. Right eye exhibits no discharge. Left eye exhibits no discharge. No scleral icterus.  Neck: Neck supple. No tracheal deviation present.  Cardiovascular: Normal rate, regular rhythm and intact distal pulses.   Pulmonary/Chest: Effort normal and breath sounds normal. No stridor. No respiratory distress. She has no wheezes. She has no rales.  Abdominal: Soft. Bowel sounds are normal. She exhibits no distension. There is generalized tenderness and tenderness in the right lower quadrant, suprapubic area and left lower quadrant. There is no rebound and no guarding.  Tenderness greatest in the lower abdomen  Musculoskeletal: She exhibits no edema or tenderness.  Neurological: She is alert. She has normal strength. No cranial nerve deficit (no facial droop, extraocular movements intact, no slurred speech) or sensory deficit. She exhibits normal muscle tone. She displays no seizure activity. Coordination normal.  Skin: Skin is warm and dry. No rash noted.  Psychiatric: She has a normal mood and affect.  Nursing note and vitals reviewed.    ED Treatments / Results  Labs (all labs ordered are listed, but only abnormal results are  displayed) Labs Reviewed  URINALYSIS, ROUTINE W REFLEX MICROSCOPIC - Abnormal; Notable for the following:       Result Value   Hgb urine dipstick MODERATE (*)    Squamous Epithelial / LPF 0-5 (*)    All other components within normal limits  COMPREHENSIVE METABOLIC PANEL  LIPASE, BLOOD  CBC WITH DIFFERENTIAL/PLATELET  RPR  HIV ANTIBODY (ROUTINE TESTING)  I-STAT BETA HCG BLOOD, ED ([facility], [facility], [facility] ONLY)  GC/CHLAMYDIA PROBE AMP ([place]) NOT AT [facility]    Radiology Ct Abdomen Pelvis Wo Contrast  Result Date: 06/16/2016 CLINICAL DATA:  Vomiting, constipation, and abdominal pain for 1 week. EXAM: CT ABDOMEN AND PELVIS WITHOUT CONTRAST TECHNIQUE: Multidetector CT imaging of the abdomen and pelvis was performed following the standard protocol without IV contrast. COMPARISON:  August 02, 2013 FINDINGS: Lower chest: No acute abnormality. Hepatobiliary: No hepatic injury or perihepatic hematoma. Gallbladder is unremarkable Pancreas: Unremarkable. No pancreatic ductal dilatation or surrounding inflammatory changes. Spleen: Normal in size without focal abnormality. Adrenals/Urinary Tract: Adrenal glands are unremarkable. Kidneys are normal, without renal calculi, focal lesion, or hydronephrosis. Bladder is unremarkable. Stomach/Bowel: The stomach and small bowel is normal. The colon and appendix are normal. Vascular/Lymphatic: Mild atherosclerosis in the abdominal aorta with no adenopathy. Reproductive: Apparent uterine fibroids, incompletely evaluated without contrast. No adnexal masses identified. Other: No free air. There is an umbilical hernia. Low-attenuation in the hernia sac appears to represent fluid, not seen previously. No other free fluid identified. Musculoskeletal: No acute or significant osseous findings. IMPRESSION: 1. There is a small periumbilical hernia. There is a small amount of fluid in the hernia sac which is nonspecific but new since April 2015. No other free fluid in the abdomen. 2. Fibroid  uterus, incompletely evaluated. The left ovary is more prominent in size than the right but no suspicious adnexal masses are seen. Pelvic ultrasound could better evaluate the pelvis and ovaries if warranted. 3. Mild atherosclerotic changes. 4. No other acute abnormalities. Electronically Signed   By: Dorise Bullion III M.D   On: 06/16/2016 15:59    Procedures Procedures (including critical care time)  Medications Ordered in ED Medications  HYDROmorphone (DILAUDID) injection 1 mg (1 mg Intramuscular Given 06/16/16 1519)  ondansetron (ZOFRAN-ODT) disintegrating tablet 4 mg (4 mg Oral Given 06/16/16 1519)     Initial Impression / Assessment and Plan / ED Course  I have reviewed the triage vital signs and the nursing notes.  Pertinent labs & imaging results that were available during my care of the patient were reviewed by me and considered in my medical decision making (see chart for details).  Clinical Course as of Jun 16 1645  Sun Jun 16, 2016  1519 Unable to get an IV.  Will change to non con ct scan.  Im meds  [JK]    Clinical Course User Index [JK] Dorie Rank, MD    Patient presented to the emergency room with complaints of nausea vomiting and abdominal pain. She thought her symptoms were related to constipation. CT scan was performed to evaluate for diverticulitis colitis. With some difficulty getting an IV and therefore noncontrast CT scan was performed. Fortunately no acute findings were noted on the CT scan. She does not have appendicitis colitis diverticulitis and there is no constipation. Patient is feeling better now. Her symptoms have resolved.  I planned on doing a pelvic exam hiowever, the patient states she needs to leave and follow  up with her primary doctor. Overall have low suspicion the symptoms are related to a uterine, ovarian pelvic etiology  Final Clinical Impressions(s) / ED Diagnoses   Final diagnoses:  Abdominal pain, unspecified abdominal location    New  Prescriptions Current Discharge Medication List       Dorie Rank, MD 06/16/16 [phone]

## 2016-06-16 NOTE — ED Notes (Signed)
IV team called and said they are on the way.

## 2016-06-16 NOTE — ED Triage Notes (Signed)
Patient complains of 1 week of constipation. Has taken several otc meds for same with minimal relief. Reports abdominal cramping now with nausea, NAD

## 2016-06-17 ENCOUNTER — Encounter (HOSPITAL_COMMUNITY): Payer: Self-pay | Admitting: Emergency Medicine

## 2016-06-17 ENCOUNTER — Ambulatory Visit (HOSPITAL_COMMUNITY)
Admission: EM | Admit: 2016-06-17 | Discharge: 2016-06-17 | Disposition: A | Discharge status: Home / Self Care | Payer: Medicaid Other | Attending: Family Medicine | Admitting: Family Medicine

## 2016-06-17 DIAGNOSIS — Z760 Encounter for issue of repeat prescription: Secondary | ICD-10-CM | POA: Diagnosis not present

## 2016-06-17 LAB — RPR: RPR: NONREACTIVE

## 2016-06-17 MED ORDER — GLUCOSE BLOOD VI STRP: ORAL_STRIP | 2 refills | Status: DC

## 2016-06-17 MED ORDER — SITAGLIPTIN PHOSPHATE 50 MG PO TABS: 50.0000 mg | ORAL_TABLET | Freq: Every day | ORAL | 0 refills | Status: DC

## 2016-06-17 NOTE — ED Provider Notes (Signed)
[place]    CSN: [id number] Arrival date & time: 06/17/16  1222     History   Chief Complaint Chief Complaint  Patient presents with  . Medication Refill    HPI Lori Ford is a 41 y.o. female.   Out of januvia and test strips.  Patient missed last appt with pcp due to a death in the family-out of town.  Has an appt 4/15 with pcp      Past Medical History:  Diagnosis Date  . Allergic rhinitis   . Arthritis   . Asthma    as child  . Chronic back pain   . Cigarette nicotine dependence with withdrawal   . DVT (deep venous thrombosis) ([place])   . Gallstones    s/p cholecystectomy  . GERD (gastroesophageal reflux disease)   . HIV (human immunodeficiency virus infection) ([place])   . HTN (hypertension)   . Meningioma ([place])   . Sciatic pain   . Seizure disorder, grand mal ([place])    dx 2005  . Seizures ([place])    due to head trauma as adult    Patient Active Problem List   Diagnosis Date Noted  . 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 Chest pain 12/10/2014  . Chest wall pain 12/10/2014  . Left leg pain 12/10/2014  . Left knee pain 12/10/2014  . Nausea vomiting and diarrhea 12/10/2014  . Seizures ([place]) 12/10/2014  . Seizure ([place]) 12/10/2014  . Hyperkalemia 12/10/2014  . HIV disease ([place]) 05/11/2014  . Meningioma ([place]) 05/11/2014  . Major depression, recurrent, chronic ([place]) 05/11/2014  . Morbid obesity ([place]) 08/10/2013  . DVT, popliteal, acute, left 10/06/2012  . 25mm Pulmonary nodule, right upper lobe.  repeat CT chest May 2015. 10/06/2012  . Asthma 10/06/2012  . Hypertension 10/06/2012  . GERD (gastroesophageal reflux disease) 10/06/2012  . Cigarette nicotine dependence with withdrawal 10/06/2012  . Seizure disorder, grand mal ([place]) 09/25/2012  . Brain tumor ([place]) 09/25/2012    Past Surgical History:  Procedure Laterality Date  . BRAIN SURGERY     2013 to remove a meningioma  . CHOLECYSTECTOMY    . PERIPHERAL VASCULAR CATHETERIZATION N/A  10/31/2015   Procedure: IVC Filter Removal;  Surgeon: Adrian Prows, MD;  Location: [facility];  Service: Cardiovascular;  Laterality: N/A;  . PERIPHERAL VASCULAR CATHETERIZATION  10/31/2015   Procedure: IVC/SVC Venography;  Surgeon: Adrian Prows, MD;  Location: [facility];  Service: Cardiovascular;;    OB History    Gravida Para Term Preterm AB Living   3 1 1   2 1    SAB TAB Ectopic Multiple Live Births     2             Home Medications    Prior to Admission medications   Medication Sig Start Date End Date Taking? Authorizing Provider  albuterol (PROVENTIL HFA;VENTOLIN HFA) 108 (90 BASE) MCG/ACT inhaler Inhale 2 puffs into the lungs every 4 (four) hours as needed for wheezing or shortness of breath. 08/02/14   Britt Bottom, NP  albuterol (PROVENTIL) (2.5 MG/3ML) 0.083% nebulizer solution Take 2.5 mg by nebulization every 6 (six) hours as needed for wheezing or shortness of breath.    Historical Provider, MD  ALPRAZolam Duanne Moron) 1 MG tablet Take 2 tablets (2 mg total) by mouth 3 (three) times daily as needed for anxiety. 12/30/14   Nicole Pisciotta, PA-C  ALPRAZolam Duanne Moron) 1 MG tablet Take 1 tablet (1 mg total) by mouth 3 (three) times  daily as needed for anxiety. 11/03/15   Elnora Morrison, MD  amphetamine-dextroamphetamine (ADDERALL XR) 30 MG 24 hr capsule Take 30 mg by mouth 2 (two) times daily.    Historical Provider, MD  beclomethasone (QVAR) 40 MCG/ACT inhaler Inhale 2 puffs into the lungs 2 (two) times daily as needed (for shortness of breath).     Historical Provider, MD  cyclobenzaprine (FLEXERIL) 10 MG tablet Take 1 tablet (10 mg total) by mouth 2 (two) times daily as needed for muscle spasms. 02/01/13   Larence Penning, MD  divalproex (DEPAKOTE ER) 500 MG 24 hr tablet Take 6 tablets (3,000 mg total) by mouth daily. 01/22/16   Pieter Partridge, DO  folic acid (FOLVITE) 1 MG tablet Take 1 mg by mouth daily. Reported on 09/06/2015    Historical Provider, MD  gabapentin (NEURONTIN)  400 MG capsule Take 800 mg by mouth 3 (three) times daily.     Historical Provider, MD  glucose blood test strip Use as instructed 06/17/16   Robyn Haber, MD  hydrochlorothiazide (HYDRODIURIL) 25 MG tablet Take 1 tablet (25 mg total) by mouth daily. 07/09/13   Harvie Heck, PA-C  insulin glargine (LANTUS) 100 UNIT/ML injection Inject 10 Units into the skin daily.    Historical Provider, MD  levETIRAcetam (KEPPRA) 500 MG tablet Take 2 tablets (1,000 mg total) by mouth 2 (two) times daily. 12/30/14   Nicole Pisciotta, PA-C  loratadine (CLARITIN) 10 MG tablet Take 10 mg by mouth daily.  03/03/14   Historical Provider, MD  Mefenamic Acid 250 MG CAPS Take 2 capsules (500 mg total) by mouth 3 (three) times daily. X 4 days 08/30/14   Mercedes Street, PA-C  meloxicam (MOBIC) 15 MG tablet Take 1 tablet (15 mg total) by mouth daily. 12/07/14   Tiffany Carlota Raspberry, PA-C  omeprazole (PRILOSEC) 20 MG capsule Take 1 capsule (20 mg total) by mouth 2 (two) times daily. 01/22/16   Konrad Felix, PA  oxyCODONE-acetaminophen (PERCOCET) 10-325 MG per tablet Take 1 tablet by mouth every 4 (four) hours as needed for pain.    Historical Provider, MD  oxyCODONE-acetaminophen (PERCOCET) 10-325 MG tablet Take 1 tablet by mouth every 4 (four) hours as needed for pain. 10/31/15   Adrian Prows, MD  PARoxetine (PAXIL) 40 MG tablet Take 1 tablet (40 mg total) by mouth daily. 07/09/13   Harvie Heck, PA-C  promethazine (PHENERGAN) 6.25 MG/5ML syrup Take 10 mLs (12.5 mg total) by mouth every 8 (eight) hours as needed for nausea or vomiting. 06/16/16   Dorie Rank, MD  propranolol (INDERAL) 10 MG tablet Take 10 mg by mouth 2 (two) times daily.  01/28/14   Historical Provider, MD  sitaGLIPtin (JANUVIA) 50 MG tablet Take 1 tablet (50 mg total) by mouth daily. 06/17/16   Robyn Haber, MD  sodium chloride (OCEAN) 0.65 % nasal spray Place 1 spray into the nose 2 (two) times daily as needed for congestion.     Historical Provider, MD  triamcinolone  ointment (KENALOG) 0.5 % Apply 1 application topically 2 (two) times daily.    Historical Provider, MD  ZOLMitriptan (ZOMIG) 2.5 MG tablet Take 1 tablet by mouth 4 (four) times daily. 03/25/15   Historical Provider, MD    Family History Family History  Problem Relation Age of Onset  . Other Mother     varicose vein  . Asthma Mother   . High blood pressure Mother   . Cancer Father   . Cancer    . Diabetes    .  High blood pressure    . Asthma    . Thyroid disease    . Deep vein thrombosis Neg Hx   . Pulmonary embolism Neg Hx     Social History Social History  Substance Use Topics  . Smoking status: Current Every Day Smoker    Packs/day: 0.25    Years: 18.00    Types: Cigarettes  . Smokeless tobacco: Never Used  . Alcohol use No     Allergies   Ibuprofen; Clindamycin/lincomycin; Coconut flavor; Doxycycline hyclate; Flagyl [metronidazole]; Latex; Other; and Aspirin   Review of Systems Review of Systems  Constitutional: Negative.   Endocrine: Negative.      Physical Exam Triage Vital Signs ED Triage Vitals  Enc Vitals Group     BP      Pulse      Resp      Temp      Temp src      SpO2      Weight      Height      Head Circumference      Peak Flow      Pain Score      Pain Loc      Pain Edu?      Excl. in [place]?    No data found.   Updated Vital Signs BP 146/80 (BP Location: Right Arm) Comment (BP Location): large cuff  Pulse 84   Temp 99.5 F (37.5 C) (Oral)   Resp 20   LMP 06/01/2016   SpO2 98%    Physical Exam  Constitutional: She is oriented to person, place, and time. She appears well-developed and well-nourished.  HENT:  Right Ear: External ear normal.  Left Ear: External ear normal.  Mouth/Throat: Oropharynx is clear and moist.  Eyes: Conjunctivae are normal. Pupils are equal, round, and reactive to light.  Neck: Normal range of motion. Neck supple.  Pulmonary/Chest: Effort normal.  Musculoskeletal: Normal range of motion.    Neurological: She is alert and oriented to person, place, and time.  Skin: Skin is warm and dry.  Nursing note and vitals reviewed.    UC Treatments / Results  Labs (all labs ordered are listed, but only abnormal results are displayed) Labs Reviewed - No data to display  EKG  EKG Interpretation None       Radiology Ct Abdomen Pelvis Wo Contrast  Result Date: 06/16/2016 CLINICAL DATA:  Vomiting, constipation, and abdominal pain for 1 week. EXAM: CT ABDOMEN AND PELVIS WITHOUT CONTRAST TECHNIQUE: Multidetector CT imaging of the abdomen and pelvis was performed following the standard protocol without IV contrast. COMPARISON:  August 02, 2013 FINDINGS: Lower chest: No acute abnormality. Hepatobiliary: No hepatic injury or perihepatic hematoma. Gallbladder is unremarkable Pancreas: Unremarkable. No pancreatic ductal dilatation or surrounding inflammatory changes. Spleen: Normal in size without focal abnormality. Adrenals/Urinary Tract: Adrenal glands are unremarkable. Kidneys are normal, without renal calculi, focal lesion, or hydronephrosis. Bladder is unremarkable. Stomach/Bowel: The stomach and small bowel is normal. The colon and appendix are normal. Vascular/Lymphatic: Mild atherosclerosis in the abdominal aorta with no adenopathy. Reproductive: Apparent uterine fibroids, incompletely evaluated without contrast. No adnexal masses identified. Other: No free air. There is an umbilical hernia. Low-attenuation in the hernia sac appears to represent fluid, not seen previously. No other free fluid identified. Musculoskeletal: No acute or significant osseous findings. IMPRESSION: 1. There is a small periumbilical hernia. There is a small amount of fluid in the hernia sac which is nonspecific but new since April 2015.  No other free fluid in the abdomen. 2. Fibroid uterus, incompletely evaluated. The left ovary is more prominent in size than the right but no suspicious adnexal masses are seen. Pelvic  ultrasound could better evaluate the pelvis and ovaries if warranted. 3. Mild atherosclerotic changes. 4. No other acute abnormalities. Electronically Signed   By: Dorise Bullion III M.D   On: 06/16/2016 15:59    Procedures Procedures (including critical care time)  Medications Ordered in UC Medications - No data to display   Initial Impression / Assessment and Plan / UC Course  I have reviewed the triage vital signs and the nursing notes.  Pertinent labs & imaging results that were available during my care of the patient were reviewed by me and considered in my medical decision making (see chart for details).     Final Clinical Impressions(s) / UC Diagnoses   Final diagnoses:  Medication refill    New Prescriptions Current Discharge Medication List    Januvia and test strips prescriptions refilled   Robyn Haber, MD 06/17/16 1257

## 2016-06-17 NOTE — ED Triage Notes (Signed)
Out of januvia and test strips.  Patient missed last appt with pcp due to a death in the family-out of town.  Has an appt 4/15 with pcp

## 2016-06-20 LAB — HIV ANTIBODY (ROUTINE TESTING W REFLEX)

## 2016-07-11 ENCOUNTER — Ambulatory Visit (HOSPITAL_COMMUNITY)
Admission: EM | Admit: 2016-07-11 | Discharge: 2016-07-11 | Disposition: A | Discharge status: Home / Self Care | Payer: Medicaid Other | Attending: Family Medicine | Admitting: Family Medicine

## 2016-07-11 ENCOUNTER — Encounter (HOSPITAL_COMMUNITY): Payer: Self-pay | Admitting: Emergency Medicine

## 2016-07-11 DIAGNOSIS — R1013 Epigastric pain: Secondary | ICD-10-CM | POA: Diagnosis not present

## 2016-07-11 MED ORDER — FAMOTIDINE 20 MG PO TABS
20.0000 mg | ORAL_TABLET | Freq: Once | ORAL | Status: AC
  Administered 2016-07-11: 20 mg via ORAL

## 2016-07-11 MED ORDER — OMEPRAZOLE 40 MG PO CPDR: 40.0000 mg | DELAYED_RELEASE_CAPSULE | Freq: Every day | ORAL | 0 refills | Status: DC

## 2016-07-11 MED ORDER — GI COCKTAIL ~~LOC~~
ORAL | Status: AC
Start: 2016-07-11 — End: 2016-07-11
  Filled 2016-07-11: qty 30

## 2016-07-11 MED ORDER — GI COCKTAIL ~~LOC~~
30.0000 mL | Freq: Once | ORAL | Status: AC
  Administered 2016-07-11: 30 mL via ORAL

## 2016-07-11 MED ORDER — RANITIDINE HCL 300 MG PO TABS: 300.0000 mg | ORAL_TABLET | Freq: Every day | ORAL | 0 refills | Status: DC

## 2016-07-11 MED ORDER — FAMOTIDINE 20 MG PO TABS
ORAL_TABLET | ORAL | Status: AC
  Filled 2016-07-11: qty 1

## 2016-07-11 NOTE — Discharge Instructions (Signed)
For your H. pylori infection, I'm going to recommend you follow-up with your primary care provider as her she may have a preference as to specialists to follow up with. He will continue to have symptoms, until you were treated with antibiotics. Due to her multiple allergies, I believe you need an expert's opinion regarding management because you need more than one antibiotic to treat this. I have refilled your Prilosec, and I have started you on Zantac as well. I recommend you follow-up with either primary care, GI, or infectious disease.

## 2016-07-11 NOTE — ED Provider Notes (Signed)
CSN: [id number]     Arrival date & time 07/11/16  1025 History   First MD Initiated Contact with Patient 07/11/16 1137     Chief Complaint  Patient presents with  . Abdominal Pain   (Consider location/radiation/quality/duration/timing/severity/associated sxs/prior Treatment) 41 year old female presents to clinic for evaluation of abdominal pain. States she was diagnosed with H. pylori 2 weeks ago, was given a prescription for Prilosec, however she is not started on antibiotics due to multiple allergies to all first-line medications. She is out of her Prilosec, unable to take bismuth subsalicylate due to allergy   The history is provided by the patient.  Abdominal Pain  Pain location:  Epigastric Pain quality: aching, burning, cramping, gnawing and sharp   Pain radiates to:  Does not radiate Pain severity:  Severe Onset quality:  Gradual Timing:  Constant Progression:  Worsening Chronicity:  Chronic Context: eating   Context: not alcohol use   Relieved by:  Antacids Worsened by:  Nothing Associated symptoms: anorexia   Associated symptoms: no chest pain, no chills, no cough, no diarrhea, no dysuria, no fatigue, no melena, no nausea, no shortness of breath and no vomiting     Past Medical History:  Diagnosis Date  . Allergic rhinitis   . Arthritis   . Asthma    as child  . Chronic back pain   . Cigarette nicotine dependence with withdrawal   . DVT (deep venous thrombosis) ([place])   . Gallstones    s/p cholecystectomy  . GERD (gastroesophageal reflux disease)   . HIV (human immunodeficiency virus infection) ([place])   . HTN (hypertension)   . Meningioma ([place])   . Sciatic pain   . Seizure disorder, grand mal ([place])    dx 2005  . Seizures ([place])    due to head trauma as adult   Past Surgical History:  Procedure Laterality Date  . BRAIN SURGERY     2013 to remove a meningioma  . CHOLECYSTECTOMY    . PERIPHERAL VASCULAR CATHETERIZATION N/A 10/31/2015   Procedure: IVC Filter  Removal;  Surgeon: Adrian Prows, MD;  Location: [facility];  Service: Cardiovascular;  Laterality: N/A;  . PERIPHERAL VASCULAR CATHETERIZATION  10/31/2015   Procedure: IVC/SVC Venography;  Surgeon: Adrian Prows, MD;  Location: [facility];  Service: Cardiovascular;;   Family History  Problem Relation Age of Onset  . Other Mother     varicose vein  . Asthma Mother   . High blood pressure Mother   . Cancer Father   . Cancer    . Diabetes    . High blood pressure    . Asthma    . Thyroid disease    . Deep vein thrombosis Neg Hx   . Pulmonary embolism Neg Hx    Social History  Substance Use Topics  . Smoking status: Current Every Day Smoker    Packs/day: 0.25    Years: 18.00    Types: Cigarettes  . Smokeless tobacco: Never Used  . Alcohol use No   OB History    Gravida Para Term Preterm AB Living   3 1 1   2 1    SAB TAB Ectopic Multiple Live Births     2           Review of Systems  Constitutional: Negative for chills and fatigue.  Respiratory: Negative for cough and shortness of breath.   Cardiovascular: Negative for chest pain.  Gastrointestinal: Positive for abdominal pain and anorexia. Negative for diarrhea,  melena, nausea and vomiting.  Genitourinary: Negative for dysuria.  All other systems reviewed and are negative.   Allergies  Ibuprofen; Clindamycin/lincomycin; Coconut flavor; Doxycycline hyclate; Flagyl [metronidazole]; Latex; Other; and Aspirin  Home Medications   Prior to Admission medications   Medication Sig Start Date End Date Taking? Authorizing Provider  albuterol (PROVENTIL HFA;VENTOLIN HFA) 108 (90 BASE) MCG/ACT inhaler Inhale 2 puffs into the lungs every 4 (four) hours as needed for wheezing or shortness of breath. 08/02/14  Yes Britt Bottom, NP  ALPRAZolam Duanne Moron) 1 MG tablet Take 2 tablets (2 mg total) by mouth 3 (three) times daily as needed for anxiety. 12/30/14  Yes Nicole Pisciotta, PA-C  ALPRAZolam (XANAX) 1 MG tablet Take 1  tablet (1 mg total) by mouth 3 (three) times daily as needed for anxiety. 11/03/15  Yes Elnora Morrison, MD  cyclobenzaprine (FLEXERIL) 10 MG tablet Take 1 tablet (10 mg total) by mouth 2 (two) times daily as needed for muscle spasms. 02/01/13  Yes Larence Penning, MD  divalproex (DEPAKOTE ER) 500 MG 24 hr tablet Take 6 tablets (3,000 mg total) by mouth daily. 01/22/16  Yes Pieter Partridge, DO  folic acid (FOLVITE) 1 MG tablet Take 1 mg by mouth daily. Reported on 09/06/2015   Yes Historical Provider, MD  gabapentin (NEURONTIN) 400 MG capsule Take 800 mg by mouth 3 (three) times daily.    Yes Historical Provider, MD  glucose blood test strip Use as instructed 06/17/16  Yes Robyn Haber, MD  hydrochlorothiazide (HYDRODIURIL) 25 MG tablet Take 1 tablet (25 mg total) by mouth daily. 07/09/13  Yes Harvie Heck, PA-C  levETIRAcetam (KEPPRA) 500 MG tablet Take 2 tablets (1,000 mg total) by mouth 2 (two) times daily. 12/30/14  Yes Nicole Pisciotta, PA-C  loratadine (CLARITIN) 10 MG tablet Take 10 mg by mouth daily.  03/03/14  Yes Historical Provider, MD  Mefenamic Acid 250 MG CAPS Take 2 capsules (500 mg total) by mouth 3 (three) times daily. X 4 days 08/30/14  Yes Mercedes Street, PA-C  meloxicam (MOBIC) 15 MG tablet Take 1 tablet (15 mg total) by mouth daily. 12/07/14  Yes Tiffany Carlota Raspberry, PA-C  oxyCODONE-acetaminophen (PERCOCET) 10-325 MG per tablet Take 1 tablet by mouth every 4 (four) hours as needed for pain.   Yes Historical Provider, MD  oxyCODONE-acetaminophen (PERCOCET) 10-325 MG tablet Take 1 tablet by mouth every 4 (four) hours as needed for pain. 10/31/15  Yes Adrian Prows, MD  PARoxetine (PAXIL) 40 MG tablet Take 1 tablet (40 mg total) by mouth daily. 07/09/13  Yes Harvie Heck, PA-C  promethazine (PHENERGAN) 6.25 MG/5ML syrup Take 10 mLs (12.5 mg total) by mouth every 8 (eight) hours as needed for nausea or vomiting. 06/16/16  Yes Dorie Rank, MD  propranolol (INDERAL) 10 MG tablet Take 10 mg by mouth 2 (two) times  daily.  01/28/14  Yes Historical Provider, MD  sitaGLIPtin (JANUVIA) 50 MG tablet Take 1 tablet (50 mg total) by mouth daily. 06/17/16  Yes Robyn Haber, MD  sodium chloride (OCEAN) 0.65 % nasal spray Place 1 spray into the nose 2 (two) times daily as needed for congestion.    Yes Historical Provider, MD  triamcinolone ointment (KENALOG) 0.5 % Apply 1 application topically 2 (two) times daily.   Yes Historical Provider, MD  ZOLMitriptan (ZOMIG) 2.5 MG tablet Take 1 tablet by mouth 4 (four) times daily. 03/25/15  Yes Historical Provider, MD  albuterol (PROVENTIL) (2.5 MG/3ML) 0.083% nebulizer solution Take 2.5 mg by nebulization every 6 (six)  hours as needed for wheezing or shortness of breath.    Historical Provider, MD  amphetamine-dextroamphetamine (ADDERALL XR) 30 MG 24 hr capsule Take 30 mg by mouth 2 (two) times daily.    Historical Provider, MD  beclomethasone (QVAR) 40 MCG/ACT inhaler Inhale 2 puffs into the lungs 2 (two) times daily as needed (for shortness of breath).     Historical Provider, MD  insulin glargine (LANTUS) 100 UNIT/ML injection Inject 10 Units into the skin daily.    Historical Provider, MD  omeprazole (PRILOSEC) 40 MG capsule Take 1 capsule (40 mg total) by mouth daily. 07/11/16   Barnet Glasgow, NP  ranitidine (ZANTAC) 300 MG tablet Take 1 tablet (300 mg total) by mouth at bedtime. 07/11/16   Barnet Glasgow, NP   Meds Ordered and Administered this Visit   Medications  gi cocktail (Maalox,Lidocaine,Donnatal) (30 mLs Oral Given 07/11/16 1214)  famotidine (PEPCID) tablet 20 mg (20 mg Oral Given 07/11/16 1214)    BP 138/75 (BP Location: Left Arm)   Pulse 85   Temp 98.8 F (37.1 C) (Oral)   Resp 20   LMP 06/19/2016   SpO2 100%  No data found.   Physical Exam  Constitutional: She is oriented to person, place, and time. She appears well-developed and well-nourished. No distress.  HENT:  Head: Normocephalic.  Right Ear: External ear normal.  Left Ear: External  ear normal.  Cardiovascular: Normal rate and regular rhythm.   Pulmonary/Chest: Effort normal and breath sounds normal.  Abdominal: Normal appearance and bowel sounds are normal. There is no hepatosplenomegaly. There is tenderness in the epigastric area. There is no rigidity, no guarding, no CVA tenderness, no tenderness at McBurney's point and negative Murphy's sign.  Neurological: She is alert and oriented to person, place, and time.  Skin: Skin is warm and dry. Capillary refill takes less than 2 seconds. She is not diaphoretic.  Psychiatric: She has a normal mood and affect. Her behavior is normal.  Nursing note and vitals reviewed.   Urgent Care Course     Procedures (including critical care time)  Labs Review Labs Reviewed - No data to display  Imaging Review No results found.      MDM   1. Epigastric pain    Deferred offering antibiotics, due to her multiple allergies. Refilled her Prilosec prescription, started her on Zantac, gave her a GI cocktail and Pepcid in clinic. Recommended she follow up with her primary care provider, a GI specialist, or infectious disease specialist for further management of her condition    Barnet Glasgow, NP 07/11/16 1231

## 2016-07-11 NOTE — ED Triage Notes (Signed)
Here for constant lower abd pain onset 2 weeks ++   Reports PCP has dx w/H. Pylori but pt sts she is allergic "to all antibiotics"  Was told to come due to pain  Also seen at [facility] ED for similar sx  A&O x4... NAD

## 2016-07-12 ENCOUNTER — Encounter (HOSPITAL_COMMUNITY): Payer: Self-pay | Admitting: *Deleted

## 2016-07-12 ENCOUNTER — Emergency Department (HOSPITAL_COMMUNITY)
Admission: EM | Admit: 2016-07-12 | Discharge: 2016-07-12 | Disposition: A | Discharge status: Home / Self Care | Payer: Medicaid Other | Attending: Emergency Medicine | Admitting: Emergency Medicine

## 2016-07-12 DIAGNOSIS — R1013 Epigastric pain: Secondary | ICD-10-CM | POA: Diagnosis not present

## 2016-07-12 DIAGNOSIS — Z91040 Latex allergy status: Secondary | ICD-10-CM | POA: Insufficient documentation

## 2016-07-12 DIAGNOSIS — Z794 Long term (current) use of insulin: Secondary | ICD-10-CM | POA: Insufficient documentation

## 2016-07-12 DIAGNOSIS — I10 Essential (primary) hypertension: Secondary | ICD-10-CM | POA: Diagnosis not present

## 2016-07-12 DIAGNOSIS — J45909 Unspecified asthma, uncomplicated: Secondary | ICD-10-CM | POA: Diagnosis not present

## 2016-07-12 DIAGNOSIS — F17210 Nicotine dependence, cigarettes, uncomplicated: Secondary | ICD-10-CM | POA: Diagnosis not present

## 2016-07-12 LAB — URINALYSIS, ROUTINE W REFLEX MICROSCOPIC
BILIRUBIN URINE: NEGATIVE
Bacteria, UA: NONE SEEN
GLUCOSE, UA: NEGATIVE mg/dL
KETONES UR: NEGATIVE mg/dL
LEUKOCYTES UA: NEGATIVE
Nitrite: NEGATIVE
PH: 7 (ref 5.0–8.0)
Protein, ur: NEGATIVE mg/dL
Specific Gravity, Urine: 1.018 (ref 1.005–1.030)

## 2016-07-12 LAB — CBC
HCT: 36.4 % (ref 36.0–46.0)
HEMOGLOBIN: 12.3 g/dL (ref 12.0–15.0)
MCH: 30.1 pg (ref 26.0–34.0)
MCHC: 33.8 g/dL (ref 30.0–36.0)
MCV: 89 fL (ref 78.0–100.0)
PLATELETS: 242 10*3/uL (ref 150–400)
RBC: 4.09 MIL/uL (ref 3.87–5.11)
RDW: 14 % (ref 11.5–15.5)
WBC: 6.3 10*3/uL (ref 4.0–10.5)

## 2016-07-12 LAB — COMPREHENSIVE METABOLIC PANEL
ALK PHOS: 61 U/L (ref 38–126)
ALT: 12 U/L — ABNORMAL LOW (ref 14–54)
ANION GAP: 10 (ref 5–15)
AST: 17 U/L (ref 15–41)
Albumin: 3.8 g/dL (ref 3.5–5.0)
BUN: 8 mg/dL (ref 6–20)
CO2: 23 mmol/L (ref 22–32)
Calcium: 9.1 mg/dL (ref 8.9–10.3)
Chloride: 103 mmol/L (ref 101–111)
Creatinine, Ser: 1.01 mg/dL — ABNORMAL HIGH (ref 0.44–1.00)
GFR calc non Af Amer: >60 mL/min (ref >60)
Glucose, Bld: 125 mg/dL — ABNORMAL HIGH (ref 65–99)
Potassium: 3.7 mmol/L (ref 3.5–5.1)
SODIUM: 136 mmol/L (ref 135–145)
Total Bilirubin: 0.4 mg/dL (ref 0.3–1.2)
Total Protein: 7.2 g/dL (ref 6.5–8.1)

## 2016-07-12 LAB — LIPASE, BLOOD: LIPASE: 26 U/L (ref 11–51)

## 2016-07-12 MED ORDER — GI COCKTAIL ~~LOC~~: 30.0000 mL | Freq: Once | ORAL | Status: DC

## 2016-07-12 NOTE — ED Notes (Signed)
ED Provider at bedside. 

## 2016-07-12 NOTE — ED Triage Notes (Signed)
Pt in from home reports PCP recently treated her for h pylori, pt reports allergic reaction to all antibiotics, pt reports having blood in vomit today, pt told by PCP to come here, denies diarrhea, reports recent constipation, LBM today, A&O x4

## 2016-07-12 NOTE — ED Notes (Signed)
Pt states she is leaving.  Encouraged pt to stay to be seen.  Triage process explained to pt because she was concerned other people had been back in front of her to a treatment room.  Pt agreed to wait.

## 2016-07-12 NOTE — ED Notes (Signed)
Pt found leaving with brother by her side walking down the hallway towards the front lobby. This RN questioned the pt about where she was going and pt stated she was "going to the lobby to check myself out." This RN encouraged pt to return to room to continue care and stated that a medication was ordered for her abd pain and pt stated that she doesn't want it and she wanted to leave because "y'all ain't helping me." This RN made MD aware. Pt signing out AMA.

## 2016-07-12 NOTE — ED Provider Notes (Signed)
[facility] DEPT Provider Note   CSN: [id number] Arrival date & time: 07/12/16  1751     History   Chief Complaint Chief Complaint  Patient presents with  . Abdominal Pain    HPI Lori Ford is a 41 y.o. female.  HPI  41 year old female history of HIV, seizure disorder, presents today complaining of epigastric pain. She states has been present for about a month. She has been seen by multiple physicians. She reports that her primary care doctor to test and told her she was positive for H. pylori. She reports she is unable to take any of the antibiotics to treat it. She has been taking Protonix. She was seen at [facility] yesterday where she had a GI cocktail and had her Prilosec refilled. She is followed by Dr. Dr. Tommy Medal from infectious disease. She reports that she has not seen a gastroenterologist today.  Past Medical History:  Diagnosis Date  . Allergic rhinitis   . Arthritis   . Asthma    as child  . Chronic back pain   . Cigarette nicotine dependence with withdrawal   . DVT (deep venous thrombosis) ([place])   . Gallstones    s/p cholecystectomy  . GERD (gastroesophageal reflux disease)   . HIV (human immunodeficiency virus infection) ([place])   . HTN (hypertension)   . Meningioma ([place])   . Sciatic pain   . Seizure disorder, grand mal ([place])    dx 2005  . Seizures ([place])    due to head trauma as adult    Patient Active Problem List   Diagnosis Date Noted  . 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 Chest pain 12/10/2014  . Chest wall pain 12/10/2014  . Left leg pain 12/10/2014  . Left knee pain 12/10/2014  . Nausea vomiting and diarrhea 12/10/2014  . Seizures ([place]) 12/10/2014  . Seizure ([place]) 12/10/2014  . Hyperkalemia 12/10/2014  . HIV disease ([place]) 05/11/2014  . Meningioma ([place]) 05/11/2014  . Major depression, recurrent, chronic ([place]) 05/11/2014  . Morbid obesity ([place]) 08/10/2013  . DVT, popliteal, acute, left 10/06/2012  . 24mm Pulmonary nodule,  right upper lobe.  repeat CT chest May 2015. 10/06/2012  . Asthma 10/06/2012  . Hypertension 10/06/2012  . GERD (gastroesophageal reflux disease) 10/06/2012  . Cigarette nicotine dependence with withdrawal 10/06/2012  . Seizure disorder, grand mal ([place]) 09/25/2012  . Brain tumor ([place]) 09/25/2012    Past Surgical History:  Procedure Laterality Date  . BRAIN SURGERY     2013 to remove a meningioma  . CHOLECYSTECTOMY    . PERIPHERAL VASCULAR CATHETERIZATION N/A 10/31/2015   Procedure: IVC Filter Removal;  Surgeon: Adrian Prows, MD;  Location: [facility];  Service: Cardiovascular;  Laterality: N/A;  . PERIPHERAL VASCULAR CATHETERIZATION  10/31/2015   Procedure: IVC/SVC Venography;  Surgeon: Adrian Prows, MD;  Location: [facility];  Service: Cardiovascular;;    OB History    Gravida Para Term Preterm AB Living   3 1 1   2 1    SAB TAB Ectopic Multiple Live Births     2             Home Medications    Prior to Admission medications   Medication Sig Start Date End Date Taking? Authorizing Provider  albuterol (PROVENTIL HFA;VENTOLIN HFA) 108 (90 BASE) MCG/ACT inhaler Inhale 2 puffs into the lungs every 4 (four) hours as needed for wheezing or shortness of breath. 08/02/14  Yes Britt Bottom, NP  albuterol (PROVENTIL) (2.5 MG/3ML) 0.083% nebulizer solution Take 2.5 mg by nebulization every 6 (six) hours as needed for wheezing or shortness of breath.   Yes Historical Provider, MD  ALPRAZolam Duanne Moron) 1 MG tablet Take 1 tablet (1 mg total) by mouth 3 (three) times daily as needed for anxiety. 11/03/15  Yes Elnora Morrison, MD  amphetamine-dextroamphetamine (ADDERALL XR) 30 MG 24 hr capsule Take 30 mg by mouth 2 (two) times daily.   Yes Historical Provider, MD  beclomethasone (QVAR) 40 MCG/ACT inhaler Inhale 2 puffs into the lungs 2 (two) times daily as needed (for shortness of breath).    Yes Historical Provider, MD  cyclobenzaprine (FLEXERIL) 10 MG tablet Take 1 tablet (10 mg  total) by mouth 2 (two) times daily as needed for muscle spasms. 02/01/13  Yes Larence Penning, MD  divalproex (DEPAKOTE ER) 500 MG 24 hr tablet Take 6 tablets (3,000 mg total) by mouth daily. 01/22/16  Yes Pieter Partridge, DO  folic acid (FOLVITE) 1 MG tablet Take 1 mg by mouth daily. Reported on 09/06/2015   Yes Historical Provider, MD  gabapentin (NEURONTIN) 400 MG capsule Take 800 mg by mouth 3 (three) times daily.    Yes Historical Provider, MD  hydrochlorothiazide (HYDRODIURIL) 25 MG tablet Take 1 tablet (25 mg total) by mouth daily. 07/09/13  Yes Harvie Heck, PA-C  levETIRAcetam (KEPPRA) 500 MG tablet Take 2 tablets (1,000 mg total) by mouth 2 (two) times daily. 12/30/14  Yes Nicole Pisciotta, PA-C  loratadine (CLARITIN) 10 MG tablet Take 10 mg by mouth daily.  03/03/14  Yes Historical Provider, MD  meloxicam (MOBIC) 15 MG tablet Take 1 tablet (15 mg total) by mouth daily. 12/07/14  Yes Tiffany Carlota Raspberry, PA-C  mirtazapine (REMERON) 15 MG tablet Take 15 mg by mouth at bedtime. 07/04/16  Yes Historical Provider, MD  omeprazole (PRILOSEC) 40 MG capsule Take 1 capsule (40 mg total) by mouth daily. 07/11/16  Yes Barnet Glasgow, NP  oxyCODONE-acetaminophen (PERCOCET) 10-325 MG tablet Take 1 tablet by mouth every 4 (four) hours as needed for pain. 10/31/15  Yes Adrian Prows, MD  PARoxetine (PAXIL) 40 MG tablet Take 1 tablet (40 mg total) by mouth daily. 07/09/13  Yes Harvie Heck, PA-C  promethazine (PHENERGAN) 6.25 MG/5ML syrup Take 10 mLs (12.5 mg total) by mouth every 8 (eight) hours as needed for nausea or vomiting. 06/16/16  Yes Dorie Rank, MD  propranolol (INDERAL) 10 MG tablet Take 10 mg by mouth 2 (two) times daily.  01/28/14  Yes Historical Provider, MD  ranitidine (ZANTAC) 300 MG tablet Take 1 tablet (300 mg total) by mouth at bedtime. 07/11/16  Yes Barnet Glasgow, NP  sitaGLIPtin (JANUVIA) 50 MG tablet Take 1 tablet (50 mg total) by mouth daily. 06/17/16  Yes Robyn Haber, MD  sodium chloride (OCEAN) 0.65  % nasal spray Place 1 spray into the nose 2 (two) times daily as needed for congestion.    Yes Historical Provider, MD  triamcinolone ointment (KENALOG) 0.5 % Apply 1 application topically 2 (two) times daily.   Yes Historical Provider, MD  Vitamin D, Ergocalciferol, (DRISDOL) 50000 units CAPS capsule Take 50,000 Units by mouth every 7 (seven) days. THURSDAYS 07/04/16  Yes Historical Provider, MD  ALPRAZolam Duanne Moron) 1 MG tablet Take 2 tablets (2 mg total) by mouth 3 (three) times daily as needed for anxiety. Patient not taking: Reported on 07/12/2016 12/30/14   [facility], PA-C  glucose blood test strip Use as instructed 06/17/16   Robyn Haber, MD  insulin glargine (LANTUS) 100 UNIT/ML injection Inject 10 Units into the skin daily.    Historical Provider, MD  Mefenamic Acid 250 MG CAPS Take 2 capsules (500 mg total) by mouth 3 (three) times daily. X 4 days Patient not taking: Reported on 07/12/2016 08/30/14   Reece Agar, PA-C    Family History Family History  Problem Relation Age of Onset  . Other Mother     varicose vein  . Asthma Mother   . High blood pressure Mother   . Cancer Father   . Cancer    . Diabetes    . High blood pressure    . Asthma    . Thyroid disease    . Deep vein thrombosis Neg Hx   . Pulmonary embolism Neg Hx     Social History Social History  Substance Use Topics  . Smoking status: Current Every Day Smoker    Packs/day: 0.25    Years: 18.00    Types: Cigarettes  . Smokeless tobacco: Never Used  . Alcohol use No     Allergies   Amoxicillin; Ibuprofen; Penicillins; Zofran [ondansetron]; Clindamycin/lincomycin; Coconut flavor; Doxycycline hyclate; Flagyl [metronidazole]; Latex; Other; and Aspirin   Review of Systems Review of Systems  All other systems reviewed and are negative.    Physical Exam Updated Vital Signs BP (!) 151/93 (BP Location: Right Arm)   Pulse 80   Temp 98.9 F (37.2 C) (Oral)   Resp 18   Ht 5\' 5"  (1.651 m)   Wt  107 kg   LMP 06/19/2016   SpO2 100%   BMI 39.27 kg/m   Physical Exam  Constitutional: She is oriented to person, place, and time. She appears well-developed and well-nourished. No distress.  HENT:  Head: Normocephalic and atraumatic.  Right Ear: External ear normal.  Left Ear: External ear normal.  Nose: Nose normal.  Eyes: Conjunctivae and EOM are normal. Pupils are equal, round, and reactive to light.  Neck: Normal range of motion. Neck supple.  Cardiovascular: Normal rate, regular rhythm, normal heart sounds and intact distal pulses.   Pulmonary/Chest: Effort normal.  Abdominal: Soft. Bowel sounds are normal. There is no tenderness.  Musculoskeletal: Normal range of motion.  Neurological: She is alert and oriented to person, place, and time. She exhibits normal muscle tone. Coordination normal.  Skin: Skin is warm and dry.  Psychiatric: She has a normal mood and affect. Her behavior is normal. Thought content normal.  Nursing note and vitals reviewed.    ED Treatments / Results  Labs (all labs ordered are listed, but only abnormal results are displayed) Labs Reviewed  COMPREHENSIVE METABOLIC PANEL - Abnormal; Notable for the following:       Result Value   Glucose, Bld 125 (*)    Creatinine, Ser 1.01 (*)    ALT 12 (*)    All other components within normal limits  LIPASE, BLOOD  CBC  URINALYSIS, ROUTINE W REFLEX MICROSCOPIC    EKG  EKG Interpretation None       Radiology No results found.  Procedures Procedures (including critical care time)  Medications Ordered in ED Medications - No data to display  EKG with normal lab evaluation here. Review of records reveal patient had a CT for same pain here on March 4. That showed a small. Umbilical hernia with a small amount of fluid in the hernia sac. Fibroid of her uterus, and mild atherosclerotic changes. Records report previous cholecystectomy. Laboratory values here normal with the exception of mild  hyperglycemia 125. CBC is normal. Lipase is normal. Initial Impression / Assessment and Plan / ED Course  I have reviewed the triage vital signs and the nursing notes.  Pertinent labs & imaging results that were available during my care of the patient were reviewed by me and considered in my medical decision making (see chart for details).     9:06 PM Nurse reports that patient is leaving stating that we are not doing anything for her.  Final Clinical Impressions(s) / ED Diagnoses   Final diagnoses:  Epigastric pain    New Prescriptions New Prescriptions   No medications on file     Pattricia Boss, MD 07/12/16 2107

## 2016-07-12 NOTE — ED Notes (Signed)
Pt states she is going outside to make a phone call.  Informed her they are cleaning her room and will be here to get her shortly.

## 2016-08-13 MED FILL — ACCU-CHEK AVIVA PLUS TEST S: 25 days supply | Qty: 100 | Fill #0

## 2016-08-19 ENCOUNTER — Other Ambulatory Visit: Payer: Self-pay | Admitting: Neurology

## 2016-08-20 ENCOUNTER — Other Ambulatory Visit: Payer: Self-pay | Admitting: Neurology

## 2016-08-22 ENCOUNTER — Other Ambulatory Visit: Payer: Self-pay | Admitting: Gastroenterology

## 2016-08-26 ENCOUNTER — Encounter (HOSPITAL_COMMUNITY): Payer: Self-pay | Admitting: Anesthesiology

## 2016-08-26 ENCOUNTER — Encounter (HOSPITAL_COMMUNITY): Payer: Self-pay

## 2016-08-26 ENCOUNTER — Ambulatory Visit (HOSPITAL_COMMUNITY): Admit: 2016-08-26 | Payer: Medicaid Other | Admitting: Gastroenterology

## 2016-08-26 ENCOUNTER — Encounter (HOSPITAL_COMMUNITY): Admission: RE | Payer: Self-pay | Source: Ambulatory Visit

## 2016-08-26 ENCOUNTER — Ambulatory Visit (HOSPITAL_COMMUNITY): Admission: RE | Admit: 2016-08-26 | Payer: Medicaid Other | Source: Ambulatory Visit | Admitting: Gastroenterology

## 2016-08-26 SURGERY — ESOPHAGOGASTRODUODENOSCOPY (EGD) WITH PROPOFOL
Anesthesia: Monitor Anesthesia Care

## 2016-08-26 NOTE — Anesthesia Preprocedure Evaluation (Deleted)
Anesthesia Evaluation    Reviewed: Allergy & Precautions, Patient's Chart, lab work & pertinent test results  Airway        Dental   Pulmonary asthma , Current Smoker,           Cardiovascular hypertension, Pt. on medications + Peripheral Vascular Disease       Neuro/Psych Seizures -,  PSYCHIATRIC DISORDERS Depression  Neuromuscular disease negative neurological ROS     GI/Hepatic Neg liver ROS, GERD  Medicated,  Endo/Other  diabetes, Type 1  Renal/GU negative Renal ROS  negative genitourinary   Musculoskeletal  (+) Arthritis , Osteoarthritis,    Abdominal   Peds negative pediatric ROS (+)  Hematology negative hematology ROS (+)   Anesthesia Other Findings Day of surgery medications reviewed with the patient.  Reproductive/Obstetrics negative OB ROS                             Anesthesia Physical Anesthesia Plan  ASA: III  Anesthesia Plan: MAC   Post-op Pain Management:    Induction: Intravenous  Airway Management Planned: Natural Airway  Additional Equipment:   Intra-op Plan:   Post-operative Plan:   Informed Consent: I have reviewed the patients History and Physical, chart, labs and discussed the procedure including the risks, benefits and alternatives for the proposed anesthesia with the patient or authorized representative who has indicated his/her understanding and acceptance.     Plan Discussed with: CRNA  Anesthesia Plan Comments:         Anesthesia Quick Evaluation

## 2016-08-29 ENCOUNTER — Encounter (HOSPITAL_COMMUNITY): Payer: Self-pay | Admitting: Cardiology

## 2016-09-13 IMAGING — CT CT HEAD W/O CM
2 series · 16 of 30 positions shown, 20 images · non-contrast
Comparison: 02/01/2013

CLINICAL DATA: Left-sided headache for 2 months

EXAM:
CT HEAD WITHOUT CONTRAST
TECHNIQUE: Contiguous axial images were obtained from the base of the skull
through the vertex without intravenous contrast.

[Series 201: head w/o, idose (1) · axial · non-contrast · 0.49mm/px · z∈[+97,+227]mm · 13 of 32 slices shown, 17 images]
[im 3/32  brain]
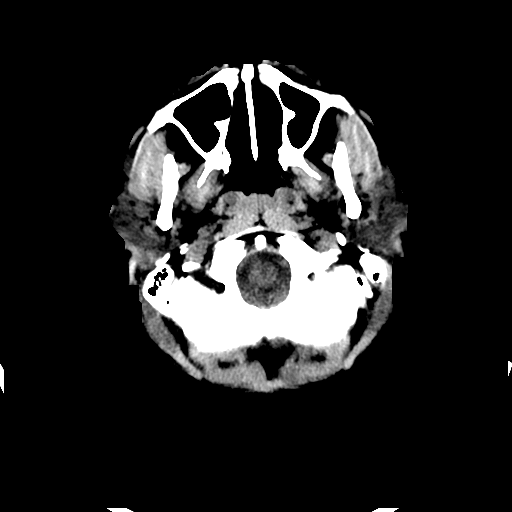
[im 3/32  bone]
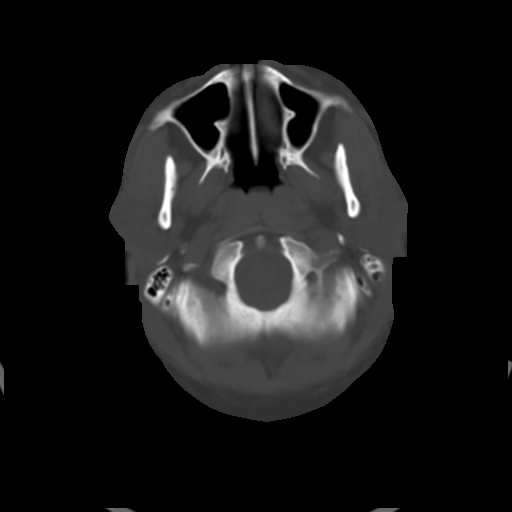
[im 5/32  brain]
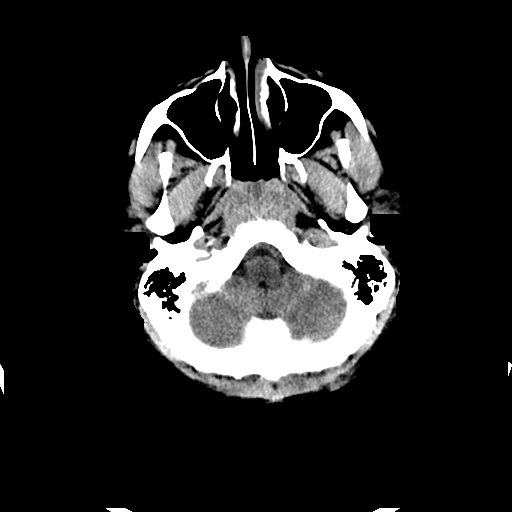
[im 7/32  brain]
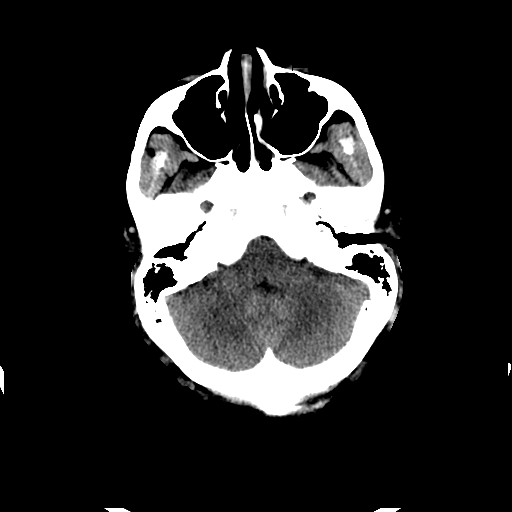
[im 9/32  brain]
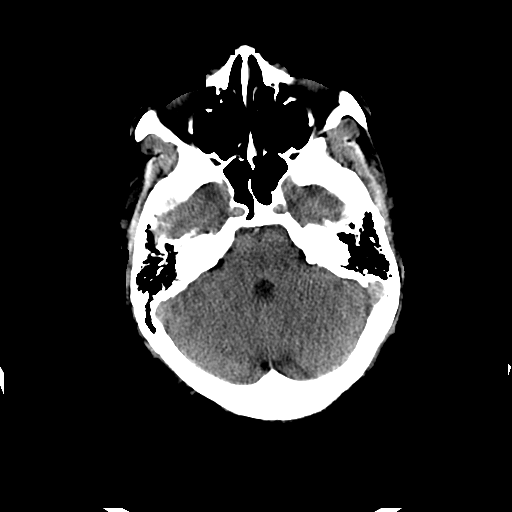
[im 12/32  brain]
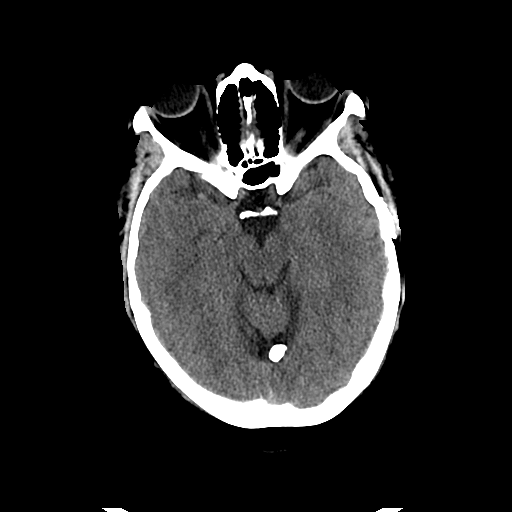
[im 12/32  bone]
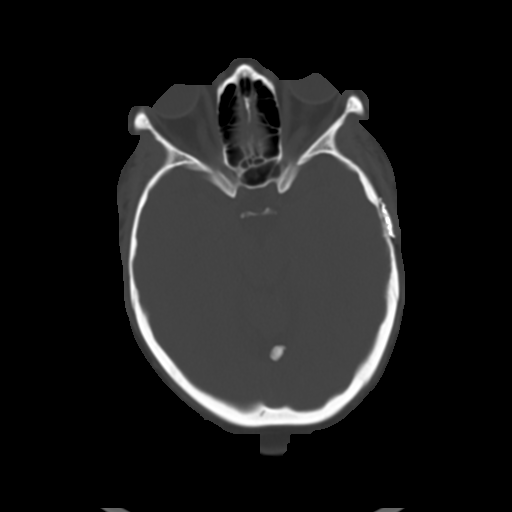
[im 14/32  brain]
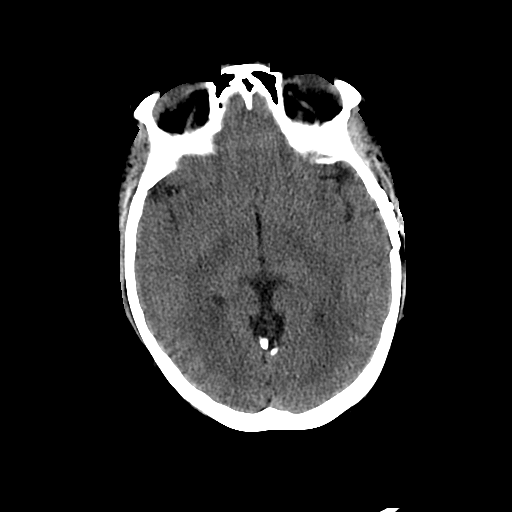
[im 16/32  brain]
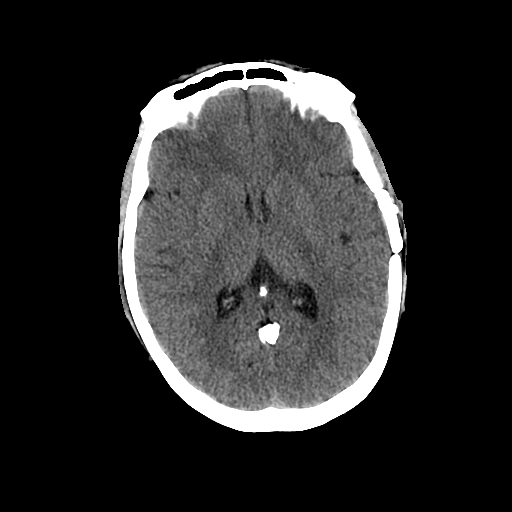
[im 18/32  brain]
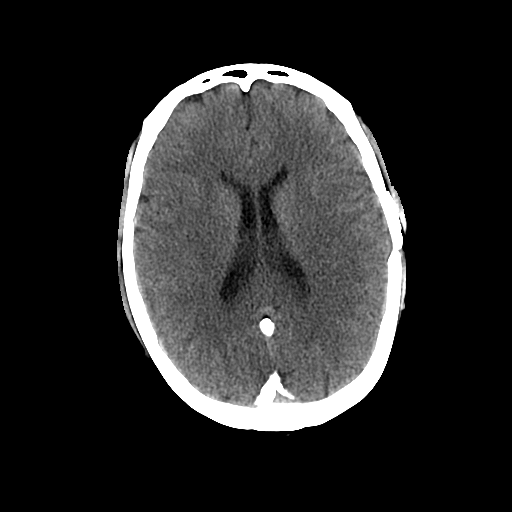
[im 20/32  brain]
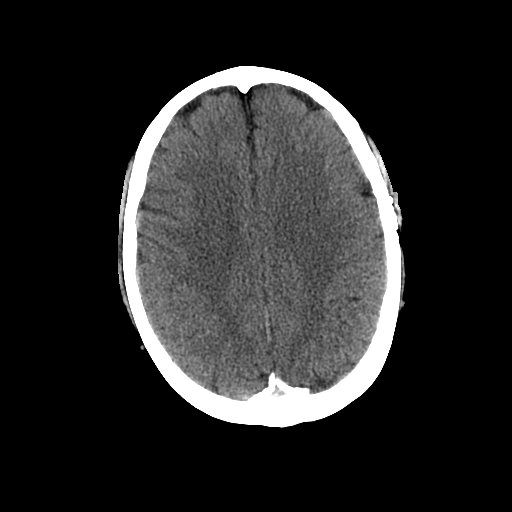
[im 20/32  bone]
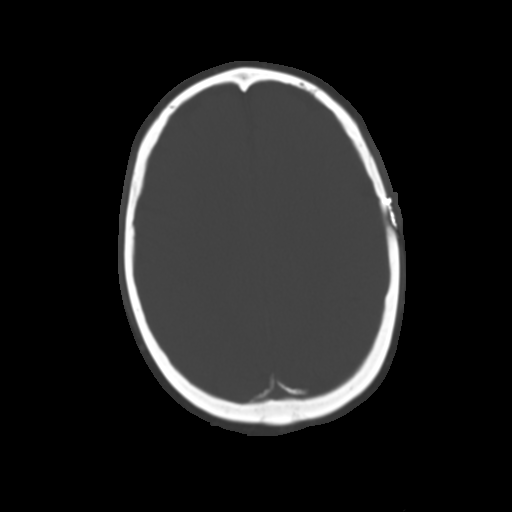
[im 23/32  brain]
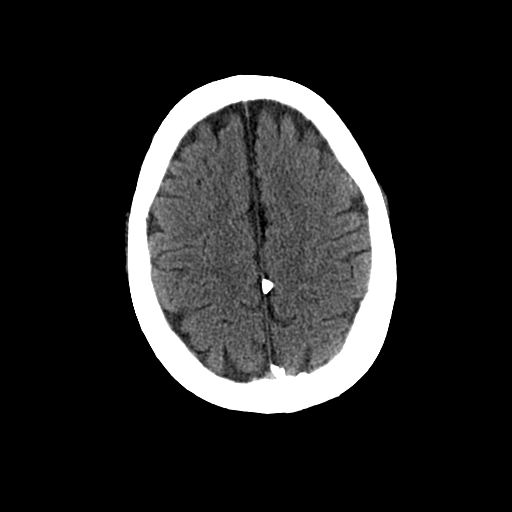
[im 25/32  brain]
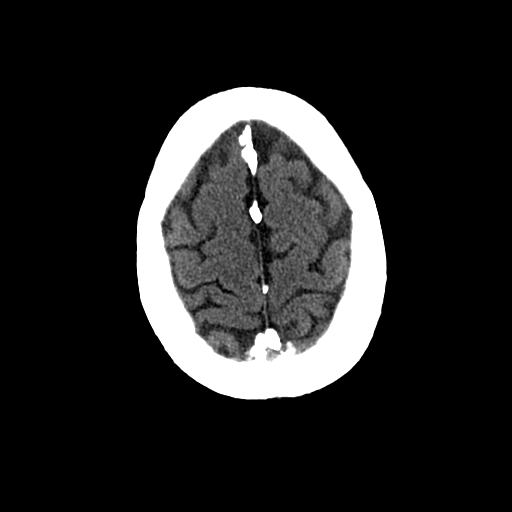
[im 27/32  brain]
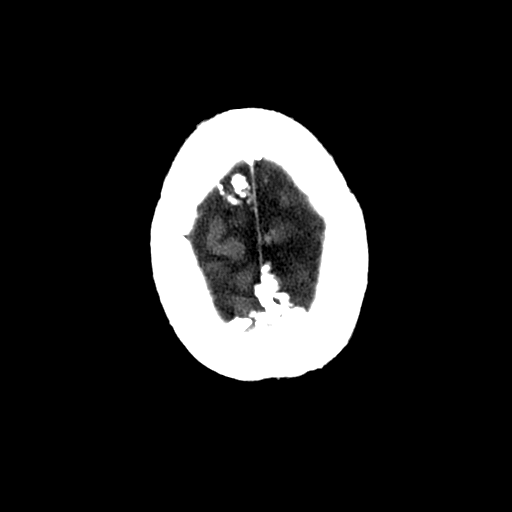
[im 29/32  brain]
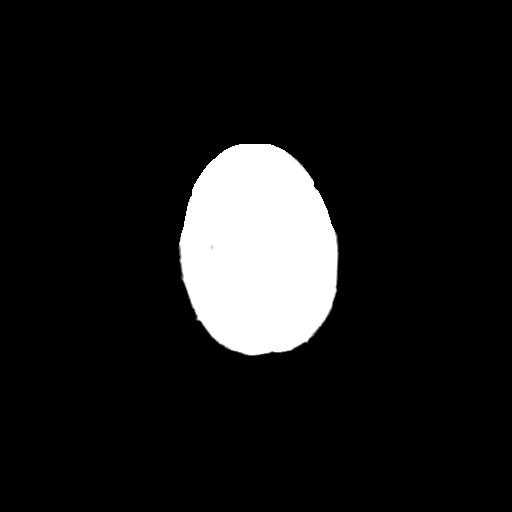
[im 29/32  bone]
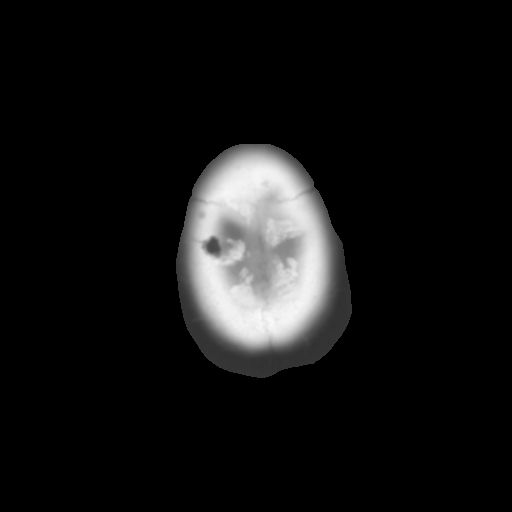

[Series 202: head w/o bone, idose (1) · axial · non-contrast · 0.49mm/px · z∈[+97,+142]mm · 3 of 32 slices shown]
[im 3/32  bone]
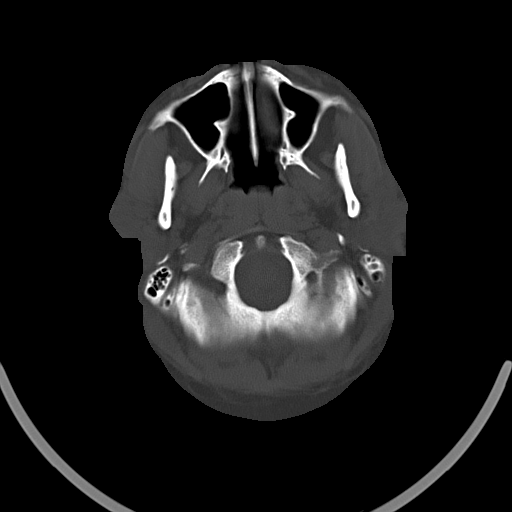
[im 7/32  bone]
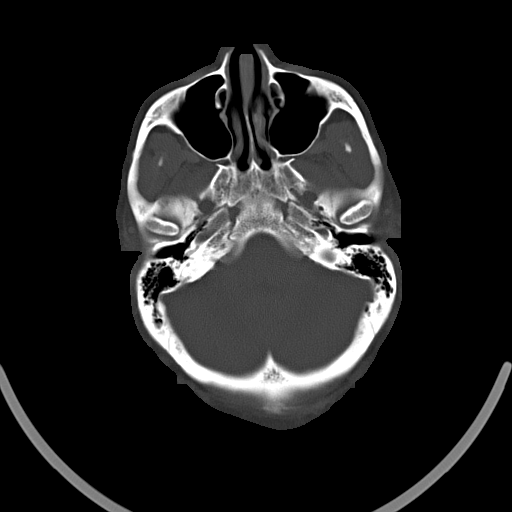
[im 12/32  bone]
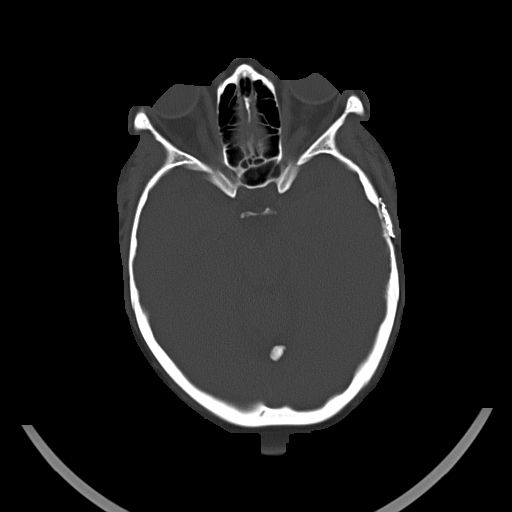

[16 of 30 positions shown; findings below may reference images not displayed]

FINDINGS: No mass effect, midline shift, or acute hemorrhage. Prominent
adenoidal lymphoid tissue. Mastoid air cells clear. Left parietal
craniotomy defect.
IMPRESSION: No acute intracranial pathology.

## 2016-09-25 MED FILL — ACCU-CHEK SOFTCLIX LANCETS: 25 days supply | Qty: 100 | Fill #0

## 2016-09-25 MED FILL — ACCU-CHEK AVIVA PLUS TEST S: 25 days supply | Qty: 100 | Fill #1

## 2016-10-12 ENCOUNTER — Encounter (HOSPITAL_COMMUNITY): Payer: Self-pay | Admitting: Emergency Medicine

## 2016-10-12 ENCOUNTER — Ambulatory Visit (HOSPITAL_COMMUNITY)
Admission: EM | Admit: 2016-10-12 | Discharge: 2016-10-12 | Disposition: A | Discharge status: Home / Self Care | Payer: Medicaid Other | Attending: Family Medicine | Admitting: Family Medicine

## 2016-10-12 DIAGNOSIS — R1013 Epigastric pain: Secondary | ICD-10-CM

## 2016-10-12 MED ORDER — RANITIDINE HCL 300 MG PO TABS: 300.0000 mg | ORAL_TABLET | Freq: Every day | ORAL | 2 refills | Status: DC

## 2016-10-12 MED ORDER — GI COCKTAIL ~~LOC~~
ORAL | Status: AC
  Filled 2016-10-12: qty 30

## 2016-10-12 MED ORDER — FAMOTIDINE 20 MG PO TABS
ORAL_TABLET | ORAL | Status: AC
  Filled 2016-10-12: qty 1

## 2016-10-12 MED ORDER — OMEPRAZOLE 40 MG PO CPDR: 40.0000 mg | DELAYED_RELEASE_CAPSULE | Freq: Two times a day (BID) | ORAL | 0 refills | Status: DC

## 2016-10-12 MED ORDER — GI COCKTAIL ~~LOC~~
30.0000 mL | Freq: Once | ORAL | Status: AC
  Administered 2016-10-12: 30 mL via ORAL

## 2016-10-12 MED ORDER — FAMOTIDINE 20 MG PO TABS
20.0000 mg | ORAL_TABLET | Freq: Once | ORAL | Status: AC
  Administered 2016-10-12: 20 mg via ORAL

## 2016-10-12 NOTE — ED Triage Notes (Signed)
Pt c/o constant abd pain onset 1 week that is getting worse.  Pt rates pain "1000/10"  Pain increases when lying and pressure  Sx also include nauseas, constipation x2 days.   Denies fevers, diarrhea  A&O x4... NAD... Ambulatory

## 2016-10-12 NOTE — ED Provider Notes (Signed)
CSN: [id number]     Arrival date & time 10/12/16  1551 History   None    Chief Complaint  Patient presents with  . Abdominal Pain   (Consider location/radiation/quality/duration/timing/severity/associated sxs/prior Treatment) Lori Ford is a 41 y.o. female with an extensive past medical history including HIV, H. pylori , asthma, and HTN amongst other conditions, who presents to the [facility] with a chief complaint of abdominal pain. She is followed by infectious disease for her HIV and has a PCP. She has not received antibiotics for H. Pylori as she has extensive drug allergies. She reports she ran out of her antacids a week ago and her pain has worsened.      Abdominal Pain  Associated symptoms: nausea and vomiting   Associated symptoms: no chills, no diarrhea and no fever     Past Medical History:  Diagnosis Date  . Allergic rhinitis   . Arthritis   . Asthma    as child  . Chronic back pain   . Cigarette nicotine dependence with withdrawal   . DVT (deep venous thrombosis) ([place])   . Gallstones    s/p cholecystectomy  . GERD (gastroesophageal reflux disease)   . HIV (human immunodeficiency virus infection) ([place])   . HTN (hypertension)   . Meningioma ([place])   . Sciatic pain   . Seizure disorder, grand mal ([place])    dx 2005  . Seizures ([place])    due to head trauma as adult   Past Surgical History:  Procedure Laterality Date  . BRAIN SURGERY     2013 to remove a meningioma  . CHOLECYSTECTOMY    . IVC FILTER REMOVAL N/A 10/31/2015   Procedure: IVC Filter Removal;  Surgeon: Adrian Prows, MD;  Location: [facility];  Service: Cardiovascular;  Laterality: N/A;  . PERIPHERAL VASCULAR CATHETERIZATION  10/31/2015   Procedure: IVC/SVC Venography;  Surgeon: Adrian Prows, MD;  Location: [facility];  Service: Cardiovascular;;   Family History  Problem Relation Age of Onset  . Other Mother        varicose vein  . Asthma Mother   . High blood pressure  Mother   . Cancer Father   . Cancer Unknown   . Diabetes Unknown   . High blood pressure Unknown   . Asthma Unknown   . Thyroid disease Unknown   . Deep vein thrombosis Neg Hx   . Pulmonary embolism Neg Hx    Social History  Substance Use Topics  . Smoking status: Current Every Day Smoker    Packs/day: 0.25    Years: 18.00    Types: Cigarettes  . Smokeless tobacco: Never Used  . Alcohol use No   OB History    Gravida Para Term Preterm AB Living   3 1 1   2 1    SAB TAB Ectopic Multiple Live Births     2           Review of Systems  Constitutional: Negative for chills and fever.  HENT: Negative.   Respiratory: Negative.   Gastrointestinal: Positive for abdominal pain, nausea and vomiting. Negative for diarrhea.  Musculoskeletal: Negative.   Skin: Negative.   Neurological: Negative.     Allergies  Amoxicillin; Ibuprofen; Penicillins; Zofran [ondansetron]; Clindamycin/lincomycin; Coconut flavor; Doxycycline hyclate; Flagyl [metronidazole]; Latex; Other; and Aspirin  Home Medications   Prior to Admission medications   Medication Sig Start Date End Date Taking? Authorizing Provider  albuterol (PROVENTIL HFA;VENTOLIN HFA) 108 (90  BASE) MCG/ACT inhaler Inhale 2 puffs into the lungs every 4 (four) hours as needed for wheezing or shortness of breath. 08/02/14  Yes Tysinger, Benjamine Mola, NP  amphetamine-dextroamphetamine (ADDERALL XR) 30 MG 24 hr capsule Take 30 mg by mouth 2 (two) times daily.   Yes [provider]  beclomethasone (QVAR) 40 MCG/ACT inhaler Inhale 2 puffs into the lungs 2 (two) times daily as needed (for shortness of breath).    Yes [provider]  cyclobenzaprine (FLEXERIL) 10 MG tablet Take 1 tablet (10 mg total) by mouth 2 (two) times daily as needed for muscle spasms. 02/01/13  Yes Larence Penning, MD  divalproex (DEPAKOTE ER) 500 MG 24 hr tablet TAKE 6 TABLETS (3,000 MG TOTAL) BY MOUTH DAILY. 08/19/16  Yes Tomi Likens, Adam R, DO  folic acid (FOLVITE) 1  MG tablet Take 1 mg by mouth daily. Reported on 09/06/2015   Yes [provider]  gabapentin (NEURONTIN) 400 MG capsule Take 800 mg by mouth 3 (three) times daily.    Yes [provider]  glucose blood test strip Use as instructed 06/17/16  Yes Robyn Haber, MD  hydrochlorothiazide (HYDRODIURIL) 25 MG tablet Take 1 tablet (25 mg total) by mouth daily. 07/09/13  Yes Harvie Heck, PA-C  levETIRAcetam (KEPPRA) 500 MG tablet Take 2 tablets (1,000 mg total) by mouth 2 (two) times daily. 12/30/14  Yes Pisciotta, Elmyra Ricks, PA-C  loratadine (CLARITIN) 10 MG tablet Take 10 mg by mouth daily.  03/03/14  Yes [provider]  Mefenamic Acid 250 MG CAPS Take 2 capsules (500 mg total) by mouth 3 (three) times daily. X 4 days 08/30/14  Yes Street, [place], PA-C  meloxicam (MOBIC) 15 MG tablet Take 1 tablet (15 mg total) by mouth daily. 12/07/14  Yes Carlota Raspberry, Tiffany, PA-C  mirtazapine (REMERON) 15 MG tablet Take 15 mg by mouth at bedtime. 07/04/16  Yes [provider]  oxyCODONE-acetaminophen (PERCOCET) 10-325 MG tablet Take 1 tablet by mouth every 4 (four) hours as needed for pain. 10/31/15  Yes Adrian Prows, MD  PARoxetine (PAXIL) 40 MG tablet Take 1 tablet (40 mg total) by mouth daily. 07/09/13  Yes Harvie Heck, PA-C  promethazine (PHENERGAN) 6.25 MG/5ML syrup Take 10 mLs (12.5 mg total) by mouth every 8 (eight) hours as needed for nausea or vomiting. 06/16/16  Yes Dorie Rank, MD  propranolol (INDERAL) 10 MG tablet Take 10 mg by mouth 2 (two) times daily.  01/28/14  Yes [provider]  sitaGLIPtin (JANUVIA) 50 MG tablet Take 1 tablet (50 mg total) by mouth daily. 06/17/16  Yes Robyn Haber, MD  Vitamin D, Ergocalciferol, (DRISDOL) 50000 units CAPS capsule Take 50,000 Units by mouth every 7 (seven) days. THURSDAYS 07/04/16  Yes [provider]  albuterol (PROVENTIL) (2.5 MG/3ML) 0.083% nebulizer solution Take 2.5 mg by nebulization every 6 (six) hours as needed for  wheezing or shortness of breath.    [provider]  insulin glargine (LANTUS) 100 UNIT/ML injection Inject 10 Units into the skin daily.    [provider]  omeprazole (PRILOSEC) 40 MG capsule Take 1 capsule (40 mg total) by mouth 2 (two) times daily. 10/12/16 10/26/16  Barnet Glasgow, NP  ranitidine (ZANTAC) 300 MG tablet Take 1 tablet (300 mg total) by mouth at bedtime. 10/12/16   Barnet Glasgow, NP  sodium chloride (OCEAN) 0.65 % nasal spray Place 1 spray into the nose 2 (two) times daily as needed for congestion.     [provider]  triamcinolone ointment (KENALOG) 0.5 %  Apply 1 application topically 2 (two) times daily.    [provider]   Meds Ordered and Administered this Visit   Medications  gi cocktail (Maalox,Lidocaine,Donnatal) (30 mLs Oral Given 10/12/16 1643)  famotidine (PEPCID) tablet 20 mg (20 mg Oral Given 10/12/16 1643)    BP (!) 128/95 (BP Location: Left Arm)   Pulse 97   Temp 98.2 F (36.8 C) (Oral)   Resp 16   LMP 10/12/2016   SpO2 100%  No data found.   Physical Exam  Constitutional: She is oriented to person, place, and time. She appears well-developed and well-nourished. No distress.  HENT:  Head: Normocephalic and atraumatic.  Right Ear: External ear normal.  Left Ear: External ear normal.  Eyes: Conjunctivae are normal.  Cardiovascular: Normal rate and regular rhythm.   Pulmonary/Chest: Effort normal and breath sounds normal.  Abdominal: Soft. Bowel sounds are normal. There is tenderness in the epigastric area. There is no rebound and no CVA tenderness.  Neurological: She is alert and oriented to person, place, and time.  Skin: Skin is warm and dry. Capillary refill takes less than 2 seconds. No rash noted. She is not diaphoretic. No erythema.  Psychiatric: She has a normal mood and affect. Her behavior is normal.  Nursing note and vitals reviewed.   Urgent Care Course     Procedures (including critical care  time)  Labs Review Labs Reviewed - No data to display  Imaging Review No results found.      MDM   1. Epigastric pain    Jaylene Arrowood is a 41 y.o. female with an extensive past medical history including HIV, H. pylori , asthma, and HTN amongst other conditions, who presents to the [facility] with a chief complaint of abdominal pain. She is followed by infectious disease for her HIV and has a PCP. She has not received antibiotics for H. Pylori as she has extensive drug allergies. She reports she ran out of her antacids a week ago and her pain has worsened.    VSS, NAD, no concerning physical exam findings suspicious of an acute abdomen. Given GI cocktail and pepcid in clinic. Refilled prilosec and zantac. Follow up with her pcp for further management. Strict follow up guidelines given to go to the er if symptoms worsen.      Barnet Glasgow, NP 10/12/16 [id number]

## 2016-10-12 NOTE — Discharge Instructions (Signed)
I have refilled your antacids. Take these as directed. If your pain persists, follow-up with primary care provider, if at any time it worsens go to the ER.

## 2016-11-14 ENCOUNTER — Encounter (HOSPITAL_COMMUNITY): Payer: Self-pay | Admitting: Emergency Medicine

## 2016-11-14 ENCOUNTER — Ambulatory Visit (HOSPITAL_COMMUNITY)
Admission: EM | Admit: 2016-11-14 | Discharge: 2016-11-14 | Disposition: A | Discharge status: Home / Self Care | Payer: Medicaid Other | Attending: Family Medicine | Admitting: Family Medicine

## 2016-11-14 DIAGNOSIS — J069 Acute upper respiratory infection, unspecified: Secondary | ICD-10-CM | POA: Diagnosis not present

## 2016-11-14 DIAGNOSIS — R05 Cough: Secondary | ICD-10-CM

## 2016-11-14 DIAGNOSIS — B9789 Other viral agents as the cause of diseases classified elsewhere: Secondary | ICD-10-CM | POA: Diagnosis not present

## 2016-11-14 MED ORDER — METHYLPREDNISOLONE SODIUM SUCC 125 MG IJ SOLR
80.0000 mg | Freq: Once | INTRAMUSCULAR | Status: AC
  Administered 2016-11-14: 80 mg via INTRAMUSCULAR

## 2016-11-14 MED ORDER — METHYLPREDNISOLONE SODIUM SUCC 125 MG IJ SOLR
INTRAMUSCULAR | Status: AC
  Filled 2016-11-14: qty 2

## 2016-11-14 MED ORDER — PROMETHAZINE-DM 6.25-15 MG/5ML PO SYRP: 5.0000 mL | ORAL_SOLUTION | Freq: Four times a day (QID) | ORAL | 0 refills | Status: DC | PRN

## 2016-11-14 MED ORDER — PROMETHAZINE HCL 6.25 MG/5ML PO SYRP: 12.5000 mg | ORAL_SOLUTION | Freq: Three times a day (TID) | ORAL | 0 refills | Status: DC | PRN

## 2016-11-14 MED ORDER — METHYLPREDNISOLONE ACETATE 80 MG/ML IJ SUSP
INTRAMUSCULAR | Status: AC
  Filled 2016-11-14: qty 1

## 2016-11-14 NOTE — ED Triage Notes (Signed)
PT reports cold and sinus problems for 3 days. PT is taking claritin and zyrtec. PT takes promethazine DM as well, but is out of it.

## 2016-11-14 NOTE — ED Provider Notes (Signed)
  [place]   [id number] 11/14/16 Arrival Time: 1109  ASSESSMENT & PLAN:  1. Viral URI with cough     Meds ordered this encounter  Medications  . promethazine (PHENERGAN) 6.25 MG/5ML syrup    Sig: Take 10 mLs (12.5 mg total) by mouth every 8 (eight) hours as needed for nausea or vomiting.    Dispense:  90 mL    Refill:  0  . methylPREDNISolone sodium succinate (SOLU-MEDROL) 125 mg/2 mL injection 80 mg   Instructed to watch blood sugar closely since she received IM steroid today. OTC as needed. Hopefull she will begin to feel better within the next few days. Will f/u with PCP if needed.  Reviewed expectations re: course of current medical issues. Questions answered. Outlined signs and symptoms indicating need for more acute intervention. Patient verbalized understanding. After Visit Summary given.   SUBJECTIVE:  Lori Ford is a 41 y.o. female who presents with complaint of 2-3 days of nasal congestion and dry cough. Some wheezing. Albuterol inhaler with mild relief. No current SOB. Afebrile. Overall fatigued. Promethazine DM has helped in the past. Requests refill. No n/v. Zyrtec daily since she has underlying seasonal allergies.  ROS: As per HPI.   OBJECTIVE:  Vitals:   11/14/16 1122 11/14/16 1124 11/14/16 1125  BP:   (!) 137/101  Pulse:  83   Resp:  16   Temp:  98.6 F (37 C)   TempSrc:  Oral   SpO2:  98%   Weight: 236 lb (107 kg)    Height: 5\' 3"  (1.6 m)       General appearance: alert; no distress HEENT: nasal congestion; clear runny nose; post-nasal drainage with minimal throat irritation Neck: supple without LAD Lungs: overall clear with a few expiratory wheezes and dry coughing Skin: warm and dry Psychological:  alert and cooperative; normal mood and affect   Allergies  Allergen Reactions  . Amoxicillin Anaphylaxis  . Ibuprofen Shortness Of Breath    wheezing  . Penicillins Anaphylaxis  . Zofran [Ondansetron] Nausea And Vomiting   Violent and uncontrolled  . Clindamycin/Lincomycin Hives  . Coconut Flavor Hives  . Doxycycline Hyclate   . Flagyl [Metronidazole] Other (See Comments)    "it gave me a real bad bacterial infection"  . Latex Other (See Comments)    Rash, wheezing  . Other Swelling    "GRAPE SUBSTANCE"  . Aspirin Palpitations    wheezing    PMHx, SurgHx, SocialHx, Medications, and Allergies were reviewed in the Visit Navigator and updated as appropriate.      Vanessa Kick, MD 11/14/16 [phone]

## 2017-01-07 IMAGING — DX DG CHEST 2V
2 series · 2 of 2 positions shown · non-contrast
Comparison: 03/29/2014

CLINICAL DATA: Cough and nasal congestion

EXAM:
CHEST  2 VIEW

[chest pa]
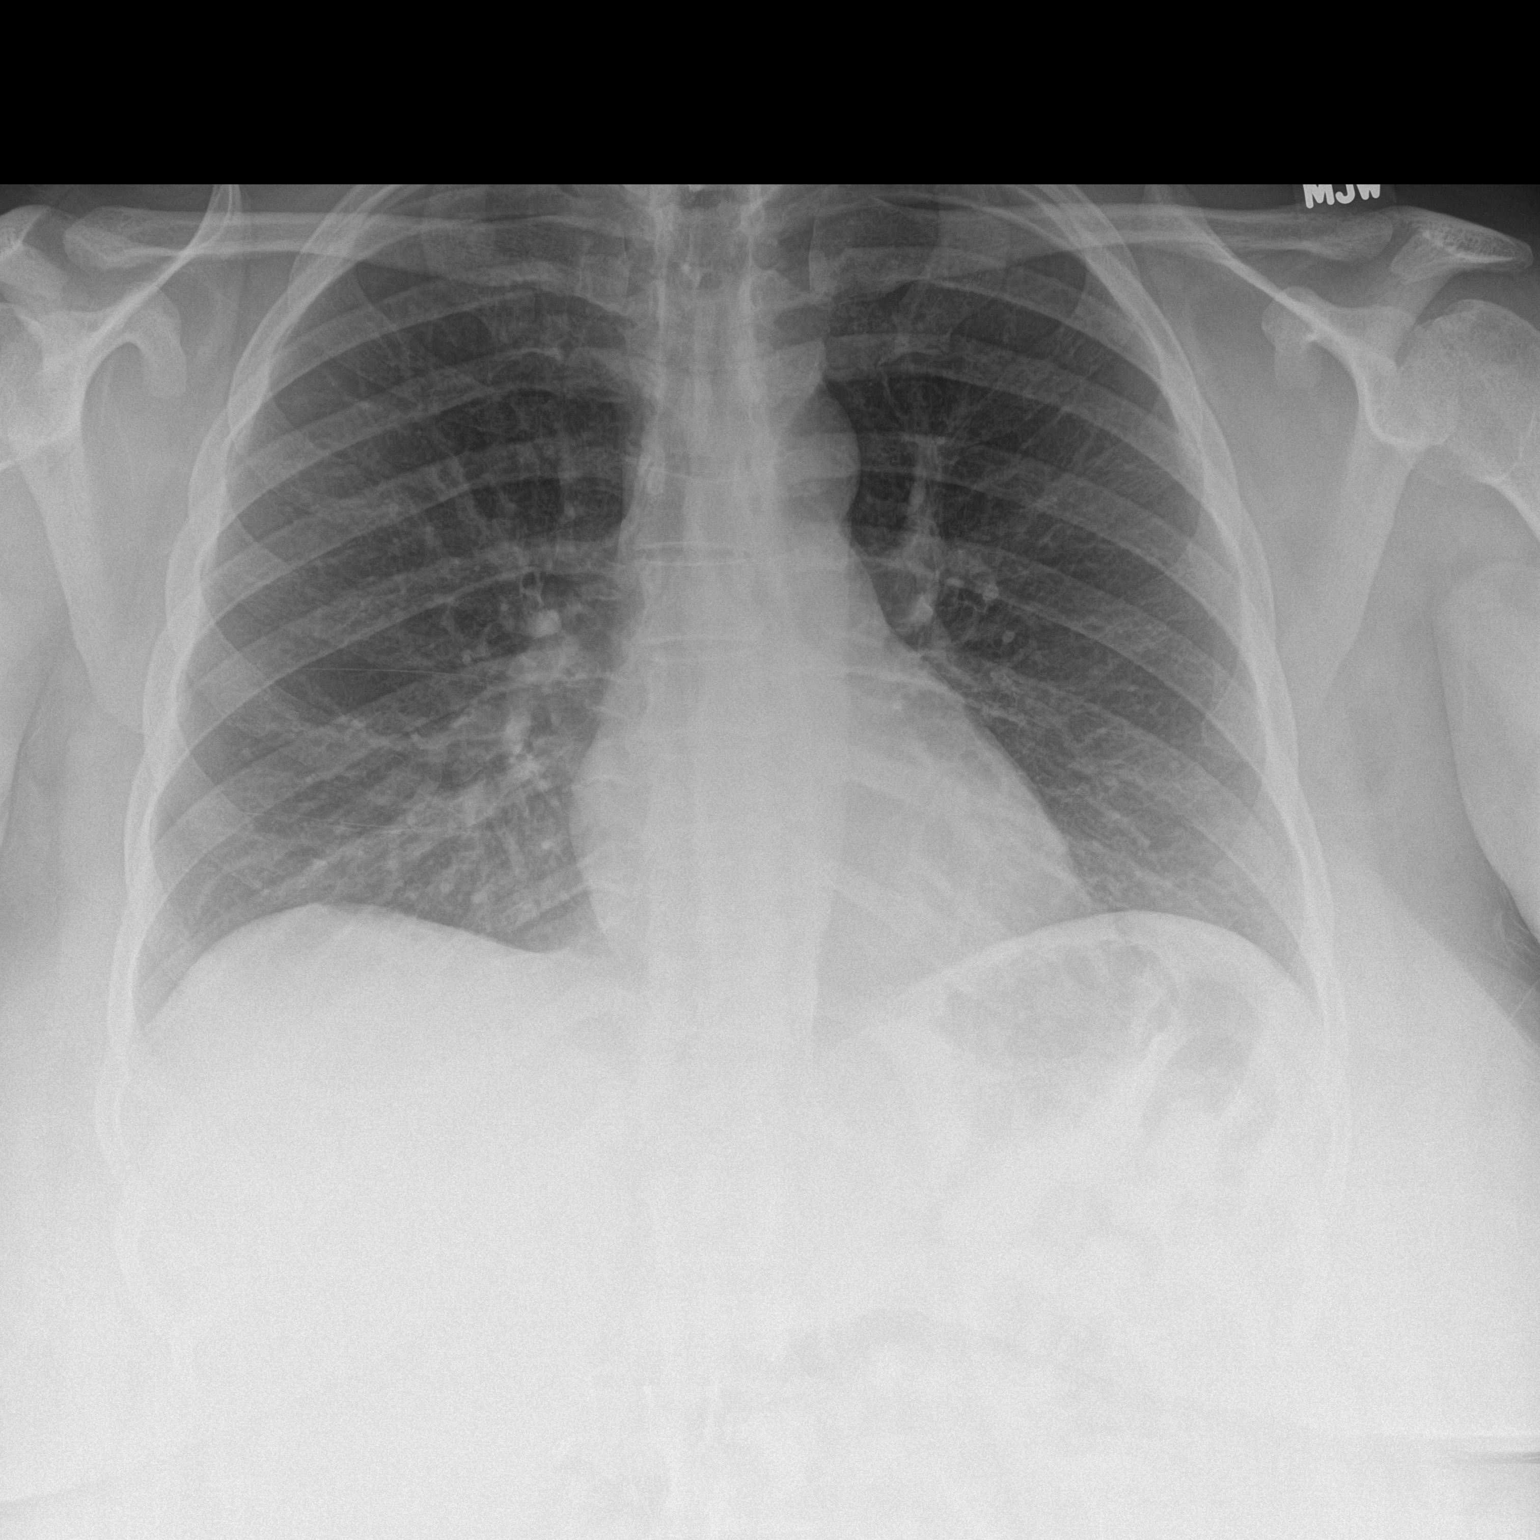

[chest lat]
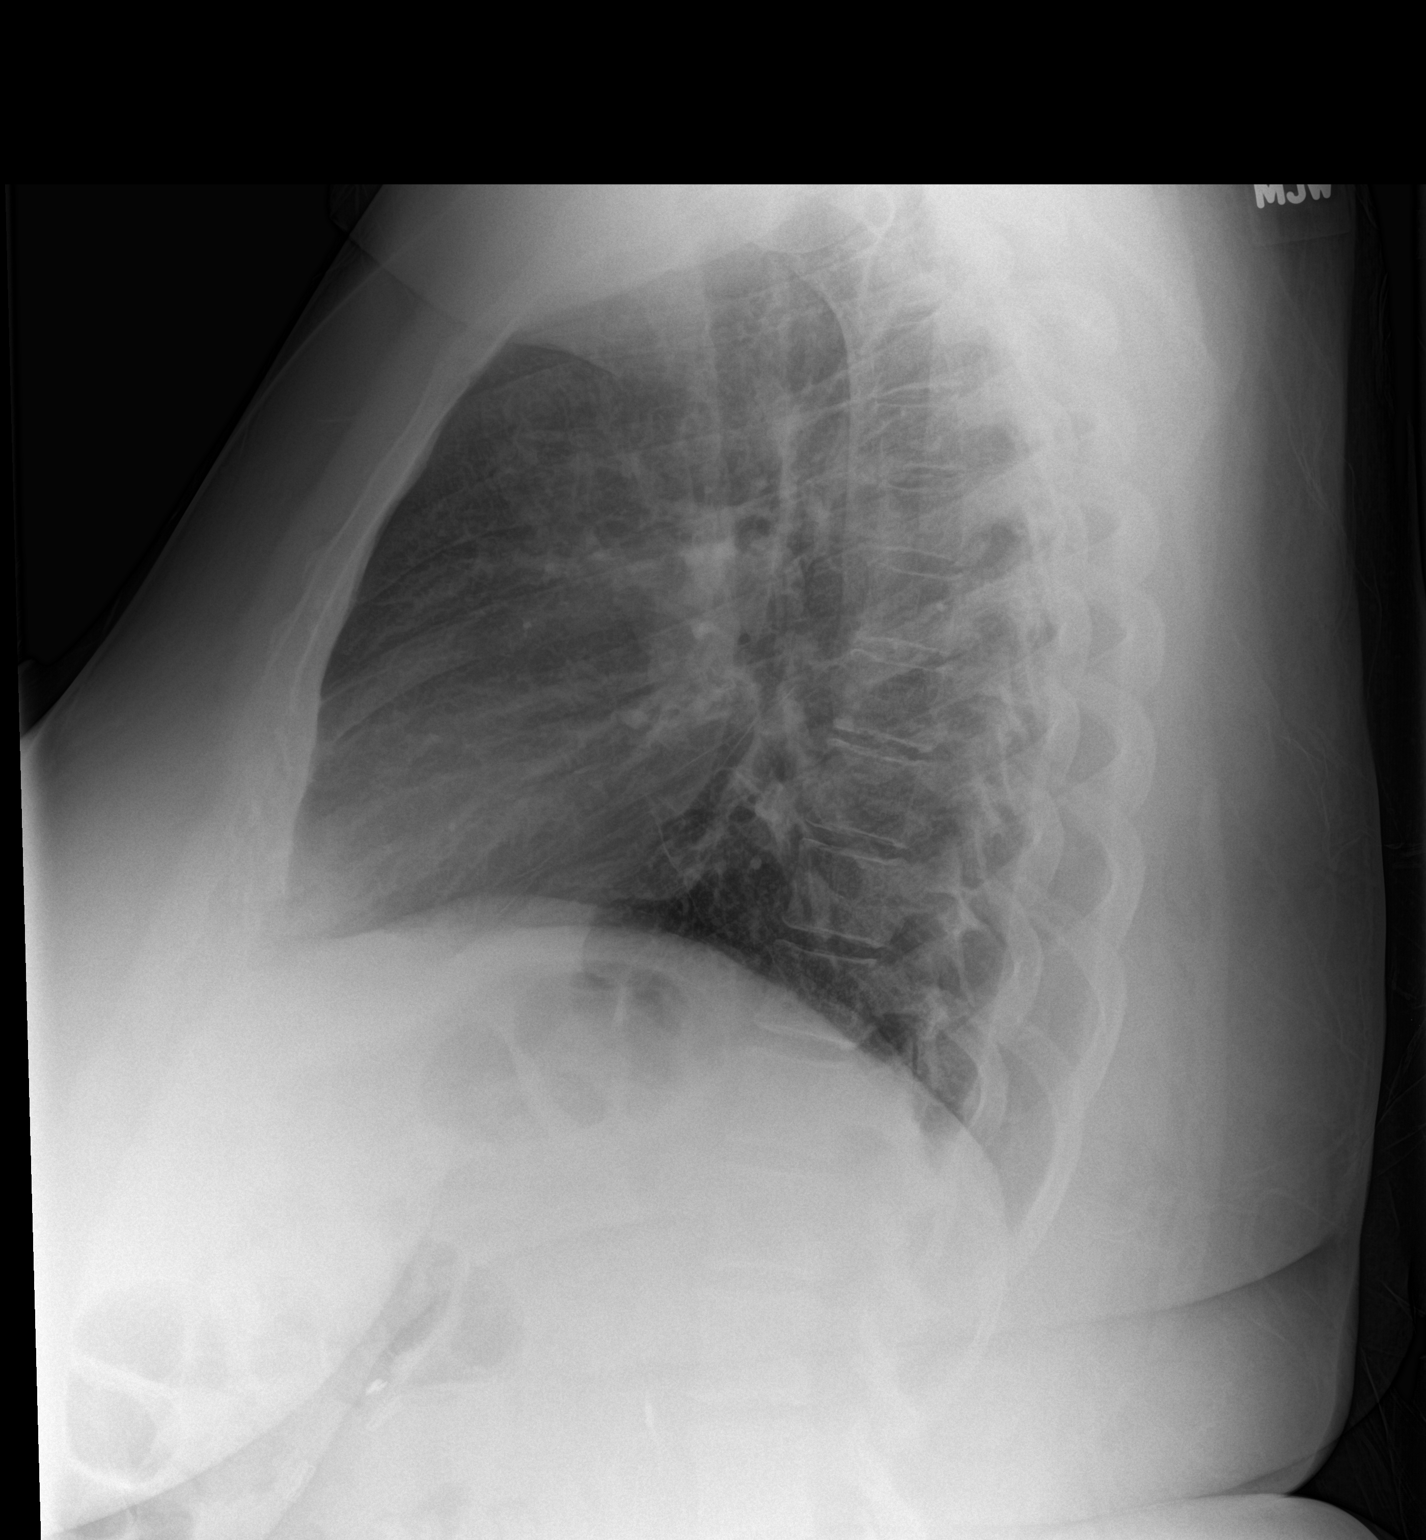

[2 of 2 positions shown; findings below may reference images not displayed]

FINDINGS: The heart size and mediastinal contours are within normal limits.
Both lungs are clear. The visualized skeletal structures are
unremarkable.
IMPRESSION: No active cardiopulmonary disease.

## 2017-01-20 MED FILL — ACCU-CHEK AVIVA PLUS TEST S: 25 days supply | Qty: 100 | Fill #2

## 2017-01-20 MED FILL — ACCU-CHEK SOFTCLIX LANCETS: 25 days supply | Qty: 100 | Fill #1

## 2017-02-11 MED FILL — ACCU-CHEK AVIVA PLUS TEST S: 25 days supply | Qty: 100 | Fill #3

## 2017-02-11 MED FILL — ACCU-CHEK SOFTCLIX LANCETS: 25 days supply | Qty: 100 | Fill #2

## 2017-03-17 MED FILL — ACCU-CHEK SOFTCLIX LANCETS: 25 days supply | Qty: 100 | Fill #3

## 2017-03-17 MED FILL — ACCU-CHEK AVIVA PLUS TEST S: 25 days supply | Qty: 100 | Fill #4

## 2017-04-21 MED FILL — ACCU-CHEK AVIVA PLUS TEST S: 25 days supply | Qty: 100 | Fill #5

## 2017-05-12 ENCOUNTER — Emergency Department (HOSPITAL_COMMUNITY): Payer: Medicaid Other

## 2017-05-12 ENCOUNTER — Emergency Department (HOSPITAL_COMMUNITY)
Admission: EM | Admit: 2017-05-12 | Discharge: 2017-05-12 | Disposition: A | Discharge status: Home / Self Care | Payer: Medicaid Other | Attending: Emergency Medicine | Admitting: Emergency Medicine

## 2017-05-12 ENCOUNTER — Encounter (HOSPITAL_COMMUNITY): Payer: Self-pay | Admitting: Emergency Medicine

## 2017-05-12 DIAGNOSIS — Z5321 Procedure and treatment not carried out due to patient leaving prior to being seen by health care provider: Secondary | ICD-10-CM | POA: Diagnosis not present

## 2017-05-12 DIAGNOSIS — R51 Headache: Secondary | ICD-10-CM | POA: Insufficient documentation

## 2017-05-12 DIAGNOSIS — R079 Chest pain, unspecified: Secondary | ICD-10-CM | POA: Insufficient documentation

## 2017-05-12 LAB — BASIC METABOLIC PANEL
ANION GAP: 11 (ref 5–15)
BUN: 18 mg/dL (ref 6–20)
CALCIUM: 8.8 mg/dL — AB (ref 8.9–10.3)
CO2: 19 mmol/L — ABNORMAL LOW (ref 22–32)
Chloride: 106 mmol/L (ref 101–111)
Creatinine, Ser: 0.87 mg/dL (ref 0.44–1.00)
GLUCOSE: 107 mg/dL — AB (ref 65–99)
Potassium: 3.9 mmol/L (ref 3.5–5.1)
SODIUM: 136 mmol/L (ref 135–145)

## 2017-05-12 LAB — CBC
HEMATOCRIT: 38 % (ref 36.0–46.0)
HEMOGLOBIN: 12.7 g/dL (ref 12.0–15.0)
MCH: 30.6 pg (ref 26.0–34.0)
MCHC: 33.4 g/dL (ref 30.0–36.0)
MCV: 91.6 fL (ref 78.0–100.0)
Platelets: 225 10*3/uL (ref 150–400)
RBC: 4.15 MIL/uL (ref 3.87–5.11)
RDW: 14.5 % (ref 11.5–15.5)
WBC: 6.5 10*3/uL (ref 4.0–10.5)

## 2017-05-12 LAB — I-STAT BETA HCG BLOOD, ED (MC, WL, AP ONLY)

## 2017-05-12 LAB — I-STAT TROPONIN, ED: TROPONIN I, POC: 0 ng/mL (ref 0.00–0.08)

## 2017-05-12 NOTE — ED Notes (Signed)
PT approached the nurse first desk requesting ice and water. I checked with nurse to verify that the PT was not allowed to have anything until seen by the provider. She proceeded to yell  "I dont care what the asian want to be doctor said, you all need to give me what I want". I reassured the PT that we were not allowed to give anything until approved by the provider.

## 2017-05-12 NOTE — ED Notes (Signed)
*  late entry. Around 0215 PT approached the nurse first desk requesting ice and water. I checked with nurse to verify that the PT was not allowed to have anything until seen by the provider. She proceeded to yell  "I dont f^^^ing care what the F^^^ing asian want to be doctor said, you all need to give me what I F^^^ing want". I reassured the PT that we were not allowed to give anything until approved by the provider.

## 2017-05-12 NOTE — ED Notes (Signed)
Pt came to nurse first questioning wait for treatment room.  Pt began cussing at nurse and registration.  Pt states, "I should have went to another f--king hospital because y'all don't know what the f--k you are doing."  RN attempted to explain triage process to her and she said "my labs could be f--king abnormal and I could die while I am waiting for the doctor to give me the results".  RN attempted to explain that we look at her labs as they come back.  Pt says, "You are only the f--king nurse and secretary and you don't know sh-t."  Informed pt that she is not allowed to talk like that in waiting room and she states, "I'm a f--king grown a$$ adult and I will talk however the f--k I want to."  Off-duty [employer] officer to waiting area to speak to pt and she continues to curse at [facility] and states she is grown and can talk how she wants to.  [employer] informed pt that she will need to calm down or will have to leave.  Once [employer] turned to come back to nurses desk pt held her phone up and began taking pictures of nurse first staff.  [employer] went back over to talk to pt and she said she was on facebook.  Pt was escorted out by [employer] due to the continued verbal abuse.

## 2017-05-12 NOTE — ED Triage Notes (Signed)
Reports migraine and chest pain for the last four days.  Hx of migraines.  Taking aleve at home with no relief.

## 2018-03-19 ENCOUNTER — Telehealth: Payer: Self-pay

## 2018-03-19 NOTE — Telephone Encounter (Signed)
Patient is on overdue pap list and needs to schedule an appointment with OBGYN or Lori Madeira, NP if she has not had pap smear done this year. Patient has also fallen out of care, and needs to schedule an appointment to return to our office.  Called patient to see if she has established care at another office or if she would like to return to our office. Patient was able to take my call at this time. States she had moved to [place], but has returned to [facility]. Would like to return to care with our office since she has not seen anyone regarding her HIV since 2016. Patient has scheduled new b20 appointments with our office. Is aware of appointment date and time. Stressed to patient that it is important she keep her appointments and to call us if she needs to reschedule. Patient information updated in chart. Emailed step to sign up to mychart to patients email.  [place]

## 2018-03-30 ENCOUNTER — Ambulatory Visit: Payer: Self-pay

## 2018-03-30 ENCOUNTER — Other Ambulatory Visit: Payer: Self-pay

## 2018-03-30 ENCOUNTER — Other Ambulatory Visit: Payer: Self-pay | Admitting: Behavioral Health

## 2018-03-30 DIAGNOSIS — Z113 Encounter for screening for infections with a predominantly sexual mode of transmission: Secondary | ICD-10-CM

## 2018-03-30 DIAGNOSIS — B20 Human immunodeficiency virus [HIV] disease: Secondary | ICD-10-CM

## 2018-03-30 DIAGNOSIS — Z79899 Other long term (current) drug therapy: Secondary | ICD-10-CM

## 2018-03-30 NOTE — Addendum Note (Signed)
Addended by: Alberteen Sam on: 03/30/2018 10:15 AM   Modules accepted: Orders

## 2018-03-31 LAB — T-HELPER CELL (CD4) - (RCID CLINIC ONLY)
CD4 % Helper T Cell: 25 % — ABNORMAL LOW (ref 33–55)
CD4 T CELL ABS: 630 /uL (ref 400–2700)

## 2018-04-01 ENCOUNTER — Encounter: Payer: Self-pay | Admitting: Family

## 2018-04-11 LAB — CBC WITH DIFFERENTIAL/PLATELET
Absolute Monocytes: 331 cells/uL (ref 200–950)
BASOS PCT: 0.8 %
Basophils Absolute: 38 cells/uL (ref 0–200)
EOS ABS: 29 {cells}/uL (ref 15–500)
Eosinophils Relative: 0.6 %
HCT: 40.1 % (ref 35.0–45.0)
HEMOGLOBIN: 13.8 g/dL (ref 11.7–15.5)
LYMPHS ABS: 2558 {cells}/uL (ref 850–3900)
MCH: 30.9 pg (ref 27.0–33.0)
MCHC: 34.4 g/dL (ref 32.0–36.0)
MCV: 89.9 fL (ref 80.0–100.0)
MPV: 11.6 fL (ref 7.5–12.5)
Monocytes Relative: 6.9 %
NEUTROS ABS: 1843 {cells}/uL (ref 1500–7800)
Neutrophils Relative %: 38.4 %
Platelets: 294 10*3/uL (ref 140–400)
RBC: 4.46 10*6/uL (ref 3.80–5.10)
RDW: 13.2 % (ref 11.0–15.0)
Total Lymphocyte: 53.3 %
WBC: 4.8 10*3/uL (ref 3.8–10.8)

## 2018-04-11 LAB — HIV-1 RNA ULTRAQUANT REFLEX TO GENTYP+
HIV 1 RNA Quant: 9890 copies/mL — ABNORMAL HIGH
HIV-1 RNA Quant, Log: 4 Log copies/mL — ABNORMAL HIGH

## 2018-04-11 LAB — COMPREHENSIVE METABOLIC PANEL
AG Ratio: 1.3 (calc) (ref 1.0–2.5)
ALBUMIN MSPROF: 4.3 g/dL (ref 3.6–5.1)
ALT: 11 U/L (ref 6–29)
AST: 13 U/L (ref 10–30)
Alkaline phosphatase (APISO): 62 U/L (ref 33–115)
BUN: 11 mg/dL (ref 7–25)
CO2: 25 mmol/L (ref 20–32)
CREATININE: 0.9 mg/dL (ref 0.50–1.10)
Calcium: 9.8 mg/dL (ref 8.6–10.2)
Chloride: 102 mmol/L (ref 98–110)
Globulin: 3.3 g/dL (calc) (ref 1.9–3.7)
Glucose, Bld: 108 mg/dL — ABNORMAL HIGH (ref 65–99)
POTASSIUM: 4.5 mmol/L (ref 3.5–5.3)
Sodium: 136 mmol/L (ref 135–146)
Total Bilirubin: 0.4 mg/dL (ref 0.2–1.2)
Total Protein: 7.6 g/dL (ref 6.1–8.1)

## 2018-04-11 LAB — HIV-1/2 AB - DIFFERENTIATION
HIV-1 antibody: POSITIVE — AB
HIV-2 Ab: NEGATIVE

## 2018-04-11 LAB — HIV ANTIBODY (ROUTINE TESTING W REFLEX): HIV 1&2 Ab, 4th Generation: REACTIVE — AB

## 2018-04-11 LAB — LIPID PANEL
CHOL/HDL RATIO: 4 (calc) (ref <5.0)
Cholesterol: 175 mg/dL (ref <200)
HDL: 44 mg/dL — AB (ref >50)
LDL CHOLESTEROL (CALC): 111 mg/dL — AB
NON-HDL CHOLESTEROL (CALC): 131 mg/dL — AB (ref <130)
Triglycerides: 102 mg/dL (ref <150)

## 2018-04-11 LAB — HIV-1 GENOTYPE: HIV-1 GENOTYPE: DETECTED — AB

## 2018-04-11 LAB — RPR: RPR: NONREACTIVE

## 2018-04-14 ENCOUNTER — Emergency Department (HOSPITAL_COMMUNITY)
Admission: EM | Admit: 2018-04-14 | Discharge: 2018-04-14 | Disposition: A | Discharge status: Home / Self Care | Payer: Medicaid - Out of State | Attending: Emergency Medicine | Admitting: Emergency Medicine

## 2018-04-14 ENCOUNTER — Encounter (HOSPITAL_COMMUNITY): Payer: Self-pay | Admitting: Emergency Medicine

## 2018-04-14 ENCOUNTER — Other Ambulatory Visit: Payer: Self-pay

## 2018-04-14 DIAGNOSIS — J45909 Unspecified asthma, uncomplicated: Secondary | ICD-10-CM | POA: Diagnosis not present

## 2018-04-14 DIAGNOSIS — J019 Acute sinusitis, unspecified: Secondary | ICD-10-CM | POA: Insufficient documentation

## 2018-04-14 DIAGNOSIS — Z09 Encounter for follow-up examination after completed treatment for conditions other than malignant neoplasm: Secondary | ICD-10-CM | POA: Insufficient documentation

## 2018-04-14 DIAGNOSIS — R52 Pain, unspecified: Secondary | ICD-10-CM

## 2018-04-14 DIAGNOSIS — F17210 Nicotine dependence, cigarettes, uncomplicated: Secondary | ICD-10-CM | POA: Diagnosis not present

## 2018-04-14 DIAGNOSIS — J329 Chronic sinusitis, unspecified: Secondary | ICD-10-CM

## 2018-04-14 DIAGNOSIS — Z79899 Other long term (current) drug therapy: Secondary | ICD-10-CM | POA: Insufficient documentation

## 2018-04-14 DIAGNOSIS — Z91040 Latex allergy status: Secondary | ICD-10-CM | POA: Insufficient documentation

## 2018-04-14 DIAGNOSIS — R531 Weakness: Secondary | ICD-10-CM | POA: Diagnosis present

## 2018-04-14 DIAGNOSIS — Z794 Long term (current) use of insulin: Secondary | ICD-10-CM | POA: Insufficient documentation

## 2018-04-14 DIAGNOSIS — M79605 Pain in left leg: Secondary | ICD-10-CM | POA: Insufficient documentation

## 2018-04-14 DIAGNOSIS — I10 Essential (primary) hypertension: Secondary | ICD-10-CM | POA: Diagnosis not present

## 2018-04-14 MED ORDER — CIPROFLOXACIN HCL 500 MG PO TABS: 500.0000 mg | ORAL_TABLET | Freq: Two times a day (BID) | ORAL | 0 refills | Status: AC

## 2018-04-14 MED ORDER — HYDROCHLOROTHIAZIDE 25 MG PO TABS: 25.0000 mg | ORAL_TABLET | Freq: Every day | ORAL | 0 refills | Status: DC

## 2018-04-14 MED ORDER — PHENYTOIN SODIUM EXTENDED 100 MG PO CAPS: 100.0000 mg | ORAL_CAPSULE | Freq: Two times a day (BID) | ORAL | 0 refills | Status: DC

## 2018-04-14 MED ORDER — GLUCOSE BLOOD VI STRP: ORAL_STRIP | 12 refills | Status: DC

## 2018-04-14 MED ORDER — CIPROFLOXACIN HCL 500 MG PO TABS: 500.0000 mg | ORAL_TABLET | Freq: Two times a day (BID) | ORAL | 0 refills | Status: DC

## 2018-04-14 MED ORDER — LEVETIRACETAM 500 MG PO TABS: 500.0000 mg | ORAL_TABLET | Freq: Two times a day (BID) | ORAL | 0 refills | Status: DC

## 2018-04-14 MED ORDER — PROPRANOLOL HCL 10 MG PO TABS: 10.0000 mg | ORAL_TABLET | Freq: Two times a day (BID) | ORAL | 0 refills | Status: DC

## 2018-04-14 NOTE — ED Provider Notes (Cosign Needed)
42 year old female was in the hallway, she is pacing from the room to the hallway wandering.  She is cussing at nursing staff using profanity.  Patient is not suitable for remaining here in the emergency room at this time, she was escorted off the property.   Okey Regal, PA-C 04/14/18 1059

## 2018-04-14 NOTE — ED Provider Notes (Cosign Needed Addendum)
[facility] EMERGENCY DEPARTMENT Provider Note   CSN: [id number] Arrival date & time: 04/14/18  8119     History   Chief Complaint Chief Complaint  Patient presents with  . Generalized Body Aches    HPI Lori Ford is a 42 y.o. female.  HPI   Pt is a 42 y/o female with a h/o cervical cancer, fibromyalgia, HIV, chronic pain, who presents to the ED today c/o generalized body aches that began 2 days ago secondary to her chronic pain. States the pain has been making her weak and she has falling 2 times in the last few days. Yesterday she hit the back of her head on the wall. She did not pass out. She is not on blood thinners.   Patient reports rhinorrhea, congestion that has been ongoing for the last 2 weeks. Had taken azithro for presumed dental infection a few weeks ago. Denies sore throat, cough, abd pain, NVD, urinary sxs. She reports sweats, chills.  She reports she has a h/ol dvt and was taken off blood thinners because of internal bleeding. Over the last several months she has had lle pain and swelling for several months.   Pt states that she moved to [place] yesterday from [place] and is trying to get established with a pcp so that she can fill her meds. States she currently needs a refill on Keppra 500mg  BID, dilantin 200mg  tid, accuview test strips, xanax 1mg  once daily, Adderol, and 10mg  oxycodone q3-4 hours, and hctz 25mg , propranolol 10mg  bid.   She states she made an appointment with ID. She has an appt with ID on 04/17/17.   Past Medical History:  Diagnosis Date  . Allergic rhinitis   . Arthritis   . Asthma    as child  . Chronic back pain   . Cigarette nicotine dependence with withdrawal   . DVT (deep venous thrombosis) ([place])   . Gallstones    s/p cholecystectomy  . GERD (gastroesophageal reflux disease)   . HIV (human immunodeficiency virus infection) ([place])   . HTN (hypertension)   . Meningioma ([place])   . Sciatic pain   . Seizure disorder,  grand mal ([place])    dx 2005  . Seizures ([place])    due to head trauma as adult    Patient Active Problem List   Diagnosis Date Noted  . 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 Chest pain 12/10/2014  . Chest wall pain 12/10/2014  . Left leg pain 12/10/2014  . Left knee pain 12/10/2014  . Nausea vomiting and diarrhea 12/10/2014  . Seizures ([place]) 12/10/2014  . Seizure ([place]) 12/10/2014  . Hyperkalemia 12/10/2014  . HIV disease ([place]) 05/11/2014  . Meningioma ([place]) 05/11/2014  . Major depression, recurrent, chronic ([place]) 05/11/2014  . Morbid obesity ([place]) 08/10/2013  . DVT, popliteal, acute, left 10/06/2012  . 50mm Pulmonary nodule, right upper lobe.  repeat CT chest May 2015. 10/06/2012  . Asthma 10/06/2012  . Hypertension 10/06/2012  . GERD (gastroesophageal reflux disease) 10/06/2012  . Cigarette nicotine dependence with withdrawal 10/06/2012  . Seizure disorder, grand mal ([place]) 09/25/2012  . Brain tumor ([place]) 09/25/2012    Past Surgical History:  Procedure Laterality Date  . BRAIN SURGERY     2013 to remove a meningioma  . CHOLECYSTECTOMY    . IVC FILTER REMOVAL N/A 10/31/2015   Procedure: IVC Filter Removal;  Surgeon: Adrian Prows, MD;  Location: [facility];  Service: Cardiovascular;  Laterality: N/A;  . PERIPHERAL  VASCULAR CATHETERIZATION  10/31/2015   Procedure: IVC/SVC Venography;  Surgeon: Adrian Prows, MD;  Location: [facility];  Service: Cardiovascular;;     OB History    Gravida  3   Para  1   Term  1   Preterm      AB  2   Living  1     SAB      TAB  2   Ectopic      Multiple      Live Births               Home Medications    Prior to Admission medications   Medication Sig Start Date End Date Taking? Authorizing Provider  albuterol (PROVENTIL HFA;VENTOLIN HFA) 108 (90 BASE) MCG/ACT inhaler Inhale 2 puffs into the lungs every 4 (four) hours as needed for wheezing or shortness of breath. 08/02/14   Britt Bottom, NP  albuterol  (PROVENTIL) (2.5 MG/3ML) 0.083% nebulizer solution Take 2.5 mg by nebulization every 6 (six) hours as needed for wheezing or shortness of breath.    [provider]  amphetamine-dextroamphetamine (ADDERALL XR) 30 MG 24 hr capsule Take 30 mg by mouth 2 (two) times daily.    [provider]  beclomethasone (QVAR) 40 MCG/ACT inhaler Inhale 2 puffs into the lungs 2 (two) times daily as needed (for shortness of breath).     [provider]  ciprofloxacin (CIPRO) 500 MG tablet Take 1 tablet (500 mg total) by mouth every 12 (twelve) hours for 7 days. 04/14/18 04/21/18  Matilynn Dacey S, PA-C  cyclobenzaprine (FLEXERIL) 10 MG tablet Take 1 tablet (10 mg total) by mouth 2 (two) times daily as needed for muscle spasms. 02/01/13   Larence Penning, MD  divalproex (DEPAKOTE ER) 500 MG 24 hr tablet TAKE 6 TABLETS (3,000 MG TOTAL) BY MOUTH DAILY. 08/19/16   Pieter Partridge, DO  folic acid (FOLVITE) 1 MG tablet Take 1 mg by mouth daily. Reported on 09/06/2015    [provider]  gabapentin (NEURONTIN) 400 MG capsule Take 800 mg by mouth 3 (three) times daily.     [provider]  glucose blood test strip Use as instructed 06/17/16   Robyn Haber, MD  hydrochlorothiazide (HYDRODIURIL) 25 MG tablet Take 1 tablet (25 mg total) by mouth daily. 07/09/13   Harvie Heck, PA-C  insulin glargine (LANTUS) 100 UNIT/ML injection Inject 10 Units into the skin daily.    [provider]  levETIRAcetam (KEPPRA) 500 MG tablet Take 2 tablets (1,000 mg total) by mouth 2 (two) times daily. 12/30/14   Pisciotta, Elmyra Ricks, PA-C  loratadine (CLARITIN) 10 MG tablet Take 10 mg by mouth daily.  03/03/14   [provider]  Mefenamic Acid 250 MG CAPS Take 2 capsules (500 mg total) by mouth 3 (three) times daily. X 4 days 08/30/14   Street, [place], PA-C  meloxicam (MOBIC) 15 MG tablet Take 1 tablet (15 mg total) by mouth daily. 12/07/14   Delos Haring, PA-C  mirtazapine (REMERON) 15 MG  tablet Take 15 mg by mouth at bedtime. 07/04/16   [provider]  omeprazole (PRILOSEC) 40 MG capsule Take 1 capsule (40 mg total) by mouth 2 (two) times daily. 10/12/16 11/14/16  Barnet Glasgow, NP  oxyCODONE-acetaminophen (PERCOCET) 10-325 MG tablet Take 1 tablet by mouth every 4 (four) hours as needed for pain. 10/31/15   Adrian Prows, MD  PARoxetine (PAXIL) 40 MG tablet Take 1 tablet (40 mg total) by mouth daily. 07/09/13  Harvie Heck, PA-C  phenytoin (DILANTIN) 200 MG ER capsule Take 200 mg by mouth 3 (three) times daily.    [provider]  promethazine-dextromethorphan (PROMETHAZINE-DM) 6.25-15 MG/5ML syrup Take 5 mLs by mouth 4 (four) times daily as needed for cough. 11/14/16   Vanessa Kick, MD  propranolol (INDERAL) 10 MG tablet Take 10 mg by mouth 2 (two) times daily.  01/28/14   [provider]  ranitidine (ZANTAC) 300 MG tablet Take 1 tablet (300 mg total) by mouth at bedtime. 10/12/16   Barnet Glasgow, NP  sitaGLIPtin (JANUVIA) 50 MG tablet Take 1 tablet (50 mg total) by mouth daily. 06/17/16   Robyn Haber, MD  sodium chloride (OCEAN) 0.65 % nasal spray Place 1 spray into the nose 2 (two) times daily as needed for congestion.     [provider]  triamcinolone ointment (KENALOG) 0.5 % Apply 1 application topically 2 (two) times daily.    [provider]  Vitamin D, Ergocalciferol, (DRISDOL) 50000 units CAPS capsule Take 50,000 Units by mouth every 7 (seven) days. [facility] 07/04/16   [provider]    Family History Family History  Problem Relation Age of Onset  . Other Mother        varicose vein  . Asthma Mother   . High blood pressure Mother   . Cancer Father   . Cancer Other   . Diabetes Other   . High blood pressure Other   . Asthma Other   . Thyroid disease Other   . Deep vein thrombosis Neg Hx   . Pulmonary embolism Neg Hx     Social History Social History   Tobacco Use  . Smoking status: Current Every  Day Smoker    Packs/day: 0.25    Years: 18.00    Pack years: 4.50    Types: Cigarettes  . Smokeless tobacco: Never Used  Substance Use Topics  . Alcohol use: No    Alcohol/week: 0.0 standard drinks  . Drug use: No    Types: Marijuana    Comment: marijuana/ occasionally; denies on this visit     Allergies   Amoxicillin; Ibuprofen; Penicillins; Zofran [ondansetron]; Clindamycin/lincomycin; Coconut flavor; Doxycycline hyclate; Flagyl [metronidazole]; Latex; Other; and Aspirin   Review of Systems Review of Systems  Constitutional: Positive for chills and fever.  HENT: Positive for congestion and rhinorrhea.   Eyes: Negative for visual disturbance.  Respiratory: Negative for cough and shortness of breath.   Cardiovascular: Negative for chest pain.  Gastrointestinal: Negative for abdominal pain, constipation, diarrhea, nausea and vomiting.  Genitourinary: Negative for dysuria.  Musculoskeletal: Positive for myalgias.       LLE pain/ swelling  Skin: Positive for color change.  Neurological: Negative for headaches.    Physical Exam Updated Vital Signs BP (!) 170/100 (BP Location: Right Arm)   Pulse 94   Temp 99.3 F (37.4 C) (Oral)   Resp 18   LMP 04/09/2018 (Exact Date)   SpO2 100%   Physical Exam Vitals signs and nursing note reviewed.  Constitutional:      General: She is not in acute distress.    Appearance: She is well-developed.  HENT:     Head: Normocephalic and atraumatic.     Right Ear: Tympanic membrane normal.     Left Ear: Tympanic membrane normal.     Nose: Congestion present.     Comments: No frontal or maxillary sinus tenderness    Mouth/Throat:     Mouth: Mucous membranes are moist.  Pharynx: No oropharyngeal exudate or posterior oropharyngeal erythema.  Eyes:     Extraocular Movements: Extraocular movements intact.     Conjunctiva/sclera: Conjunctivae normal.     Pupils: Pupils are equal, round, and reactive to light.  Neck:      Musculoskeletal: Neck supple.  Cardiovascular:     Rate and Rhythm: Normal rate and regular rhythm.     Heart sounds: Normal heart sounds. No murmur.  Pulmonary:     Effort: Pulmonary effort is normal. No respiratory distress.     Breath sounds: Normal breath sounds. No stridor. No wheezing or rhonchi.  Abdominal:     General: Bowel sounds are normal.     Palpations: Abdomen is soft.     Tenderness: There is no abdominal tenderness.  Skin:    General: Skin is warm and dry.  Neurological:     Mental Status: She is alert and oriented to person, place, and time.     Cranial Nerves: No cranial nerve deficit.     Comments: Mental Status:  Alert, thought content appropriate, able to give a coherent history. Speech fluent without evidence of aphasia. Able to follow 2 step commands without difficulty.  Cranial Nerves:  II: pupils equal, round, reactive to light III,IV, VI: ptosis not present, extra-ocular motions intact bilaterally  V,VII: smile symmetric, facial light touch sensation equal VIII: hearing grossly normal to voice  X: uvula elevates symmetrically  XI: bilateral shoulder shrug symmetric and strong XII: midline tongue extension without fassiculations Motor:  Normal tone. 5/5 strength of BUE and BLE major muscle groups including strong and equal grip strength and dorsiflexion/plantar flexion Sensory: light touch normal in all extremities.  Psychiatric:     Comments: Anxious, tearful     ED Treatments / Results  Labs (all labs ordered are listed, but only abnormal results are displayed) Labs Reviewed - No data to display  EKG None  Radiology No results found.  Procedures Procedures (including critical care time)  Medications Ordered in ED Medications - No data to display   Initial Impression / Assessment and Plan / ED Course  I have reviewed the triage vital signs and the nursing notes.  Pertinent labs & imaging results that were available during my care of  the patient were reviewed by me and considered in my medical decision making (see chart for details).      Final Clinical Impressions(s) / ED Diagnoses   Final diagnoses:  Generalized body aches  Sinusitis, unspecified chronicity, unspecified location  Hypertension, unspecified type  Need for case management follow-up   Pt presenting to the ED today with multiple complaints including generalized body aches, sinusitis sxs, and request for medication refill. She has a slight temp, but is not tachycardic.  She has an elevated blood pressure.  Her neuro exam is normal.  I spent suspect that her slight temp may be due to a bacterial sinusitis and I discussed the plan to send her home with antibiotics.  I also discussed the possibility of obtaining a lower extremity ultrasound given her concerns for recurrent DVT.  I discussed the hospital policy about not feeling chronic medications, but did agree to fill her seizure and blood pressure medications.  I discussed that the only medications I would not be able to fill for her were her oxycodone, Adderall, and Xanax, however I would give her an Rx for hydroxyzine for her anxiety.  I discussed the plan to consult case management to help get her plugged in with primary care  more expeditiously.  Following this conversation, the patient became agitated and stated "maybe I am just the wrong color."  She then declined lower extremity ultrasound.  I explained that this is the hospital policy and this is true for all patients.  I discussed patient case with case management who agreed to evaluate the patient.  On reevaluation, patient had left the room and I was told by nursing staff the patient had left AMA.  Patient returned and is now requesting discharge paperwork. As I attempted to discharge patient she began to curse at staff. Pt was advised that cursing at staff will not be tolerated. Pt ambulated out of the ED.  Rx given for abx, htn meds, and seizure meds  ED  Discharge Orders         Ordered    ciprofloxacin (CIPRO) 500 MG tablet  Every 12 hours     04/14/18 [address], Nastasia Kage S, PA-C 04/14/18 1057    Maysin Carstens S, PA-C 04/14/18 1116

## 2018-04-14 NOTE — ED Notes (Signed)
Pt has been provider physical prescriptions handed to her by [facility] officer.

## 2018-04-14 NOTE — Discharge Instructions (Addendum)
Your blood pressure medication was filled.  You were given an antibiotic for your sinus infection.  Your will need to follow up with neurology for the remainder of your seizure medications. You were given a referral to neurology. Their office will call you to make an appointment  Follow up with primary care listed on your discharge paperwork.   Return for new or worsening symptoms.
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
## 2018-04-14 NOTE — ED Notes (Signed)
Pt seen walking out of the room in regular clothing with her belongings in hand. Walking to the exit, this rn asked if she was leaving. She did not respond but proceeded to walk out. EDP notified.

## 2018-04-14 NOTE — ED Notes (Signed)
Pt returning to the room that has been cleaned. She was asked to sit in the recliner. Walking out she states "I am tired of your racist ass not trying to help no body, yall make me sick. 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 When you get a little power you want to run with it."  Meanwhile this RN notified Tivis Ringer that the patient has returned and was asked to sit in the recliner for discharge papers.  Pt with Guest relation services that was not therapeutic, security was called. Pt in front of the nurses station where PA Hedges asked the patient to please leave." you cracker, you don't have to point or walk me out I can walk myself. I want my prescriptions and my paper work." Security with patient standing outside of the door.

## 2018-04-14 NOTE — Discharge Summary (Signed)
EDCM contacted to assist with follow-up appointment.  Pt has [employer] with an assigned PCP (Osei-Bonsu).  EDCM explained to pt that she must contact DSS to have PCP changed in oder to be seen by another MD.  Pt verbralizes understanding.  [facility] made appointment with Domenica Fail, PA-C at [facility] 05/06/18 @ 2:30. Dajaun Goldring J. Clydene Laming, [place], [place], [place]

## 2018-04-17 ENCOUNTER — Encounter: Payer: Self-pay | Admitting: Family

## 2018-04-17 ENCOUNTER — Ambulatory Visit (INDEPENDENT_AMBULATORY_CARE_PROVIDER_SITE_OTHER): Payer: Medicaid Other | Admitting: Pharmacist

## 2018-04-17 ENCOUNTER — Ambulatory Visit (INDEPENDENT_AMBULATORY_CARE_PROVIDER_SITE_OTHER): Payer: Medicaid Other | Admitting: Family

## 2018-04-17 VITALS — BP 115/80 | HR 88 | Temp 99.3°F | Wt 210.0 lb

## 2018-04-17 DIAGNOSIS — Z23 Encounter for immunization: Secondary | ICD-10-CM

## 2018-04-17 DIAGNOSIS — B20 Human immunodeficiency virus [HIV] disease: Secondary | ICD-10-CM | POA: Diagnosis not present

## 2018-04-17 DIAGNOSIS — R569 Unspecified convulsions: Secondary | ICD-10-CM | POA: Diagnosis not present

## 2018-04-17 DIAGNOSIS — Z0000 Encounter for general adult medical examination without abnormal findings: Secondary | ICD-10-CM | POA: Diagnosis not present

## 2018-04-17 DIAGNOSIS — I10 Essential (primary) hypertension: Secondary | ICD-10-CM

## 2018-04-17 MED ORDER — BICTEGRAVIR-EMTRICITAB-TENOFOV 50-200-25 MG PO TABS: 1.0000 | ORAL_TABLET | Freq: Every day | ORAL | 2 refills | Status: DC

## 2018-04-17 NOTE — Assessment & Plan Note (Signed)
Blood pressure well controlled today. Continue to monitor.

## 2018-04-17 NOTE — Patient Instructions (Addendum)
Nice to meet you.  Welcome back to [place].  Please start taking your Biktarvy daily.  For primary care:  [facility] and [facility] [place], [place], [place] [address] [phone]  We will get you placed on our dental clinic list as well as for a Pap smear with Janene Madeira, NP.   Plan for follow up in 1 month or sooner if needed.

## 2018-04-17 NOTE — Progress Notes (Signed)
HPI: Lori Ford is a 43 y.o. female who presents to the [facility] today to see Marya Amsler.  She has not been seen since 2016.  Patient Active Problem List   Diagnosis Date Noted  . Healthcare maintenance 04/17/2018  . 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 Chest pain 12/10/2014  . Chest wall pain 12/10/2014  . Left leg pain 12/10/2014  . Left knee pain 12/10/2014  . Nausea vomiting and diarrhea 12/10/2014  . Seizures ([place]) 12/10/2014  . Seizure ([place]) 12/10/2014  . Hyperkalemia 12/10/2014  . HIV disease ([place]) 05/11/2014  . Meningioma ([place]) 05/11/2014  . Major depression, recurrent, chronic ([place]) 05/11/2014  . Morbid obesity ([place]) 08/10/2013  . DVT, popliteal, acute, left 10/06/2012  . 23mm Pulmonary nodule, right upper lobe.  repeat CT chest May 2015. 10/06/2012  . Asthma 10/06/2012  . Hypertension 10/06/2012  . GERD (gastroesophageal reflux disease) 10/06/2012  . Cigarette nicotine dependence with withdrawal 10/06/2012  . Seizure disorder, grand mal ([place]) 09/25/2012  . Brain tumor ([place]) 09/25/2012    Patient's Medications  New Prescriptions   No medications on file  Previous Medications   ALBUTEROL (PROVENTIL HFA;VENTOLIN HFA) 108 (90 BASE) MCG/ACT INHALER    Inhale 2 puffs into the lungs every 4 (four) hours as needed for wheezing or shortness of breath.   ALBUTEROL (PROVENTIL) (2.5 MG/3ML) 0.083% NEBULIZER SOLUTION    Take 2.5 mg by nebulization every 6 (six) hours as needed for wheezing or shortness of breath.   AMPHETAMINE-DEXTROAMPHETAMINE (ADDERALL XR) 30 MG 24 HR CAPSULE    Take 30 mg by mouth 2 (two) times daily.   BECLOMETHASONE (QVAR) 40 MCG/ACT INHALER    Inhale 2 puffs into the lungs 2 (two) times daily as needed (for shortness of breath).    BICTEGRAVIR-EMTRICITABINE-TENOFOVIR AF (BIKTARVY) 50-200-25 MG TABS TABLET    Take 1 tablet by mouth daily.   CIPROFLOXACIN (CIPRO) 500 MG TABLET    Take 1 tablet (500 mg total) by mouth every 12 (twelve) hours for 7 days.   CYCLOBENZAPRINE (FLEXERIL) 10 MG TABLET    Take 1 tablet (10 mg total) by mouth 2 (two) times daily as needed for muscle spasms.   DIVALPROEX (DEPAKOTE ER) 500 MG 24 HR TABLET    TAKE 6 TABLETS (3,000 MG TOTAL) BY MOUTH DAILY.   FOLIC ACID (FOLVITE) 1 MG TABLET    Take 1 mg by mouth daily. Reported on 09/06/2015   GABAPENTIN (NEURONTIN) 400 MG CAPSULE    Take 800 mg by mouth 3 (three) times daily.    GLUCOSE BLOOD (ACCU-CHEK ACTIVE STRIPS) TEST STRIP    Use as instructed   HYDROCHLOROTHIAZIDE (HYDRODIURIL) 25 MG TABLET    Take 1 tablet (25 mg total) by mouth daily for 7 days.   INSULIN GLARGINE (LANTUS) 100 UNIT/ML INJECTION    Inject 10 Units into the skin daily.   LEVETIRACETAM (KEPPRA) 500 MG TABLET    Take 1 tablet (500 mg total) by mouth 2 (two) times daily for 7 days.   LORATADINE (CLARITIN) 10 MG TABLET    Take 10 mg by mouth daily.    MIRTAZAPINE (REMERON) 15 MG TABLET    Take 15 mg by mouth at bedtime.   OMEPRAZOLE (PRILOSEC) 40 MG CAPSULE    Take 1 capsule (40 mg total) by mouth 2 (two) times daily.   OXYCODONE-ACETAMINOPHEN (PERCOCET) 10-325 MG TABLET    Take 1 tablet by mouth every 4 (four) hours as needed for pain.   PAROXETINE (PAXIL) 40  MG TABLET    Take 1 tablet (40 mg total) by mouth daily.   PROMETHAZINE-DEXTROMETHORPHAN (PROMETHAZINE-DM) 6.25-15 MG/5ML SYRUP    Take 5 mLs by mouth 4 (four) times daily as needed for cough.   PROPRANOLOL (INDERAL) 10 MG TABLET    Take 1 tablet (10 mg total) by mouth 2 (two) times daily for 7 days.   RANITIDINE (ZANTAC) 300 MG TABLET    Take 1 tablet (300 mg total) by mouth at bedtime.   SITAGLIPTIN (JANUVIA) 50 MG TABLET    Take 1 tablet (50 mg total) by mouth daily.   SODIUM CHLORIDE (OCEAN) 0.65 % NASAL SPRAY    Place 1 spray into the nose 2 (two) times daily as needed for congestion.    TRIAMCINOLONE OINTMENT (KENALOG) 0.5 %    Apply 1 application topically 2 (two) times daily.   VITAMIN D, ERGOCALCIFEROL, (DRISDOL) 50000 UNITS CAPS CAPSULE     Take 50,000 Units by mouth every 7 (seven) days. THURSDAYS  Modified Medications   No medications on file  Discontinued Medications   No medications on file    Allergies: Allergies  Allergen Reactions  . Ibuprofen Shortness Of Breath    wheezing  . Penicillins Anaphylaxis  . Zofran [Ondansetron] Nausea And Vomiting    Violent and uncontrolled  . Clindamycin/Lincomycin Hives  . Coconut Flavor Hives  . Doxycycline Hyclate     Shortness of breath  . Flagyl [Metronidazole] Other (See Comments)    "it gave me a real bad bacterial infection"  . Latex Other (See Comments)    Rash, wheezing  . Other Swelling    "GRAPE SUBSTANCE"  . Aspirin Palpitations    wheezing    Past Medical History: Past Medical History:  Diagnosis Date  . Allergic rhinitis   . Arthritis   . Asthma    as child  . Chronic back pain   . Cigarette nicotine dependence with withdrawal   . DVT (deep venous thrombosis) ([place])   . Gallstones    s/p cholecystectomy  . GERD (gastroesophageal reflux disease)   . HIV (human immunodeficiency virus infection) ([place])   . HTN (hypertension)   . Meningioma ([place])   . Sciatic pain   . Seizure disorder, grand mal ([place])    dx 2005  . Seizures ([place])    due to head trauma as adult    Social History: Social History   Socioeconomic History  . Marital status: Single    Spouse name: Not on file  . Number of children: 1  . Years of education: college  . Highest education level: Not on file  Occupational History  . Occupation: Disabled  Social Needs  . Financial resource strain: Not on file  . Food insecurity:    Worry: Not on file    Inability: Not on file  . Transportation needs:    Medical: Not on file    Non-medical: Not on file  Tobacco Use  . Smoking status: Current Every Day Smoker    Packs/day: 0.25    Years: 22.00    Pack years: 5.50    Types: Cigarettes  . Smokeless tobacco: Never Used  Substance and Sexual Activity  . Alcohol use: Yes     Alcohol/week: 0.0 standard drinks    Comment: Socially / Occasional  . Drug use: No    Types: Marijuana    Comment: marijuana/ occasionally; denies on this visit  . Sexual activity: Never    Partners: Male    Birth control/protection:  None  Lifestyle  . Physical activity:    Days per week: Not on file    Minutes per session: Not on file  . Stress: Not on file  Relationships  . Social connections:    Talks on phone: Not on file    Gets together: Not on file    Attends religious service: Not on file    Active member of club or organization: Not on file    Attends meetings of clubs or organizations: Not on file    Relationship status: Not on file  Other Topics Concern  . Not on file  Social History Narrative   Lives with fiance.  Does not have a PCP.  [address] Dr. Jimmye Norman, walk in.  Has insurance.  [facility] [address].      Labs: Lab Results  Component Value Date   HIV1RNAQUANT 9,890 (H) 03/30/2018   HIV1RNAQUANT 1,329 (H) 07/20/2014   HIV1RNAQUANT 1,819 (H) 04/26/2014   CD4TABS 630 03/30/2018   CD4TABS 830 07/20/2014   CD4TABS 690 04/26/2014    RPR and STI Lab Results  Component Value Date   LABRPR NON-REACTIVE 03/30/2018   LABRPR Non Reactive 06/16/2016   LABRPR Non Reactive 08/30/2014   LABRPR NON REAC 07/20/2014   LABRPR NON REAC 04/26/2014    STI Results GC GC CT CT  Latest Ref Rng & Units - NEGATIVE - NEGATIVE  08/30/2014 Negative - Negative -  07/20/2014 Negative - Negative -  04/26/2014 NG: Negative - CT: Negative -  12/12/2013 - NEGATIVE - NEGATIVE  08/02/2013 - NEGATIVE - NEGATIVE  11/07/2012 - NEGATIVE - NEGATIVE    Hepatitis B Lab Results  Component Value Date   HEPBSAB POS (A) 04/26/2014   HEPBSAG NEGATIVE 04/26/2014   HEPBCAB NON REACTIVE 04/26/2014   Hepatitis C No results found for: HEPCAB, HCVRNAPCRQN Hepatitis A Lab Results  Component Value Date   HAV NON REACTIVE 04/26/2014   Lipids: Lab Results  Component Value Date    CHOL 175 03/30/2018   TRIG 102 03/30/2018   HDL 44 (L) 03/30/2018   CHOLHDL 4.0 03/30/2018   VLDL 21 04/26/2014   LDLCALC 111 (H) 03/30/2018    Current HIV Regimen: None - Triumeq previously  Assessment: Lori Ford is here today to re-initiate care for her HIV infection. She was last seen by Dr. Tommy Medal in April of 2016.  She was prescribed Triumeq at that time but had difficulty tolerating with nausea and vomiting.  She has not taken anything in about 3-4 years. She just moved back from [place] and states that she is homeless.  She applied for HMAP, which is approved as of 12/31.  We will switch her to [place] today. Explained that [place] Odor is a one pill once daily medication with or without food and the importance of not missing any doses. Explained resistance and how it develops and why it is so important to take Biktarvy daily and not skip days or doses. Counseled patient to take it around the same time each day. Counseled on what to do if dose is missed, if closer to missed dose take immediately, if closer to next dose then skip and resume normal schedule.   Cautioned on possible side effects the first week or so including nausea, diarrhea, dizziness, and headaches but that they should resolve after the first couple of weeks. I reviewed patient medications and found a drug interaction with phenytoin, but patient states she is no longer taking. There is no drug-drug interaction with  Biktarvy and her depakote or keppra. Counseled patient to separate Biktarvy from divalent cations including multivitamins. Discussed with patient to call clinic if she starts a new medication or herbal supplement. I gave the patient my card and told her to call me with any issues/questions/concerns.  Plan: - Start Biktarvy PO once daily  Lori Ford L. Pragya Lofaso, PharmD, BCIDP, AAHIVP, [facility] 04/17/2018, 10:54 AM

## 2018-04-17 NOTE — Assessment & Plan Note (Signed)
Ms. Niccoli is not currently on medication for HIV disease. We discussed importance of reducing viral load and inflammation to reduce risk of progressive HIV disease as well as increased risk for kidney disease and cardiovascular disease in the future. She has several obstacles that may present challenges as she gets established including homelessness and chronic medical conditions. She was able to meet with our pharmacy staff, counseling staff and she was referred to [facility] today as well. ADAP/HMAP has been approved and will start her on Biktarvy. Plan for office follow up in 1 month or sooner if needed.

## 2018-04-17 NOTE — Assessment & Plan Note (Signed)
Not currently on medications and no seizures recently that she acknowledges. Will likely need referral to establish with neurology. If she is going to be placed on Dilatin will need to adjust HIV medications. Continue to monitor.

## 2018-04-17 NOTE — Progress Notes (Signed)
Subjective:    Patient ID: Lori Ford, female    DOB: Mar 01, 1976, 43 y.o.   MRN: [id number]  Chief Complaint  Patient presents with  . HIV Positive/AIDS    HPI:  Lori Ford is a 43 y.o. female who presents today to re-establish care for HIV disease.   Lori Ford was initially diagnosed with HIV in 2015 who likely was infected by her husband through heterosexual contact. Her initial genotype was positive for K103N and V108I. She was initially started on Triumeq with Dr. Tommy Medal in January of 2016.   She was last seen in the office on 07/20/14 for follow up and had not started taking the Triumeq at the time. Most recent lab work was completed on 03/30/18  With a viral load of 9,890 and CD4 count of 630. Genotype once again found K103N mutation. RPR was non-reactive with normal kidney function, liver function and electrolytes. She was recently evaluated in the ED for body aches due to her chronic pain and had been making her weak. Diagnosed with sinus infection and started on ciprofloxacin.  Lori Ford has recently moved back to [facility] from [place] and has multiple medical problems. She is working to establish care in a primary care setting. The ED has provided her anti-seizure and anti-hypertensive medications.   Describes having started the Triumeq and made her sick feeling nausea, weakness, and inability to keep food down. Currently homeless and does not have any support in the area. Has had chills recently, but no recent fevers, rashes, or lesions. She has been seen by a dentist about 2 weeks ago but also needs to follow up. Due for a Pap smear.    Allergies  Allergen Reactions  . Ibuprofen Shortness Of Breath    wheezing  . Penicillins Anaphylaxis  . Zofran [Ondansetron] Nausea And Vomiting    Violent and uncontrolled  . Clindamycin/Lincomycin Hives  . Coconut Flavor Hives  . Doxycycline Hyclate     Shortness of breath  . Flagyl [Metronidazole] Other (See Comments)   "it gave me a real bad bacterial infection"  . Latex Other (See Comments)    Rash, wheezing  . Other Swelling    "GRAPE SUBSTANCE"  . Aspirin Palpitations    wheezing      Outpatient Medications Prior to Visit  Medication Sig Dispense Refill  . albuterol (PROVENTIL HFA;VENTOLIN HFA) 108 (90 BASE) MCG/ACT inhaler Inhale 2 puffs into the lungs every 4 (four) hours as needed for wheezing or shortness of breath. (Patient not taking: Reported on 04/17/2018) 1 Inhaler 3  . albuterol (PROVENTIL) (2.5 MG/3ML) 0.083% nebulizer solution Take 2.5 mg by nebulization every 6 (six) hours as needed for wheezing or shortness of breath.    . amphetamine-dextroamphetamine (ADDERALL XR) 30 MG 24 hr capsule Take 30 mg by mouth 2 (two) times daily.    . beclomethasone (QVAR) 40 MCG/ACT inhaler Inhale 2 puffs into the lungs 2 (two) times daily as needed (for shortness of breath).     . ciprofloxacin (CIPRO) 500 MG tablet Take 1 tablet (500 mg total) by mouth every 12 (twelve) hours for 7 days. (Patient not taking: Reported on 04/17/2018) 14 tablet 0  . cyclobenzaprine (FLEXERIL) 10 MG tablet Take 1 tablet (10 mg total) by mouth 2 (two) times daily as needed for muscle spasms. (Patient not taking: Reported on 04/17/2018) 2 tablet 0  . divalproex (DEPAKOTE ER) 500 MG 24 hr tablet TAKE 6 TABLETS (3,000 MG TOTAL) BY MOUTH DAILY. (Patient not taking:  Reported on 04/17/2018) 941 tablet 0  . folic acid (FOLVITE) 1 MG tablet Take 1 mg by mouth daily. Reported on 09/06/2015    . gabapentin (NEURONTIN) 400 MG capsule Take 800 mg by mouth 3 (three) times daily.     Marland Kitchen glucose blood (ACCU-CHEK ACTIVE STRIPS) test strip Use as instructed (Patient not taking: Reported on 04/17/2018) 100 each 12  . hydrochlorothiazide (HYDRODIURIL) 25 MG tablet Take 1 tablet (25 mg total) by mouth daily for 7 days. (Patient not taking: Reported on 04/17/2018) 7 tablet 0  . insulin glargine (LANTUS) 100 UNIT/ML injection Inject 10 Units into the skin daily.      Marland Kitchen levETIRAcetam (KEPPRA) 500 MG tablet Take 1 tablet (500 mg total) by mouth 2 (two) times daily for 7 days. (Patient not taking: Reported on 04/17/2018) 14 tablet 0  . loratadine (CLARITIN) 10 MG tablet Take 10 mg by mouth daily.   0  . mirtazapine (REMERON) 15 MG tablet Take 15 mg by mouth at bedtime.  2  . omeprazole (PRILOSEC) 40 MG capsule Take 1 capsule (40 mg total) by mouth 2 (two) times daily. 28 capsule 0  . oxyCODONE-acetaminophen (PERCOCET) 10-325 MG tablet Take 1 tablet by mouth every 4 (four) hours as needed for pain. (Patient not taking: Reported on 04/17/2018) 10 tablet 0  . PARoxetine (PAXIL) 40 MG tablet Take 1 tablet (40 mg total) by mouth daily. (Patient not taking: Reported on 04/17/2018) 30 tablet 0  . promethazine-dextromethorphan (PROMETHAZINE-DM) 6.25-15 MG/5ML syrup Take 5 mLs by mouth 4 (four) times daily as needed for cough. (Patient not taking: Reported on 04/17/2018) 118 mL 0  . propranolol (INDERAL) 10 MG tablet Take 1 tablet (10 mg total) by mouth 2 (two) times daily for 7 days. (Patient not taking: Reported on 04/17/2018) 14 tablet 0  . ranitidine (ZANTAC) 300 MG tablet Take 1 tablet (300 mg total) by mouth at bedtime. (Patient not taking: Reported on 04/17/2018) 30 tablet 2  . sitaGLIPtin (JANUVIA) 50 MG tablet Take 1 tablet (50 mg total) by mouth daily. (Patient not taking: Reported on 04/17/2018) 90 tablet 0  . sodium chloride (OCEAN) 0.65 % nasal spray Place 1 spray into the nose 2 (two) times daily as needed for congestion.     . triamcinolone ointment (KENALOG) 0.5 % Apply 1 application topically 2 (two) times daily.    . Vitamin D, Ergocalciferol, (DRISDOL) 50000 units CAPS capsule Take 50,000 Units by mouth every 7 (seven) days. THURSDAYS  0  . Mefenamic Acid 250 MG CAPS Take 2 capsules (500 mg total) by mouth 3 (three) times daily. X 4 days (Patient not taking: Reported on 04/17/2018) 28 each 0  . meloxicam (MOBIC) 15 MG tablet Take 1 tablet (15 mg total) by mouth daily.  (Patient not taking: Reported on 04/17/2018) 30 tablet 0  . phenytoin (DILANTIN) 100 MG ER capsule Take 1 capsule (100 mg total) by mouth 2 (two) times daily for 7 days. (Patient not taking: Reported on 04/17/2018) 14 capsule 0   No facility-administered medications prior to visit.      Past Medical History:  Diagnosis Date  . Allergic rhinitis   . Arthritis   . Asthma    as child  . Chronic back pain   . Cigarette nicotine dependence with withdrawal   . DVT (deep venous thrombosis) ([place])   . Gallstones    s/p cholecystectomy  . GERD (gastroesophageal reflux disease)   . HIV (human immunodeficiency virus infection) ([place])   .  HTN (hypertension)   . Meningioma ([place])   . Sciatic pain   . Seizure disorder, grand mal ([place])    dx 2005  . Seizures ([place])    due to head trauma as adult      Past Surgical History:  Procedure Laterality Date  . BRAIN SURGERY     2013 to remove a meningioma  . CHOLECYSTECTOMY    . IVC FILTER REMOVAL N/A 10/31/2015   Procedure: IVC Filter Removal;  Surgeon: Adrian Prows, MD;  Location: [place] [facility];  Service: Cardiovascular;  Laterality: N/A;  . PERIPHERAL VASCULAR CATHETERIZATION  10/31/2015   Procedure: IVC/SVC Venography;  Surgeon: Adrian Prows, MD;  Location: [place] [facility];  Service: Cardiovascular;;      Family History  Problem Relation Age of Onset  . Other Mother        varicose vein  . Asthma Mother   . High blood pressure Mother   . Cancer Father   . Cancer Other   . Diabetes Other   . High blood pressure Other   . Asthma Other   . Thyroid disease Other   . Deep vein thrombosis Neg Hx   . Pulmonary embolism Neg Hx       Social History   Socioeconomic History  . Marital status: Single    Spouse name: Not on file  . Number of children: 1  . Years of education: college  . Highest education level: Not on file  Occupational History  . Occupation: Disabled  Social Needs  . Financial resource strain: Not on file  . Food  insecurity:    Worry: Not on file    Inability: Not on file  . Transportation needs:    Medical: Not on file    Non-medical: Not on file  Tobacco Use  . Smoking status: Current Every Day Smoker    Packs/day: 0.25    Years: 22.00    Pack years: 5.50    Types: Cigarettes  . Smokeless tobacco: Never Used  Substance and Sexual Activity  . Alcohol use: Yes    Alcohol/week: 0.0 standard drinks    Comment: Socially / Occasional  . Drug use: No    Types: Marijuana    Comment: marijuana/ occasionally; denies on this visit  . Sexual activity: Never    Partners: Male    Birth control/protection: None  Lifestyle  . Physical activity:    Days per week: Not on file    Minutes per session: Not on file  . Stress: Not on file  Relationships  . Social connections:    Talks on phone: Not on file    Gets together: Not on file    Attends religious service: Not on file    Active member of club or organization: Not on file    Attends meetings of clubs or organizations: Not on file    Relationship status: Not on file  . Intimate partner violence:    Fear of current or ex partner: Not on file    Emotionally abused: Not on file    Physically abused: Not on file    Forced sexual activity: Not on file  Other Topics Concern  . Not on file  Social History Narrative   Lives with fiance.  Does not have a PCP.  [address] Dr. Jimmye Norman, walk in.  Has insurance.  [facility] [address].        Review of Systems  Constitutional: Negative for appetite change, chills,  diaphoresis, fatigue, fever and unexpected weight change.  Eyes:       Negative for acute change in vision  Respiratory: Negative for chest tightness, shortness of breath and wheezing.   Cardiovascular: Negative for chest pain.  Gastrointestinal: Negative for diarrhea, nausea and vomiting.  Genitourinary: Negative for dysuria, pelvic pain and vaginal discharge.  Musculoskeletal: Negative for neck pain and neck stiffness.  Skin:  Negative for rash.  Neurological: Negative for seizures, syncope, weakness and headaches.  Hematological: Negative for adenopathy. Does not bruise/bleed easily.  Psychiatric/Behavioral: Negative for hallucinations.       Objective:    BP 115/80   Pulse 88   Temp 99.3 F (37.4 C) (Oral)   Wt 210 lb (95.3 kg)   LMP 04/11/2018   BMI 38.41 kg/m  Nursing note and vital signs reviewed.  Physical Exam Constitutional:      General: She is not in acute distress.    Appearance: She is well-developed.  Eyes:     Conjunctiva/sclera: Conjunctivae normal.  Neck:     Musculoskeletal: Neck supple.  Cardiovascular:     Rate and Rhythm: Normal rate and regular rhythm.     Heart sounds: Normal heart sounds. No murmur. No friction rub. No gallop.   Pulmonary:     Effort: Pulmonary effort is normal. No respiratory distress.     Breath sounds: Normal breath sounds. No wheezing or rales.  Chest:     Chest wall: No tenderness.  Abdominal:     General: Bowel sounds are normal.     Palpations: Abdomen is soft.     Tenderness: There is no abdominal tenderness.  Lymphadenopathy:     Cervical: No cervical adenopathy.  Skin:    General: Skin is warm and dry.     Findings: No rash.  Neurological:     Mental Status: She is alert and oriented to person, place, and time.  Psychiatric:        Behavior: Behavior normal.        Thought Content: Thought content normal.        Judgment: Judgment normal.         Assessment & Plan:   Problem List Items Addressed This Visit      Cardiovascular and Mediastinum   Hypertension    Blood pressure well controlled today. Continue to monitor.         Other   HIV disease ([place]) - Primary    Lori Ford is not currently on medication for HIV disease. We discussed importance of reducing viral load and inflammation to reduce risk of progressive HIV disease as well as increased risk for kidney disease and cardiovascular disease in the future. She has  several obstacles that may present challenges as she gets established including homelessness and chronic medical conditions. She was able to meet with our pharmacy staff, counseling staff and she was referred to [facility] today as well. ADAP/HMAP has been approved and will start her on Biktarvy. Plan for office follow up in 1 month or sooner if needed.       Relevant Medications   bictegravir-emtricitabine-tenofovir AF (BIKTARVY) 50-200-25 MG TABS tablet   Other Relevant Orders   MENINGOCOCCAL MCV4O (Completed)   Pneumococcal conjugate vaccine 13-valent IM (Completed)   Seizures ([place])    Not currently on medications and no seizures recently that she acknowledges. Will likely need referral to establish with neurology. If she is going to be placed on Dilatin will need to adjust HIV medications. Continue  to monitor.       Healthcare maintenance     Prevnar and 1st Menveo updated today. Will need second Menveo in 8 weeks.   Referred to Janene Madeira, NP for Pap smear/cervical cancer screening  Information provided for [facility] and Wellness to establish with primary care  Referred to [place] [facility] for continued dental care.       Other Visit Diagnoses    Need for meningococcal vaccination       Relevant Orders   MENINGOCOCCAL MCV4O (Completed)   Need for vaccination with 13-polyvalent pneumococcal conjugate vaccine       Relevant Orders   Pneumococcal conjugate vaccine 13-valent IM (Completed)       I have discontinued Lori Ford's Mefenamic Acid, meloxicam, and phenytoin. I am also having her start on bictegravir-emtricitabine-tenofovir AF. Additionally, I am having her maintain her folic acid, sodium chloride, beclomethasone, triamcinolone ointment, cyclobenzaprine, PARoxetine, gabapentin, albuterol, loratadine, albuterol, amphetamine-dextroamphetamine, insulin glargine, oxyCODONE-acetaminophen, sitaGLIPtin, Vitamin D (Ergocalciferol), mirtazapine,  divalproex, omeprazole, ranitidine, promethazine-dextromethorphan, hydrochlorothiazide, propranolol, glucose blood, ciprofloxacin, and levETIRAcetam.   Meds ordered this encounter  Medications  . bictegravir-emtricitabine-tenofovir AF (BIKTARVY) 50-200-25 MG TABS tablet    Sig: Take 1 tablet by mouth daily.    Dispense:  30 tablet    Refill:  2    Order Specific Question:   Supervising Provider    Answer:   Carlyle Basques [[id number]]     Follow-up: Return in about 1 month (around 05/18/2018), or if symptoms worsen or fail to improve.    Terri Piedra, MSN, FNP-C Nurse Practitioner [facility] Office phone: [phone] Pager: [place] number: [phone]

## 2018-04-17 NOTE — Assessment & Plan Note (Signed)
   Prevnar and 1st Menveo updated today. Will need second Menveo in 8 weeks.   Referred to Janene Madeira, NP for Pap smear/cervical cancer screening  Information provided for [facility] and [facility] to establish with primary care  Referred to [place] [facility] for continued dental care.

## 2018-04-20 ENCOUNTER — Encounter (HOSPITAL_COMMUNITY): Payer: Self-pay | Admitting: *Deleted

## 2018-04-20 ENCOUNTER — Emergency Department (HOSPITAL_COMMUNITY)
Admission: EM | Admit: 2018-04-20 | Discharge: 2018-04-20 | Disposition: A | Discharge status: Home / Self Care | Payer: Medicaid - Out of State | Attending: Emergency Medicine | Admitting: Emergency Medicine

## 2018-04-20 DIAGNOSIS — D259 Leiomyoma of uterus, unspecified: Secondary | ICD-10-CM | POA: Insufficient documentation

## 2018-04-20 DIAGNOSIS — F17210 Nicotine dependence, cigarettes, uncomplicated: Secondary | ICD-10-CM | POA: Diagnosis not present

## 2018-04-20 DIAGNOSIS — B20 Human immunodeficiency virus [HIV] disease: Secondary | ICD-10-CM | POA: Diagnosis not present

## 2018-04-20 DIAGNOSIS — I10 Essential (primary) hypertension: Secondary | ICD-10-CM | POA: Insufficient documentation

## 2018-04-20 DIAGNOSIS — Z760 Encounter for issue of repeat prescription: Secondary | ICD-10-CM | POA: Diagnosis present

## 2018-04-20 DIAGNOSIS — R1032 Left lower quadrant pain: Secondary | ICD-10-CM | POA: Diagnosis not present

## 2018-04-20 DIAGNOSIS — Z79899 Other long term (current) drug therapy: Secondary | ICD-10-CM

## 2018-04-20 LAB — CBG MONITORING, ED: GLUCOSE-CAPILLARY: 80 mg/dL (ref 70–99)

## 2018-04-20 MED ORDER — HYDROCHLOROTHIAZIDE 25 MG PO TABS: 25.0000 mg | ORAL_TABLET | Freq: Every day | ORAL | 1 refills | Status: DC

## 2018-04-20 MED ORDER — INSULIN GLARGINE 100 UNIT/ML ~~LOC~~ SOLN: 10.0000 [IU] | Freq: Every day | SUBCUTANEOUS | 0 refills | Status: DC

## 2018-04-20 MED ORDER — CYCLOBENZAPRINE HCL 10 MG PO TABS: 10.0000 mg | ORAL_TABLET | Freq: Two times a day (BID) | ORAL | 0 refills | Status: DC | PRN

## 2018-04-20 MED ORDER — ALBUTEROL SULFATE (2.5 MG/3ML) 0.083% IN NEBU: 2.5000 mg | INHALATION_SOLUTION | Freq: Four times a day (QID) | RESPIRATORY_TRACT | 0 refills | Status: DC | PRN

## 2018-04-20 MED ORDER — GABAPENTIN 400 MG PO CAPS: 800.0000 mg | ORAL_CAPSULE | Freq: Three times a day (TID) | ORAL | 1 refills | Status: DC

## 2018-04-20 MED ORDER — PROMETHAZINE-DM 6.25-15 MG/5ML PO SYRP: 5.0000 mL | ORAL_SOLUTION | Freq: Four times a day (QID) | ORAL | 0 refills | Status: DC | PRN

## 2018-04-20 MED ORDER — ALBUTEROL SULFATE HFA 108 (90 BASE) MCG/ACT IN AERS: 2.0000 | INHALATION_SPRAY | RESPIRATORY_TRACT | 3 refills | Status: DC | PRN

## 2018-04-20 MED ORDER — GLUCOSE BLOOD VI STRP: ORAL_STRIP | 1 refills | Status: DC

## 2018-04-20 MED ORDER — LORATADINE 10 MG PO TABS: 10.0000 mg | ORAL_TABLET | Freq: Every day | ORAL | 0 refills | Status: DC

## 2018-04-20 MED ORDER — SITAGLIPTIN PHOSPHATE 50 MG PO TABS: 50.0000 mg | ORAL_TABLET | Freq: Every day | ORAL | 0 refills | Status: DC

## 2018-04-20 MED ORDER — PAROXETINE HCL 40 MG PO TABS: 40.0000 mg | ORAL_TABLET | Freq: Every day | ORAL | 1 refills | Status: DC

## 2018-04-20 MED ORDER — BECLOMETHASONE DIPROPIONATE 40 MCG/ACT IN AERS: 2.0000 | INHALATION_SPRAY | Freq: Two times a day (BID) | RESPIRATORY_TRACT | 0 refills | Status: DC | PRN

## 2018-04-20 MED ORDER — OMEPRAZOLE 40 MG PO CPDR: 40.0000 mg | DELAYED_RELEASE_CAPSULE | Freq: Two times a day (BID) | ORAL | 0 refills | Status: DC

## 2018-04-20 MED ORDER — BICTEGRAVIR-EMTRICITAB-TENOFOV 50-200-25 MG PO TABS: 1.0000 | ORAL_TABLET | Freq: Every day | ORAL | 2 refills | Status: DC

## 2018-04-20 MED FILL — PROMETHAZINE W/DM SYRUP: 6.25-15 | 6 days supply | Qty: 118 | Fill #0

## 2018-04-20 MED FILL — ACCU-CHEK AVIVA PLUS STRP: 34 days supply | Qty: 100 | Fill #0

## 2018-04-20 MED FILL — OMEPRAZOLE 40 MG CPDR: 40 | 30 days supply | Qty: 60 | Fill #0

## 2018-04-20 NOTE — ED Triage Notes (Signed)
Pt here requesting medication refills since moving back from [place], pt has antivirals reordered by infectious disease MD, pt needs DM meds and pain meds for fibromyalgia

## 2018-04-20 NOTE — ED Provider Notes (Signed)
[facility] Emergency Department Provider Note MRN:  [id number]  Arrival date & time: 04/20/18     Chief Complaint   Medication Refill   History of Present Illness   Lori Ford is a 43 y.o. year-old female with a history of multiple comorbidities presenting to the ED with chief complaint of medication refill.  Patient is without symptoms or complaints.  Recently moved here, requesting that her home medications be filled were prescribed to her.  Has a PCP appointment scheduled but not for a few weeks.  States that she does not need her controlled substances filled because she understands this is difficult to accomplish.  Review of Systems  ROS A problem-focused ROS was performed. Positive for medication refill.  Patient denies pain.   Patient's Health History    Past Medical History:  Diagnosis Date  . Allergic rhinitis   . Arthritis   . Asthma    as child  . Chronic back pain   . Cigarette nicotine dependence with withdrawal   . DVT (deep venous thrombosis) ([place])   . Gallstones    s/p cholecystectomy  . GERD (gastroesophageal reflux disease)   . HIV (human immunodeficiency virus infection) ([place])   . HTN (hypertension)   . Meningioma ([place])   . Sciatic pain   . Seizure disorder, grand mal ([place])    dx 2005  . Seizures ([place])    due to head trauma as adult    Past Surgical History:  Procedure Laterality Date  . BRAIN SURGERY     2013 to remove a meningioma  . CHOLECYSTECTOMY    . IVC FILTER REMOVAL N/A 10/31/2015   Procedure: IVC Filter Removal;  Surgeon: Adrian Prows, MD;  Location: [place] [facility];  Service: Cardiovascular;  Laterality: N/A;  . PERIPHERAL VASCULAR CATHETERIZATION  10/31/2015   Procedure: IVC/SVC Venography;  Surgeon: Adrian Prows, MD;  Location: [place] [facility];  Service: Cardiovascular;;    Family History  Problem Relation Age of Onset  . Other Mother        varicose vein  . Asthma Mother   . High blood pressure Mother     . Cancer Father   . Cancer Other   . Diabetes Other   . High blood pressure Other   . Asthma Other   . Thyroid disease Other   . Deep vein thrombosis Neg Hx   . Pulmonary embolism Neg Hx     Social History   Socioeconomic History  . Marital status: Single    Spouse name: Not on file  . Number of children: 1  . Years of education: college  . Highest education level: Not on file  Occupational History  . Occupation: Disabled  Social Needs  . Financial resource strain: Not on file  . Food insecurity:    Worry: Not on file    Inability: Not on file  . Transportation needs:    Medical: Not on file    Non-medical: Not on file  Tobacco Use  . Smoking status: Current Every Day Smoker    Packs/day: 0.25    Years: 22.00    Pack years: 5.50    Types: Cigarettes  . Smokeless tobacco: Never Used  Substance and Sexual Activity  . Alcohol use: Yes    Alcohol/week: 0.0 standard drinks    Comment: Socially / Occasional  . Drug use: No    Types: Marijuana    Comment: marijuana/ occasionally; denies on this visit  . Sexual activity:  Never    Partners: Male    Birth control/protection: None  Lifestyle  . Physical activity:    Days per week: Not on file    Minutes per session: Not on file  . Stress: Not on file  Relationships  . Social connections:    Talks on phone: Not on file    Gets together: Not on file    Attends religious service: Not on file    Active member of club or organization: Not on file    Attends meetings of clubs or organizations: Not on file    Relationship status: Not on file  . Intimate partner violence:    Fear of current or ex partner: Not on file    Emotionally abused: Not on file    Physically abused: Not on file    Forced sexual activity: Not on file  Other Topics Concern  . Not on file  Social History Narrative   Lives with fiance.  Does not have a PCP.  [address] Dr. Jimmye Norman, walk in.  Has insurance.  [facility] [address].        Physical Exam  Vital Signs and Nursing Notes reviewed Vitals:   04/20/18 1311  BP: (!) 114/105  Pulse: 84  Resp: 20  Temp: 98.9 F (37.2 C)  SpO2: 100%    CONSTITUTIONAL: Well-appearing, NAD NEURO:  Alert and oriented x 3, no focal deficits EYES:  eyes equal and reactive ENT/NECK:  no LAD, no JVD CARDIO: Regular rate, well-perfused, normal S1 and S2 PULM:  CTAB no wheezing or rhonchi GI/GU:  normal bowel sounds, non-distended, non-tender MSK/SPINE:  No gross deformities, no edema SKIN:  no rash, atraumatic PSYCH:  Appropriate speech and behavior  Diagnostic and Interventional Summary    Labs Reviewed  CBG MONITORING, ED    No orders to display    Medications - No data to display   Procedures Critical Care  ED Course and Medical Decision Making  I have reviewed the triage vital signs and the nursing notes.  Pertinent labs & imaging results that were available during my care of the patient were reviewed by me and considered in my medical decision making (see below for details).  Medications filled, no other complaints or issues, case management consulted at patient's request to help with the cost of these medications.  After the discussed management above, the patient was determined to be safe for discharge.  The patient was in agreement with this plan and all questions regarding their care were answered.  ED return precautions were discussed and the patient will return to the ED with any significant worsening of condition.  Barth Kirks. Sedonia Small, [place] [email or web address]  Final Clinical Impressions(s) / ED Diagnoses     ICD-10-CM   1. Medication refill Z76.0   2. LLQ abdominal pain R10.32 albuterol (PROVENTIL) (2.5 MG/3ML) 0.083% nebulizer solution  3. Uterine leiomyoma, unspecified location D25.9 albuterol (PROVENTIL) (2.5 MG/3ML) 0.083% nebulizer solution  4. HIV disease ([place]) B20  bictegravir-emtricitabine-tenofovir AF (BIKTARVY) 50-200-25 MG TABS tablet  5. Encounter for long-term (current) use of high-risk medication Z79.899 loratadine (CLARITIN) 10 MG tablet    ED Discharge Orders         Ordered    albuterol (PROVENTIL HFA;VENTOLIN HFA) 108 (90 Base) MCG/ACT inhaler  Every 4 hours PRN     04/20/18 1410    albuterol (PROVENTIL) (2.5 MG/3ML) 0.083% nebulizer solution  Every 6 hours PRN  04/20/18 1410    beclomethasone (QVAR) 40 MCG/ACT inhaler  2 times daily PRN     04/20/18 1410    bictegravir-emtricitabine-tenofovir AF (BIKTARVY) 50-200-25 MG TABS tablet  Daily     04/20/18 1416    cyclobenzaprine (FLEXERIL) 10 MG tablet  2 times daily PRN     04/20/18 1416    gabapentin (NEURONTIN) 400 MG capsule  3 times daily     04/20/18 1416    glucose blood (ACCU-CHEK ACTIVE STRIPS) test strip     04/20/18 1416    hydrochlorothiazide (HYDRODIURIL) 25 MG tablet  Daily     04/20/18 1416    insulin glargine (LANTUS) 100 UNIT/ML injection  Daily     04/20/18 1416    loratadine (CLARITIN) 10 MG tablet  Daily     04/20/18 1416    omeprazole (PRILOSEC) 40 MG capsule  2 times daily     04/20/18 1416    PARoxetine (PAXIL) 40 MG tablet  Daily     04/20/18 1416    promethazine-dextromethorphan (PROMETHAZINE-DM) 6.25-15 MG/5ML syrup  4 times daily PRN     04/20/18 1416    sitaGLIPtin (JANUVIA) 50 MG tablet  Daily     04/20/18 1416             Maudie Flakes, MD 04/20/18 1416

## 2018-04-20 NOTE — Discharge Instructions (Signed)
You were evaluated in the Emergency Department and after careful evaluation, we did not find any emergent condition requiring admission or further testing in the hospital. ° °Please return to the Emergency Department if you experience any worsening of your condition.  We encourage you to follow up with a primary care provider.  Thank you for allowing us to be a part of your care. °

## 2018-04-20 NOTE — ED Notes (Signed)
Case management at bedside.

## 2018-04-29 ENCOUNTER — Ambulatory Visit: Payer: Medicaid - Out of State | Admitting: Infectious Diseases

## 2018-04-30 ENCOUNTER — Ambulatory Visit (HOSPITAL_COMMUNITY)
Admission: EM | Admit: 2018-04-30 | Discharge: 2018-04-30 | Disposition: A | Discharge status: Home / Self Care | Payer: Medicaid Other | Attending: Family Medicine | Admitting: Family Medicine

## 2018-04-30 ENCOUNTER — Encounter (HOSPITAL_COMMUNITY): Payer: Self-pay

## 2018-04-30 DIAGNOSIS — J4531 Mild persistent asthma with (acute) exacerbation: Secondary | ICD-10-CM | POA: Insufficient documentation

## 2018-04-30 MED ORDER — GLUCOSE BLOOD VI STRP: ORAL_STRIP | 0 refills | Status: DC

## 2018-04-30 MED ORDER — ALBUTEROL SULFATE (2.5 MG/3ML) 0.083% IN NEBU: 2.5000 mg | INHALATION_SOLUTION | Freq: Four times a day (QID) | RESPIRATORY_TRACT | 12 refills | Status: DC | PRN

## 2018-04-30 NOTE — Discharge Instructions (Signed)
We will give nebulizer and solution I have re written the free style strips an d;meter See your new PCP

## 2018-04-30 NOTE — ED Provider Notes (Signed)
[facility]    CSN: [id number] Arrival date & time: 04/30/18  1506     History   Chief Complaint Chief Complaint  Patient presents with  . Medication Refill    HPI Lori Ford is a 43 y.o. female.   HPI  Patient was just seen in the emergency department on 04/20/2018 for refill of all of her medications.  She is new to the area.  She has an appointment with the PCP in about 2 weeks.  She is here today because she needs her "bronchitis medication."  She indicates that she does not have a nebulizer and her inhalers are not working as well.  She feels like she needs a nebulizer.  She also states that the wrong glucose test strips were ordered and she like this to be a different brand.  She is specifically requesting azithromycin.  She states that she used to get this when she had excessive "mucus".  I explained to her that she needs this only if she has an upper respiratory infection, unless she is having signs of infection such as purulent sputum or fever I would not be giving her azithromycin today.  She has established with the infection control clinic and will be getting her HIV medicine on an ongoing basis. Past Medical History:  Diagnosis Date  . Allergic rhinitis   . Arthritis   . Asthma    as child  . Chronic back pain   . Cigarette nicotine dependence with withdrawal   . DVT (deep venous thrombosis) ([place])   . Gallstones    s/p cholecystectomy  . GERD (gastroesophageal reflux disease)   . HIV (human immunodeficiency virus infection) ([place])   . HTN (hypertension)   . Meningioma ([place])   . Sciatic pain   . Seizure disorder, grand mal ([place])    dx 2005  . Seizures ([place])    due to head trauma as adult    Patient Active Problem List   Diagnosis Date Noted  . Healthcare maintenance 04/17/2018  . 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 Chest pain 12/10/2014  . Chest wall pain 12/10/2014  . Left leg pain 12/10/2014  . Left knee pain 12/10/2014  . Nausea vomiting  and diarrhea 12/10/2014  . Seizures ([place]) 12/10/2014  . Seizure ([place]) 12/10/2014  . Hyperkalemia 12/10/2014  . HIV disease ([place]) 05/11/2014  . Meningioma ([place]) 05/11/2014  . Major depression, recurrent, chronic ([place]) 05/11/2014  . Morbid obesity ([place]) 08/10/2013  . DVT, popliteal, acute, left 10/06/2012  . 36mm Pulmonary nodule, right upper lobe.  repeat CT chest May 2015. 10/06/2012  . Asthma 10/06/2012  . Hypertension 10/06/2012  . GERD (gastroesophageal reflux disease) 10/06/2012  . Cigarette nicotine dependence with withdrawal 10/06/2012  . Seizure disorder, grand mal ([place]) 09/25/2012  . Brain tumor ([place]) 09/25/2012    Past Surgical History:  Procedure Laterality Date  . BRAIN SURGERY     2013 to remove a meningioma  . CHOLECYSTECTOMY    . IVC FILTER REMOVAL N/A 10/31/2015   Procedure: IVC Filter Removal;  Surgeon: Adrian Prows, MD;  Location: [facility];  Service: Cardiovascular;  Laterality: N/A;  . PERIPHERAL VASCULAR CATHETERIZATION  10/31/2015   Procedure: IVC/SVC Venography;  Surgeon: Adrian Prows, MD;  Location: [facility];  Service: Cardiovascular;;    OB History    Gravida  3   Para  1   Term  1   Preterm      AB  2  Living  1     SAB      TAB  2   Ectopic      Multiple      Live Births               Home Medications    Prior to Admission medications   Medication Sig Start Date End Date Taking? Authorizing Provider  albuterol (PROVENTIL HFA;VENTOLIN HFA) 108 (90 Base) MCG/ACT inhaler Inhale 2 puffs into the lungs every 4 (four) hours as needed for wheezing or shortness of breath. 04/20/18   Maudie Flakes, MD  albuterol (PROVENTIL) (2.5 MG/3ML) 0.083% nebulizer solution Take 3 mLs (2.5 mg total) by nebulization every 6 (six) hours as needed for wheezing or shortness of breath. 04/20/18   Maudie Flakes, MD  albuterol (PROVENTIL) (2.5 MG/3ML) 0.083% nebulizer solution Take 3 mLs (2.5 mg total) by nebulization every 6 (six) hours as  needed for wheezing or shortness of breath. 04/30/18   Raylene Everts, MD  amphetamine-dextroamphetamine (ADDERALL XR) 30 MG 24 hr capsule Take 30 mg by mouth 2 (two) times daily.    [provider]  beclomethasone (QVAR) 40 MCG/ACT inhaler Inhale 2 puffs into the lungs 2 (two) times daily as needed (for shortness of breath). 04/20/18   Maudie Flakes, MD  bictegravir-emtricitabine-tenofovir AF (BIKTARVY) 50-200-25 MG TABS tablet Take 1 tablet by mouth daily. 04/20/18   Maudie Flakes, MD  cyclobenzaprine (FLEXERIL) 10 MG tablet Take 1 tablet (10 mg total) by mouth 2 (two) times daily as needed for muscle spasms. 04/20/18   Maudie Flakes, MD  folic acid (FOLVITE) 1 MG tablet Take 1 mg by mouth daily. Reported on 09/06/2015    [provider]  gabapentin (NEURONTIN) 400 MG capsule Take 2 capsules (800 mg total) by mouth 3 (three) times daily. 04/20/18   Maudie Flakes, MD  glucose blood (IGLUCOSE TEST STRIPS) test strip Use as instructed 04/30/18   Raylene Everts, MD  hydrochlorothiazide (HYDRODIURIL) 25 MG tablet Take 1 tablet (25 mg total) by mouth daily. 04/20/18   Maudie Flakes, MD  insulin glargine (LANTUS) 100 UNIT/ML injection Inject 0.1 mLs (10 Units total) into the skin daily. 04/20/18   Maudie Flakes, MD  loratadine (CLARITIN) 10 MG tablet Take 1 tablet (10 mg total) by mouth daily. 04/20/18   Maudie Flakes, MD  mirtazapine (REMERON) 15 MG tablet Take 15 mg by mouth at bedtime. 07/04/16   [provider]  omeprazole (PRILOSEC) 40 MG capsule Take 1 capsule (40 mg total) by mouth 2 (two) times daily. 04/20/18   Maudie Flakes, MD  PARoxetine (PAXIL) 40 MG tablet Take 1 tablet (40 mg total) by mouth daily. 04/20/18   Maudie Flakes, MD  promethazine-dextromethorphan (PROMETHAZINE-DM) 6.25-15 MG/5ML syrup Take 5 mLs by mouth 4 (four) times daily as needed for cough. 04/20/18   Maudie Flakes, MD  sitaGLIPtin (JANUVIA) 50 MG tablet Take 1 tablet (50 mg total) by mouth  daily. 04/20/18   Maudie Flakes, MD  sodium chloride (OCEAN) 0.65 % nasal spray Place 1 spray into the nose 2 (two) times daily as needed for congestion.     [provider]  triamcinolone ointment (KENALOG) 0.5 % Apply 1 application topically 2 (two) times daily.    [provider]  Vitamin D, Ergocalciferol, (DRISDOL) 50000 units CAPS capsule Take 50,000 Units by mouth every 7 (seven) days. [facility] 07/04/16   [provider]  Family History Family History  Problem Relation Age of Onset  . Other Mother        varicose vein  . Asthma Mother   . High blood pressure Mother   . Cancer Father   . Cancer Other   . Diabetes Other   . High blood pressure Other   . Asthma Other   . Thyroid disease Other   . Deep vein thrombosis Neg Hx   . Pulmonary embolism Neg Hx     Social History Social History   Tobacco Use  . Smoking status: Current Every Day Smoker    Packs/day: 0.25    Years: 22.00    Pack years: 5.50    Types: Cigarettes  . Smokeless tobacco: Never Used  Substance Use Topics  . Alcohol use: Yes    Alcohol/week: 0.0 standard drinks    Comment: Socially / Occasional  . Drug use: No    Types: Marijuana    Comment: marijuana/ occasionally; denies on this visit     Allergies   Ibuprofen; Penicillins; Zofran [ondansetron]; Clindamycin/lincomycin; Coconut flavor; Doxycycline hyclate; Flagyl [metronidazole]; Latex; Other; and Aspirin   Review of Systems Review of Systems  Constitutional: Negative for chills and fever.  HENT: Negative for ear pain and sore throat.   Eyes: Negative for pain and visual disturbance.  Respiratory: Positive for cough and wheezing. Negative for shortness of breath.   Cardiovascular: Negative for chest pain and palpitations.  Gastrointestinal: Negative for abdominal pain and vomiting.  Genitourinary: Negative for dysuria and hematuria.  Musculoskeletal: Negative for arthralgias and back pain.  Skin: Negative for  color change and rash.  Neurological: Negative for seizures and syncope.  All other systems reviewed and are negative.    Physical Exam Triage Vital Signs ED Triage Vitals  Enc Vitals Group     BP 04/30/18 1547 132/85     Pulse Rate 04/30/18 1547 79     Resp 04/30/18 1547 18     Temp 04/30/18 1547 98.2 F (36.8 C)     Temp src --      SpO2 04/30/18 1547 100 %   No data found.  Updated Vital Signs BP 132/85   Pulse 79   Temp 98.2 F (36.8 C)   Resp 18   LMP 04/11/2018   SpO2 100%       Physical Exam Constitutional:      General: She is not in acute distress.    Appearance: She is well-developed.  HENT:     Head: Normocephalic and atraumatic.  Eyes:     Conjunctiva/sclera: Conjunctivae normal.     Pupils: Pupils are equal, round, and reactive to light.  Neck:     Musculoskeletal: Normal range of motion.  Cardiovascular:     Rate and Rhythm: Normal rate and regular rhythm.     Heart sounds: Normal heart sounds.  Pulmonary:     Effort: Pulmonary effort is normal. No respiratory distress.     Breath sounds: Wheezing present.     Comments: Scattered wheeze Abdominal:     General: There is no distension.     Palpations: Abdomen is soft.  Musculoskeletal: Normal range of motion.  Skin:    General: Skin is warm and dry.  Neurological:     Mental Status: She is alert.      UC Treatments / Results  Labs (all labs ordered are listed, but only abnormal results are displayed) Labs Reviewed - No data to display  EKG None  Radiology  No results found.  Procedures Procedures (including critical care time)  Medications Ordered in UC Medications - No data to display  Initial Impression / Assessment and Plan / UC Course  I have reviewed the triage vital signs and the nursing notes.  Pertinent labs & imaging results that were available during my care of the patient were reviewed by me and considered in my medical decision making (see chart for details).        Final Clinical Impressions(s) / UC Diagnoses   Final diagnoses:  Asthma in adult, mild persistent, with acute exacerbation     Discharge Instructions     We will give nebulizer and solution I have re written the free style strips an d;meter See your new PCP   ED Prescriptions    Medication Sig Dispense Auth. Provider   albuterol (PROVENTIL) (2.5 MG/3ML) 0.083% nebulizer solution Take 3 mLs (2.5 mg total) by nebulization every 6 (six) hours as needed for wheezing or shortness of breath. 75 mL Raylene Everts, MD   glucose blood (IGLUCOSE TEST STRIPS) test strip Use as instructed 100 each Raylene Everts, MD     Controlled Substance Prescriptions  Controlled Substance Registry consulted? No   Raylene Everts, MD 04/30/18 2055

## 2018-04-30 NOTE — ED Triage Notes (Signed)
Pt presents with complaints of needing medications refilled for "bronchitis and asthma" she has an appointment with her pcp in 3 weeks but needs something until then.

## 2018-05-01 ENCOUNTER — Telehealth (HOSPITAL_COMMUNITY): Payer: Self-pay | Admitting: Emergency Medicine

## 2018-05-01 MED ORDER — ACCU-CHEK AVIVA PLUS W/DEVICE KIT: 1.0000 | PACK | Freq: Once | 0 refills | Status: AC

## 2018-05-01 MED ORDER — ACCU-CHEK SOFT TOUCH LANCETS MISC: 12 refills | Status: DC

## 2018-05-01 NOTE — Telephone Encounter (Signed)
Pharmacy called asking to change meter and strips to different brand so pt insurance covers it. Per Dr. Valere Dross, okay to send correct brand.

## 2018-05-06 ENCOUNTER — Other Ambulatory Visit: Payer: Self-pay

## 2018-05-06 ENCOUNTER — Ambulatory Visit (INDEPENDENT_AMBULATORY_CARE_PROVIDER_SITE_OTHER): Payer: Medicaid Other | Admitting: Nurse Practitioner

## 2018-05-06 ENCOUNTER — Encounter (INDEPENDENT_AMBULATORY_CARE_PROVIDER_SITE_OTHER): Payer: Self-pay | Admitting: Nurse Practitioner

## 2018-05-06 VITALS — BP 125/87 | HR 86 | Temp 99.3°F | Resp 16 | Wt 209.0 lb

## 2018-05-06 DIAGNOSIS — G40909 Epilepsy, unspecified, not intractable, without status epilepticus: Secondary | ICD-10-CM | POA: Diagnosis not present

## 2018-05-06 DIAGNOSIS — Z6838 Body mass index (BMI) 38.0-38.9, adult: Secondary | ICD-10-CM

## 2018-05-06 DIAGNOSIS — B20 Human immunodeficiency virus [HIV] disease: Secondary | ICD-10-CM

## 2018-05-06 DIAGNOSIS — F445 Conversion disorder with seizures or convulsions: Secondary | ICD-10-CM | POA: Diagnosis not present

## 2018-05-06 DIAGNOSIS — E1165 Type 2 diabetes mellitus with hyperglycemia: Secondary | ICD-10-CM | POA: Diagnosis not present

## 2018-05-06 DIAGNOSIS — IMO0002 Reserved for concepts with insufficient information to code with codable children: Secondary | ICD-10-CM

## 2018-05-06 DIAGNOSIS — Z1231 Encounter for screening mammogram for malignant neoplasm of breast: Secondary | ICD-10-CM

## 2018-05-06 DIAGNOSIS — Z86718 Personal history of other venous thrombosis and embolism: Secondary | ICD-10-CM

## 2018-05-06 DIAGNOSIS — F339 Major depressive disorder, recurrent, unspecified: Secondary | ICD-10-CM

## 2018-05-06 DIAGNOSIS — I82432 Acute embolism and thrombosis of left popliteal vein: Secondary | ICD-10-CM

## 2018-05-06 DIAGNOSIS — E118 Type 2 diabetes mellitus with unspecified complications: Principal | ICD-10-CM

## 2018-05-06 DIAGNOSIS — I10 Essential (primary) hypertension: Secondary | ICD-10-CM

## 2018-05-06 LAB — POCT GLYCOSYLATED HEMOGLOBIN (HGB A1C): Hemoglobin A1C: 5.5 % (ref 4.0–5.6)

## 2018-05-06 LAB — GLUCOSE, POCT (MANUAL RESULT ENTRY): POC Glucose: 122 mg/dl — AB (ref 70–99)
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
## 2018-05-06 NOTE — Progress Notes (Signed)
Fall at home r/t leg pain

## 2018-05-07 ENCOUNTER — Telehealth (INDEPENDENT_AMBULATORY_CARE_PROVIDER_SITE_OTHER): Payer: Self-pay | Admitting: Nurse Practitioner

## 2018-05-07 MED FILL — LANTUS 100 UNITS/ML VIAL: 100 | 28 days supply | Qty: 10 | Fill #0

## 2018-05-07 MED FILL — GABAPENTIN 400 MG CAPSULE: 400 | 10 days supply | Qty: 60 | Fill #0

## 2018-05-07 MED FILL — BIKTARVY 50-200-25 MG TABS: 50-200-25 | 30 days supply | Qty: 30 | Fill #0

## 2018-05-07 MED FILL — HYDROCHLOROTHIAZIDE 25 MG T: 25 | 30 days supply | Qty: 30 | Fill #0

## 2018-05-07 MED FILL — ALBUTEROL 0.083% INHAL SOLN: (2.5 MG/3ML | 6 days supply | Qty: 75 | Fill #0

## 2018-05-07 MED FILL — PARoxetine HCL 40 MG TABS: 40 | 30 days supply | Qty: 30 | Fill #0

## 2018-05-07 MED FILL — PROVENTIL HFA 108 (90 BASE): 108 (90 BAS | 17 days supply | Qty: 7 | Fill #0

## 2018-05-07 NOTE — Telephone Encounter (Signed)
FWD to  Z. Raul Del. Nat Christen, CMA

## 2018-05-07 NOTE — Telephone Encounter (Signed)
Patient called in regard to her medication promethazine-dextromethorphan (PROMETHAZINE-DM) 6.25-15 MG/5ML syrup   liraglutide (VICTOZA) 18 MG/3ML SOPN  States that she had a ov yesterday and spoke with the covering PCP. Patient stated that the covering PCP would send a refill and send it to her pharmacy.  Patient uses [facility] at [address] [place], [address]  Please advise [phone]  Thank you Emmit Pomfret

## 2018-05-11 ENCOUNTER — Other Ambulatory Visit: Payer: Self-pay | Admitting: Nurse Practitioner

## 2018-05-11 DIAGNOSIS — E118 Type 2 diabetes mellitus with unspecified complications: Principal | ICD-10-CM

## 2018-05-11 DIAGNOSIS — E1165 Type 2 diabetes mellitus with hyperglycemia: Secondary | ICD-10-CM

## 2018-05-11 DIAGNOSIS — IMO0002 Reserved for concepts with insufficient information to code with codable children: Secondary | ICD-10-CM

## 2018-05-11 LAB — LEVETIRACETAM LEVEL: Levetiracetam Lvl: <1 ug/mL — ABNORMAL LOW (ref 10.0–40.0)

## 2018-05-11 LAB — PHENYTOIN LEVEL, TOTAL: Phenytoin (Dilantin), Serum: <0.8 ug/mL — ABNORMAL LOW (ref 10.0–20.0)

## 2018-05-11 LAB — VALPROIC ACID LEVEL: Valproic Acid Lvl: <4 ug/mL — ABNORMAL LOW (ref 50–100)

## 2018-05-11 LAB — TSH: TSH: 1.29 u[IU]/mL (ref 0.450–4.500)

## 2018-05-11 MED ORDER — LIRAGLUTIDE 18 MG/3ML ~~LOC~~ SOPN: 1.2000 mg | PEN_INJECTOR | Freq: Every day | SUBCUTANEOUS | 3 refills | Status: DC

## 2018-05-11 NOTE — Telephone Encounter (Signed)
Victoza has been sent. I do not prescribe promethazine for any patients

## 2018-05-12 NOTE — Telephone Encounter (Signed)
Patient is aware.  ° °Lori Ford  °

## 2018-05-18 ENCOUNTER — Emergency Department (HOSPITAL_COMMUNITY)
Admission: EM | Admit: 2018-05-18 | Discharge: 2018-05-18 | Disposition: A | Discharge status: Home / Self Care | Payer: Medicaid - Out of State | Attending: Emergency Medicine | Admitting: Emergency Medicine

## 2018-05-18 ENCOUNTER — Encounter (HOSPITAL_COMMUNITY): Payer: Self-pay | Admitting: *Deleted

## 2018-05-18 ENCOUNTER — Other Ambulatory Visit: Payer: Self-pay | Admitting: Nurse Practitioner

## 2018-05-18 DIAGNOSIS — R2243 Localized swelling, mass and lump, lower limb, bilateral: Secondary | ICD-10-CM | POA: Insufficient documentation

## 2018-05-18 DIAGNOSIS — Z5321 Procedure and treatment not carried out due to patient leaving prior to being seen by health care provider: Secondary | ICD-10-CM | POA: Diagnosis not present

## 2018-05-18 MED ORDER — LEVETIRACETAM 500 MG PO TABS: 500.0000 mg | ORAL_TABLET | Freq: Two times a day (BID) | ORAL | 0 refills | Status: DC

## 2018-05-18 NOTE — ED Notes (Signed)
No answer from pt in waiting room 

## 2018-05-18 NOTE — ED Notes (Signed)
Unable to locate x 3

## 2018-05-18 NOTE — ED Notes (Signed)
Unable to locate when called for room x3 

## 2018-05-18 NOTE — ED Triage Notes (Signed)
Pt in c/o increased swelling to her legs, worse to her right leg and is causing discoloration to her foot, symptoms have been going on for the last two weeks, pt states she went to her PCP and was told it was her blood clots, reports they did not start her on a blood thinner, told to come to ED if symptoms worsened

## 2018-05-19 ENCOUNTER — Telehealth (INDEPENDENT_AMBULATORY_CARE_PROVIDER_SITE_OTHER): Payer: Self-pay

## 2018-05-19 ENCOUNTER — Other Ambulatory Visit: Payer: Self-pay

## 2018-05-19 ENCOUNTER — Emergency Department (HOSPITAL_BASED_OUTPATIENT_CLINIC_OR_DEPARTMENT_OTHER)
Admission: EM | Admit: 2018-05-19 | Discharge: 2018-05-19 | Disposition: A | Discharge status: Home / Self Care | Payer: Medicaid Other | Attending: Emergency Medicine | Admitting: Emergency Medicine

## 2018-05-19 ENCOUNTER — Emergency Department (HOSPITAL_BASED_OUTPATIENT_CLINIC_OR_DEPARTMENT_OTHER): Payer: Medicaid Other

## 2018-05-19 ENCOUNTER — Encounter (HOSPITAL_BASED_OUTPATIENT_CLINIC_OR_DEPARTMENT_OTHER): Payer: Self-pay | Admitting: *Deleted

## 2018-05-19 DIAGNOSIS — Z79899 Other long term (current) drug therapy: Secondary | ICD-10-CM | POA: Diagnosis not present

## 2018-05-19 DIAGNOSIS — B20 Human immunodeficiency virus [HIV] disease: Secondary | ICD-10-CM | POA: Insufficient documentation

## 2018-05-19 DIAGNOSIS — M79661 Pain in right lower leg: Secondary | ICD-10-CM | POA: Diagnosis present

## 2018-05-19 DIAGNOSIS — J45909 Unspecified asthma, uncomplicated: Secondary | ICD-10-CM | POA: Diagnosis not present

## 2018-05-19 DIAGNOSIS — I10 Essential (primary) hypertension: Secondary | ICD-10-CM | POA: Diagnosis not present

## 2018-05-19 DIAGNOSIS — G629 Polyneuropathy, unspecified: Secondary | ICD-10-CM

## 2018-05-19 DIAGNOSIS — Z91040 Latex allergy status: Secondary | ICD-10-CM | POA: Insufficient documentation

## 2018-05-19 DIAGNOSIS — F17210 Nicotine dependence, cigarettes, uncomplicated: Secondary | ICD-10-CM | POA: Diagnosis not present

## 2018-05-19 DIAGNOSIS — M79662 Pain in left lower leg: Secondary | ICD-10-CM | POA: Diagnosis not present

## 2018-05-19 DIAGNOSIS — Z86718 Personal history of other venous thrombosis and embolism: Secondary | ICD-10-CM | POA: Insufficient documentation

## 2018-05-19 LAB — BASIC METABOLIC PANEL
Anion gap: 6 (ref 5–15)
BUN: 13 mg/dL (ref 6–20)
CALCIUM: 8.4 mg/dL — AB (ref 8.9–10.3)
CO2: 23 mmol/L (ref 22–32)
Chloride: 106 mmol/L (ref 98–111)
Creatinine, Ser: 0.86 mg/dL (ref 0.44–1.00)
GFR calc Af Amer: >60 mL/min (ref >60)
GFR calc non Af Amer: >60 mL/min (ref >60)
Glucose, Bld: 90 mg/dL (ref 70–99)
Potassium: 3.9 mmol/L (ref 3.5–5.1)
Sodium: 135 mmol/L (ref 135–145)

## 2018-05-19 LAB — CBC
HCT: 36.9 % (ref 36.0–46.0)
Hemoglobin: 11.7 g/dL — ABNORMAL LOW (ref 12.0–15.0)
MCH: 29.9 pg (ref 26.0–34.0)
MCHC: 31.7 g/dL (ref 30.0–36.0)
MCV: 94.4 fL (ref 80.0–100.0)
Platelets: 222 10*3/uL (ref 150–400)
RBC: 3.91 MIL/uL (ref 3.87–5.11)
RDW: 13.7 % (ref 11.5–15.5)
WBC: 5.2 10*3/uL (ref 4.0–10.5)
nRBC: 0 % (ref 0.0–0.2)

## 2018-05-19 MED ORDER — HYDROCODONE-ACETAMINOPHEN 5-325 MG PO TABS
1.0000 | ORAL_TABLET | ORAL | Status: AC
  Administered 2018-05-19: 1 via ORAL
  Filled 2018-05-19: qty 1

## 2018-05-19 MED FILL — ACCU-CHEK AVIVA PLUS TEST S: 34 days supply | Qty: 100 | Fill #1 | Status: TO

## 2018-05-19 MED FILL — LANTUS 100 UNITS/ML VIAL: 100 | 28 days supply | Qty: 10 | Fill #0 | Status: TO

## 2018-05-19 NOTE — ED Notes (Addendum)
Pt c/o bialteral foot and lower leg pain x 2 weeks. Pt has been taking tylenol with little relief. Pt states she can "no longer bear the pain". Pt took 750mg  tylenol at 0800 today with no relief. Pt denies N/V, denies fever. Pt has peeling to soles of feet with bilateral sensitivity and tenderness. Tenderness. Pt restless in stretcher.

## 2018-05-19 NOTE — ED Provider Notes (Signed)
[facility] EMERGENCY DEPARTMENT Provider Note   CSN: [id number] Arrival date & time: 05/19/18  1002     History   Chief Complaint Chief Complaint  Patient presents with  . Leg Pain  . Foot Pain    HPI Lori Ford is a 43 y.o. female.  HPI Patient presented to the emergency room for evaluation of lower extremity pain.  States she has a history of DVT.  She is no longer on any anticoagulants.  Patient started having pain in her bilateral lower extremities for over the past week.  Patient states is a burning discomfort.  It goes from the bottom of her feet up to her knees.  It is more intense and severe.  She saw her primary care doctor recently and was told that most likely was her neuropathy.  No changes in her medications were recommended.  Patient was told if it got worse she should come to the emergency room to make sure she did have a blood clot. Past Medical History:  Diagnosis Date  . Allergic rhinitis   . Arthritis   . Asthma    as child  . Chronic back pain   . Cigarette nicotine dependence with withdrawal   . DVT (deep venous thrombosis) ([place])   . Gallstones    s/p cholecystectomy  . GERD (gastroesophageal reflux disease)   . HIV (human immunodeficiency virus infection) ([place])   . HTN (hypertension)   . Meningioma ([place])   . Sciatic pain   . Seizure disorder, grand mal ([place])    dx 2005  . Seizures ([place])    due to head trauma as adult    Patient Active Problem List   Diagnosis Date Noted  . Healthcare maintenance 04/17/2018  . 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 Chest pain 12/10/2014  . Chest wall pain 12/10/2014  . Left leg pain 12/10/2014  . Left knee pain 12/10/2014  . Nausea vomiting and diarrhea 12/10/2014  . Seizures ([place]) 12/10/2014  . Seizure ([place]) 12/10/2014  . Hyperkalemia 12/10/2014  . HIV disease ([place]) 05/11/2014  . Major depression, recurrent, chronic ([place]) 05/11/2014  . Morbid obesity ([place]) 08/10/2013  . DVT, popliteal, acute,  left 10/06/2012  . 65mm Pulmonary nodule, right upper lobe.  repeat CT chest May 2015. 10/06/2012  . Asthma 10/06/2012  . Hypertension 10/06/2012  . GERD (gastroesophageal reflux disease) 10/06/2012  . Cigarette nicotine dependence with withdrawal 10/06/2012  . Seizure disorder, grand mal ([place]) 09/25/2012    Past Surgical History:  Procedure Laterality Date  . BRAIN SURGERY     2013 to remove a meningioma  . CHOLECYSTECTOMY    . IVC FILTER REMOVAL N/A 10/31/2015   Procedure: IVC Filter Removal;  Surgeon: Adrian Prows, MD;  Location: [facility];  Service: Cardiovascular;  Laterality: N/A;  . PERIPHERAL VASCULAR CATHETERIZATION  10/31/2015   Procedure: IVC/SVC Venography;  Surgeon: Adrian Prows, MD;  Location: [facility];  Service: Cardiovascular;;     OB History    Gravida  3   Para  1   Term  1   Preterm      AB  2   Living  1     SAB      TAB  2   Ectopic      Multiple      Live Births               Home Medications    Prior to Admission medications  Medication Sig Start Date End Date Taking? Authorizing Provider  albuterol (PROVENTIL HFA;VENTOLIN HFA) 108 (90 Base) MCG/ACT inhaler Inhale 2 puffs into the lungs every 4 (four) hours as needed for wheezing or shortness of breath. 04/20/18   Maudie Flakes, MD  albuterol (PROVENTIL) (2.5 MG/3ML) 0.083% nebulizer solution Take 3 mLs (2.5 mg total) by nebulization every 6 (six) hours as needed for wheezing or shortness of breath. 04/20/18   Maudie Flakes, MD  albuterol (PROVENTIL) (2.5 MG/3ML) 0.083% nebulizer solution Take 3 mLs (2.5 mg total) by nebulization every 6 (six) hours as needed for wheezing or shortness of breath. 04/30/18   Raylene Everts, MD  amphetamine-dextroamphetamine (ADDERALL XR) 30 MG 24 hr capsule Take 30 mg by mouth 2 (two) times daily.    [provider]  beclomethasone (QVAR) 40 MCG/ACT inhaler Inhale 2 puffs into the lungs 2 (two) times daily as needed (for shortness  of breath). 04/20/18   Maudie Flakes, MD  bictegravir-emtricitabine-tenofovir AF (BIKTARVY) 50-200-25 MG TABS tablet Take 1 tablet by mouth daily. 04/20/18   Maudie Flakes, MD  cyclobenzaprine (FLEXERIL) 10 MG tablet Take 1 tablet (10 mg total) by mouth 2 (two) times daily as needed for muscle spasms. 04/20/18   Maudie Flakes, MD  divalproex (DEPAKOTE ER) 500 MG 24 hr tablet Take 500 mg by mouth 3 (three) times daily.    [provider]  folic acid (FOLVITE) 1 MG tablet Take 1 mg by mouth daily. Reported on 09/06/2015    [provider]  gabapentin (NEURONTIN) 400 MG capsule Take 2 capsules (800 mg total) by mouth 3 (three) times daily. 04/20/18   Maudie Flakes, MD  glucose blood (IGLUCOSE TEST STRIPS) test strip Use as instructed 04/30/18   Raylene Everts, MD  hydrochlorothiazide (HYDRODIURIL) 25 MG tablet Take 1 tablet (25 mg total) by mouth daily. 04/20/18   Maudie Flakes, MD  insulin glargine (LANTUS) 100 UNIT/ML injection Inject 0.1 mLs (10 Units total) into the skin daily. Patient not taking: Reported on 05/06/2018 04/20/18   Maudie Flakes, MD  Lancets (ACCU-CHEK SOFT [facility]) lancets Use as instructed 05/01/18   Wynona Luna, MD  levETIRAcetam (KEPPRA) 500 MG tablet Take 1 tablet (500 mg total) by mouth 2 (two) times daily for 14 days. After 2 weeks increase to 2 tablets twice per day by mouth. 05/18/18 06/01/18  Gildardo Pounds, NP  liraglutide (VICTOZA) 18 MG/3ML SOPN Inject 0.2 mLs (1.2 mg total) into the skin daily at 2 PM for 30 days. 05/11/18 06/10/18  Gildardo Pounds, NP  loratadine (CLARITIN) 10 MG tablet Take 1 tablet (10 mg total) by mouth daily. 04/20/18   Maudie Flakes, MD  LORazepam (ATIVAN) 2 MG tablet Take 2 mg by mouth 2 (two) times daily.    [provider]  mirtazapine (REMERON) 15 MG tablet Take 15 mg by mouth at bedtime. 07/04/16   [provider]  omeprazole (PRILOSEC) 40 MG capsule Take 1 capsule (40 mg total) by mouth 2 (two) times  daily. 04/20/18   Maudie Flakes, MD  oxyCODONE-acetaminophen (PERCOCET) 10-325 MG tablet Take 1 tablet by mouth 3 (three) times daily as needed for pain.    [provider]  PARoxetine (PAXIL) 40 MG tablet Take 1 tablet (40 mg total) by mouth daily. 04/20/18   Maudie Flakes, MD  phenytoin (DILANTIN) 200 MG ER capsule Take 200 mg by mouth 3 (three) times daily.    [provider]  promethazine-dextromethorphan (PROMETHAZINE-DM) 6.25-15 MG/5ML syrup Take 5 mLs by mouth 4 (four) times daily as needed for cough. 04/20/18   Maudie Flakes, MD  sitaGLIPtin (JANUVIA) 50 MG tablet Take 1 tablet (50 mg total) by mouth daily. 04/20/18   Maudie Flakes, MD  sodium chloride (OCEAN) 0.65 % nasal spray Place 1 spray into the nose 2 (two) times daily as needed for congestion.     [provider]  triamcinolone ointment (KENALOG) 0.5 % Apply 1 application topically 2 (two) times daily.    [provider]  Vitamin D, Ergocalciferol, (DRISDOL) 50000 units CAPS capsule Take 50,000 Units by mouth every 7 (seven) days. [facility] 07/04/16   [provider]    Family History Family History  Problem Relation Age of Onset  . Other Mother        varicose vein  . Asthma Mother   . High blood pressure Mother   . Cancer Father   . Cancer Other   . Diabetes Other   . High blood pressure Other   . Asthma Other   . Thyroid disease Other   . Deep vein thrombosis Neg Hx   . Pulmonary embolism Neg Hx     Social History Social History   Tobacco Use  . Smoking status: Current Every Day Smoker    Packs/day: 0.25    Years: 22.00    Pack years: 5.50    Types: Cigarettes  . Smokeless tobacco: Never Used  Substance Use Topics  . Alcohol use: Yes    Alcohol/week: 0.0 standard drinks    Comment: Socially / Occasional  . Drug use: No    Types: Marijuana    Comment: not for 30 days     Allergies   Ibuprofen; Penicillins; Zofran [ondansetron]; Clindamycin/lincomycin;  Coconut flavor; Doxycycline hyclate; Flagyl [metronidazole]; Latex; Other; and Aspirin   Review of Systems Review of Systems  All other systems reviewed and are negative.    Physical Exam Updated Vital Signs BP 134/88 (BP Location: Right Arm)   Pulse 70   Temp 98.1 F (36.7 C) (Oral)   Resp 18   Ht 1.575 m (5\' 2" )   Wt 94.8 kg   LMP 05/16/2018 (Exact Date)   SpO2 100%   BMI 38.23 kg/m   Physical Exam Vitals signs and nursing note reviewed.  Constitutional:      Appearance: She is well-developed.     Comments: Uncomfortable appearing, restless and fidgeting  HENT:     Head: Normocephalic and atraumatic.     Right Ear: External ear normal.     Left Ear: External ear normal.  Eyes:     General: No scleral icterus.       Right eye: No discharge.        Left eye: No discharge.     Conjunctiva/sclera: Conjunctivae normal.  Neck:     Musculoskeletal: Neck supple.     Trachea: No tracheal deviation.  Cardiovascular:     Rate and Rhythm: Normal rate and regular rhythm.  Pulmonary:     Effort: Pulmonary effort is normal. No respiratory distress.     Breath sounds: Normal breath sounds. No stridor. No wheezing or rales.  Abdominal:     General: Bowel sounds are normal. There is no distension.     Palpations: Abdomen is soft.     Tenderness: There is no abdominal tenderness. There is no guarding or rebound.  Musculoskeletal:        General: Tenderness  present.     Right lower leg: No edema.     Left lower leg: No edema.     Comments: Tenderness to very light palpation of bilateral lower extremities, her extremities are warm and well perfused, normal pulses  Skin:    General: Skin is warm and dry.     Findings: No rash.  Neurological:     Mental Status: She is alert.     Cranial Nerves: No cranial nerve deficit (no facial droop, extraocular movements intact, no slurred speech).     Sensory: No sensory deficit.     Motor: No abnormal muscle tone or seizure activity.      Coordination: Coordination normal.      ED Treatments / Results  Labs (all labs ordered are listed, but only abnormal results are displayed) Labs Reviewed  CBC - Abnormal; Notable for the following components:      Result Value   Hemoglobin 11.7 (*)    All other components within normal limits  BASIC METABOLIC PANEL - Abnormal; Notable for the following components:   Calcium 8.4 (*)    All other components within normal limits    EKG None  Radiology US Venous Img Lower Bilateral  Result Date: 05/19/2018 CLINICAL DATA:  Bilateral lower extremity pain and edema. History of cervical cancer. History of prior DVT. Evaluate for acute or chronic DVT. EXAM: BILATERAL LOWER EXTREMITY VENOUS DOPPLER ULTRASOUND TECHNIQUE: Gray-scale sonography with graded compression, as well as color Doppler and duplex ultrasound were performed to evaluate the lower extremity deep venous systems from the level of the common femoral vein and including the common femoral, femoral, profunda femoral, popliteal and calf veins including the posterior tibial, peroneal and gastrocnemius veins when visible. The superficial great saphenous vein was also interrogated. Spectral Doppler was utilized to evaluate flow at rest and with distal augmentation maneuvers in the common femoral, femoral and popliteal veins. COMPARISON:  None. FINDINGS: RIGHT LOWER EXTREMITY Common Femoral Vein: No evidence of thrombus. Normal compressibility, respiratory phasicity and response to augmentation. Saphenofemoral Junction: No evidence of thrombus. Normal compressibility and flow on color Doppler imaging. Profunda Femoral Vein: No evidence of thrombus. Normal compressibility and flow on color Doppler imaging. Femoral Vein: No evidence of thrombus. Normal compressibility, respiratory phasicity and response to augmentation. Popliteal Vein: No evidence of thrombus. Normal compressibility, respiratory phasicity and response to augmentation. Calf Veins:  No evidence of thrombus. Normal compressibility and flow on color Doppler imaging. Superficial Great Saphenous Vein: No evidence of thrombus. Normal compressibility. Venous Reflux:  None. Other Findings:  None. LEFT LOWER EXTREMITY Common Femoral Vein: No evidence of thrombus. Normal compressibility, respiratory phasicity and response to augmentation. Saphenofemoral Junction: No evidence of thrombus. Normal compressibility and flow on color Doppler imaging. Profunda Femoral Vein: No evidence of thrombus. Normal compressibility and flow on color Doppler imaging. Femoral Vein: No evidence of thrombus. Normal compressibility, respiratory phasicity and response to augmentation. Popliteal Vein: No evidence of thrombus. Normal compressibility, respiratory phasicity and response to augmentation. Calf Veins: No evidence of thrombus. Normal compressibility and flow on color Doppler imaging. Superficial Great Saphenous Vein: No evidence of thrombus. Normal compressibility. Venous Reflux: A minimal amount of venous reflux is demonstrated within the mid (image 15) and distal (image 19) aspects of the left femoral vein as well as the left popliteal vein (image 23). Other Findings:  None. IMPRESSION: 1. No evidence of DVT within either lower extremity. 2. Suspected minimal amount of venous reflux within the left femoral and popliteal veins.  Electronically Signed   By: Sandi Mariscal M.D.   On: 05/19/2018 13:04    Procedures Procedures (including critical care time)  Medications Ordered in ED Medications  HYDROcodone-acetaminophen (NORCO/VICODIN) 5-325 MG per tablet 1 tablet (1 tablet Oral Given 05/19/18 1108)     Initial Impression / Assessment and Plan / ED Course  I have reviewed the triage vital signs and the nursing notes.  Pertinent labs & imaging results that were available during my care of the patient were reviewed by me and considered in my medical decision making (see chart for details).   Patient presented  to the emergency room for bilateral lower extremity pain.  Patient was sent to evaluate for the possibility of DVT.  Patient's ED work-up is reassuring.  Laboratory tests and ultrasound are unremarkable.  I suspect the patient's symptoms are most likely related to her neuropathy.  Patient is already on 800 mg 3 times a day.  I am not sure that increasing her dose would be worthwhile at this time.  No signs of acute emergency medical condition.  Patient will follow-up with her primary care doctor for further management.  Final Clinical Impressions(s) / ED Diagnoses   Final diagnoses:  Neuropathy    ED Discharge Orders    None       Dorie Rank, MD 05/19/18 1355

## 2018-05-19 NOTE — Discharge Instructions (Addendum)
Continue your current medications, as we discussed contact your doctor about whether or not to increase your Neurontin

## 2018-05-19 NOTE — ED Notes (Signed)
Pt verbalized understanding of dc instructions.

## 2018-05-19 NOTE — ED Triage Notes (Signed)
Pt has had pain in her feet and legs, she is diabetic and feels a clot in her feet. She feels that she might have a blood clot.

## 2018-05-19 NOTE — Telephone Encounter (Signed)
Patient is aware that her keppra, dilantin and depakote levels are low. Low dose keppra 500 mg BID has been sent to her pharmacy. She is aware that if she is able to tolerate this then increase to 2 tablets BID after 2 weeks. Return to clinic in 4 weeks to recheck keppra levels. Seizure medications deferred until established with neurology. Nat Christen, CMA

## 2018-05-19 NOTE — Telephone Encounter (Signed)
-----   Message from Gildardo Pounds, NP sent at 05/18/2018  8:30 AM EST ----- Your labs show low keppra, dilantin and depakote levels. Will start you on low dose keppra 500mg  twice daily at this time. If you are able to tolerate then increase to 2 tablets twice a day after 2 weeks. Please make a lab appointment in 4 weeks to recheck your keppra level. I will defer the other seizure medications to neurology once you have established with them.

## 2018-05-20 ENCOUNTER — Ambulatory Visit: Payer: Medicaid - Out of State | Admitting: Infectious Diseases

## 2018-05-21 LAB — HM DIABETES EYE EXAM

## 2018-05-25 ENCOUNTER — Encounter: Payer: Self-pay | Admitting: Infectious Diseases

## 2018-06-05 ENCOUNTER — Ambulatory Visit: Payer: Medicaid - Out of State

## 2018-06-05 ENCOUNTER — Ambulatory Visit
Admission: RE | Admit: 2018-06-05 | Discharge: 2018-06-05 | Disposition: A | Discharge status: Home / Self Care | Payer: Medicaid Other | Source: Ambulatory Visit | Attending: Nurse Practitioner | Admitting: Nurse Practitioner

## 2018-06-05 DIAGNOSIS — Z1231 Encounter for screening mammogram for malignant neoplasm of breast: Secondary | ICD-10-CM

## 2018-06-10 ENCOUNTER — Encounter: Payer: Self-pay | Admitting: Infectious Diseases

## 2018-06-19 ENCOUNTER — Other Ambulatory Visit: Payer: Self-pay | Admitting: Pharmacist

## 2018-06-19 MED ORDER — INSULIN GLARGINE 100 UNIT/ML ~~LOC~~ SOLN: 10.0000 [IU] | Freq: Every day | SUBCUTANEOUS | 0 refills | Status: DC

## 2018-07-29 NOTE — Progress Notes (Signed)
Prescreened pt for 4-16 appt.

## 2018-07-30 ENCOUNTER — Ambulatory Visit: Payer: Medicaid Other | Admitting: Gastroenterology

## 2018-07-30 ENCOUNTER — Other Ambulatory Visit: Payer: Self-pay

## 2018-07-30 VITALS — Ht 62.0 in | Wt 223.0 lb

## 2018-07-30 NOTE — Progress Notes (Signed)
Patient no showed - called and [facility], no response, she will be rescheduled

## 2018-07-31 ENCOUNTER — Other Ambulatory Visit: Payer: Self-pay | Admitting: Family Medicine

## 2018-07-31 MED FILL — ACCU-CHEK AVIVA PLUS STRP: 30 days supply | Qty: 50 | Fill #0 | Status: TO

## 2018-08-04 MED FILL — LANTUS 100 UNITS/ML VIAL: 100 | 28 days supply | Qty: 10 | Fill #0

## 2018-08-05 ENCOUNTER — Ambulatory Visit: Payer: Medicaid Other | Attending: Primary Care | Admitting: Primary Care

## 2018-08-05 ENCOUNTER — Encounter: Payer: Self-pay | Admitting: Primary Care

## 2018-08-05 ENCOUNTER — Other Ambulatory Visit: Payer: Self-pay

## 2018-08-05 ENCOUNTER — Ambulatory Visit (INDEPENDENT_AMBULATORY_CARE_PROVIDER_SITE_OTHER): Payer: Medicaid - Out of State | Admitting: Primary Care

## 2018-08-05 DIAGNOSIS — IMO0002 Reserved for concepts with insufficient information to code with codable children: Secondary | ICD-10-CM

## 2018-08-05 DIAGNOSIS — Z79899 Other long term (current) drug therapy: Secondary | ICD-10-CM | POA: Diagnosis not present

## 2018-08-05 DIAGNOSIS — R569 Unspecified convulsions: Secondary | ICD-10-CM | POA: Diagnosis not present

## 2018-08-05 DIAGNOSIS — E118 Type 2 diabetes mellitus with unspecified complications: Secondary | ICD-10-CM

## 2018-08-05 DIAGNOSIS — K219 Gastro-esophageal reflux disease without esophagitis: Secondary | ICD-10-CM | POA: Diagnosis not present

## 2018-08-05 DIAGNOSIS — I10 Essential (primary) hypertension: Secondary | ICD-10-CM | POA: Insufficient documentation

## 2018-08-05 DIAGNOSIS — Z86718 Personal history of other venous thrombosis and embolism: Secondary | ICD-10-CM | POA: Insufficient documentation

## 2018-08-05 DIAGNOSIS — E1165 Type 2 diabetes mellitus with hyperglycemia: Secondary | ICD-10-CM | POA: Diagnosis present

## 2018-08-05 DIAGNOSIS — B20 Human immunodeficiency virus [HIV] disease: Secondary | ICD-10-CM | POA: Insufficient documentation

## 2018-08-05 DIAGNOSIS — J4520 Mild intermittent asthma, uncomplicated: Secondary | ICD-10-CM | POA: Diagnosis not present

## 2018-08-05 MED ORDER — BUDESONIDE-FORMOTEROL FUMARATE 80-4.5 MCG/ACT IN AERO: 2.0000 | INHALATION_SPRAY | Freq: Two times a day (BID) | RESPIRATORY_TRACT | 3 refills | Status: DC

## 2018-08-05 MED ORDER — ALBUTEROL SULFATE HFA 108 (90 BASE) MCG/ACT IN AERS: 2.0000 | INHALATION_SPRAY | RESPIRATORY_TRACT | 3 refills | Status: DC | PRN

## 2018-08-05 NOTE — Progress Notes (Signed)
Patient verified DOB Patient has taken medication  Patient has bre

## 2018-08-05 NOTE — Progress Notes (Signed)
Virtual Visit via Telephone Note  I connected with Lori Ford on 08/05/18 at  9:58 AM EDT by telephone and verified that I am speaking with the correct person using two identifiers.   I discussed the limitations, risks, security and privacy concerns of performing an evaluation and management service by telephone and the availability of in person appointments. I also discussed with the patient that there may be a patient responsible charge related to this service. The patient expressed understanding and agreed to proceed.   History of Present Illness: She is being f/u for diabetes and HTN. She has a PMH of  seizures, pain in her back she feels is related to her large breast, HIV and GERD she had a GI appt on 07/30/2018 no show and  Hx of DVT .    Observations/Objective: Review of Systems  Constitutional: Negative.   HENT: Positive for congestion, ear pain and sinus pain.        Right pain  Eyes: Positive for blurred vision.       Watery  Respiratory: Positive for cough, shortness of breath and wheezing.   Cardiovascular: Positive for chest pain.  Gastrointestinal: Negative.   Genitourinary: Positive for frequency and urgency.  Musculoskeletal: Positive for back pain.       Pain legs  Skin: Positive for itching.  Neurological: Positive for seizures and headaches.  Psychiatric/Behavioral: The patient is nervous/anxious and has insomnia.     Assessment and Plan: Diagnoses and all orders for this visit:  Diabetes mellitus type 2, uncontrolled, with complications ([place])  Last A1C 05/06/2018 1.2mg  on Victoza will cont until repeat labs   HIV disease ([place]) Followed by ID Dr. Wilder Glade  Seizures [facility]) On Keppra bid d/c from 2 neurologist offices will try another providers to manage seizures  Intermittent asthma, unspecified asthma severity, unspecified whether complicated Using albuterol too much changed to Symbicort and f/u with Dr. Joya Gaskins pulmonologist  Other orders -      budesonide-formoterol (SYMBICORT) 80-4.5 MCG/ACT inhaler; Inhale 2 puffs into the lungs 2 (two) times daily for 60 doses. -     albuterol (VENTOLIN HFA) 108 (90 Base) MCG/ACT inhaler; Inhale 2 puffs into the lungs every 4 (four) hours as needed for wheezing or shortness of breath.    Follow Up Instructions:    I discussed the assessment and treatment plan with the patient. The patient was provided an opportunity to ask questions and all were answered. The patient agreed with the plan and demonstrated an understanding of the instructions.   The patient was advised to call back or seek an in-person evaluation if the symptoms worsen or if the condition fails to improve as anticipated.  I provided 30 minutes of non-face-to-face time during this encounter.   Kerin Perna, NP

## 2018-08-07 ENCOUNTER — Telehealth: Payer: Self-pay

## 2018-08-07 ENCOUNTER — Other Ambulatory Visit: Payer: Self-pay | Admitting: Nurse Practitioner

## 2018-08-07 MED ORDER — GLUCOSE BLOOD VI STRP: ORAL_STRIP | 2 refills | Status: DC

## 2018-08-07 NOTE — Telephone Encounter (Signed)
1) Medication(s) Requested (by name): glucose blood (IGLUCOSE TEST STRIPS) test strip  promethazine-dextromethorphan (PROMETHAZINE-DM) 6.25-15 MG/5ML syrup  2) Pharmacy of [facility]: [facility] on [address]) Special Requests:   Approved medications will be sent to the pharmacy, we will reach out if there is an issue.  Requests made after 3pm may not be addressed until the following business day!  If a patient is unsure of the name of the medication(s) please note and ask patient to call back when they are able to provide all info, do not send to responsible party until all information is available!

## 2018-08-09 NOTE — Telephone Encounter (Signed)
Promethazine was denied. Strips have been sent.

## 2018-08-10 ENCOUNTER — Encounter (HOSPITAL_COMMUNITY): Payer: Self-pay | Admitting: Emergency Medicine

## 2018-08-10 ENCOUNTER — Ambulatory Visit (HOSPITAL_COMMUNITY)
Admission: EM | Admit: 2018-08-10 | Discharge: 2018-08-10 | Disposition: A | Discharge status: Home / Self Care | Payer: Medicaid Other | Attending: Family Medicine | Admitting: Family Medicine

## 2018-08-10 DIAGNOSIS — F419 Anxiety disorder, unspecified: Secondary | ICD-10-CM | POA: Diagnosis not present

## 2018-08-10 NOTE — ED Provider Notes (Signed)
[facility]    CSN: [id number] Arrival date & time: 08/10/18  1602     History   Chief Complaint Chief Complaint  Patient presents with  . Anxiety    HPI Lori Ford is a 43 y.o. female.   Lori Ford presents with complaints of anxiety and medication refill request. States she has history of anxiety and claustrophobia, she has been out of her medications for 2 weeks and the office which she typically fills these medications has been closed and not answering her calls. States she needs her alprazolam. Denies suicidal or homicidal ideation. No shortness of breath. No chest pain .    ROS per HPI, negative if not otherwise mentioned.      Past Medical History:  Diagnosis Date  . Allergic rhinitis   . Arthritis   . Asthma    as child  . Cervical cancer ([place]) 2006  . Chronic back pain   . Cigarette nicotine dependence with withdrawal   . DVT (deep venous thrombosis) ([place])   . Gallstones    s/p cholecystectomy  . GERD (gastroesophageal reflux disease)   . HIV (human immunodeficiency virus infection) ([place])   . HTN (hypertension)   . Meningioma ([place])   . Sciatic pain   . Seizure disorder, grand mal ([place])    dx 2005  . Seizures ([place])    due to head trauma as adult    Patient Active Problem List   Diagnosis Date Noted  . Healthcare maintenance 04/17/2018  . 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 Chest pain 12/10/2014  . Chest wall pain 12/10/2014  . Left leg pain 12/10/2014  . Left knee pain 12/10/2014  . Nausea vomiting and diarrhea 12/10/2014  . Seizures ([place]) 12/10/2014  . Seizure ([place]) 12/10/2014  . Hyperkalemia 12/10/2014  . HIV disease ([place]) 05/11/2014  . Major depression, recurrent, chronic ([place]) 05/11/2014  . Morbid obesity ([place]) 08/10/2013  . DVT, popliteal, acute, left 10/06/2012  . 88mm Pulmonary nodule, right upper lobe.  repeat CT chest May 2015. 10/06/2012  . Asthma 10/06/2012  . Hypertension 10/06/2012  . GERD (gastroesophageal  reflux disease) 10/06/2012  . Cigarette nicotine dependence with withdrawal 10/06/2012  . Seizure disorder, grand mal ([place]) 09/25/2012    Past Surgical History:  Procedure Laterality Date  . BRAIN SURGERY     2013 to remove a meningioma  . CHOLECYSTECTOMY    . IVC FILTER REMOVAL N/A 10/31/2015   Procedure: IVC Filter Removal;  Surgeon: Adrian Prows, MD;  Location: [facility];  Service: Cardiovascular;  Laterality: N/A;  . PERIPHERAL VASCULAR CATHETERIZATION  10/31/2015   Procedure: IVC/SVC Venography;  Surgeon: Adrian Prows, MD;  Location: [facility];  Service: Cardiovascular;;    OB History    Gravida  3   Para  1   Term  1   Preterm      AB  2   Living  1     SAB      TAB  2   Ectopic      Multiple      Live Births               Home Medications    Prior to Admission medications   Medication Sig Start Date End Date Taking? Authorizing Provider  albuterol (VENTOLIN HFA) 108 (90 Base) MCG/ACT inhaler Inhale 2 puffs into the lungs every 4 (four) hours as needed for wheezing or shortness of breath. 08/05/18   Kerin Perna,  NP  ALPRAZolam (XANAX) 1 MG tablet Take by mouth.    [provider]  amphetamine-dextroamphetamine (ADDERALL XR) 30 MG 24 hr capsule Take 30 mg by mouth 2 (two) times daily.    [provider]  bictegravir-emtricitabine-tenofovir AF (BIKTARVY) 50-200-25 MG TABS tablet Take 1 tablet by mouth daily. 04/20/18   Maudie Flakes, MD  budesonide-formoterol (SYMBICORT) 80-4.5 MCG/ACT inhaler Inhale 2 puffs into the lungs 2 (two) times daily for 60 doses. 08/05/18 09/04/18  Kerin Perna, NP  cyclobenzaprine (FLEXERIL) 10 MG tablet Take 1 tablet (10 mg total) by mouth 2 (two) times daily as needed for muscle spasms. 04/20/18   Maudie Flakes, MD  divalproex (DEPAKOTE ER) 500 MG 24 hr tablet Take 500 mg by mouth 3 (three) times daily.    [provider]  folic acid (FOLVITE) 1 MG tablet Take 1 mg by mouth  daily. Reported on 09/06/2015    [provider]  gabapentin (NEURONTIN) 400 MG capsule Take 2 capsules (800 mg total) by mouth 3 (three) times daily. 04/20/18   Maudie Flakes, MD  glucose blood (IGLUCOSE TEST STRIPS) test strip Use as instructed 08/07/18   Charlott Rakes, MD  hydrochlorothiazide (HYDRODIURIL) 25 MG tablet Take 1 tablet (25 mg total) by mouth daily. 04/20/18   Maudie Flakes, MD  Lancets (ACCU-CHEK SOFT TOUCH) lancets Use as instructed 05/01/18   Wynona Luna, MD  LANTUS 100 UNIT/ML injection INJECT 0.1 MILLILITERS (10 UNITS TOTAL) INTO THE SKIN DAILY. MUST KEEP APPT IN APRIL FOR MORE REFILLS. 08/04/18   Charlott Rakes, MD  levETIRAcetam (KEPPRA) 500 MG tablet Take 1 tablet (500 mg total) by mouth 2 (two) times daily for 14 days. After 2 weeks increase to 2 tablets twice per day by mouth. 05/18/18 08/05/18  Gildardo Pounds, NP  liraglutide (VICTOZA) 18 MG/3ML SOPN Inject 0.2 mLs (1.2 mg total) into the skin daily at 2 PM for 30 days. 05/11/18 08/05/18  Gildardo Pounds, NP  loratadine (CLARITIN) 10 MG tablet Take 1 tablet (10 mg total) by mouth daily. 04/20/18   Maudie Flakes, MD  mirtazapine (REMERON) 15 MG tablet Take 15 mg by mouth at bedtime. 07/04/16   [provider]  omeprazole (PRILOSEC) 40 MG capsule Take 1 capsule (40 mg total) by mouth 2 (two) times daily. 04/20/18   Maudie Flakes, MD  oxyCODONE-acetaminophen (PERCOCET) 10-325 MG tablet Take 1 tablet by mouth 3 (three) times daily as needed for pain.    [provider]  PARoxetine (PAXIL) 40 MG tablet Take 1 tablet (40 mg total) by mouth daily. 04/20/18   Maudie Flakes, MD  phenytoin (DILANTIN) 200 MG ER capsule Take 200 mg by mouth 3 (three) times daily.    [provider]  promethazine-dextromethorphan (PROMETHAZINE-DM) 6.25-15 MG/5ML syrup Take 5 mLs by mouth 4 (four) times daily as needed for cough. 04/20/18   Maudie Flakes, MD  sitaGLIPtin (JANUVIA) 50 MG tablet Take 1 tablet (50 mg  total) by mouth daily. 04/20/18   Maudie Flakes, MD  sodium chloride (OCEAN) 0.65 % nasal spray Place 1 spray into the nose 2 (two) times daily as needed for congestion.     [provider]  triamcinolone ointment (KENALOG) 0.5 % Apply 1 application topically 2 (two) times daily.    [provider]  Vitamin D, Ergocalciferol, (DRISDOL) 50000 units CAPS capsule Take 50,000 Units by mouth every 7 (seven) days. [facility] 07/04/16   [provider]  Family History Family History  Problem Relation Age of Onset  . Other Mother        varicose vein  . Asthma Mother   . High blood pressure Mother   . Colon cancer Father 67  . Cancer Other   . Diabetes Other   . High blood pressure Other   . Asthma Other   . Thyroid disease Other   . Breast cancer Maternal Aunt   . Breast cancer Paternal Grandmother   . Deep vein thrombosis Neg Hx   . Pulmonary embolism Neg Hx     Social History Social History   Tobacco Use  . Smoking status: Current Every Day Smoker    Packs/day: 0.25    Years: 22.00    Pack years: 5.50    Types: Cigarettes  . Smokeless tobacco: Never Used  . Tobacco comment: 07-29-18: Pt indicates she is down to 1 a day  Substance Use Topics  . Alcohol use: Yes    Alcohol/week: 0.0 standard drinks    Comment: Socially / Occasional  . Drug use: No    Types: Marijuana    Comment: not for 30 days     Allergies   Ibuprofen; Penicillins; Zofran [ondansetron]; Clindamycin/lincomycin; Coconut flavor; Doxycycline hyclate; Flagyl [metronidazole]; Latex; Other; Tylenol [acetaminophen]; and Aspirin   Review of Systems Review of Systems   Physical Exam Triage Vital Signs ED Triage Vitals  Enc Vitals Group     BP 08/10/18 1631 133/84     Pulse Rate 08/10/18 1631 81     Resp 08/10/18 1631 18     Temp 08/10/18 1631 98.9 F (37.2 C)     Temp src --      SpO2 08/10/18 1631 95 %     Weight --      Height --      Head Circumference --      Peak  Flow --      Pain Score 08/10/18 1632 0     Pain Loc --      Pain Edu? --      Excl. in May? --    No data found.  Updated Vital Signs BP 133/84   Pulse 81   Temp 98.9 F (37.2 C)   Resp 18   LMP 08/07/2018   SpO2 95%    Physical Exam Constitutional:      General: She is not in acute distress.    Appearance: She is well-developed.  Cardiovascular:     Rate and Rhythm: Normal rate and regular rhythm.     Heart sounds: Normal heart sounds.  Pulmonary:     Effort: Pulmonary effort is normal.     Breath sounds: Normal breath sounds.  Skin:    General: Skin is warm and dry.  Neurological:     General: No focal deficit present.     Mental Status: She is alert and oriented to person, place, and time.  Psychiatric:        Mood and Affect: Mood normal.        Behavior: Behavior normal.        Thought Content: Thought content normal.      UC Treatments / Results  Labs (all labs ordered are listed, but only abnormal results are displayed) Labs Reviewed - No data to display  EKG None  Radiology No results found.  Procedures Procedures (including critical care time)  Medications Ordered in UC Medications - No data to display  Initial Impression / Assessment and  Plan / UC Course  I have reviewed the triage vital signs and the nursing notes.  Pertinent labs & imaging results that were available during my care of the patient were reviewed by me and considered in my medical decision making (see chart for details).     Discussed with patient that controlled medications are not managed through UC. No acute distress currently. No SI/HI. Recommended follow up with psychiatry and/or PCP for management of medications. Patient verbalized understanding and agreeable to plan.   Final Clinical Impressions(s) / UC Diagnoses   Final diagnoses:  Anxiety     Discharge Instructions     We are unable to manage your controlled substance medications here through UC  unfortunately.  I would recommend following up with your psychiatry office and/or your primary care clinic for management and refill of your medications for anxiety.  If you develop any acutely worsening of symptoms, suicidal or homicidal ideations please go to the ER.     ED Prescriptions    None     Controlled Substance Prescriptions [place] Controlled Substance Registry consulted? Not Applicable   Zigmund Gottron, NP 08/10/18 1756

## 2018-08-10 NOTE — ED Triage Notes (Signed)
Pt c/o anxiety x2 weeks, states shes been out of her medicine for that time, states she tried calling her doctor and the office is closed.

## 2018-08-10 NOTE — Discharge Instructions (Signed)
We are unable to manage your controlled substance medications here through UC unfortunately.  I would recommend following up with your psychiatry office and/or your primary care clinic for management and refill of your medications for anxiety.  If you develop any acutely worsening of symptoms, suicidal or homicidal ideations please go to the ER.

## 2018-08-19 ENCOUNTER — Ambulatory Visit: Payer: Medicaid Other | Admitting: Critical Care Medicine

## 2018-08-19 NOTE — Progress Notes (Deleted)
   Subjective:    Patient ID: Lori Ford, female    DOB: 01-Oct-1975, 43 y.o.   MRN: [id number]  42 y.o.F referred for asthma evaluation.   Asthma  Her past medical history is significant for asthma.      Review of Systems     Objective:   Physical Exam        Assessment & Plan:

## 2018-08-20 ENCOUNTER — Other Ambulatory Visit: Payer: Self-pay | Admitting: Family Medicine

## 2018-09-01 ENCOUNTER — Other Ambulatory Visit: Payer: Self-pay | Admitting: Neurology

## 2018-09-01 DIAGNOSIS — R569 Unspecified convulsions: Secondary | ICD-10-CM

## 2018-09-04 ENCOUNTER — Other Ambulatory Visit: Payer: Self-pay | Admitting: Nurse Practitioner

## 2018-09-04 DIAGNOSIS — G894 Chronic pain syndrome: Secondary | ICD-10-CM

## 2018-09-07 ENCOUNTER — Ambulatory Visit (HOSPITAL_COMMUNITY)
Admission: EM | Admit: 2018-09-07 | Discharge: 2018-09-07 | Disposition: A | Discharge status: Home / Self Care | Payer: Medicaid Other | Attending: Family Medicine | Admitting: Family Medicine

## 2018-09-07 ENCOUNTER — Encounter (HOSPITAL_COMMUNITY): Payer: Self-pay | Admitting: Emergency Medicine

## 2018-09-07 ENCOUNTER — Other Ambulatory Visit: Payer: Self-pay

## 2018-09-07 ENCOUNTER — Telehealth (HOSPITAL_COMMUNITY): Payer: Self-pay | Admitting: Emergency Medicine

## 2018-09-07 DIAGNOSIS — J449 Chronic obstructive pulmonary disease, unspecified: Secondary | ICD-10-CM

## 2018-09-07 DIAGNOSIS — J4520 Mild intermittent asthma, uncomplicated: Secondary | ICD-10-CM | POA: Diagnosis not present

## 2018-09-07 DIAGNOSIS — L309 Dermatitis, unspecified: Secondary | ICD-10-CM

## 2018-09-07 DIAGNOSIS — E119 Type 2 diabetes mellitus without complications: Secondary | ICD-10-CM

## 2018-09-07 MED ORDER — TRIAMCINOLONE ACETONIDE 0.5 % EX OINT: 1.0000 "application " | TOPICAL_OINTMENT | Freq: Two times a day (BID) | CUTANEOUS | 0 refills | Status: DC

## 2018-09-07 MED ORDER — BUDESONIDE-FORMOTEROL FUMARATE 80-4.5 MCG/ACT IN AERO: 2.0000 | INHALATION_SPRAY | Freq: Two times a day (BID) | RESPIRATORY_TRACT | 1 refills | Status: DC

## 2018-09-07 MED ORDER — NEBULIZER DEVI: 0 refills | Status: DC

## 2018-09-07 MED ORDER — ALBUTEROL SULFATE HFA 108 (90 BASE) MCG/ACT IN AERS: 2.0000 | INHALATION_SPRAY | RESPIRATORY_TRACT | 1 refills | Status: DC | PRN

## 2018-09-07 MED ORDER — INSULIN GLARGINE 100 UNITS/ML SOLOSTAR PEN: 10.0000 [IU] | PEN_INJECTOR | Freq: Every day | SUBCUTANEOUS | 1 refills | Status: DC

## 2018-09-07 MED ORDER — FLUOCINOLONE ACETONIDE 0.01 % EX CREA: TOPICAL_CREAM | Freq: Two times a day (BID) | CUTANEOUS | 1 refills | Status: DC

## 2018-09-07 MED ORDER — TRIAMCINOLONE ACETONIDE 0.1 % EX CREA: 1.0000 "application " | TOPICAL_CREAM | Freq: Two times a day (BID) | CUTANEOUS | 0 refills | Status: DC

## 2018-09-07 NOTE — Telephone Encounter (Signed)
Pt called and stated that her insurance doesn't cover eczema meds; ok to send in new meds per [facility]

## 2018-09-07 NOTE — ED Triage Notes (Signed)
Pt here for medication refill on insulin, inhalers and sts eczema is flaring with itching to hands and feet

## 2018-09-08 ENCOUNTER — Telehealth: Payer: Self-pay

## 2018-09-08 ENCOUNTER — Other Ambulatory Visit: Payer: Self-pay | Admitting: Primary Care

## 2018-09-08 DIAGNOSIS — I10 Essential (primary) hypertension: Secondary | ICD-10-CM

## 2018-09-08 MED ORDER — BLOOD PRESSURE MONITOR DEVI: 1.0000 [IU] | Freq: Three times a day (TID) | 0 refills | Status: DC

## 2018-09-08 NOTE — Telephone Encounter (Signed)
Call placed to [facility] to check on status of BP monitor. Marland Kitchen Spoke to [place] who stated that they do not have a referral for a BP monitor.    Call placed to patient and informed her that her PCP will be informed that there is no record of order for BP monitor.  She said that she spoke to her PCP about the need for the  monitor.   The patient also stated that neurology called her and scheduled the MRI for 09/11/2018. The patient tried to contact DSS to schedule transportation yesterday but it was a holiday. She called today and DSS informed her that they need  approval from the ordering provider to schedule the transportation.  Informed the patient that Dr Manuella Ghazi ordered the MRI and she said that she would call right away

## 2018-09-08 NOTE — Progress Notes (Signed)
Please let Patient know done

## 2018-09-08 NOTE — Telephone Encounter (Signed)
Order for home BP monitor faxed to [facility].   call placed to patient and informed her of same.

## 2018-09-08 NOTE — Telephone Encounter (Signed)
Opened in error

## 2018-09-11 ENCOUNTER — Ambulatory Visit: Admission: RE | Admit: 2018-09-11 | Payer: Medicaid Other | Source: Ambulatory Visit

## 2018-09-15 NOTE — ED Provider Notes (Signed)
[facility]   [id number] 09/07/18 Arrival Time: 6063  ASSESSMENT & PLAN:  1. Mild intermittent asthma, unspecified whether complicated   2. Type 2 diabetes mellitus without complication, with long-term current use of insulin ([place])   3. Chronic obstructive pulmonary disease, unspecified COPD type ([place])   4. Eczema, unspecified type     Meds ordered this encounter  Medications  . albuterol (VENTOLIN HFA) 108 (90 Base) MCG/ACT inhaler    Sig: Inhale 2 puffs into the lungs every 4 (four) hours as needed for wheezing or shortness of breath.    Dispense:  1 Inhaler    Refill:  1  . budesonide-formoterol (SYMBICORT) 80-4.5 MCG/ACT inhaler    Sig: Inhale 2 puffs into the lungs 2 (two) times daily.    Dispense:  6.9 g    Refill:  1  . fluocinolone (VANOS) 0.01 % cream    Sig: Apply topically 2 (two) times daily.    Dispense:  30 g    Refill:  1  . insulin glargine (LANTUS) 100 unit/mL SOPN    Sig: Inject 0.1 mLs (10 Units total) into the skin daily.    Dispense:  15 mL    Refill:  1  . Respiratory Therapy Supplies (NEBULIZER) DEVI    Sig: Nebulizer machine Dispense #1 Diagnosis: COPD    Dispense:  1 each    Refill:  0   Meds refilled as above. No acute exacerbations.  Reviewed expectations re: course of current medical issues. Questions answered. Outlined signs and symptoms indicating need for more acute intervention. Patient verbalized understanding. After Visit Summary given.   SUBJECTIVE: History from: patient. Lori Ford is a 43 y.o. female who presents requesting medication refill. No current concerns.  Current medical problems include: Past Medical History:  Diagnosis Date  . Allergic rhinitis   . Arthritis   . Asthma    as child  . Cervical cancer ([place]) 2006  . Chronic back pain   . Cigarette nicotine dependence with withdrawal   . DVT (deep venous thrombosis) ([place])   . Gallstones    s/p cholecystectomy  . GERD (gastroesophageal reflux  disease)   . HIV (human immunodeficiency virus infection) ([place])   . HTN (hypertension)   . Meningioma ([place])   . Sciatic pain   . Seizure disorder, grand mal ([place])    dx 2005  . Seizures ([place])    due to head trauma as adult    For Reqested Medication Refill: Reason for request:  clinic/provider not available Medications taken before: yes - see home medications   Patient has complete original prescription information: Yes   No CP/SOB. Sugars "usually ok."  ROS: As per HPI. All other systems negative.    OBJECTIVE:  Vitals:   09/07/18 1302  BP: 137/84  Pulse: 87  Resp: 18  Temp: 98.7 F (37.1 C)  TempSrc: Oral  SpO2: 100%    General appearance: alert; no distress Eyes: PERRLA; EOMI; conjunctiva normal HENT: normocephalic; atraumatic; TMs normal; nasal mucosa normal; oral mucosa normal Neck: supple  Lungs: clear to auscultation bilaterally Heart: regular rate and rhythm Abdomen: soft, non-tender; bowel sounds normal; no masses or organomegaly; no guarding or rebound tenderness Back: no CVA tenderness Extremities: no cyanosis or edema; symmetrical with no gross deformities Skin: warm and dry Neurologic: normal gait; normal symmetric reflexes Psychological: alert and cooperative; normal mood and affect    Allergies  Allergen Reactions  . Ibuprofen Shortness Of Breath    wheezing  . Penicillins  Anaphylaxis  . Zofran [Ondansetron] Nausea And Vomiting    Violent and uncontrolled  . Clindamycin/Lincomycin Hives  . Coconut Flavor Hives  . Doxycycline Hyclate     Shortness of breath  . Flagyl [Metronidazole] Other (See Comments)    "it gave me a real bad bacterial infection"  . Latex Other (See Comments)    Rash, wheezing  . Other Swelling    "GRAPE SUBSTANCE"  . Tylenol [Acetaminophen]   . Aspirin Palpitations    wheezing    Social History   Socioeconomic History  . Marital status: Single    Spouse name: Not on file  . Number of children: 1  . Years of  education: college  . Highest education level: Not on file  Occupational History  . Occupation: Disabled  Social Needs  . Financial resource strain: Not on file  . Food insecurity:    Worry: Not on file    Inability: Not on file  . Transportation needs:    Medical: Not on file    Non-medical: Not on file  Tobacco Use  . Smoking status: Current Every Day Smoker    Packs/day: 0.25    Years: 22.00    Pack years: 5.50    Types: Cigarettes  . Smokeless tobacco: Never Used  . Tobacco comment: 07-29-18: Pt indicates she is down to 1 a day  Substance and Sexual Activity  . Alcohol use: Yes    Alcohol/week: 0.0 standard drinks    Comment: Socially / Occasional  . Drug use: No    Types: Marijuana    Comment: not for 30 days  . Sexual activity: Yes    Partners: Male    Birth control/protection: None  Lifestyle  . Physical activity:    Days per week: Not on file    Minutes per session: Not on file  . Stress: Not on file  Relationships  . Social connections:    Talks on phone: Not on file    Gets together: Not on file    Attends religious service: Not on file    Active member of club or organization: Not on file    Attends meetings of clubs or organizations: Not on file    Relationship status: Not on file  . Intimate partner violence:    Fear of current or ex partner: Not on file    Emotionally abused: Not on file    Physically abused: Not on file    Forced sexual activity: Not on file  Other Topics Concern  . Not on file  Social History Narrative   Lives with fiance.  Does not have a PCP.  [address] Jimmye Norman, walk in.  Has insurance.  [facility] [address].     Family History  Problem Relation Age of Onset  . Other Mother        varicose vein  . Asthma Mother   . High blood pressure Mother   . Colon cancer Father 96  . Cancer Other   . Diabetes Other   . High blood pressure Other   . Asthma Other   . Thyroid disease Other   . Breast cancer Maternal Aunt    . Breast cancer Paternal Grandmother   . Deep vein thrombosis Neg Hx   . Pulmonary embolism Neg Hx    Past Surgical History:  Procedure Laterality Date  . BRAIN SURGERY     2013 to remove a meningioma  . CHOLECYSTECTOMY    . IVC FILTER  REMOVAL N/A 10/31/2015   Procedure: IVC Filter Removal;  Surgeon: Adrian Prows, MD;  Location: [facility];  Service: Cardiovascular;  Laterality: N/A;  . PERIPHERAL VASCULAR CATHETERIZATION  10/31/2015   Procedure: IVC/SVC Venography;  Surgeon: Adrian Prows, MD;  Location: [facility];  Service: Cardiovascular;;     Vanessa Kick, MD 09/15/18 [phone]

## 2018-09-21 ENCOUNTER — Ambulatory Visit: Payer: Medicaid Other

## 2018-09-26 ENCOUNTER — Emergency Department (HOSPITAL_COMMUNITY)
Admission: EM | Admit: 2018-09-26 | Discharge: 2018-09-26 | Disposition: A | Discharge status: Home / Self Care | Payer: Medicaid Other | Attending: Emergency Medicine | Admitting: Emergency Medicine

## 2018-09-26 ENCOUNTER — Emergency Department (HOSPITAL_COMMUNITY): Payer: Medicaid Other

## 2018-09-26 ENCOUNTER — Encounter (HOSPITAL_COMMUNITY): Payer: Self-pay | Admitting: Emergency Medicine

## 2018-09-26 ENCOUNTER — Other Ambulatory Visit: Payer: Self-pay

## 2018-09-26 DIAGNOSIS — Z91040 Latex allergy status: Secondary | ICD-10-CM | POA: Insufficient documentation

## 2018-09-26 DIAGNOSIS — Z5320 Procedure and treatment not carried out because of patient's decision for unspecified reasons: Secondary | ICD-10-CM | POA: Diagnosis not present

## 2018-09-26 DIAGNOSIS — G40909 Epilepsy, unspecified, not intractable, without status epilepticus: Secondary | ICD-10-CM | POA: Diagnosis not present

## 2018-09-26 DIAGNOSIS — R569 Unspecified convulsions: Secondary | ICD-10-CM

## 2018-09-26 DIAGNOSIS — B20 Human immunodeficiency virus [HIV] disease: Secondary | ICD-10-CM | POA: Diagnosis not present

## 2018-09-26 DIAGNOSIS — Z8541 Personal history of malignant neoplasm of cervix uteri: Secondary | ICD-10-CM | POA: Diagnosis not present

## 2018-09-26 DIAGNOSIS — J45909 Unspecified asthma, uncomplicated: Secondary | ICD-10-CM | POA: Insufficient documentation

## 2018-09-26 DIAGNOSIS — I10 Essential (primary) hypertension: Secondary | ICD-10-CM | POA: Diagnosis not present

## 2018-09-26 DIAGNOSIS — Z794 Long term (current) use of insulin: Secondary | ICD-10-CM | POA: Diagnosis not present

## 2018-09-26 DIAGNOSIS — F17210 Nicotine dependence, cigarettes, uncomplicated: Secondary | ICD-10-CM | POA: Insufficient documentation

## 2018-09-26 DIAGNOSIS — Z79899 Other long term (current) drug therapy: Secondary | ICD-10-CM | POA: Insufficient documentation

## 2018-09-26 LAB — URINALYSIS, ROUTINE W REFLEX MICROSCOPIC
Bacteria, UA: NONE SEEN
Bilirubin Urine: NEGATIVE
Glucose, UA: NEGATIVE mg/dL
Ketones, ur: NEGATIVE mg/dL
Leukocytes,Ua: NEGATIVE
Nitrite: NEGATIVE
Protein, ur: NEGATIVE mg/dL
Specific Gravity, Urine: 1.021 (ref 1.005–1.030)
pH: 7 (ref 5.0–8.0)

## 2018-09-26 LAB — COMPREHENSIVE METABOLIC PANEL
ALT: 11 U/L (ref 0–44)
AST: 16 U/L (ref 15–41)
Albumin: 3.9 g/dL (ref 3.5–5.0)
Alkaline Phosphatase: 67 U/L (ref 38–126)
Anion gap: 9 (ref 5–15)
BUN: 6 mg/dL (ref 6–20)
CO2: 25 mmol/L (ref 22–32)
Calcium: 9.8 mg/dL (ref 8.9–10.3)
Chloride: 102 mmol/L (ref 98–111)
Creatinine, Ser: 0.97 mg/dL (ref 0.44–1.00)
GFR calc Af Amer: >60 mL/min (ref >60)
GFR calc non Af Amer: >60 mL/min (ref >60)
Glucose, Bld: 101 mg/dL — ABNORMAL HIGH (ref 70–99)
Potassium: 4.1 mmol/L (ref 3.5–5.1)
Sodium: 136 mmol/L (ref 135–145)
Total Bilirubin: 0.5 mg/dL (ref 0.3–1.2)
Total Protein: 7.5 g/dL (ref 6.5–8.1)

## 2018-09-26 LAB — CBC WITH DIFFERENTIAL/PLATELET
Abs Immature Granulocytes: 0.04 10*3/uL (ref 0.00–0.07)
Basophils Absolute: 0 10*3/uL (ref 0.0–0.1)
Basophils Relative: 0 %
Eosinophils Absolute: 0 10*3/uL (ref 0.0–0.5)
Eosinophils Relative: 1 %
HCT: 42.2 % (ref 36.0–46.0)
Hemoglobin: 14.2 g/dL (ref 12.0–15.0)
Immature Granulocytes: 1 %
Lymphocytes Relative: 42 %
Lymphs Abs: 3.1 10*3/uL (ref 0.7–4.0)
MCH: 30.7 pg (ref 26.0–34.0)
MCHC: 33.6 g/dL (ref 30.0–36.0)
MCV: 91.3 fL (ref 80.0–100.0)
Monocytes Absolute: 0.5 10*3/uL (ref 0.1–1.0)
Monocytes Relative: 7 %
Neutro Abs: 3.7 10*3/uL (ref 1.7–7.7)
Neutrophils Relative %: 49 %
Platelets: 255 10*3/uL (ref 150–400)
RBC: 4.62 MIL/uL (ref 3.87–5.11)
RDW: 13.6 % (ref 11.5–15.5)
WBC: 7.4 10*3/uL (ref 4.0–10.5)
nRBC: 0 % (ref 0.0–0.2)

## 2018-09-26 LAB — RAPID URINE DRUG SCREEN, HOSP PERFORMED
Amphetamines: NOT DETECTED
Barbiturates: POSITIVE — AB
Benzodiazepines: NOT DETECTED
Cocaine: NOT DETECTED
Opiates: NOT DETECTED
Tetrahydrocannabinol: POSITIVE — AB

## 2018-09-26 LAB — VALPROIC ACID LEVEL: Valproic Acid Lvl: <10 ug/mL — ABNORMAL LOW (ref 50.0–100.0)

## 2018-09-26 LAB — PHENYTOIN LEVEL, TOTAL: Phenytoin Lvl: <2.5 ug/mL — ABNORMAL LOW (ref 10.0–20.0)

## 2018-09-26 LAB — CBG MONITORING, ED: Glucose-Capillary: 97 mg/dL (ref 70–99)

## 2018-09-26 MED ORDER — PHENYTOIN SODIUM EXTENDED 100 MG PO CAPS: 300.0000 mg | ORAL_CAPSULE | Freq: Once | ORAL | Status: DC

## 2018-09-26 MED ORDER — OXYCODONE HCL 5 MG PO TABS: 5.0000 mg | ORAL_TABLET | Freq: Once | ORAL | Status: DC

## 2018-09-26 NOTE — ED Notes (Addendum)
Pt cussing at staff saying racist slurs and advising she wants to leave. Pt refusing to get into bed.

## 2018-09-26 NOTE — ED Provider Notes (Signed)
[facility] EMERGENCY DEPARTMENT Provider Note   CSN: [id number] Arrival date & time: 09/26/18  1143     History   Chief Complaint Chief Complaint  Patient presents with  . stroke like sx  . Seizures    HPI Lori Ford is a 43 y.o. female.     The history is provided by the patient. No language interpreter was used.  Seizures Seizure activity on arrival: no   Initial focality:  Left-sided Episode characteristics: no abnormal movements   Postictal symptoms: no confusion   Return to baseline: yes   Severity:  Mild Timing:  Once Progression:  Worsening Context: change in medication   Context: not alcohol withdrawal   Recent head injury:  No recent head injuries PTA treatment:  None History of seizures: yes   Pt reports it she has has 2 seizures today.  Pt reports her Neurologist just changed her medications. Pt reports her left arm and left leg have felt weak since yesterday Pt reports her face feels tingly   Past Medical History:  Diagnosis Date  . Allergic rhinitis   . Arthritis   . Asthma    as child  . Cervical cancer ([place]) 2006  . Chronic back pain   . Cigarette nicotine dependence with withdrawal   . DVT (deep venous thrombosis) ([place])   . Gallstones    s/p cholecystectomy  . GERD (gastroesophageal reflux disease)   . HIV (human immunodeficiency virus infection) ([place])   . HTN (hypertension)   . Meningioma ([place])   . Sciatic pain   . Seizure disorder, grand mal ([place])    dx 2005  . Seizures ([place])    due to head trauma as adult    Patient Active Problem List   Diagnosis Date Noted  . Healthcare maintenance 04/17/2018  . 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 Chest pain 12/10/2014  . Chest wall pain 12/10/2014  . Left leg pain 12/10/2014  . Left knee pain 12/10/2014  . Nausea vomiting and diarrhea 12/10/2014  . Seizures ([place]) 12/10/2014  . Seizure ([place]) 12/10/2014  . Hyperkalemia 12/10/2014  . HIV disease ([place]) 05/11/2014  .  Major depression, recurrent, chronic ([place]) 05/11/2014  . Morbid obesity ([place]) 08/10/2013  . DVT, popliteal, acute, left 10/06/2012  . 41mm Pulmonary nodule, right upper lobe.  repeat CT chest May 2015. 10/06/2012  . Asthma 10/06/2012  . Hypertension 10/06/2012  . GERD (gastroesophageal reflux disease) 10/06/2012  . Cigarette nicotine dependence with withdrawal 10/06/2012  . Seizure disorder, grand mal ([place]) 09/25/2012    Past Surgical History:  Procedure Laterality Date  . BRAIN SURGERY     2013 to remove a meningioma  . CHOLECYSTECTOMY    . IVC FILTER REMOVAL N/A 10/31/2015   Procedure: IVC Filter Removal;  Surgeon: Adrian Prows, MD;  Location: [facility];  Service: Cardiovascular;  Laterality: N/A;  . PERIPHERAL VASCULAR CATHETERIZATION  10/31/2015   Procedure: IVC/SVC Venography;  Surgeon: Adrian Prows, MD;  Location: [facility];  Service: Cardiovascular;;     OB History    Gravida  3   Para  1   Term  1   Preterm      AB  2   Living  1     SAB      TAB  2   Ectopic      Multiple      Live Births  Home Medications    Prior to Admission medications   Medication Sig Start Date End Date Taking? Authorizing Provider  albuterol (VENTOLIN HFA) 108 (90 Base) MCG/ACT inhaler Inhale 2 puffs into the lungs every 4 (four) hours as needed for wheezing or shortness of breath. 09/07/18  Yes Hagler, Aaron Edelman, MD  amLODipine (NORVASC) 10 MG tablet Take 10 mg by mouth daily.   Yes [provider]  bictegravir-emtricitabine-tenofovir AF (BIKTARVY) 50-200-25 MG TABS tablet Take 1 tablet by mouth daily. 04/20/18  Yes Maudie Flakes, MD  budesonide-formoterol [facility]) 80-4.5 MCG/ACT inhaler Inhale 2 puffs into the lungs 2 (two) times daily. 09/07/18  Yes Hagler, Aaron Edelman, MD  butalbital-acetaminophen-caffeine (FIORICET) [phone] MG tablet Take 1 tablet by mouth daily as needed for headache.   Yes [provider]  clonazePAM (KLONOPIN) 1 MG  tablet Take 1 mg by mouth 2 (two) times a day. 08/24/18  Yes [provider]  cyclobenzaprine (FLEXERIL) 10 MG tablet Take 1 tablet (10 mg total) by mouth 2 (two) times daily as needed for muscle spasms. 04/20/18  Yes Maudie Flakes, MD  divalproex (DEPAKOTE ER) 500 MG 24 hr tablet Take 1,000 mg by mouth 2 (two) times daily.    Yes [provider]  folic acid (FOLVITE) 1 MG tablet Take 1 mg by mouth daily.   Yes [provider]  gabapentin (NEURONTIN) 400 MG capsule Take 2 capsules (800 mg total) by mouth 3 (three) times daily. 04/20/18  Yes Maudie Flakes, MD  hydrochlorothiazide (HYDRODIURIL) 25 MG tablet Take 1 tablet (25 mg total) by mouth daily. 04/20/18  Yes Maudie Flakes, MD  insulin glargine (LANTUS) 100 unit/mL SOPN Inject 0.1 mLs (10 Units total) into the skin daily. 09/07/18  Yes Hagler, Aaron Edelman, MD  levETIRAcetam (KEPPRA) 500 MG tablet Take 500 mg by mouth See admin instructions. Taking one tablet in the Am, 1 tablet in the afternoon, and 1.5 tablets (750mg ) at night. 09/14/18  Yes [provider]  liraglutide (VICTOZA) 18 MG/3ML SOPN Inject 0.2 mLs (1.2 mg total) into the skin daily at 2 PM for 30 days. 05/11/18 09/26/18 Yes Gildardo Pounds, NP  loratadine (CLARITIN) 10 MG tablet Take 1 tablet (10 mg total) by mouth daily. 04/20/18  Yes Maudie Flakes, MD  mirtazapine (REMERON) 15 MG tablet Take 15 mg by mouth at bedtime. 07/04/16  Yes [provider]  omeprazole (PRILOSEC) 40 MG capsule Take 1 capsule (40 mg total) by mouth 2 (two) times daily. 04/20/18  Yes Maudie Flakes, MD  Oxycodone HCl 10 MG TABS Take 10 mg by mouth 3 (three) times daily as needed for pain. LF [place] on 07-06-18# 90 07/06/18  Yes [provider]  PARoxetine (PAXIL) 40 MG tablet Take 1 tablet (40 mg total) by mouth daily. 04/20/18  Yes Maudie Flakes, MD  PATADAY 0.2 % SOLN Place 1 drop into both eyes daily. 07/31/18  Yes [provider]  phenytoin (DILANTIN) 200 MG  ER capsule Take 400 mg by mouth 3 (three) times daily.    Yes [provider]  propranolol (INDERAL) 20 MG tablet Take 20 mg by mouth daily.   Yes [provider]  sitaGLIPtin (JANUVIA) 50 MG tablet Take 1 tablet (50 mg total) by mouth daily. 04/20/18  Yes Maudie Flakes, MD  sodium chloride (OCEAN) 0.65 % nasal spray Place 1 spray into the nose 2 (two) times daily as needed for congestion.    Yes [provider]  triamcinolone  cream (KENALOG) 0.1 % Apply 1 application topically 2 (two) times daily. 09/07/18  Yes Hagler, Aaron Edelman, MD  triamcinolone ointment (KENALOG) 0.5 % Apply 1 application topically 2 (two) times daily. 09/07/18  Yes Hagler, Aaron Edelman, MD  Vitamin D, Ergocalciferol, (DRISDOL) 50000 units CAPS capsule Take 50,000 Units by mouth every 7 (seven) days. THURSDAYS 07/04/16  Yes [provider]  Blood Pressure Monitor DEVI 1 Units by Does not apply route 3 (three) times daily. 09/08/18   Kerin Perna, NP  fluocinolone (VANOS) 0.01 % cream Apply topically 2 (two) times daily. Patient not taking: Reported on 09/26/2018 09/07/18   Vanessa Kick, MD  glucose blood (IGLUCOSE TEST STRIPS) test strip Use as instructed 08/07/18   Charlott Rakes, MD  Lancets (ACCU-CHEK SOFT TOUCH) lancets Use as instructed 05/01/18   Wynona Luna, MD  levETIRAcetam (KEPPRA) 500 MG tablet Take 1 tablet (500 mg total) by mouth 2 (two) times daily for 14 days. After 2 weeks increase to 2 tablets twice per day by mouth. 05/18/18 08/05/18  Gildardo Pounds, NP  promethazine-dextromethorphan (PROMETHAZINE-DM) 6.25-15 MG/5ML syrup Take 5 mLs by mouth 4 (four) times daily as needed for cough. Patient not taking: Reported on 09/26/2018 04/20/18   Maudie Flakes, MD  Respiratory Therapy Supplies (NEBULIZER) DEVI Nebulizer machine Dispense #1 Diagnosis: COPD 09/07/18   Vanessa Kick, MD    Family History Family History  Problem Relation Age of Onset  . Other Mother        varicose vein   . Asthma Mother   . High blood pressure Mother   . Colon cancer Father 24  . Cancer Other   . Diabetes Other   . High blood pressure Other   . Asthma Other   . Thyroid disease Other   . Breast cancer Maternal Aunt   . Breast cancer Paternal Grandmother   . Deep vein thrombosis Neg Hx   . Pulmonary embolism Neg Hx     Social History Social History   Tobacco Use  . Smoking status: Current Every Day Smoker    Packs/day: 0.25    Years: 22.00    Pack years: 5.50    Types: Cigarettes  . Smokeless tobacco: Never Used  . Tobacco comment: 07-29-18: Pt indicates she is down to 1 a day  Substance Use Topics  . Alcohol use: Yes    Alcohol/week: 0.0 standard drinks    Comment: Socially / Occasional  . Drug use: No    Types: Marijuana    Comment: not for 30 days     Allergies   Ibuprofen, Penicillins, Zofran [ondansetron], Bee venom, Clindamycin/lincomycin, Coconut flavor, Doxycycline hyclate, Flagyl [metronidazole], Latex, Other, Tylenol [acetaminophen], Aspirin, and Eggs or egg-derived products   Review of Systems Review of Systems  Constitutional: Negative for fever.  Respiratory: Negative for cough.   Musculoskeletal: Negative for gait problem and joint swelling.  Neurological: Positive for seizures.  All other systems reviewed and are negative.    Physical Exam Updated Vital Signs BP 122/85   Pulse 86   Temp 99 F (37.2 C) (Oral)   Resp (!) 24   SpO2 97%   Physical Exam Vitals signs and nursing note reviewed.  Constitutional:      Appearance: She is well-developed.  HENT:     Head: Normocephalic.     Left Ear: Tympanic membrane normal.     Nose: Nose normal.     Mouth/Throat:     Mouth: Mucous membranes are moist.  Eyes:  Extraocular Movements: Extraocular movements intact.     Pupils: Pupils are equal, round, and reactive to light.  Neck:     Musculoskeletal: Normal range of motion.  Cardiovascular:     Rate and Rhythm: Normal rate and regular  rhythm.  Pulmonary:     Effort: Pulmonary effort is normal.  Abdominal:     General: Abdomen is flat. There is no distension.  Musculoskeletal: Normal range of motion.  Skin:    General: Skin is warm.     Capillary Refill: Capillary refill takes less than 2 seconds.  Neurological:     Mental Status: She is alert and oriented to person, place, and time.     Comments: Decreased left grip,  No facial weakness,    Psychiatric:        Mood and Affect: Mood normal.      ED Treatments / Results  Labs (all labs ordered are listed, but only abnormal results are displayed) Labs Reviewed  COMPREHENSIVE METABOLIC PANEL - Abnormal; Notable for the following components:      Result Value   Glucose, Bld 101 (*)    All other components within normal limits  URINALYSIS, ROUTINE W REFLEX MICROSCOPIC - Abnormal; Notable for the following components:   APPearance HAZY (*)    Hgb urine dipstick SMALL (*)    All other components within normal limits  RAPID URINE DRUG SCREEN, HOSP PERFORMED - Abnormal; Notable for the following components:   Tetrahydrocannabinol POSITIVE (*)    Barbiturates POSITIVE (*)    All other components within normal limits  PHENYTOIN LEVEL, TOTAL - Abnormal; Notable for the following components:   Phenytoin Lvl <2.5 (*)    All other components within normal limits  VALPROIC ACID LEVEL - Abnormal; Notable for the following components:   Valproic Acid Lvl <10 (*)    All other components within normal limits  CBC WITH DIFFERENTIAL/PLATELET  CBG MONITORING, ED    EKG EKG Interpretation  Date/Time:  Saturday September 26 2018 11:54:48 EDT Ventricular Rate:  83 PR Interval:    QRS Duration: 87 QT Interval:  354 QTC Calculation: 416 R Axis:   63 Text Interpretation:  Sinus rhythm Abnormal R-wave progression, early transition Baseline wander in lead(s) II aVF V5 V6 No significant change was found Confirmed by Jola Schmidt [id number]) on 09/26/2018 12:04:01 PM   Radiology Ct  Head Wo Contrast  Result Date: 09/26/2018 CLINICAL DATA:  Seizure last night. Difficulty with speech, facial weakness, and left leg weakness. History of seizures. EXAM: CT HEAD WITHOUT CONTRAST TECHNIQUE: Contiguous axial images were obtained from the base of the skull through the vertex without intravenous contrast. COMPARISON:  June 21, 2015 FINDINGS: Brain: No subdural, epidural, or subarachnoid hemorrhage. No mass effect or midline shift. Cerebellum, brainstem, and basal cisterns are normal. Ventricles and sulci are unremarkable. No acute cortical ischemia or infarct. Vascular: No hyperdense vessel or unexpected calcification. Skull: Previous left craniotomy.  No acute abnormalities. Sinuses/Orbits: No acute finding. Other: None. IMPRESSION: 1. No acute intracranial abnormalities. Electronically Signed   By: Dorise Bullion III M.D   On: 09/26/2018 12:59    Procedures Procedures (including critical care time)  Medications Ordered in ED Medications  phenytoin (DILANTIN) ER capsule 300 mg (has no administration in time range)  oxyCODONE (Oxy IR/ROXICODONE) immediate release tablet 5 mg (has no administration in time range)     Initial Impression / Assessment and Plan / ED Course  I have reviewed the triage vital signs and the nursing  notes.  Pertinent labs & imaging results that were available during my care of the patient were reviewed by me and considered in my medical decision making (see chart for details).        MDM  Ct scan is normal  Dilantin level less than 2.5.  I discussed MRi with pt.  Pt agrees to plan.  I will given pt Dilantin loading dosage.  Pt complains of muscle aches.  Oxycodone for pain.   RN reports pt decided not to stay.  Pt signed out AMA and did not receive MRi.    Final Clinical Impressions(s) / ED Diagnoses   Final diagnoses:  Seizure [facility])  Seizure disorder [facility])    ED Discharge Orders    None    An After Visit Summary was printed and given to the  patient.    Sidney Ace 09/26/18 1924    Jola Schmidt, MD 09/27/18 1108

## 2018-09-26 NOTE — ED Notes (Signed)
Patient transported to CT 

## 2018-09-26 NOTE — ED Triage Notes (Signed)
Pt states she started retaking her meds yesterday and around 1730 yesterday she started having seizures and noticed slurred speech and muscle soreness. Pt also states her left leg drags when she walks. Pt states her left leg is

## 2018-09-29 ENCOUNTER — Ambulatory Visit: Admission: RE | Admit: 2018-09-29 | Payer: Medicaid Other | Source: Ambulatory Visit

## 2018-10-02 ENCOUNTER — Other Ambulatory Visit: Payer: Self-pay

## 2018-10-02 ENCOUNTER — Ambulatory Visit (HOSPITAL_COMMUNITY)
Admission: EM | Admit: 2018-10-02 | Discharge: 2018-10-02 | Disposition: A | Discharge status: Home / Self Care | Payer: Medicaid Other | Attending: Internal Medicine | Admitting: Internal Medicine

## 2018-10-02 ENCOUNTER — Encounter (HOSPITAL_COMMUNITY): Payer: Self-pay | Admitting: *Deleted

## 2018-10-02 ENCOUNTER — Other Ambulatory Visit: Payer: Self-pay | Admitting: Nurse Practitioner

## 2018-10-02 DIAGNOSIS — E1165 Type 2 diabetes mellitus with hyperglycemia: Secondary | ICD-10-CM

## 2018-10-02 DIAGNOSIS — K047 Periapical abscess without sinus: Secondary | ICD-10-CM

## 2018-10-02 DIAGNOSIS — IMO0002 Reserved for concepts with insufficient information to code with codable children: Secondary | ICD-10-CM

## 2018-10-02 HISTORY — DX: Fibromyalgia: M79.7

## 2018-10-02 HISTORY — DX: Type 2 diabetes mellitus without complications: E11.9

## 2018-10-02 MED ORDER — LEVOFLOXACIN 500 MG PO TABS: 500.0000 mg | ORAL_TABLET | Freq: Every day | ORAL | 0 refills | Status: DC

## 2018-10-02 NOTE — ED Triage Notes (Signed)
C/O right upper tooth pain x 2 days.  Has had tooth cracked "for a while".  Denies fevers.
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
## 2018-10-16 ENCOUNTER — Other Ambulatory Visit: Payer: Self-pay | Admitting: Primary Care

## 2018-10-16 ENCOUNTER — Other Ambulatory Visit: Payer: Self-pay | Admitting: Nurse Practitioner

## 2018-10-16 DIAGNOSIS — IMO0002 Reserved for concepts with insufficient information to code with codable children: Secondary | ICD-10-CM

## 2018-10-16 DIAGNOSIS — E1165 Type 2 diabetes mellitus with hyperglycemia: Secondary | ICD-10-CM

## 2018-10-19 NOTE — Telephone Encounter (Signed)
FWD to PCP. Tempestt S Roberts, CMA  

## 2018-10-20 ENCOUNTER — Other Ambulatory Visit (INDEPENDENT_AMBULATORY_CARE_PROVIDER_SITE_OTHER): Payer: Self-pay | Admitting: Primary Care

## 2018-10-20 MED ORDER — INSULIN GLARGINE 100 UNITS/ML SOLOSTAR PEN: 10.0000 [IU] | PEN_INJECTOR | Freq: Every day | SUBCUTANEOUS | 3 refills | Status: DC

## 2018-10-20 MED ORDER — BUDESONIDE-FORMOTEROL FUMARATE 80-4.5 MCG/ACT IN AERO: 2.0000 | INHALATION_SPRAY | Freq: Two times a day (BID) | RESPIRATORY_TRACT | 3 refills | Status: DC

## 2018-10-29 ENCOUNTER — Other Ambulatory Visit (INDEPENDENT_AMBULATORY_CARE_PROVIDER_SITE_OTHER): Payer: Self-pay | Admitting: Primary Care

## 2018-10-29 ENCOUNTER — Ambulatory Visit: Admission: RE | Admit: 2018-10-29 | Payer: Medicaid Other | Source: Ambulatory Visit

## 2018-11-16 ENCOUNTER — Encounter (HOSPITAL_COMMUNITY): Payer: Self-pay | Admitting: Emergency Medicine

## 2018-11-16 ENCOUNTER — Ambulatory Visit (HOSPITAL_COMMUNITY)
Admission: EM | Admit: 2018-11-16 | Discharge: 2018-11-16 | Disposition: A | Discharge status: Other Acute Inpatient Hospital | Payer: Medicaid Other

## 2018-11-16 ENCOUNTER — Other Ambulatory Visit: Payer: Self-pay

## 2018-11-16 ENCOUNTER — Emergency Department (HOSPITAL_COMMUNITY)
Admission: EM | Admit: 2018-11-16 | Discharge: 2018-11-16 | Disposition: A | Discharge status: Home / Self Care | Payer: Medicaid Other | Attending: Emergency Medicine | Admitting: Emergency Medicine

## 2018-11-16 DIAGNOSIS — R109 Unspecified abdominal pain: Secondary | ICD-10-CM

## 2018-11-16 DIAGNOSIS — I10 Essential (primary) hypertension: Secondary | ICD-10-CM | POA: Insufficient documentation

## 2018-11-16 DIAGNOSIS — Z79899 Other long term (current) drug therapy: Secondary | ICD-10-CM | POA: Diagnosis not present

## 2018-11-16 DIAGNOSIS — N72 Inflammatory disease of cervix uteri: Secondary | ICD-10-CM | POA: Insufficient documentation

## 2018-11-16 DIAGNOSIS — E119 Type 2 diabetes mellitus without complications: Secondary | ICD-10-CM | POA: Insufficient documentation

## 2018-11-16 DIAGNOSIS — J45901 Unspecified asthma with (acute) exacerbation: Secondary | ICD-10-CM | POA: Insufficient documentation

## 2018-11-16 DIAGNOSIS — R1031 Right lower quadrant pain: Secondary | ICD-10-CM | POA: Diagnosis present

## 2018-11-16 DIAGNOSIS — Z794 Long term (current) use of insulin: Secondary | ICD-10-CM | POA: Diagnosis not present

## 2018-11-16 DIAGNOSIS — R102 Pelvic and perineal pain: Secondary | ICD-10-CM

## 2018-11-16 DIAGNOSIS — F17210 Nicotine dependence, cigarettes, uncomplicated: Secondary | ICD-10-CM | POA: Diagnosis not present

## 2018-11-16 LAB — URINALYSIS, ROUTINE W REFLEX MICROSCOPIC
Bilirubin Urine: NEGATIVE
Glucose, UA: NEGATIVE mg/dL
Ketones, ur: NEGATIVE mg/dL
Leukocytes,Ua: NEGATIVE
Nitrite: NEGATIVE
Protein, ur: NEGATIVE mg/dL
Specific Gravity, Urine: 1.006 (ref 1.005–1.030)
pH: 6 (ref 5.0–8.0)

## 2018-11-16 LAB — POC URINE PREG, ED: Preg Test, Ur: NEGATIVE

## 2018-11-16 LAB — COMPREHENSIVE METABOLIC PANEL
ALT: 16 U/L (ref 0–44)
AST: 21 U/L (ref 15–41)
Albumin: 4.3 g/dL (ref 3.5–5.0)
Alkaline Phosphatase: 69 U/L (ref 38–126)
Anion gap: 10 (ref 5–15)
BUN: 8 mg/dL (ref 6–20)
CO2: 21 mmol/L — ABNORMAL LOW (ref 22–32)
Calcium: 9.3 mg/dL (ref 8.9–10.3)
Chloride: 106 mmol/L (ref 98–111)
Creatinine, Ser: 1.02 mg/dL — ABNORMAL HIGH (ref 0.44–1.00)
GFR calc Af Amer: >60 mL/min (ref >60)
GFR calc non Af Amer: >60 mL/min (ref >60)
Glucose, Bld: 86 mg/dL (ref 70–99)
Potassium: 4.4 mmol/L (ref 3.5–5.1)
Sodium: 137 mmol/L (ref 135–145)
Total Bilirubin: 0.8 mg/dL (ref 0.3–1.2)
Total Protein: 7.8 g/dL (ref 6.5–8.1)

## 2018-11-16 LAB — CBC
HCT: 44.2 % (ref 36.0–46.0)
Hemoglobin: 14.4 g/dL (ref 12.0–15.0)
MCH: 30.4 pg (ref 26.0–34.0)
MCHC: 32.6 g/dL (ref 30.0–36.0)
MCV: 93.2 fL (ref 80.0–100.0)
Platelets: 243 10*3/uL (ref 150–400)
RBC: 4.74 MIL/uL (ref 3.87–5.11)
RDW: 13.5 % (ref 11.5–15.5)
WBC: 5.7 10*3/uL (ref 4.0–10.5)
nRBC: 0 % (ref 0.0–0.2)

## 2018-11-16 LAB — WET PREP, GENITAL
Sperm: NONE SEEN
Trich, Wet Prep: NONE SEEN
Yeast Wet Prep HPF POC: NONE SEEN

## 2018-11-16 LAB — CBG MONITORING, ED: Glucose-Capillary: 77 mg/dL (ref 70–99)

## 2018-11-16 LAB — LIPASE, BLOOD: Lipase: 29 U/L (ref 11–51)

## 2018-11-16 MED ORDER — PROCHLORPERAZINE EDISYLATE 10 MG/2ML IJ SOLN
10.0000 mg | Freq: Once | INTRAMUSCULAR | Status: AC
  Administered 2018-11-16: 10 mg via INTRAVENOUS
  Filled 2018-11-16: qty 2

## 2018-11-16 MED ORDER — SODIUM CHLORIDE 0.9 % IV SOLN
1.0000 g | Freq: Once | INTRAVENOUS | Status: AC
  Administered 2018-11-16: 1 g via INTRAVENOUS
  Filled 2018-11-16: qty 10

## 2018-11-16 MED ORDER — DIPHENHYDRAMINE HCL 50 MG/ML IJ SOLN
25.0000 mg | Freq: Once | INTRAMUSCULAR | Status: AC
  Administered 2018-11-16: 11:00:00 25 mg via INTRAVENOUS
  Filled 2018-11-16: qty 1

## 2018-11-16 MED ORDER — AZITHROMYCIN 250 MG PO TABS
1000.0000 mg | ORAL_TABLET | Freq: Once | ORAL | Status: AC
  Administered 2018-11-16: 1000 mg via ORAL
  Filled 2018-11-16: qty 4

## 2018-11-16 MED ORDER — MORPHINE SULFATE (PF) 4 MG/ML IV SOLN
4.0000 mg | Freq: Once | INTRAVENOUS | Status: AC
  Administered 2018-11-16: 4 mg via INTRAVENOUS
  Filled 2018-11-16: qty 1

## 2018-11-16 MED ORDER — AZITHROMYCIN 250 MG PO TABS: 250.0000 mg | ORAL_TABLET | Freq: Once | ORAL | 0 refills | Status: AC

## 2018-11-16 MED ORDER — SODIUM CHLORIDE 0.9 % IV BOLUS
1000.0000 mL | Freq: Once | INTRAVENOUS | Status: AC
  Administered 2018-11-16: 1000 mL via INTRAVENOUS
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
## 2018-11-16 NOTE — ED Notes (Signed)
Pt verbalized understanding of discharge paperwork and follow-up care.  °

## 2018-11-16 NOTE — Discharge Instructions (Signed)
We will treat you for a possible infection in your pelvis.  Please follow-up with your family doctor as the symptoms been going on for over a month.  They may need to refer you to an OB/GYN or a different specialist based on their evaluation.  You need to get the antibiotics filled and take it 1 week from today.  Please return for worsening abdominal pain fever or inability to eat or drink.

## 2018-11-16 NOTE — ED Triage Notes (Signed)
Pt sent from UC with reports of mid lower abd pain and back pain that goes down her arms and legs for about 2 weeks. States she has had to take nausea medicine to be able to eat. Pt tearful.

## 2018-11-16 NOTE — Discharge Instructions (Addendum)
Go to the ER for further evaluation

## 2018-11-16 NOTE — ED Triage Notes (Signed)
Pt c/o nausea, lower abdominal pain, center back pain traveling down legs x3 weeks. Pt c/o poor appetite, states she usually takes nausea medicine already due her GERD.

## 2018-11-17 LAB — GC/CHLAMYDIA PROBE AMP (~~LOC~~) NOT AT ARMC
Chlamydia: NEGATIVE
Neisseria Gonorrhea: NEGATIVE

## 2018-11-21 ENCOUNTER — Emergency Department (HOSPITAL_COMMUNITY)
Admission: EM | Admit: 2018-11-21 | Discharge: 2018-11-21 | Disposition: A | Discharge status: Home / Self Care | Payer: Medicaid Other | Attending: Emergency Medicine | Admitting: Emergency Medicine

## 2018-11-21 ENCOUNTER — Ambulatory Visit (HOSPITAL_COMMUNITY)
Admission: EM | Admit: 2018-11-21 | Discharge: 2018-11-21 | Disposition: A | Discharge status: Home / Self Care | Payer: Medicaid Other | Attending: Family Medicine | Admitting: Family Medicine

## 2018-11-21 ENCOUNTER — Other Ambulatory Visit: Payer: Self-pay

## 2018-11-21 ENCOUNTER — Encounter (HOSPITAL_COMMUNITY): Payer: Self-pay | Admitting: Emergency Medicine

## 2018-11-21 ENCOUNTER — Encounter (HOSPITAL_COMMUNITY): Payer: Self-pay | Admitting: *Deleted

## 2018-11-21 DIAGNOSIS — G8929 Other chronic pain: Secondary | ICD-10-CM | POA: Diagnosis not present

## 2018-11-21 DIAGNOSIS — Z8541 Personal history of malignant neoplasm of cervix uteri: Secondary | ICD-10-CM | POA: Insufficient documentation

## 2018-11-21 DIAGNOSIS — M797 Fibromyalgia: Secondary | ICD-10-CM | POA: Diagnosis not present

## 2018-11-21 DIAGNOSIS — Z91040 Latex allergy status: Secondary | ICD-10-CM | POA: Diagnosis not present

## 2018-11-21 DIAGNOSIS — Z880 Allergy status to penicillin: Secondary | ICD-10-CM | POA: Insufficient documentation

## 2018-11-21 DIAGNOSIS — M549 Dorsalgia, unspecified: Secondary | ICD-10-CM | POA: Diagnosis not present

## 2018-11-21 DIAGNOSIS — I10 Essential (primary) hypertension: Secondary | ICD-10-CM | POA: Insufficient documentation

## 2018-11-21 DIAGNOSIS — E119 Type 2 diabetes mellitus without complications: Secondary | ICD-10-CM | POA: Diagnosis not present

## 2018-11-21 DIAGNOSIS — Z886 Allergy status to analgesic agent status: Secondary | ICD-10-CM | POA: Insufficient documentation

## 2018-11-21 DIAGNOSIS — Z79899 Other long term (current) drug therapy: Secondary | ICD-10-CM | POA: Insufficient documentation

## 2018-11-21 DIAGNOSIS — Z760 Encounter for issue of repeat prescription: Secondary | ICD-10-CM | POA: Insufficient documentation

## 2018-11-21 DIAGNOSIS — Z20828 Contact with and (suspected) exposure to other viral communicable diseases: Secondary | ICD-10-CM | POA: Diagnosis not present

## 2018-11-21 DIAGNOSIS — Z21 Asymptomatic human immunodeficiency virus [HIV] infection status: Secondary | ICD-10-CM | POA: Diagnosis not present

## 2018-11-21 DIAGNOSIS — Z7951 Long term (current) use of inhaled steroids: Secondary | ICD-10-CM | POA: Diagnosis not present

## 2018-11-21 DIAGNOSIS — K219 Gastro-esophageal reflux disease without esophagitis: Secondary | ICD-10-CM | POA: Diagnosis not present

## 2018-11-21 DIAGNOSIS — Z794 Long term (current) use of insulin: Secondary | ICD-10-CM | POA: Diagnosis not present

## 2018-11-21 DIAGNOSIS — R1010 Upper abdominal pain, unspecified: Secondary | ICD-10-CM | POA: Diagnosis not present

## 2018-11-21 DIAGNOSIS — J45909 Unspecified asthma, uncomplicated: Secondary | ICD-10-CM | POA: Diagnosis not present

## 2018-11-21 DIAGNOSIS — Z86718 Personal history of other venous thrombosis and embolism: Secondary | ICD-10-CM | POA: Diagnosis not present

## 2018-11-21 DIAGNOSIS — M545 Low back pain, unspecified: Secondary | ICD-10-CM

## 2018-11-21 DIAGNOSIS — F17210 Nicotine dependence, cigarettes, uncomplicated: Secondary | ICD-10-CM | POA: Insufficient documentation

## 2018-11-21 DIAGNOSIS — R030 Elevated blood-pressure reading, without diagnosis of hypertension: Secondary | ICD-10-CM | POA: Insufficient documentation

## 2018-11-21 DIAGNOSIS — Z888 Allergy status to other drugs, medicaments and biological substances status: Secondary | ICD-10-CM | POA: Insufficient documentation

## 2018-11-21 DIAGNOSIS — M1990 Unspecified osteoarthritis, unspecified site: Secondary | ICD-10-CM | POA: Diagnosis not present

## 2018-11-21 DIAGNOSIS — Z881 Allergy status to other antibiotic agents status: Secondary | ICD-10-CM | POA: Diagnosis not present

## 2018-11-21 DIAGNOSIS — J449 Chronic obstructive pulmonary disease, unspecified: Secondary | ICD-10-CM | POA: Insufficient documentation

## 2018-11-21 LAB — I-STAT BETA HCG BLOOD, ED (MC, WL, AP ONLY): I-stat hCG, quantitative: <5 m[IU]/mL (ref <5)

## 2018-11-21 LAB — URINALYSIS, ROUTINE W REFLEX MICROSCOPIC
Bacteria, UA: NONE SEEN
Bilirubin Urine: NEGATIVE
Glucose, UA: NEGATIVE mg/dL
Ketones, ur: NEGATIVE mg/dL
Leukocytes,Ua: NEGATIVE
Nitrite: NEGATIVE
Protein, ur: NEGATIVE mg/dL
Specific Gravity, Urine: 1.011 (ref 1.005–1.030)
pH: 7 (ref 5.0–8.0)

## 2018-11-21 LAB — CBC WITH DIFFERENTIAL/PLATELET
Abs Immature Granulocytes: 0.02 10*3/uL (ref 0.00–0.07)
Basophils Absolute: 0 10*3/uL (ref 0.0–0.1)
Basophils Relative: 1 %
Eosinophils Absolute: 0 10*3/uL (ref 0.0–0.5)
Eosinophils Relative: 0 %
HCT: 41 % (ref 36.0–46.0)
Hemoglobin: 13 g/dL (ref 12.0–15.0)
Immature Granulocytes: 0 %
Lymphocytes Relative: 54 %
Lymphs Abs: 3 10*3/uL (ref 0.7–4.0)
MCH: 30.7 pg (ref 26.0–34.0)
MCHC: 31.7 g/dL (ref 30.0–36.0)
MCV: 96.7 fL (ref 80.0–100.0)
Monocytes Absolute: 0.5 10*3/uL (ref 0.1–1.0)
Monocytes Relative: 8 %
Neutro Abs: 2 10*3/uL (ref 1.7–7.7)
Neutrophils Relative %: 37 %
Platelets: 168 10*3/uL (ref 150–400)
RBC: 4.24 MIL/uL (ref 3.87–5.11)
RDW: 14 % (ref 11.5–15.5)
WBC: 5.5 10*3/uL (ref 4.0–10.5)
nRBC: 0 % (ref 0.0–0.2)

## 2018-11-21 LAB — COMPREHENSIVE METABOLIC PANEL
ALT: <5 U/L (ref 0–44)
AST: 16 U/L (ref 15–41)
Albumin: 3.6 g/dL (ref 3.5–5.0)
Alkaline Phosphatase: 62 U/L (ref 38–126)
Anion gap: 12 (ref 5–15)
BUN: 8 mg/dL (ref 6–20)
CO2: 19 mmol/L — ABNORMAL LOW (ref 22–32)
Calcium: 8.6 mg/dL — ABNORMAL LOW (ref 8.9–10.3)
Chloride: 107 mmol/L (ref 98–111)
Creatinine, Ser: 0.83 mg/dL (ref 0.44–1.00)
GFR calc Af Amer: >60 mL/min (ref >60)
GFR calc non Af Amer: >60 mL/min (ref >60)
Glucose, Bld: 93 mg/dL (ref 70–99)
Potassium: 4.1 mmol/L (ref 3.5–5.1)
Sodium: 138 mmol/L (ref 135–145)
Total Bilirubin: 0.4 mg/dL (ref 0.3–1.2)
Total Protein: 6 g/dL — ABNORMAL LOW (ref 6.5–8.1)

## 2018-11-21 MED ORDER — INSULIN GLARGINE 100 UNIT/ML ~~LOC~~ SOLN: 20.0000 [IU] | Freq: Every day | SUBCUTANEOUS | 0 refills | Status: DC

## 2018-11-21 MED ORDER — "INSULIN SYRINGE-NEEDLE U-100 25G X 5/8"" 1 ML MISC": 0 refills | Status: DC

## 2018-11-21 MED ORDER — ACCU-CHEK SOFT TOUCH LANCETS MISC: 12 refills | Status: DC

## 2018-11-21 MED ORDER — FLUTICASONE-SALMETEROL 100-50 MCG/DOSE IN AEPB: 1.0000 | INHALATION_SPRAY | Freq: Two times a day (BID) | RESPIRATORY_TRACT | 0 refills | Status: DC

## 2018-11-21 MED ORDER — MORPHINE SULFATE (PF) 4 MG/ML IV SOLN: 4.0000 mg | Freq: Once | INTRAVENOUS | Status: DC

## 2018-11-21 MED ORDER — SODIUM CHLORIDE 0.9 % IV BOLUS: 1000.0000 mL | Freq: Once | INTRAVENOUS | Status: DC

## 2018-11-21 NOTE — ED Notes (Signed)
Patient cursing and screaming at staff. [employer] and Security at bedside. PA Ward back at bedside and patient continuing to be disrespectful to PA and continuing to yell at staff. Patients evaluation ended and will be escorted off property. As patient exited room told Megan phlebotomist " go kill yourself".

## 2018-11-21 NOTE — ED Notes (Signed)
This RN in room. Pt screaming and yelling 4 letter words at staff members. When asked why pt had outburst the pt told this RN "the black bitch that came to draw my blood stood outside my room for 30 minutes laughing and joking" Pt was on the phone while IV tried to enter room and began yelling at IV team. Tawanna Solo RN was in the area and came into the room to check on the pt and the pt stated "Bitch I will slap the english back in you that way you will be able to speak it better" Security came to room and pt curing at security. York Cerise PA the provider in room to speak with pt. This RN trying to calm pt down so that we can get her an IV, pain medicine and fluids for her CT scan. Pt continues to scream at this RN, the provider, security and charge nurse. Pt states "it's always these little african black bitches y'all got working here that give me problems. If you want respect from me you gotta give me respect." Pt informed that she is being discharged and we will not tolerate her behavior with staff. Pt got dressed, gathered her belongings and escorted to waiting room by this RN and security. While pt walked down the hall she passed by [facility] phlebotomy and attempted to cough on her and stated "bitch I hope you get covid and die" And did the same to [employer]. Also stated "I am filing a lawsuit on [place]" Pt now gone.
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
## 2018-11-21 NOTE — Discharge Instructions (Signed)
Please follow up with your primary care provider for recheck of your BP and medication management.  Will notify you of any positive findings from your covid testing. You may monitor your results on your MyChart online as well.

## 2018-11-21 NOTE — ED Notes (Signed)
Tried twice to get IV, but unsuccessful. Notified IV team

## 2018-11-21 NOTE — ED Notes (Addendum)
Went to collect pt urine pt states she has gave urine in triage

## 2018-11-21 NOTE — ED Triage Notes (Addendum)
Pt report bilat flank pain radiating around to lower abdomen, endorses diarrhea since abx.  Pt  denies fever, dysuria, vaginal discharge, n/v.

## 2018-11-21 NOTE — ED Notes (Signed)
Patient is on her phone cursing and screaming stating she was expecting to get blood drawn but the IV-team RN is telling her that she was there to an insert an IV. This RN asked if patient let me help her to find out what needs to be done, PT states "I will slap english back into you-African bitch."  As I was walking away, the patient jumps from the bed to door screaming and using vulgar words demanding that I come back to the room so that she can punch me stating "You African bitch, come back here, I will fucking punch you, slap english back into you." "IT'S ALWAYS THE AFRICAN BITCHES THAT I HAVE A PROBLEM WITH." This RN continues to walk away as other staff members and Charge nurse come in to intervene.

## 2018-11-21 NOTE — ED Triage Notes (Addendum)
Pt reports needing Covid test for upcoming studies @ [facility].  Denies any sxs.  C/O needing Lantus and Advair refills.  STates has appt with PCP coming up, but PCP had been out of office for a month; pt ran out of Lantus approx 2 wks ago.  Also needing needles for insulin and Vit D refill.

## 2018-11-21 NOTE — Progress Notes (Signed)
Patient at this time upset and feels she was disrespected due to me stating I was here to start an IV on her not draw her labs because she didn't have any lab orders. She begin cursing on the phone and cursing in the hallway and when the ED RN asked if there was a problem she could help fix she started cursing at the ED RN and stated she would slap the English back into her, the patient then slammed the door and refused PIV placement by this RN, Charge Nurse attempted to resolve the issues, another IV RN will be notified to try an attempt.

## 2018-11-22 LAB — NOVEL CORONAVIRUS, NAA (HOSP ORDER, SEND-OUT TO REF LAB; TAT 18-24 HRS): SARS-CoV-2, NAA: NOT DETECTED
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
## 2018-12-01 ENCOUNTER — Other Ambulatory Visit: Payer: Self-pay | Admitting: Nurse Practitioner

## 2018-12-01 DIAGNOSIS — E1165 Type 2 diabetes mellitus with hyperglycemia: Secondary | ICD-10-CM

## 2018-12-01 DIAGNOSIS — IMO0002 Reserved for concepts with insufficient information to code with codable children: Secondary | ICD-10-CM

## 2018-12-22 ENCOUNTER — Ambulatory Visit (HOSPITAL_COMMUNITY)
Admission: EM | Admit: 2018-12-22 | Discharge: 2018-12-22 | Disposition: A | Discharge status: Home / Self Care | Payer: Medicaid Other | Attending: Family Medicine | Admitting: Family Medicine

## 2018-12-22 ENCOUNTER — Encounter (HOSPITAL_COMMUNITY): Payer: Self-pay

## 2018-12-22 ENCOUNTER — Other Ambulatory Visit: Payer: Self-pay

## 2018-12-22 DIAGNOSIS — E119 Type 2 diabetes mellitus without complications: Secondary | ICD-10-CM

## 2018-12-22 DIAGNOSIS — J441 Chronic obstructive pulmonary disease with (acute) exacerbation: Secondary | ICD-10-CM

## 2018-12-22 DIAGNOSIS — Z794 Long term (current) use of insulin: Secondary | ICD-10-CM | POA: Diagnosis not present

## 2018-12-22 DIAGNOSIS — Z760 Encounter for issue of repeat prescription: Secondary | ICD-10-CM | POA: Diagnosis not present

## 2018-12-22 DIAGNOSIS — I10 Essential (primary) hypertension: Secondary | ICD-10-CM

## 2018-12-22 MED ORDER — FLUTICASONE-SALMETEROL 100-50 MCG/DOSE IN AEPB: 1.0000 | INHALATION_SPRAY | Freq: Two times a day (BID) | RESPIRATORY_TRACT | 0 refills | Status: DC

## 2018-12-22 MED ORDER — PREDNISONE 10 MG PO TABS: 20.0000 mg | ORAL_TABLET | Freq: Every day | ORAL | 0 refills | Status: AC

## 2018-12-22 MED ORDER — INSULIN GLARGINE 100 UNIT/ML ~~LOC~~ SOLN: 20.0000 [IU] | Freq: Every day | SUBCUTANEOUS | 0 refills | Status: DC
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
## 2018-12-22 NOTE — Discharge Instructions (Addendum)
Refilled your medication as requested  Prednisone daily for 5 days for asthma exacerbation.  Keeping using your inhalers as needed.  Be aware this may increase your blood sugars. Make sure you are checking them  Follow up with your doctor as planned.

## 2018-12-22 NOTE — ED Triage Notes (Signed)
Pt states she needs a refill on her meds for asthma and diabetes.

## 2019-01-05 ENCOUNTER — Other Ambulatory Visit: Payer: Self-pay | Admitting: Nurse Practitioner

## 2019-01-05 DIAGNOSIS — IMO0002 Reserved for concepts with insufficient information to code with codable children: Secondary | ICD-10-CM

## 2019-01-05 DIAGNOSIS — E1165 Type 2 diabetes mellitus with hyperglycemia: Secondary | ICD-10-CM

## 2019-01-10 ENCOUNTER — Other Ambulatory Visit: Payer: Self-pay

## 2019-01-10 ENCOUNTER — Ambulatory Visit (HOSPITAL_COMMUNITY)
Admission: EM | Admit: 2019-01-10 | Discharge: 2019-01-10 | Disposition: A | Discharge status: Home / Self Care | Payer: Medicaid Other | Attending: Emergency Medicine | Admitting: Emergency Medicine

## 2019-01-10 ENCOUNTER — Encounter (HOSPITAL_COMMUNITY): Payer: Self-pay | Admitting: *Deleted

## 2019-01-10 DIAGNOSIS — G40409 Other generalized epilepsy and epileptic syndromes, not intractable, without status epilepticus: Secondary | ICD-10-CM | POA: Diagnosis not present

## 2019-01-10 DIAGNOSIS — Z79899 Other long term (current) drug therapy: Secondary | ICD-10-CM | POA: Diagnosis not present

## 2019-01-10 DIAGNOSIS — G43009 Migraine without aura, not intractable, without status migrainosus: Secondary | ICD-10-CM | POA: Insufficient documentation

## 2019-01-10 DIAGNOSIS — I10 Essential (primary) hypertension: Secondary | ICD-10-CM | POA: Diagnosis not present

## 2019-01-10 DIAGNOSIS — E119 Type 2 diabetes mellitus without complications: Secondary | ICD-10-CM | POA: Diagnosis not present

## 2019-01-10 DIAGNOSIS — M542 Cervicalgia: Secondary | ICD-10-CM | POA: Insufficient documentation

## 2019-01-10 DIAGNOSIS — R112 Nausea with vomiting, unspecified: Secondary | ICD-10-CM | POA: Diagnosis not present

## 2019-01-10 DIAGNOSIS — B20 Human immunodeficiency virus [HIV] disease: Secondary | ICD-10-CM | POA: Diagnosis not present

## 2019-01-10 DIAGNOSIS — J449 Chronic obstructive pulmonary disease, unspecified: Secondary | ICD-10-CM | POA: Diagnosis not present

## 2019-01-10 DIAGNOSIS — M1990 Unspecified osteoarthritis, unspecified site: Secondary | ICD-10-CM | POA: Diagnosis not present

## 2019-01-10 DIAGNOSIS — Z20828 Contact with and (suspected) exposure to other viral communicable diseases: Secondary | ICD-10-CM | POA: Diagnosis not present

## 2019-01-10 DIAGNOSIS — M797 Fibromyalgia: Secondary | ICD-10-CM | POA: Diagnosis not present

## 2019-01-10 DIAGNOSIS — Z86718 Personal history of other venous thrombosis and embolism: Secondary | ICD-10-CM | POA: Diagnosis not present

## 2019-01-10 DIAGNOSIS — F17210 Nicotine dependence, cigarettes, uncomplicated: Secondary | ICD-10-CM | POA: Diagnosis not present

## 2019-01-10 DIAGNOSIS — G43001 Migraine without aura, not intractable, with status migrainosus: Secondary | ICD-10-CM

## 2019-01-10 DIAGNOSIS — K219 Gastro-esophageal reflux disease without esophagitis: Secondary | ICD-10-CM | POA: Insufficient documentation

## 2019-01-10 DIAGNOSIS — Z794 Long term (current) use of insulin: Secondary | ICD-10-CM | POA: Diagnosis not present

## 2019-01-10 HISTORY — DX: Migraine, unspecified, not intractable, without status migrainosus: G43.909

## 2019-01-10 MED ORDER — KETOROLAC TROMETHAMINE 30 MG/ML IJ SOLN
30.0000 mg | Freq: Once | INTRAMUSCULAR | Status: AC
  Administered 2019-01-10: 30 mg via INTRAMUSCULAR

## 2019-01-10 MED ORDER — DEXAMETHASONE SODIUM PHOSPHATE 10 MG/ML IJ SOLN
INTRAMUSCULAR | Status: AC
  Filled 2019-01-10: qty 1

## 2019-01-10 MED ORDER — DIPHENHYDRAMINE HCL 25 MG PO CAPS
ORAL_CAPSULE | ORAL | Status: AC
  Filled 2019-01-10: qty 1

## 2019-01-10 MED ORDER — DIPHENHYDRAMINE HCL 25 MG PO CAPS
50.0000 mg | ORAL_CAPSULE | Freq: Once | ORAL | Status: AC
  Administered 2019-01-10: 50 mg via ORAL

## 2019-01-10 MED ORDER — KETOROLAC TROMETHAMINE 30 MG/ML IJ SOLN
INTRAMUSCULAR | Status: AC
  Filled 2019-01-10: qty 1

## 2019-01-10 MED ORDER — METOCLOPRAMIDE HCL 5 MG/ML IJ SOLN
INTRAMUSCULAR | Status: AC
  Filled 2019-01-10: qty 2

## 2019-01-10 MED ORDER — DEXAMETHASONE SODIUM PHOSPHATE 10 MG/ML IJ SOLN
10.0000 mg | Freq: Once | INTRAMUSCULAR | Status: AC
  Administered 2019-01-10: 10 mg via INTRAMUSCULAR

## 2019-01-10 MED ORDER — METOCLOPRAMIDE HCL 5 MG/ML IJ SOLN
10.0000 mg | Freq: Once | INTRAMUSCULAR | Status: AC
  Administered 2019-01-10: 10 mg via INTRAMUSCULAR

## 2019-01-10 NOTE — ED Triage Notes (Signed)
Started 3 wks ago with migraine (has hx) - saw PCP - had injection, given Rx for sumatriptan and butalbital/acetaminophen, and nausea med, but no improvement in HA at all.  Has tried ice packs, hot steam treatment.  Continues with nausea; has had vomiting intermittently x 3 wks; has had diarrhea x 1 wk.  Denies fevers. MRI, CT done at [facility] 12/23/18 for f/u seizures - states all came back neg.  Also has her "normal" cough, SOB from COPD - states no worse than usual.
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
## 2019-01-10 NOTE — Discharge Instructions (Addendum)
Take a Tylenol containing product 3 or 4 times a day.  Either 1 g of Tylenol for mild to moderate pain, or Fioricet.  Take Flexeril at night to help you prevent you from grinding your teeth.  Adjust your insulin because the dexamethasone will raise your sugar over the next several days.

## 2019-01-11 ENCOUNTER — Emergency Department (HOSPITAL_COMMUNITY)
Admission: EM | Admit: 2019-01-11 | Discharge: 2019-01-11 | Disposition: A | Discharge status: Home / Self Care | Payer: Medicaid Other | Attending: Emergency Medicine | Admitting: Emergency Medicine

## 2019-01-11 ENCOUNTER — Ambulatory Visit (HOSPITAL_COMMUNITY)
Admission: EM | Admit: 2019-01-11 | Discharge: 2019-01-11 | Disposition: A | Discharge status: Home / Self Care | Payer: Medicaid Other

## 2019-01-11 ENCOUNTER — Other Ambulatory Visit: Payer: Self-pay

## 2019-01-11 ENCOUNTER — Encounter (HOSPITAL_COMMUNITY): Payer: Self-pay

## 2019-01-11 DIAGNOSIS — F17210 Nicotine dependence, cigarettes, uncomplicated: Secondary | ICD-10-CM | POA: Insufficient documentation

## 2019-01-11 DIAGNOSIS — Z794 Long term (current) use of insulin: Secondary | ICD-10-CM | POA: Insufficient documentation

## 2019-01-11 DIAGNOSIS — Z91040 Latex allergy status: Secondary | ICD-10-CM | POA: Insufficient documentation

## 2019-01-11 DIAGNOSIS — G43909 Migraine, unspecified, not intractable, without status migrainosus: Secondary | ICD-10-CM | POA: Insufficient documentation

## 2019-01-11 DIAGNOSIS — R51 Headache: Secondary | ICD-10-CM | POA: Diagnosis present

## 2019-01-11 DIAGNOSIS — Z79899 Other long term (current) drug therapy: Secondary | ICD-10-CM | POA: Diagnosis not present

## 2019-01-11 DIAGNOSIS — G40409 Other generalized epilepsy and epileptic syndromes, not intractable, without status epilepticus: Secondary | ICD-10-CM | POA: Diagnosis not present

## 2019-01-11 DIAGNOSIS — I10 Essential (primary) hypertension: Secondary | ICD-10-CM | POA: Diagnosis not present

## 2019-01-11 DIAGNOSIS — B20 Human immunodeficiency virus [HIV] disease: Secondary | ICD-10-CM | POA: Diagnosis not present

## 2019-01-11 DIAGNOSIS — Z880 Allergy status to penicillin: Secondary | ICD-10-CM | POA: Diagnosis not present

## 2019-01-11 DIAGNOSIS — Z86718 Personal history of other venous thrombosis and embolism: Secondary | ICD-10-CM | POA: Diagnosis not present

## 2019-01-11 DIAGNOSIS — Z91030 Bee allergy status: Secondary | ICD-10-CM | POA: Diagnosis not present

## 2019-01-11 DIAGNOSIS — E860 Dehydration: Secondary | ICD-10-CM

## 2019-01-11 DIAGNOSIS — R112 Nausea with vomiting, unspecified: Secondary | ICD-10-CM

## 2019-01-11 DIAGNOSIS — Z888 Allergy status to other drugs, medicaments and biological substances status: Secondary | ICD-10-CM | POA: Diagnosis not present

## 2019-01-11 DIAGNOSIS — E119 Type 2 diabetes mellitus without complications: Secondary | ICD-10-CM | POA: Diagnosis not present

## 2019-01-11 LAB — CBC WITH DIFFERENTIAL/PLATELET
Abs Immature Granulocytes: 0.03 10*3/uL (ref 0.00–0.07)
Basophils Absolute: 0 10*3/uL (ref 0.0–0.1)
Basophils Relative: 0 %
Eosinophils Absolute: 0 10*3/uL (ref 0.0–0.5)
Eosinophils Relative: 0 %
HCT: 41.4 % (ref 36.0–46.0)
Hemoglobin: 13.6 g/dL (ref 12.0–15.0)
Immature Granulocytes: 0 %
Lymphocytes Relative: 38 %
Lymphs Abs: 3.7 10*3/uL (ref 0.7–4.0)
MCH: 31.3 pg (ref 26.0–34.0)
MCHC: 32.9 g/dL (ref 30.0–36.0)
MCV: 95.4 fL (ref 80.0–100.0)
Monocytes Absolute: 0.7 10*3/uL (ref 0.1–1.0)
Monocytes Relative: 7 %
Neutro Abs: 5.1 10*3/uL (ref 1.7–7.7)
Neutrophils Relative %: 55 %
Platelets: 198 10*3/uL (ref 150–400)
RBC: 4.34 MIL/uL (ref 3.87–5.11)
RDW: 13.9 % (ref 11.5–15.5)
WBC: 9.5 10*3/uL (ref 4.0–10.5)
nRBC: 0 % (ref 0.0–0.2)

## 2019-01-11 LAB — BASIC METABOLIC PANEL
Anion gap: 13 (ref 5–15)
BUN: 8 mg/dL (ref 6–20)
CO2: 19 mmol/L — ABNORMAL LOW (ref 22–32)
Calcium: 9.1 mg/dL (ref 8.9–10.3)
Chloride: 106 mmol/L (ref 98–111)
Creatinine, Ser: 0.87 mg/dL (ref 0.44–1.00)
GFR calc Af Amer: >60 mL/min (ref >60)
GFR calc non Af Amer: >60 mL/min (ref >60)
Glucose, Bld: 82 mg/dL (ref 70–99)
Potassium: 3.9 mmol/L (ref 3.5–5.1)
Sodium: 138 mmol/L (ref 135–145)

## 2019-01-11 LAB — I-STAT BETA HCG BLOOD, ED (MC, WL, AP ONLY): I-stat hCG, quantitative: <5 m[IU]/mL (ref <5)

## 2019-01-11 MED ORDER — DIPHENHYDRAMINE HCL 50 MG/ML IJ SOLN
12.5000 mg | Freq: Once | INTRAMUSCULAR | Status: AC
  Administered 2019-01-11: 12.5 mg via INTRAVENOUS
  Filled 2019-01-11: qty 1

## 2019-01-11 MED ORDER — HYDROCODONE-ACETAMINOPHEN 5-325 MG PO TABS: 1.0000 | ORAL_TABLET | ORAL | 0 refills | Status: DC | PRN

## 2019-01-11 MED ORDER — MORPHINE SULFATE (PF) 4 MG/ML IV SOLN
4.0000 mg | Freq: Once | INTRAVENOUS | Status: AC
  Administered 2019-01-11: 14:00:00 4 mg via INTRAVENOUS
  Filled 2019-01-11: qty 1

## 2019-01-11 MED ORDER — MORPHINE SULFATE (PF) 4 MG/ML IV SOLN
4.0000 mg | Freq: Once | INTRAVENOUS | Status: AC
  Administered 2019-01-11: 4 mg via INTRAVENOUS
  Filled 2019-01-11: qty 1

## 2019-01-11 MED ORDER — PROMETHAZINE HCL 25 MG/ML IJ SOLN
12.5000 mg | Freq: Once | INTRAMUSCULAR | Status: AC
  Administered 2019-01-11: 12.5 mg via INTRAVENOUS
  Filled 2019-01-11: qty 1

## 2019-01-11 MED ORDER — PROMETHAZINE HCL 25 MG PO TABS: 25.0000 mg | ORAL_TABLET | Freq: Four times a day (QID) | ORAL | 0 refills | Status: DC | PRN

## 2019-01-11 MED ORDER — SODIUM CHLORIDE 0.9 % IV BOLUS
1000.0000 mL | Freq: Once | INTRAVENOUS | Status: AC
  Administered 2019-01-11: 14:00:00 1000 mL via INTRAVENOUS

## 2019-01-11 MED ORDER — SODIUM CHLORIDE 0.9 % IV SOLN: INTRAVENOUS | Status: DC

## 2019-01-11 NOTE — ED Notes (Signed)
Patient c/o headache for 3 weeks States she has been treated several times for same however she is still having a headache, has an appointment with  Neurologist tomorrow and appointment with her PCP on fri.
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
## 2019-01-11 NOTE — ED Triage Notes (Signed)
Patient complains of ongoing right sided headache x 3 weeks. Denies trauma. [facility] with no relief. Alert and oriented. No cough. No fever. NAD

## 2019-01-11 NOTE — ED Notes (Signed)
Pt returning with worsening headache from yesterday.  Pt given migraine cocktail yesterday with relief from 10/10-8/10.  Pt states headache worse today.  Dr. Alphonzo Cruise initially wanted the pt to go to the ED yesterday, so pt was willing to go to ED today.  Pt was in no acute distress.  Spoke with Augusto Gamble, NP and she was ok with the plan to send to ED

## 2019-01-12 LAB — NOVEL CORONAVIRUS, NAA (HOSP ORDER, SEND-OUT TO REF LAB; TAT 18-24 HRS): SARS-CoV-2, NAA: NOT DETECTED

## 2019-01-15 ENCOUNTER — Other Ambulatory Visit: Payer: Self-pay | Admitting: *Deleted

## 2019-01-15 DIAGNOSIS — Z113 Encounter for screening for infections with a predominantly sexual mode of transmission: Secondary | ICD-10-CM

## 2019-01-15 DIAGNOSIS — B20 Human immunodeficiency virus [HIV] disease: Secondary | ICD-10-CM

## 2019-01-20 ENCOUNTER — Other Ambulatory Visit (HOSPITAL_COMMUNITY)
Admission: RE | Admit: 2019-01-20 | Discharge: 2019-01-20 | Disposition: A | Discharge status: Home / Self Care | Payer: Medicaid Other | Source: Ambulatory Visit | Attending: Family | Admitting: Family

## 2019-01-20 ENCOUNTER — Other Ambulatory Visit: Payer: Self-pay

## 2019-01-20 ENCOUNTER — Other Ambulatory Visit: Payer: Medicaid Other

## 2019-01-20 DIAGNOSIS — Z113 Encounter for screening for infections with a predominantly sexual mode of transmission: Secondary | ICD-10-CM

## 2019-01-20 DIAGNOSIS — B20 Human immunodeficiency virus [HIV] disease: Secondary | ICD-10-CM

## 2019-01-21 LAB — T-HELPER CELL (CD4) - (RCID CLINIC ONLY)
CD4 % Helper T Cell: 16 % — ABNORMAL LOW (ref 33–65)
CD4 T Cell Abs: 256 /uL — ABNORMAL LOW (ref 400–1790)

## 2019-01-25 LAB — CBC WITH DIFFERENTIAL/PLATELET
Absolute Monocytes: 172 cells/uL — ABNORMAL LOW (ref 200–950)
Basophils Absolute: 33 cells/uL (ref 0–200)
Basophils Relative: 0.5 %
Eosinophils Absolute: 0 cells/uL — ABNORMAL LOW (ref 15–500)
Eosinophils Relative: 0 %
HCT: 40.4 % (ref 35.0–45.0)
Hemoglobin: 13.5 g/dL (ref 11.7–15.5)
Lymphs Abs: 1703 cells/uL (ref 850–3900)
MCH: 30.8 pg (ref 27.0–33.0)
MCHC: 33.4 g/dL (ref 32.0–36.0)
MCV: 92 fL (ref 80.0–100.0)
MPV: 11.8 fL (ref 7.5–12.5)
Monocytes Relative: 2.6 %
Neutro Abs: 4693 cells/uL (ref 1500–7800)
Neutrophils Relative %: 71.1 %
Platelets: 234 10*3/uL (ref 140–400)
RBC: 4.39 10*6/uL (ref 3.80–5.10)
RDW: 13.4 % (ref 11.0–15.0)
Total Lymphocyte: 25.8 %
WBC: 6.6 10*3/uL (ref 3.8–10.8)

## 2019-01-25 LAB — COMPLETE METABOLIC PANEL WITH GFR
AG Ratio: 1.4 (calc) (ref 1.0–2.5)
ALT: 9 U/L (ref 6–29)
AST: 11 U/L (ref 10–30)
Albumin: 4.3 g/dL (ref 3.6–5.1)
Alkaline phosphatase (APISO): 61 U/L (ref 31–125)
BUN: 14 mg/dL (ref 7–25)
CO2: 25 mmol/L (ref 20–32)
Calcium: 9.7 mg/dL (ref 8.6–10.2)
Chloride: 107 mmol/L (ref 98–110)
Creat: 0.95 mg/dL (ref 0.50–1.10)
GFR, Est African American: 86 mL/min/{1.73_m2} (ref >=60)
GFR, Est Non African American: 74 mL/min/{1.73_m2} (ref >=60)
Globulin: 3 g/dL (calc) (ref 1.9–3.7)
Glucose, Bld: 118 mg/dL — ABNORMAL HIGH (ref 65–99)
Potassium: 4.4 mmol/L (ref 3.5–5.3)
Sodium: 137 mmol/L (ref 135–146)
Total Bilirubin: 0.3 mg/dL (ref 0.2–1.2)
Total Protein: 7.3 g/dL (ref 6.1–8.1)

## 2019-01-25 LAB — HIV-1 RNA QUANT-NO REFLEX-BLD
HIV 1 RNA Quant: 30400 copies/mL — ABNORMAL HIGH
HIV-1 RNA Quant, Log: 4.48 Log copies/mL — ABNORMAL HIGH

## 2019-01-25 LAB — RPR: RPR Ser Ql: NONREACTIVE

## 2019-01-28 ENCOUNTER — Telehealth: Payer: Medicaid Other

## 2019-01-28 LAB — URINE CYTOLOGY ANCILLARY ONLY
Chlamydia: NEGATIVE
Comment: NEGATIVE
Comment: NORMAL
Neisseria Gonorrhea: NEGATIVE

## 2019-02-01 ENCOUNTER — Telehealth: Payer: Self-pay | Admitting: Primary Care

## 2019-02-01 DIAGNOSIS — E1165 Type 2 diabetes mellitus with hyperglycemia: Secondary | ICD-10-CM

## 2019-02-01 DIAGNOSIS — IMO0002 Reserved for concepts with insufficient information to code with codable children: Secondary | ICD-10-CM

## 2019-02-01 NOTE — Telephone Encounter (Signed)
1) Medication(s) Requested (by name): -VICTOZA 18 MG/3ML SOPN  -sitaGLIPtin (JANUVIA) 50 MG tablet  -SYMBICORT 80-4.5 MCG/ACT inhaler  -amLODipine (NORVASC) 10 MG tablet  -bictegravir-emtricitabine-tenofovir AF (BIKTARVY) 50-200-25 MG TABS tablet  -clonazePAM (KLONOPIN) 1 MG tablet  -cyclobenzaprine (FLEXERIL) 10 MG tablet  -divalproex (DEPAKOTE ER) 500 MG 24 hr tablet  -gabapentin (NEURONTIN) 400 MG capsule  -hydrochlorothiazide (HYDRODIURIL) 25 MG tablet  -insulin glargine (LANTUS) 100 UNIT/ML injection  -Insulin Syringe-Needle U-100 25G X 5/8" 1 ML MISC  -promethazine (PHENERGAN) 25 MG tablet   2) Pharmacy of Choice: [facility] [place], [place] - [address] AT [address]  3) Special Requests: Please refill Until her next appointment on 02/18/2019 if possible, thank you

## 2019-02-01 NOTE — Telephone Encounter (Signed)
Pt called 02/01/2019 @ 3:57pm in regards to her medications being denied, pt is aware that an appointment is needed as shown on her most recent refused medication and she was a NO SHOW on her follow up appointment 08/19/2018. Patient thinks it is unprofessional that we have not contacted her in regards to this issue, I apologized for the inconvenience and tried to explain that many options are at her disposal to continue care at our clinic and as long as she follows her plan of treatment and she wont run out of medications, she did not let me finish talking stating our providers are being racist because of this and if she voted for Trump she would get better treatment and her medications would always be at her pharmacy. We were able to schedule her the next available appointment and request her medications filled until her next appointment before she began using vulgar language once again towards our staff threatening to come in person and making a scene. She then stated she will call her lawyer because we wont fill her medications and want her to die and will try her best to shut this location down after the use of more vulgar language she hung up the phone.

## 2019-02-02 ENCOUNTER — Other Ambulatory Visit: Payer: Self-pay

## 2019-02-02 MED ORDER — HYDROCHLOROTHIAZIDE 25 MG PO TABS: 25.0000 mg | ORAL_TABLET | Freq: Every day | ORAL | 0 refills | Status: DC

## 2019-02-02 MED ORDER — AMLODIPINE BESYLATE 10 MG PO TABS: 10.0000 mg | ORAL_TABLET | Freq: Every day | ORAL | 0 refills | Status: DC

## 2019-02-02 MED ORDER — SITAGLIPTIN PHOSPHATE 50 MG PO TABS: 50.0000 mg | ORAL_TABLET | Freq: Every day | ORAL | 0 refills | Status: DC

## 2019-02-02 MED ORDER — VICTOZA 18 MG/3ML ~~LOC~~ SOPN: 1.2000 mg | PEN_INJECTOR | Freq: Every day | SUBCUTANEOUS | 0 refills | Status: DC

## 2019-02-02 MED ORDER — BUDESONIDE-FORMOTEROL FUMARATE 80-4.5 MCG/ACT IN AERO: INHALATION_SPRAY | RESPIRATORY_TRACT | 0 refills | Status: DC

## 2019-02-02 MED ORDER — INSULIN GLARGINE 100 UNIT/ML ~~LOC~~ SOLN: 20.0000 [IU] | Freq: Every day | SUBCUTANEOUS | 0 refills | Status: DC

## 2019-02-02 NOTE — Telephone Encounter (Signed)
It looks like her DM and HTN medications, as well as her Symbicort, were refilled already by Dr. Margarita Rana. She is requesting HIV and neurology medications, and Dr. Margarita Rana has requested that she speak with her HIV and Neurology offices respectively for those.

## 2019-02-02 NOTE — Telephone Encounter (Signed)
Lori Ford can you please give this patient a call with the information that select medications have been sent to her pharmacy until her appointment?  She will need to get in touch with the HIV clinic for her HIV medications and with a neurologist for her neurology medications. We do not condone using vulgar language with the staff of this clinic or any form of violent behavior as these are grounds for dismissal from our clinic.  We are happy to provide her primary care going forward but these are expectations we have from our patients as well to enable [place] provide the best quality care.  Thank you.

## 2019-02-02 NOTE — Telephone Encounter (Signed)
Called patient to let her know of her medication being sent until her appointment. Also talked to patient about not being aggressive with the front office staff and that we try to help patients and give them exceptional care. Patient said "ok" and said that she was going to be admitted to [facility] on 11/2-11/4 because she is going to have a procedure done. Patient stated she was going to inform [facility] to fax over her records.

## 2019-02-08 ENCOUNTER — Encounter: Payer: Medicaid Other | Admitting: Family

## 2019-02-08 ENCOUNTER — Telehealth: Payer: Self-pay | Admitting: Primary Care

## 2019-02-08 NOTE — Telephone Encounter (Signed)
1) Medication(s) Requested (by name): Disc inhaler  2) Pharmacy of Choice:  [facility]

## 2019-02-09 ENCOUNTER — Ambulatory Visit (INDEPENDENT_AMBULATORY_CARE_PROVIDER_SITE_OTHER): Payer: Medicaid Other | Admitting: Family

## 2019-02-09 ENCOUNTER — Other Ambulatory Visit: Payer: Self-pay

## 2019-02-09 ENCOUNTER — Encounter: Payer: Self-pay | Admitting: Family

## 2019-02-09 DIAGNOSIS — F339 Major depressive disorder, recurrent, unspecified: Secondary | ICD-10-CM | POA: Diagnosis not present

## 2019-02-09 DIAGNOSIS — B20 Human immunodeficiency virus [HIV] disease: Secondary | ICD-10-CM

## 2019-02-09 DIAGNOSIS — R569 Unspecified convulsions: Secondary | ICD-10-CM

## 2019-02-09 MED ORDER — DOLUTEGRAVIR SODIUM 50 MG PO TABS: 50.0000 mg | ORAL_TABLET | Freq: Two times a day (BID) | ORAL | 1 refills | Status: DC

## 2019-02-09 MED ORDER — ABACAVIR SULFATE-LAMIVUDINE 600-300 MG PO TABS: 1.0000 | ORAL_TABLET | Freq: Every day | ORAL | 1 refills | Status: DC

## 2019-02-09 NOTE — Assessment & Plan Note (Signed)
Ms. Tomlinson has poorly controlled HIV disease secondary to less than optimal adherence to her medication regimen due to fear of losing custody of her children to her family as they are unaware of her diagnosis.  Fortunately she has no signs/symptoms of opportunistic infection or progressive HIV disease.  We discussed the importance of taking medication as prescribed to reduce risk of progression of HIV disease and complications in the future.  It would also support her case to be taking her medication as prescribed.  Discussed restarting medications briefly and noted there to be a medication interaction with her Dilantin and Biktarvy.  After discussing with pharmacy return to the exam room and she had a left prior to discussing change in medication.  Prescription for twice daily Tivicay and once daily Epzicom were sent to the pharmacy.  An appointment was made for counseling on Thursday which she was encouraged to attend.  Encouraged to continue to discuss with [place]. Will plan to follow up in 1 month or sooner if needed.
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
## 2019-02-09 NOTE — Patient Instructions (Signed)
Nice to see you.   We will get you started on Tivicay twice daily and Epzicom once daily.   Recommend counseling and working with [employer].   Plan for follow up in 1 month or sooner if needed to recheck labs.

## 2019-02-09 NOTE — Assessment & Plan Note (Signed)
Lori Ford has depression likely multifactorial with primary stressor being her family. Denies thoughts of harming herself and has no symptoms of psychosis. She certainly would benefit from counseling and appointment was made with counselor here at [facility]. Continue to monitor

## 2019-02-09 NOTE — Assessment & Plan Note (Signed)
In care with [facility]. Per her report she continues to have seizures. Medication adjusted to provide optimal therapy with her current medications.

## 2019-02-10 ENCOUNTER — Other Ambulatory Visit (INDEPENDENT_AMBULATORY_CARE_PROVIDER_SITE_OTHER): Payer: Self-pay | Admitting: Primary Care

## 2019-02-10 MED ORDER — BUDESONIDE-FORMOTEROL FUMARATE 80-4.5 MCG/ACT IN AERO: INHALATION_SPRAY | RESPIRATORY_TRACT | 3 refills | Status: DC

## 2019-02-10 NOTE — Telephone Encounter (Signed)
Sent Symbicort in and discontinue Advair she did not both

## 2019-02-10 NOTE — Telephone Encounter (Signed)
FWD to PCP. Denny Mccree S Damoney Julia, CMA  

## 2019-02-11 ENCOUNTER — Ambulatory Visit: Payer: Medicaid Other

## 2019-02-16 ENCOUNTER — Other Ambulatory Visit: Payer: Self-pay | Admitting: Family Medicine

## 2019-02-17 ENCOUNTER — Telehealth: Payer: Self-pay

## 2019-02-17 ENCOUNTER — Other Ambulatory Visit: Payer: Self-pay | Admitting: Family

## 2019-02-17 MED ORDER — BICTEGRAVIR-EMTRICITAB-TENOFOV 50-200-25 MG PO TABS: 1.0000 | ORAL_TABLET | Freq: Every day | ORAL | 3 refills | Status: DC

## 2019-02-17 NOTE — Telephone Encounter (Signed)
Spoke with Lori Ford regarding the change in her medications due to interactions with Dilantin and Biktarvy. She proceeded to inform me that she is no longer taking Dilantin and has been off the medication for 6 months now. She has yet to pick up the Epzicom and twice daily Tivicay. Will restart Biktarvy. Prescription sent to the pharmacy and will recheck blood work in 1 month.

## 2019-02-17 NOTE — Telephone Encounter (Signed)
Patient states her medications where changed and this was not discussed with her during her last office visit. Patient would like a phone call today regarding medication changes. Lori Ford

## 2019-02-18 ENCOUNTER — Other Ambulatory Visit: Payer: Self-pay

## 2019-02-18 ENCOUNTER — Encounter: Payer: Self-pay | Admitting: Family Medicine

## 2019-02-18 ENCOUNTER — Ambulatory Visit: Payer: Medicaid Other | Attending: Family Medicine | Admitting: Family Medicine

## 2019-02-18 DIAGNOSIS — J4521 Mild intermittent asthma with (acute) exacerbation: Secondary | ICD-10-CM

## 2019-02-18 DIAGNOSIS — J069 Acute upper respiratory infection, unspecified: Secondary | ICD-10-CM | POA: Diagnosis not present

## 2019-02-18 DIAGNOSIS — E119 Type 2 diabetes mellitus without complications: Secondary | ICD-10-CM

## 2019-02-18 DIAGNOSIS — N926 Irregular menstruation, unspecified: Secondary | ICD-10-CM

## 2019-02-18 DIAGNOSIS — Z794 Long term (current) use of insulin: Secondary | ICD-10-CM

## 2019-02-18 MED ORDER — ALBUTEROL SULFATE HFA 108 (90 BASE) MCG/ACT IN AERS: 2.0000 | INHALATION_SPRAY | Freq: Four times a day (QID) | RESPIRATORY_TRACT | 0 refills | Status: DC | PRN

## 2019-02-18 MED ORDER — PREDNISONE 20 MG PO TABS: ORAL_TABLET | ORAL | 0 refills | Status: DC

## 2019-02-18 MED ORDER — AZITHROMYCIN 250 MG PO TABS: ORAL_TABLET | ORAL | 0 refills | Status: DC
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
## 2019-02-18 NOTE — Progress Notes (Signed)
Patient verified DOB Patient has has taken medication today. Patient has eaten today Patient denies pain at this time. Patient complains of having her cycle coming every 2 weeks for the past 4-5 months. Blood clots and bad cramping. CBG: 119 before eating BP 136/82 HR:  Cough and Congestion began 3 days ago. Patient shares her two grandsons have colds which is where she thinks her symptoms came from. Pylgem is yellow. Patient states [facility] recommended her PCP prescribe Xanax patient states the klonopin caused seizures.

## 2019-02-24 ENCOUNTER — Telehealth: Payer: Self-pay | Admitting: Primary Care

## 2019-02-24 NOTE — Telephone Encounter (Signed)
1) Medication(s) Requested (by name): insulin glargine (LANTUS) 100 unit/mL SOPN   advair inhaler   2) Pharmacy of Choice: [facility] on [address]    3) Special Requests:   Approved medications will be sent to the pharmacy, we will reach out if there is an issue.  Requests made after 3pm may not be addressed until the following business day!  If a patient is unsure of the name of the medication(s) please note and ask patient to call back when they are able to provide all info, do not send to responsible party until all information is available!

## 2019-02-25 ENCOUNTER — Other Ambulatory Visit: Payer: Self-pay

## 2019-02-25 ENCOUNTER — Other Ambulatory Visit: Payer: Self-pay | Admitting: Family Medicine

## 2019-02-25 ENCOUNTER — Ambulatory Visit: Payer: Medicaid Other

## 2019-02-25 MED ORDER — INSULIN GLARGINE 100 UNITS/ML SOLOSTAR PEN: 10.0000 [IU] | PEN_INJECTOR | Freq: Every day | SUBCUTANEOUS | 3 refills | Status: DC

## 2019-02-26 ENCOUNTER — Ambulatory Visit (INDEPENDENT_AMBULATORY_CARE_PROVIDER_SITE_OTHER): Payer: Medicaid Other

## 2019-02-26 ENCOUNTER — Encounter (HOSPITAL_COMMUNITY): Payer: Self-pay

## 2019-02-26 ENCOUNTER — Ambulatory Visit (HOSPITAL_COMMUNITY)
Admission: EM | Admit: 2019-02-26 | Discharge: 2019-02-26 | Disposition: A | Discharge status: Home / Self Care | Payer: Medicaid Other | Attending: Family Medicine | Admitting: Family Medicine

## 2019-02-26 DIAGNOSIS — K5900 Constipation, unspecified: Secondary | ICD-10-CM

## 2019-02-26 DIAGNOSIS — Z3202 Encounter for pregnancy test, result negative: Secondary | ICD-10-CM | POA: Diagnosis not present

## 2019-02-26 LAB — POCT URINALYSIS DIP (DEVICE)
Bilirubin Urine: NEGATIVE
Glucose, UA: NEGATIVE mg/dL
Ketones, ur: NEGATIVE mg/dL
Leukocytes,Ua: NEGATIVE
Nitrite: NEGATIVE
Protein, ur: NEGATIVE mg/dL
Specific Gravity, Urine: 1.025 (ref 1.005–1.030)
Urobilinogen, UA: 0.2 mg/dL (ref 0.0–1.0)
pH: 7 (ref 5.0–8.0)

## 2019-02-26 LAB — POCT PREGNANCY, URINE: Preg Test, Ur: NEGATIVE

## 2019-02-26 MED ORDER — POLYETHYLENE GLYCOL 3350 17 GM/SCOOP PO POWD: 1.0000 | Freq: Once | ORAL | 0 refills | Status: AC

## 2019-02-26 MED ORDER — BUDESONIDE-FORMOTEROL FUMARATE 80-4.5 MCG/ACT IN AERO: INHALATION_SPRAY | RESPIRATORY_TRACT | 3 refills | Status: DC

## 2019-02-26 MED ORDER — INSULIN GLARGINE 100 UNITS/ML SOLOSTAR PEN: 10.0000 [IU] | PEN_INJECTOR | Freq: Every day | SUBCUTANEOUS | 3 refills | Status: DC

## 2019-02-26 MED ORDER — FLUTICASONE-SALMETEROL 100-50 MCG/DOSE IN AEPB: 1.0000 | INHALATION_SPRAY | Freq: Every day | RESPIRATORY_TRACT | 1 refills | Status: DC
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
## 2019-02-26 NOTE — ED Triage Notes (Signed)
Pt presents with generalized abdominal pain and flank pain for past few days.

## 2019-02-26 NOTE — Discharge Instructions (Signed)
I believe your symptoms are related to constipation. Your x-ray showed a large amount of stool in the colon. Like for you to do MiraLAX 1 scoop 3 times a day until you get a good bowel movement You can then back off to once a day If your symptoms continue or worsen despite this over the next couple days you need to go to the hospital.

## 2019-03-01 ENCOUNTER — Other Ambulatory Visit: Payer: Self-pay | Admitting: Family Medicine

## 2019-03-03 ENCOUNTER — Other Ambulatory Visit: Payer: Self-pay | Admitting: Family Medicine

## 2019-03-03 MED ORDER — LANTUS SOLOSTAR 100 UNIT/ML ~~LOC~~ SOPN: 10.0000 [IU] | PEN_INJECTOR | Freq: Every day | SUBCUTANEOUS | 0 refills | Status: DC

## 2019-03-03 MED ORDER — INSULIN GLARGINE 100 UNITS/ML SOLOSTAR PEN: 10.0000 [IU] | PEN_INJECTOR | Freq: Every day | SUBCUTANEOUS | 0 refills | Status: DC

## 2019-03-03 MED FILL — !LANTUS 100 UNITS/ML VIAL: 100 | 28 days supply | Qty: 10 | Fill #0 | Status: TO

## 2019-03-04 ENCOUNTER — Ambulatory Visit: Payer: Medicaid Other

## 2019-03-09 ENCOUNTER — Ambulatory Visit: Payer: Medicaid Other | Admitting: Obstetrics and Gynecology

## 2019-03-17 ENCOUNTER — Other Ambulatory Visit: Payer: Self-pay | Admitting: Nurse Practitioner

## 2019-03-17 DIAGNOSIS — IMO0002 Reserved for concepts with insufficient information to code with codable children: Secondary | ICD-10-CM

## 2019-03-17 DIAGNOSIS — E1165 Type 2 diabetes mellitus with hyperglycemia: Secondary | ICD-10-CM

## 2019-03-18 ENCOUNTER — Other Ambulatory Visit: Payer: Self-pay

## 2019-03-18 ENCOUNTER — Ambulatory Visit: Payer: Medicaid Other | Attending: Family Medicine | Admitting: Physician Assistant

## 2019-03-18 ENCOUNTER — Other Ambulatory Visit: Payer: Self-pay | Admitting: Physician Assistant

## 2019-03-18 VITALS — BP 125/80 | HR 117 | Temp 98.0°F | Ht 62.0 in | Wt 221.0 lb

## 2019-03-18 DIAGNOSIS — F419 Anxiety disorder, unspecified: Secondary | ICD-10-CM | POA: Diagnosis not present

## 2019-03-18 DIAGNOSIS — J4520 Mild intermittent asthma, uncomplicated: Secondary | ICD-10-CM

## 2019-03-18 DIAGNOSIS — I10 Essential (primary) hypertension: Secondary | ICD-10-CM

## 2019-03-18 DIAGNOSIS — Z3202 Encounter for pregnancy test, result negative: Secondary | ICD-10-CM | POA: Diagnosis not present

## 2019-03-18 DIAGNOSIS — E119 Type 2 diabetes mellitus without complications: Secondary | ICD-10-CM

## 2019-03-18 DIAGNOSIS — Z794 Long term (current) use of insulin: Secondary | ICD-10-CM | POA: Diagnosis not present

## 2019-03-18 DIAGNOSIS — R1013 Epigastric pain: Secondary | ICD-10-CM | POA: Diagnosis not present

## 2019-03-18 DIAGNOSIS — Z09 Encounter for follow-up examination after completed treatment for conditions other than malignant neoplasm: Secondary | ICD-10-CM

## 2019-03-18 DIAGNOSIS — E559 Vitamin D deficiency, unspecified: Secondary | ICD-10-CM

## 2019-03-18 DIAGNOSIS — E1165 Type 2 diabetes mellitus with hyperglycemia: Secondary | ICD-10-CM

## 2019-03-18 DIAGNOSIS — IMO0002 Reserved for concepts with insufficient information to code with codable children: Secondary | ICD-10-CM

## 2019-03-18 LAB — POCT GLYCOSYLATED HEMOGLOBIN (HGB A1C): HbA1c, POC (controlled diabetic range): 5.9 % (ref 0.0–7.0)

## 2019-03-18 LAB — POCT URINALYSIS DIP (CLINITEK)
Bilirubin, UA: NEGATIVE
Glucose, UA: NEGATIVE mg/dL
Ketones, POC UA: NEGATIVE mg/dL
Leukocytes, UA: NEGATIVE
Nitrite, UA: NEGATIVE
POC PROTEIN,UA: NEGATIVE
Spec Grav, UA: >=1.03 — AB (ref 1.010–1.025)
Urobilinogen, UA: 0.2 E.U./dL
pH, UA: 5 (ref 5.0–8.0)

## 2019-03-18 LAB — POCT URINE PREGNANCY: Preg Test, Ur: NEGATIVE

## 2019-03-18 LAB — GLUCOSE, POCT (MANUAL RESULT ENTRY): POC Glucose: 115 mg/dl — AB (ref 70–99)

## 2019-03-18 MED ORDER — BUSPIRONE HCL 15 MG PO TABS: 15.0000 mg | ORAL_TABLET | Freq: Two times a day (BID) | ORAL | 3 refills | Status: DC

## 2019-03-18 MED ORDER — PROMETHAZINE HCL 25 MG PO TABS: 25.0000 mg | ORAL_TABLET | Freq: Four times a day (QID) | ORAL | 0 refills | Status: DC | PRN

## 2019-03-18 MED ORDER — LANTUS SOLOSTAR 100 UNIT/ML ~~LOC~~ SOPN: 10.0000 [IU] | PEN_INJECTOR | Freq: Every day | SUBCUTANEOUS | 5 refills | Status: DC

## 2019-03-18 MED ORDER — ALBUTEROL SULFATE HFA 108 (90 BASE) MCG/ACT IN AERS: 2.0000 | INHALATION_SPRAY | Freq: Four times a day (QID) | RESPIRATORY_TRACT | 0 refills | Status: DC | PRN

## 2019-03-18 MED ORDER — GABAPENTIN 400 MG PO CAPS: 800.0000 mg | ORAL_CAPSULE | Freq: Three times a day (TID) | ORAL | 5 refills | Status: DC

## 2019-03-18 MED ORDER — BUDESONIDE-FORMOTEROL FUMARATE 80-4.5 MCG/ACT IN AERO: INHALATION_SPRAY | RESPIRATORY_TRACT | 3 refills | Status: DC

## 2019-03-18 MED ORDER — SITAGLIPTIN PHOSPHATE 50 MG PO TABS: 50.0000 mg | ORAL_TABLET | Freq: Every day | ORAL | 5 refills | Status: DC

## 2019-03-18 MED ORDER — OMEPRAZOLE 40 MG PO CPDR: 40.0000 mg | DELAYED_RELEASE_CAPSULE | Freq: Two times a day (BID) | ORAL | 3 refills | Status: DC

## 2019-03-18 MED ORDER — HYDROCHLOROTHIAZIDE 25 MG PO TABS: 25.0000 mg | ORAL_TABLET | Freq: Every day | ORAL | 3 refills | Status: DC

## 2019-03-18 MED ORDER — FLUTICASONE-SALMETEROL 100-50 MCG/DOSE IN AEPB: 1.0000 | INHALATION_SPRAY | Freq: Every day | RESPIRATORY_TRACT | 1 refills | Status: DC

## 2019-03-18 MED ORDER — VICTOZA 18 MG/3ML ~~LOC~~ SOPN: PEN_INJECTOR | SUBCUTANEOUS | 2 refills | Status: DC

## 2019-03-18 MED ORDER — AMLODIPINE BESYLATE 10 MG PO TABS: 10.0000 mg | ORAL_TABLET | Freq: Every day | ORAL | 5 refills | Status: DC

## 2019-03-18 MED ORDER — PAROXETINE HCL 40 MG PO TABS: 40.0000 mg | ORAL_TABLET | Freq: Every day | ORAL | 3 refills | Status: DC

## 2019-03-18 MED ORDER — "INSULIN SYRINGE-NEEDLE U-100 25G X 5/8"" 1 ML MISC": 3 refills | Status: DC

## 2019-03-18 NOTE — Patient Instructions (Signed)
Drink 80-100 ounces of water daily.  Your labs show dehydration.

## 2019-03-18 NOTE — Progress Notes (Signed)
Patient is having pain in stomach and lower back.  Patient states that when she eat it hurts very bad.

## 2019-03-18 NOTE — Progress Notes (Signed)
Patient ID: Lori Ford, female   DOB: [birth year], 43 y.o.   MRN: [id number]   Lori Ford, is a 43 y.o. female  [id number]  [id number]  DOB - 1975/08/10  Subjective:  Chief Complaint and HPI: Lori Ford is a 43 y.o. female here today with multiple issues.  Most acutely that she has been having midepigastric pain for about 1 month.  Pain is constant and not getting better.  Seen at ED 02/26/2019 and diagnosed with constipation but now having regular BMs and pain is unchanged.  STD testing was negative.  Patient is HIV+ and on meds.  Some nausea and vomiting. This has been mild.  Feels hungry but food makes pain worse.  No urinary s/sx.  No vaginal discharge.  Patient is seeing gyn next week for pap and fibroids.  No fevers.  Monogamous with husband.    Also having a lot of anxiety.  Previously seen at [facility] and has been out of alprazalam for a while.  She is interested in a non-scheduled med for this.    She has had her gallbladder removed.   Also needs Diabetes check up.  Blood sugars running <150.  Denies hyper/hypoglycemia   ROS:   Constitutional:  No f/c, No night sweats, No unexplained weight loss. EENT:  No vision changes, No blurry vision, No hearing changes. No mouth, throat, or ear problems.  Respiratory: No cough, No SOB Cardiac: No CP, no palpitations GI:  + abd pain, some N/V no C/D. GU: No Urinary s/sx Musculoskeletal: No joint pain Neuro: No headache, no dizziness, no motor weakness.  Skin: No rash Endocrine:  No polydipsia. No polyuria.  Psych: Denies SI/HI  No problems updated.  ALLERGIES: Allergies  Allergen Reactions  . Ibuprofen Shortness Of Breath    wheezing  . Penicillins Anaphylaxis  . Zofran [Ondansetron] Nausea And Vomiting    Violent and uncontrolled  . Bee Venom Swelling  . Clindamycin/Lincomycin Hives  . Coconut Flavor Hives  . Doxycycline Hyclate     Shortness of breath  . Flagyl [Metronidazole] Other (See Comments)    "it gave  me a real bad bacterial infection"  . Latex Other (See Comments)    Rash, wheezing  . Other Swelling    "GRAPE SUBSTANCE"  . Tylenol [Acetaminophen]   . Aspirin Palpitations    wheezing  . Eggs Or Egg-Derived Products     PAST MEDICAL HISTORY: Past Medical History:  Diagnosis Date  . Allergic rhinitis   . Arthritis   . Asthma    as child  . Cervical cancer ([place]) 2006  . Chronic back pain   . Cigarette nicotine dependence with withdrawal   . Diabetes mellitus without complication ([place])   . DVT (deep venous thrombosis) ([place])   . Fibromyalgia   . Gallstones    s/p cholecystectomy  . GERD (gastroesophageal reflux disease)   . HIV (human immunodeficiency virus infection) ([place])   . HTN (hypertension)   . Meningioma ([place])   . Migraine   . Sciatic pain   . Seizure disorder, grand mal ([place])    dx 2005  . Seizures ([place])    due to head trauma as adult    MEDICATIONS AT HOME: Prior to Admission medications   Medication Sig Start Date End Date Taking? Authorizing Provider  albuterol (VENTOLIN HFA) 108 (90 Base) MCG/ACT inhaler Inhale 2 puffs into the lungs every 6 (six) hours as needed for wheezing or shortness of breath. 03/18/19  Yes Argentina Donovan, PA-C  alprazolam Duanne Moron) 2 MG tablet  08/28/16  Yes [provider]  amLODipine (NORVASC) 10 MG tablet Take 1 tablet (10 mg total) by mouth daily. 03/18/19  Yes Argentina Donovan, PA-C  bictegravir-emtricitabine-tenofovir AF (BIKTARVY) 50-200-25 MG TABS tablet Take 1 tablet by mouth daily. 02/17/19  Yes Golden Circle, FNP  Blood Pressure Monitor DEVI 1 Units by Does not apply route 3 (three) times daily. 09/08/18  Yes Kerin Perna, NP  budesonide-formoterol (SYMBICORT) 80-4.5 MCG/ACT inhaler INHALE 2 PUFFS INTO THE LUNGS 2 TIMES DAILY 03/18/19  Yes Argentina Donovan, PA-C  butalbital-acetaminophen-caffeine (FIORICET) 50-325-40 MG tablet Take 1 tablet by mouth daily as needed for headache.   Yes [provider]   cyclobenzaprine (FLEXERIL) 10 MG tablet Take 1 tablet (10 mg total) by mouth 2 (two) times daily as needed for muscle spasms. 04/20/18  Yes Maudie Flakes, MD  divalproex (DEPAKOTE ER) 500 MG 24 hr tablet Take 1,000 mg by mouth 2 (two) times daily.    Yes [provider]  Erenumab-aooe 70 MG/ML SOAJ Inject into the skin. 02/02/19  Yes [provider]  Fluticasone-Salmeterol (ADVAIR DISKUS) 100-50 MCG/DOSE AEPB Inhale 1 puff into the lungs daily. 03/18/19  Yes Freeman Caldron M, PA-C  folic acid (FOLVITE) 1 MG tablet Take 1 mg by mouth daily.   Yes [provider]  gabapentin (NEURONTIN) 400 MG capsule Take 2 capsules (800 mg total) by mouth 3 (three) times daily. 03/18/19  Yes Freeman Caldron M, PA-C  hydrochlorothiazide (HYDRODIURIL) 25 MG tablet Take 1 tablet (25 mg total) by mouth daily. 03/18/19  Yes Ibeth Fahmy M, PA-C  Insulin Glargine (LANTUS SOLOSTAR) 100 UNIT/ML Solostar Pen Inject 10 Units into the skin daily. 03/18/19  Yes Freeman Caldron M, PA-C  Insulin Syringe-Needle U-100 25G X 5/8" 1 ML MISC With use of lantus administration 03/18/19  Yes Grizelda Piscopo, Dionne Bucy, PA-C  Lancets (ACCU-CHEK SOFT TOUCH) lancets Use as instructed 11/21/18  Yes Burky, Lanelle Bal B, NP  liraglutide (VICTOZA) 18 MG/3ML SOPN INJECT 1.2 MG SUB-Q ONCE DAILY 03/18/19  Yes Vivienne Sangiovanni M, PA-C  loratadine (CLARITIN) 10 MG tablet Take 1 tablet (10 mg total) by mouth daily. 04/20/18  Yes Maudie Flakes, MD  mirtazapine (REMERON) 15 MG tablet Take 15 mg by mouth at bedtime. 07/04/16  Yes [provider]  omeprazole (PRILOSEC) 40 MG capsule Take 1 capsule (40 mg total) by mouth 2 (two) times daily. 03/18/19  Yes Freeman Caldron M, PA-C  Oxycodone HCl 10 MG TABS TK 1 T PO Q 8 H 01/22/19  Yes [provider]  PARoxetine (PAXIL) 40 MG tablet Take 1 tablet (40 mg total) by mouth daily. 03/18/19  Yes Stepan Verrette M, PA-C  PATADAY 0.2 % SOLN Place 1 drop into both eyes daily. 07/31/18  Yes  [provider]  predniSONE (DELTASONE) 20 MG tablet Take 3 pills today then 2 pills daily for 2 days, 1 pill for 2 days then 1/2 pill for 2 days; take after eating 02/18/19  Yes Fulp, Cammie, MD  promethazine (PHENERGAN) 25 MG tablet Take 1 tablet (25 mg total) by mouth every 6 (six) hours as needed for nausea or vomiting. 01/11/19  Yes Isla Pence, MD  propranolol (INDERAL) 20 MG tablet Take 20 mg by mouth daily.   Yes [provider]  Respiratory Therapy Supplies (NEBULIZER) DEVI Nebulizer machine Dispense #1 Diagnosis: COPD 09/07/18  Yes Hagler, Aaron Edelman, MD  sitaGLIPtin (JANUVIA) 50 MG tablet Take 1 tablet (50 mg  total) by mouth daily. 03/18/19  Yes Freeman Caldron M, PA-C  sodium chloride (OCEAN) 0.65 % nasal spray Place 1 spray into the nose 2 (two) times daily as needed for congestion.    Yes [provider]  triamcinolone cream (KENALOG) 0.1 % Apply 1 application topically 2 (two) times daily. Patient taking differently: Apply 1 application topically 2 (two) times daily as needed (rash, itching).  09/07/18  Yes Hagler, Aaron Edelman, MD  triamcinolone ointment (KENALOG) 0.5 % Apply 1 application topically 2 (two) times daily. 09/07/18  Yes Hagler, Aaron Edelman, MD  Vitamin D, Ergocalciferol, (DRISDOL) 50000 units CAPS capsule Take 50,000 Units by mouth every 7 (seven) days. THURSDAYS 07/04/16  Yes [provider]  ACCU-CHEK AVIVA PLUS test strip USE AS INSTRUCTED 03/01/19   Fulp, Cammie, MD  busPIRone (BUSPAR) 15 MG tablet Take 1 tablet (15 mg total) by mouth 2 (two) times daily. 03/18/19   Argentina Donovan, PA-C  clonazePAM (KLONOPIN) 1 MG tablet Take 1 mg by mouth 2 (two) times a day. 08/24/18   [provider]  levETIRAcetam (KEPPRA) 500 MG tablet Take 1 tablet (500 mg total) by mouth 2 (two) times daily for 14 days. After 2 weeks increase to 2 tablets twice per day by mouth. 05/18/18 02/18/19  Gildardo Pounds, NP     Objective:  EXAM:   Vitals:   03/18/19  0844  BP: 125/80  Pulse: (!) 117  Temp: 98 F (36.7 C)  TempSrc: Oral  SpO2: 97%  Weight: 221 lb (100.2 kg)  Height: 5\' 2"  (1.575 m)    General appearance : A&OX3. NAD. Non-toxic-appearing, patient rocks back and forth constantly while talking and during exam HEENT: Atraumatic and Normocephalic.  PERRLA. EOM intact.  Neck: supple, no JVD. No cervical lymphadenopathy. No thyromegaly Chest/Lungs:  Breathing-non-labored, Good air entry bilaterally, breath sounds normal without rales, rhonchi, or wheezing  CVS: S1 S2 regular, no murmurs, gallops, rubs  Abdomen: Bowel sounds present, Non tender and not distended with no gaurding, rigidity or rebound.  Abdomen non-acute and no TTP with distraction Extremities: Bilateral Lower Ext shows no edema, both legs are warm to touch with = pulse throughout Neurology:  CN II-XII grossly intact, Non focal.   Psych:  TP linear. J/I WNL. Normal speech. Appropriate eye contact and affect.  Skin:  No Rash  Data Review Lab Results  Component Value Date   HGBA1C 5.9 03/18/2019   HGBA1C 5.5 05/06/2018   HGBA1C 6.2 (H) 09/30/2014     Assessment & Plan   1. Type 2 diabetes mellitus without complication, with long-term current use of insulin (HCC) Almost at goal.  Continue current regimen, work on diet - Glucose (CBG) - POCT glycosylated hemoglobin (Hb A1C)  2. Midepigastric pain Non acute abdomen currently.  To ED if worsens.  Increase water intake as urine shows dehydration - H. pylori breath test - Comprehensive metabolic panel - CBC with Differential - Lipase - POCT URINALYSIS DIP (CLINITEK) - POCT urine pregnancy - omeprazole (PRILOSEC) 40 MG capsule; Take 1 capsule (40 mg total) by mouth 2 (two) times daily.  Dispense: 60 capsule; Refill: 3  3. Intermittent asthma, unspecified asthma severity, unspecified whether complicated - Fluticasone-Salmeterol (ADVAIR DISKUS) 100-50 MCG/DOSE AEPB; Inhale 1 puff into the lungs daily.  Dispense: 60  each; Refill: 1  (patient insists that she is supposed to be on symbicort and advair).  Smoking cessation also discussed.   - albuterol (VENTOLIN HFA) 108 (90 Base) MCG/ACT inhaler; Inhale 2 puffs into  the lungs every 6 (six) hours as needed for wheezing or shortness of breath.  Dispense: 18 g; Refill: 0 - budesonide-formoterol (SYMBICORT) 80-4.5 MCG/ACT inhaler; INHALE 2 PUFFS INTO THE LUNGS 2 TIMES DAILY  Dispense: 10.2 g; Refill: 3  4. Anxiety Can try adding buspar - PARoxetine (PAXIL) 40 MG tablet; Take 1 tablet (40 mg total) by mouth daily.  Dispense: 30 tablet; Refill: 3 - busPIRone (BUSPAR) 15 MG tablet; Take 1 tablet (15 mg total) by mouth 2 (two) times daily.  Dispense: 60 tablet; Refill: 3 - Ambulatory referral to Social Work  5. Diabetes mellitus type 2, uncontrolled, with complications (HCC) - gabapentin (NEURONTIN) 400 MG capsule; Take 2 capsules (800 mg total) by mouth 3 (three) times daily.  Dispense: 90 capsule; Refill: 5 - sitaGLIPtin (JANUVIA) 50 MG tablet; Take 1 tablet (50 mg total) by mouth daily.  Dispense: 30 tablet; Refill: 5 - liraglutide (VICTOZA) 18 MG/3ML SOPN; INJECT 1.2 MG SUB-Q ONCE DAILY  Dispense: 3 pen; Refill: 2 - Insulin Glargine (LANTUS SOLOSTAR) 100 UNIT/ML Solostar Pen; Inject 10 Units into the skin daily.  Dispense: 15 mL; Refill: 5 - Insulin Syringe-Needle U-100 25G X 5/8" 1 ML MISC; With use of lantus administration  Dispense: 100 each; Refill: 3 I have had a lengthy discussion and provided education about insulin resistance and the intake of too much sugar/refined carbohydrates.  I have advised the patient to work at a goal of eliminating sugary drinks, candy, desserts, sweets, refined sugars, processed foods, and white carbohydrates.  The patient expresses understanding.    6. Essential hypertension We have discussed target BP range and blood pressure goal. I have advised patient to check BP regularly and to call us back or report to clinic if the  numbers are consistently higher than 140/90. We discussed the importance of compliance with medical therapy and DASH diet recommended, consequences of uncontrolled hypertension discussed.  - amLODipine (NORVASC) 10 MG tablet; Take 1 tablet (10 mg total) by mouth daily.  Dispense: 30 tablet; Refill: 5 - hydrochlorothiazide (HYDRODIURIL) 25 MG tablet; Take 1 tablet (25 mg total) by mouth daily.  Dispense: 30 tablet; Refill: 3  7. Vitamin D deficiency - Vitamin D, 25-hydroxy  8. Encounter for examination following treatment at hospital No changes    Patient have been counseled extensively about nutrition and exercise  Return in about 1 month (around 04/18/2019) for with PCP to recheck abdominal pain and anxiety.  The patient was given clear instructions to go to ER or return to medical center if symptoms don't improve, worsen or new problems develop. The patient verbalized understanding. The patient was told to call to get lab results if they haven't heard anything in the next week.     Freeman Caldron, PA-C [facility] and [facility] [place], [place]   03/18/2019, 9:59 AM

## 2019-03-19 LAB — COMPREHENSIVE METABOLIC PANEL
ALT: 12 IU/L (ref 0–32)
AST: 16 IU/L (ref 0–40)
Albumin/Globulin Ratio: 1.4 (ref 1.2–2.2)
Albumin: 4.1 g/dL (ref 3.8–4.8)
Alkaline Phosphatase: 81 IU/L (ref 39–117)
BUN/Creatinine Ratio: 8 — ABNORMAL LOW (ref 9–23)
BUN: 8 mg/dL (ref 6–24)
Bilirubin Total: 0.3 mg/dL (ref 0.0–1.2)
CO2: 24 mmol/L (ref 20–29)
Calcium: 9.4 mg/dL (ref 8.7–10.2)
Chloride: 99 mmol/L (ref 96–106)
Creatinine, Ser: 1 mg/dL (ref 0.57–1.00)
GFR calc Af Amer: 81 mL/min/{1.73_m2} (ref >59)
GFR calc non Af Amer: 70 mL/min/{1.73_m2} (ref >59)
Globulin, Total: 3 g/dL (ref 1.5–4.5)
Glucose: 85 mg/dL (ref 65–99)
Potassium: 3.8 mmol/L (ref 3.5–5.2)
Sodium: 137 mmol/L (ref 134–144)
Total Protein: 7.1 g/dL (ref 6.0–8.5)

## 2019-03-19 LAB — CBC WITH DIFFERENTIAL/PLATELET
Basophils Absolute: 0 10*3/uL (ref 0.0–0.2)
Basos: 1 %
EOS (ABSOLUTE): 0 10*3/uL (ref 0.0–0.4)
Eos: 0 %
Hematocrit: 43.6 % (ref 34.0–46.6)
Hemoglobin: 14.7 g/dL (ref 11.1–15.9)
Immature Grans (Abs): 0 10*3/uL (ref 0.0–0.1)
Immature Granulocytes: 0 %
Lymphocytes Absolute: 3 10*3/uL (ref 0.7–3.1)
Lymphs: 59 %
MCH: 31.1 pg (ref 26.6–33.0)
MCHC: 33.7 g/dL (ref 31.5–35.7)
MCV: 92 fL (ref 79–97)
Monocytes Absolute: 0.4 10*3/uL (ref 0.1–0.9)
Monocytes: 7 %
Neutrophils Absolute: 1.7 10*3/uL (ref 1.4–7.0)
Neutrophils: 33 %
Platelets: 210 10*3/uL (ref 150–450)
RBC: 4.73 x10E6/uL (ref 3.77–5.28)
RDW: 13.2 % (ref 11.7–15.4)
WBC: 5.1 10*3/uL (ref 3.4–10.8)

## 2019-03-19 LAB — VITAMIN D 25 HYDROXY (VIT D DEFICIENCY, FRACTURES): Vit D, 25-Hydroxy: 30.7 ng/mL (ref 30.0–100.0)

## 2019-03-19 LAB — H. PYLORI BREATH TEST: H pylori Breath Test: NEGATIVE

## 2019-03-19 LAB — LIPASE: Lipase: 22 U/L (ref 14–72)

## 2019-03-22 ENCOUNTER — Other Ambulatory Visit: Payer: Self-pay

## 2019-03-22 ENCOUNTER — Emergency Department (HOSPITAL_COMMUNITY): Payer: Medicaid Other

## 2019-03-22 ENCOUNTER — Emergency Department (HOSPITAL_COMMUNITY)
Admission: EM | Admit: 2019-03-22 | Discharge: 2019-03-22 | Disposition: A | Discharge status: Home / Self Care | Payer: Medicaid Other | Attending: Emergency Medicine | Admitting: Emergency Medicine

## 2019-03-22 DIAGNOSIS — Z21 Asymptomatic human immunodeficiency virus [HIV] infection status: Secondary | ICD-10-CM | POA: Diagnosis not present

## 2019-03-22 DIAGNOSIS — I10 Essential (primary) hypertension: Secondary | ICD-10-CM | POA: Insufficient documentation

## 2019-03-22 DIAGNOSIS — R109 Unspecified abdominal pain: Secondary | ICD-10-CM | POA: Insufficient documentation

## 2019-03-22 DIAGNOSIS — Z794 Long term (current) use of insulin: Secondary | ICD-10-CM | POA: Insufficient documentation

## 2019-03-22 DIAGNOSIS — Z91040 Latex allergy status: Secondary | ICD-10-CM | POA: Insufficient documentation

## 2019-03-22 DIAGNOSIS — F17210 Nicotine dependence, cigarettes, uncomplicated: Secondary | ICD-10-CM | POA: Insufficient documentation

## 2019-03-22 DIAGNOSIS — R569 Unspecified convulsions: Secondary | ICD-10-CM | POA: Insufficient documentation

## 2019-03-22 DIAGNOSIS — G8384 Todd's paralysis (postepileptic): Secondary | ICD-10-CM | POA: Insufficient documentation

## 2019-03-22 DIAGNOSIS — R112 Nausea with vomiting, unspecified: Secondary | ICD-10-CM | POA: Diagnosis not present

## 2019-03-22 DIAGNOSIS — G40909 Epilepsy, unspecified, not intractable, without status epilepticus: Secondary | ICD-10-CM

## 2019-03-22 DIAGNOSIS — Z5320 Procedure and treatment not carried out because of patient's decision for unspecified reasons: Secondary | ICD-10-CM | POA: Insufficient documentation

## 2019-03-22 DIAGNOSIS — F445 Conversion disorder with seizures or convulsions: Secondary | ICD-10-CM

## 2019-03-22 DIAGNOSIS — Z86011 Personal history of benign neoplasm of the brain: Secondary | ICD-10-CM | POA: Diagnosis not present

## 2019-03-22 DIAGNOSIS — J45909 Unspecified asthma, uncomplicated: Secondary | ICD-10-CM | POA: Diagnosis not present

## 2019-03-22 DIAGNOSIS — Z20828 Contact with and (suspected) exposure to other viral communicable diseases: Secondary | ICD-10-CM | POA: Insufficient documentation

## 2019-03-22 DIAGNOSIS — Z79899 Other long term (current) drug therapy: Secondary | ICD-10-CM | POA: Diagnosis not present

## 2019-03-22 DIAGNOSIS — F419 Anxiety disorder, unspecified: Secondary | ICD-10-CM | POA: Diagnosis not present

## 2019-03-22 DIAGNOSIS — Z8541 Personal history of malignant neoplasm of cervix uteri: Secondary | ICD-10-CM | POA: Insufficient documentation

## 2019-03-22 DIAGNOSIS — E119 Type 2 diabetes mellitus without complications: Secondary | ICD-10-CM | POA: Insufficient documentation

## 2019-03-22 LAB — I-STAT CHEM 8, ED
BUN: 6 mg/dL (ref 6–20)
Calcium, Ion: 1.13 mmol/L — ABNORMAL LOW (ref 1.15–1.40)
Chloride: 102 mmol/L (ref 98–111)
Creatinine, Ser: 0.7 mg/dL (ref 0.44–1.00)
Glucose, Bld: 107 mg/dL — ABNORMAL HIGH (ref 70–99)
HCT: 39 % (ref 36.0–46.0)
Hemoglobin: 13.3 g/dL (ref 12.0–15.0)
Potassium: 3.5 mmol/L (ref 3.5–5.1)
Sodium: 138 mmol/L (ref 135–145)
TCO2: 27 mmol/L (ref 22–32)

## 2019-03-22 LAB — CBC
HCT: 39.9 % (ref 36.0–46.0)
Hemoglobin: 13.4 g/dL (ref 12.0–15.0)
MCH: 31.6 pg (ref 26.0–34.0)
MCHC: 33.6 g/dL (ref 30.0–36.0)
MCV: 94.1 fL (ref 80.0–100.0)
Platelets: 202 10*3/uL (ref 150–400)
RBC: 4.24 MIL/uL (ref 3.87–5.11)
RDW: 13.8 % (ref 11.5–15.5)
WBC: 5.5 10*3/uL (ref 4.0–10.5)
nRBC: 0 % (ref 0.0–0.2)

## 2019-03-22 LAB — COMPREHENSIVE METABOLIC PANEL
ALT: 14 U/L (ref 0–44)
AST: 15 U/L (ref 15–41)
Albumin: 3.3 g/dL — ABNORMAL LOW (ref 3.5–5.0)
Alkaline Phosphatase: 56 U/L (ref 38–126)
Anion gap: 9 (ref 5–15)
BUN: 6 mg/dL (ref 6–20)
CO2: 24 mmol/L (ref 22–32)
Calcium: 8.9 mg/dL (ref 8.9–10.3)
Chloride: 105 mmol/L (ref 98–111)
Creatinine, Ser: 0.85 mg/dL (ref 0.44–1.00)
GFR calc Af Amer: >60 mL/min (ref >60)
GFR calc non Af Amer: >60 mL/min (ref >60)
Glucose, Bld: 109 mg/dL — ABNORMAL HIGH (ref 70–99)
Potassium: 3.6 mmol/L (ref 3.5–5.1)
Sodium: 138 mmol/L (ref 135–145)
Total Bilirubin: 0.4 mg/dL (ref 0.3–1.2)
Total Protein: 6.6 g/dL (ref 6.5–8.1)

## 2019-03-22 LAB — DIFFERENTIAL
Abs Immature Granulocytes: 0.02 10*3/uL (ref 0.00–0.07)
Basophils Absolute: 0 10*3/uL (ref 0.0–0.1)
Basophils Relative: 0 %
Eosinophils Absolute: 0 10*3/uL (ref 0.0–0.5)
Eosinophils Relative: 0 %
Immature Granulocytes: 0 %
Lymphocytes Relative: 51 %
Lymphs Abs: 2.7 10*3/uL (ref 0.7–4.0)
Monocytes Absolute: 0.5 10*3/uL (ref 0.1–1.0)
Monocytes Relative: 8 %
Neutro Abs: 2.3 10*3/uL (ref 1.7–7.7)
Neutrophils Relative %: 41 %

## 2019-03-22 LAB — PROTIME-INR
INR: 1 (ref 0.8–1.2)
Prothrombin Time: 13.4 seconds (ref 11.4–15.2)

## 2019-03-22 LAB — APTT: aPTT: 25 seconds (ref 24–36)

## 2019-03-22 LAB — I-STAT BETA HCG BLOOD, ED (MC, WL, AP ONLY): I-stat hCG, quantitative: <5 m[IU]/mL (ref <5)

## 2019-03-22 LAB — PHENYTOIN LEVEL, TOTAL: Phenytoin Lvl: <2.5 ug/mL — ABNORMAL LOW (ref 10.0–20.0)

## 2019-03-22 LAB — SARS CORONAVIRUS 2 (TAT 6-24 HRS): SARS Coronavirus 2: NEGATIVE

## 2019-03-22 LAB — VALPROIC ACID LEVEL: Valproic Acid Lvl: <10 ug/mL — ABNORMAL LOW (ref 50.0–100.0)

## 2019-03-22 MED ORDER — LEVETIRACETAM 500 MG PO TABS: 500.0000 mg | ORAL_TABLET | Freq: Two times a day (BID) | ORAL | 0 refills | Status: DC

## 2019-03-22 MED ORDER — DIVALPROEX SODIUM ER 500 MG PO TB24: 1000.0000 mg | ORAL_TABLET | Freq: Two times a day (BID) | ORAL | 0 refills | Status: DC

## 2019-03-22 MED ORDER — LORAZEPAM 2 MG/ML IJ SOLN
1.0000 mg | Freq: Once | INTRAMUSCULAR | Status: AC
  Administered 2019-03-22: 1 mg via INTRAVENOUS
  Filled 2019-03-22: qty 1

## 2019-03-22 MED ORDER — PROMETHAZINE HCL 25 MG/ML IJ SOLN
25.0000 mg | Freq: Once | INTRAMUSCULAR | Status: AC
  Administered 2019-03-22: 25 mg via INTRAVENOUS
  Filled 2019-03-22: qty 1

## 2019-03-22 MED ORDER — SODIUM CHLORIDE 0.9% FLUSH
3.0000 mL | Freq: Once | INTRAVENOUS | Status: AC
Start: 2019-03-22 — End: 2019-03-22
  Administered 2019-03-22: 3 mL via INTRAVENOUS

## 2019-03-22 MED ORDER — DIVALPROEX SODIUM ER 500 MG PO TB24
1000.0000 mg | ORAL_TABLET | Freq: Once | ORAL | Status: DC
  Filled 2019-03-22: qty 2

## 2019-03-22 MED ORDER — MORPHINE SULFATE (PF) 4 MG/ML IV SOLN
4.0000 mg | Freq: Once | INTRAVENOUS | Status: AC
  Administered 2019-03-22: 4 mg via INTRAVENOUS
  Filled 2019-03-22: qty 1

## 2019-03-22 MED ORDER — PHENYTOIN SODIUM EXTENDED 100 MG PO CAPS: 200.0000 mg | ORAL_CAPSULE | Freq: Three times a day (TID) | ORAL | 0 refills | Status: DC

## 2019-03-22 MED ORDER — LEVETIRACETAM 500 MG PO TABS
500.0000 mg | ORAL_TABLET | Freq: Once | ORAL | Status: DC
  Filled 2019-03-22: qty 1

## 2019-03-22 MED ORDER — SODIUM CHLORIDE 0.9 % IV BOLUS
1000.0000 mL | Freq: Once | INTRAVENOUS | Status: AC
  Administered 2019-03-22: 1000 mL via INTRAVENOUS

## 2019-03-22 NOTE — ED Notes (Addendum)
During discharge instructions pt became verbally and physically agitated and refused to continue with discharge instructions. This RN asked if the pt wanted to take her medications and she refused and stomped on the pills. Pt stated "I know how [facility] is and how it be racist against black people and it look like I was just the odd one out today".  Pt then stood up and began throwing items across the room including her paperwork, blankets, the phone. This RN was not struck and asked if she wanted a wheelchair to be let out and pt stated she would "walk out of here and fall so that she could have more reason to sue this damn place". Pt left ambulatory and refused VS reassessment, medication administration, and refused to also sign the AMA form.

## 2019-03-22 NOTE — ED Notes (Signed)
Arrived in [place] at 52

## 2019-03-22 NOTE — ED Provider Notes (Signed)
Patient is asking to leave.  Her MRI has not yet been completed.  Patient understands we could be missing ischemic stroke though it does sound like her history is more consistent with seizure/Todd's paralysis.  She tells me she has not taken Dilantin, Depakote, and Keppra in over 2 weeks due to running out and is waiting on a refill.  I will refill these and give her a dose now though they will have to be oral given she wants to leave immediately.  Otherwise, her weakness seems to be better and so stroke is less likely.  We discussed possible dilatation effects of not getting the MRI including stroke, cerebral edema, disability.   Sherwood Gambler, MD 03/22/19 [phone]

## 2019-03-22 NOTE — ED Notes (Signed)
Nurse spoke with patient regarding medications. States she would wait until she goes to MRI.

## 2019-03-22 NOTE — ED Notes (Signed)
Called MRI for update regarding MRI; no updates at this time; MRI tech will keep me updated.

## 2019-03-22 NOTE — ED Triage Notes (Signed)
Pt arrives via [facility] from home, hx of brain tumor that she is under treatment for, had seizure last night around 2100, hx of the same, woke up this am with L sided pain and weakness. LSN 2100. Also endorses abd pain x several weeks. Pt has facial droop and L sided arm weakness upon arrival that she states is new. A/ox4, speech clear.

## 2019-03-22 NOTE — Progress Notes (Signed)
EEG complete - results pending 

## 2019-03-22 NOTE — ED Notes (Signed)
MRI called stated transported will be sent for patient.

## 2019-03-22 NOTE — ED Notes (Signed)
ED doctor at bedside

## 2019-03-22 NOTE — ED Notes (Signed)
Lori Ford ([facility]/Code Stroke)called @ 1045-per Marya Amsler, RN called by Levada Dy

## 2019-03-22 NOTE — ED Notes (Signed)
Neurologist at bedside. 

## 2019-03-22 NOTE — Code Documentation (Signed)
43yo female arriving to [facility] via [place] at 1020. Patient from home where she was LKW yesterday at 2100. Patient reports having 3 seizures in a 30-minute timeframe last night. She denies awareness during this time, but reports her husband witnessed the seizures. Unclear reason for the delay in presenting to the hospital. She reports associated incontinence. left sided weakness and burning headache. Of note, patient reports a h/o brain tumor with associated left sided weakness. She reports compliance with seizure medications, however, she reports nausea and vomiting x 4 weeks. Code stroke activated in the ED. Stroke team to the bedside in CT. NIHSS 6, see documentation for details and code stroke times. Patient with left facial droop, left sided weakness, left sided decreased sensation and hypersensitivity to touch on exam. Patient is outside the window for treatment with tPA. No acute stroke treatment at this time. Code stroke canceled. Bedside handoff with ED RN Marya Amsler.
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
## 2019-03-22 NOTE — Procedures (Signed)
Patient Name: Lori Ford  MRN: [id number]  Epilepsy Attending: Lora Havens  Referring Physician/Provider: Dr Kerney Elbe Date: 03/22/2019 Duration: 23.65mins  Patient history: 43 year old female presenting with GTC seizures yesterday, followed by left sided weakness and pain after awakening from postictal state. EEG to evaluate for seizure  Level of alertness: awake, asleep  AEDs during EEG study: LEV, PHT, VPA, alprazolam  Technical aspects: This EEG study was done with scalp electrodes positioned according to the 10-20 International system of electrode placement. Electrical activity was acquired at a sampling rate of 500Hz  and reviewed with a high frequency filter of 70Hz  and a low frequency filter of 1Hz . EEG data were recorded continuously and digitally stored.   DESCRIPTION: During awake state, no clear posterior dominant rhythm was seen. Sleep was characterized by sleep spindles (12-14hz ), maximal frontocentral and intermittent generalized intermittent 2-5hz  theta-delta slowing. Hyperventilation and photic stimulation were not performed.  IMPRESSION: This study is within normal limits. No seizures or epileptiform discharges were seen throughout the recording.  Lori Ford

## 2019-03-22 NOTE — ED Notes (Signed)
Patient asked to go to bathroom, she is asking for pain med, When is she going to MRI, When am I going to get something to drink. I tried to explain, that MRI would get her ASAP, Then she asked when was she being discharged. I tried to explain to her, that I would get the RN to come in to see her. She then stated to get someone in the room.that could answer her questions now.

## 2019-03-22 NOTE — ED Notes (Signed)
Notified charge nurse, Network engineer and CT patient called a CODE STROKE.

## 2019-03-22 NOTE — ED Notes (Signed)
Patient taken back for EDP to evaluate pt r/t new facial droop and L arm weakness that began ar 2100 last night.

## 2019-03-22 NOTE — ED Notes (Signed)
EEG at bedside.

## 2019-03-22 NOTE — ED Notes (Signed)
Patient decided to take the medications now.

## 2019-03-23 NOTE — ED Notes (Signed)
Pt was made aware of unknown time for MRI; Pt stated she would like to leave AMA. Provider was made aware.

## 2019-03-23 NOTE — ED Notes (Signed)
ED Provider was made aware of pts actions and was informed of her decision to leave AMA without signing AMA Consent Form.

## 2019-03-24 ENCOUNTER — Other Ambulatory Visit (HOSPITAL_COMMUNITY)
Admission: RE | Admit: 2019-03-24 | Discharge: 2019-03-24 | Disposition: A | Discharge status: Home / Self Care | Payer: Medicaid Other | Source: Ambulatory Visit | Attending: Obstetrics and Gynecology | Admitting: Obstetrics and Gynecology

## 2019-03-24 ENCOUNTER — Encounter: Payer: Self-pay | Admitting: Obstetrics and Gynecology

## 2019-03-24 ENCOUNTER — Other Ambulatory Visit: Payer: Self-pay

## 2019-03-24 ENCOUNTER — Ambulatory Visit: Payer: Medicaid Other | Admitting: Obstetrics and Gynecology

## 2019-03-24 VITALS — BP 133/94 | HR 72 | Ht 62.0 in | Wt 222.0 lb

## 2019-03-24 DIAGNOSIS — Z124 Encounter for screening for malignant neoplasm of cervix: Secondary | ICD-10-CM

## 2019-03-24 DIAGNOSIS — N921 Excessive and frequent menstruation with irregular cycle: Secondary | ICD-10-CM

## 2019-03-24 DIAGNOSIS — D259 Leiomyoma of uterus, unspecified: Secondary | ICD-10-CM

## 2019-03-24 NOTE — Patient Instructions (Signed)
Menorrhagia Menorrhagia is when your menstrual periods are heavy or last longer than normal. Follow these instructions at home: Medicines   Take over-the-counter and prescription medicines exactly as told by your doctor. This includes iron pills.  Do not change or switch medicines without asking your doctor.  Do not take aspirin or medicines that contain aspirin 1 week before or during your period. Aspirin may make bleeding worse. General instructions  If you need to change your pad or tampon more than once every 2 hours, limit your activity until the bleeding stops.  Iron pills can cause problems when pooping (constipation). To prevent or treat pooping problems while taking prescription iron pills, your doctor may suggest that you: ? Drink enough fluid to keep your pee (urine) clear or pale yellow. ? Take over-the-counter or prescription medicines. ? Eat foods that are high in fiber. These foods include: ? Fresh fruits and vegetables. ? Whole grains. ? Beans. ? Limit foods that are high in fat and processed sugars. This includes fried and sweet foods.  Eat healthy meals and foods that are high in iron. Foods that have a lot of iron include: ? Leafy green vegetables. ? Meat. ? Liver. ? Eggs. ? Whole grain breads and cereals.  Do not try to lose weight until your heavy bleeding has stopped and you have normal amounts of iron in your blood. If you need to lose weight, work with your doctor.  Keep all follow-up visits as told by your doctor. This is important. Contact a doctor if:  You soak through a pad or tampon every 1 or 2 hours, and this happens every time you have a period.  You need to use pads and tampons at the same time because you are bleeding so much.  You are taking medicine and you: ? Feel sick to your stomach (nauseous). ? Throw up (vomit). ? Have watery poop (diarrhea).  You have other problems that may be related to the medicine you are taking. Get help  right away if:  You soak through more than a pad or tampon in 1 hour.  You pass clots bigger than 1 inch (2.5 cm) wide.  You feel short of breath.  You feel like your heart is beating too fast.  You feel dizzy or you pass out (faint).  You feel very weak or tired. Summary  Menorrhagia is when your menstrual periods are heavy or last longer than normal.  Take over-the-counter and prescription medicines exactly as told by your doctor. This includes iron pills.  Contact a doctor if you soak through more than a pad or tampon in 1 hour or are passing large clots. This information is not intended to replace advice given to you by your health care provider. Make sure you discuss any questions you have with your health care provider. Document Released: 01/09/2008 Document Revised: 07/09/2017 Document Reviewed: 04/22/2016 Elsevier Patient Education  2020 Else[employer] Inc.  

## 2019-03-24 NOTE — Addendum Note (Signed)
Addended by: Delrae Alfred on: 03/24/2019 02:52 PM   Modules accepted: Orders

## 2019-03-24 NOTE — Progress Notes (Signed)
New GYN referral from [facility] for Fibroids.

## 2019-03-24 NOTE — Progress Notes (Signed)
Patient ID: Lori Ford, female   DOB: Aug 18, 1975, 43 y.o.   MRN: [id number] Ms Pohle presents in referral for uterine fibroids and heavy cycles. Pt reports Dx with uterine fibroids by CT scan back in 2018. Cycles have been very heavy with cramps and clots, last 4-7 days, uses several pads an hour during heavy days. Has note 2 cycles a month for the last 5-6 months. Normal TSH and H & H this past September  Has used OCP's in the past but unable to tolerate Sexual active without problems or contraception Last pap uncertain Mammogram this past Jan, normal per pt report Medical problems and medications as listed, managed by PCP  Denies any bowel or bladder dysfunction, SOB or CP  TSVD x 3 , 2 fetal deaths at 8 months d/t abdominal trauma/injury, largest 6 #   Surgerical history as noted in medical records  PE AF  VSS Lungs clear Heart RRR Abd soft + BS GU nl EGBUS cervix without lesions, pap smear obtained, uterus @ 10 weeks size, mobile non tender, no adnexal masses or tenderness  A/P Uterine fibroids        Menorrhagia  Pap smear for HM today. UTD on mammogram. Discussed uterine fibroids and cycles with pt. Will check GYN U/S. Will need EMBX with follow appt. Tx options briefly reviewed with pt. F/U in 4 weeks.

## 2019-03-30 LAB — CYTOLOGY - PAP
Comment: NEGATIVE
Diagnosis: NEGATIVE
High risk HPV: NEGATIVE

## 2019-04-01 ENCOUNTER — Other Ambulatory Visit: Payer: Self-pay

## 2019-04-01 ENCOUNTER — Ambulatory Visit (HOSPITAL_COMMUNITY)
Admission: RE | Admit: 2019-04-01 | Discharge: 2019-04-01 | Disposition: A | Discharge status: Home / Self Care | Payer: Medicaid Other | Source: Ambulatory Visit | Attending: Obstetrics and Gynecology | Admitting: Obstetrics and Gynecology

## 2019-04-01 DIAGNOSIS — D259 Leiomyoma of uterus, unspecified: Secondary | ICD-10-CM | POA: Diagnosis present

## 2019-04-01 DIAGNOSIS — N921 Excessive and frequent menstruation with irregular cycle: Secondary | ICD-10-CM

## 2019-04-12 ENCOUNTER — Other Ambulatory Visit: Payer: Self-pay | Admitting: Family Medicine

## 2019-04-12 ENCOUNTER — Other Ambulatory Visit: Payer: Self-pay | Admitting: Physician Assistant

## 2019-04-12 DIAGNOSIS — J4520 Mild intermittent asthma, uncomplicated: Secondary | ICD-10-CM

## 2019-04-13 ENCOUNTER — Ambulatory Visit: Payer: Medicaid Other | Admitting: Family

## 2019-04-17 ENCOUNTER — Ambulatory Visit (HOSPITAL_COMMUNITY)
Admission: EM | Admit: 2019-04-17 | Discharge: 2019-04-17 | Disposition: A | Discharge status: Home / Self Care | Payer: Medicaid Other

## 2019-04-17 ENCOUNTER — Other Ambulatory Visit: Payer: Self-pay
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
## 2019-04-20 ENCOUNTER — Encounter: Payer: Self-pay | Admitting: Family

## 2019-04-20 NOTE — Progress Notes (Signed)
Patient ID: Lori Ford, female   DOB: 1976/04/08, 44 y.o.   MRN: [id number] Working Viral Load List  Called patient left message to call for appointment

## 2019-04-21 ENCOUNTER — Telehealth: Payer: Self-pay | Admitting: Family Medicine

## 2019-04-21 ENCOUNTER — Ambulatory Visit: Payer: Medicaid Other | Admitting: Family Medicine

## 2019-04-21 ENCOUNTER — Other Ambulatory Visit: Payer: Self-pay

## 2019-04-21 ENCOUNTER — Ambulatory Visit: Payer: Medicaid Other | Admitting: Obstetrics and Gynecology

## 2019-04-21 NOTE — Telephone Encounter (Signed)
Patient called in the office upset that she answered the covid question and we changed the appointment to a telehealth. Patient called Rolm Gala " stupid little mother fucker". And called me a" bitch". Patient stated  "how the fuck I got covid my mother has fucking covid and she lives 4 fucking hours away in [place]". I asked the patient to calm down and not use these words patient said to me "fuck you I will say what the fuck I want to say". I hung up the phone. Patient is schedule for a in person appointment tomorrow 04/22/2019 at 3:10 with Dr. Chapman Fitch

## 2019-04-22 ENCOUNTER — Ambulatory Visit: Payer: Medicaid Other | Admitting: Family Medicine

## 2019-04-23 ENCOUNTER — Other Ambulatory Visit: Payer: Self-pay

## 2019-04-23 ENCOUNTER — Telehealth: Payer: Self-pay | Admitting: Licensed Clinical Social Worker

## 2019-04-23 ENCOUNTER — Encounter: Payer: Self-pay | Admitting: Family Medicine

## 2019-04-23 ENCOUNTER — Ambulatory Visit: Payer: Medicaid Other | Attending: Family Medicine | Admitting: Family Medicine

## 2019-04-23 DIAGNOSIS — F339 Major depressive disorder, recurrent, unspecified: Secondary | ICD-10-CM

## 2019-04-23 DIAGNOSIS — F411 Generalized anxiety disorder: Secondary | ICD-10-CM | POA: Diagnosis not present

## 2019-04-23 DIAGNOSIS — Z789 Other specified health status: Secondary | ICD-10-CM

## 2019-04-23 MED ORDER — MIRTAZAPINE 15 MG PO TABS: 15.0000 mg | ORAL_TABLET | Freq: Every day | ORAL | 3 refills | Status: DC

## 2019-04-23 MED ORDER — ALPRAZOLAM 0.5 MG PO TABS: 0.5000 mg | ORAL_TABLET | Freq: Two times a day (BID) | ORAL | 0 refills | Status: DC | PRN
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
## 2019-04-23 NOTE — Progress Notes (Signed)
Patient has been called and DOB has been verified. Patient has been screened and transferred to PCP to start phone visit.    Patient needs refills on medications. 

## 2019-04-23 NOTE — Telephone Encounter (Signed)
Call placed to patient. LCSW introduced self and explained role at [facility]. Pt was informed of [facility] consult regarding behavioral health and/or resource needs.   Pt reports diagnosis of Depression, Anxiety, and Bipolar Disorder from "a long time ago". Pt reports increase in difficulty sleeping, decrease in appetite, episodes of crying, and increase in irritability. She receives no support from family or friends. Symptoms have impacted pt's ability to function in school.   LCSW provided support and encouragement. Pt is in agreement with referral to [facility] for medication management and therapy. Referral has been completed.   LCSW provided contact information and encouraged patient to reach out with any additional questions and concerns. Pt verbalized understanding.

## 2019-04-28 MED FILL — LANTUS 100 UNITS/ML VIAL: 100 | 28 days supply | Qty: 10 | Fill #0

## 2019-05-06 ENCOUNTER — Ambulatory Visit: Payer: Medicaid Other

## 2019-05-09 ENCOUNTER — Other Ambulatory Visit: Payer: Self-pay

## 2019-05-09 ENCOUNTER — Encounter (HOSPITAL_COMMUNITY): Payer: Self-pay | Admitting: Emergency Medicine

## 2019-05-09 ENCOUNTER — Emergency Department (HOSPITAL_COMMUNITY)
Admission: EM | Admit: 2019-05-09 | Discharge: 2019-05-09 | Disposition: A | Discharge status: Home / Self Care | Payer: Medicaid Other | Attending: Emergency Medicine | Admitting: Emergency Medicine

## 2019-05-09 DIAGNOSIS — Z21 Asymptomatic human immunodeficiency virus [HIV] infection status: Secondary | ICD-10-CM | POA: Insufficient documentation

## 2019-05-09 DIAGNOSIS — I10 Essential (primary) hypertension: Secondary | ICD-10-CM | POA: Insufficient documentation

## 2019-05-09 DIAGNOSIS — Z91040 Latex allergy status: Secondary | ICD-10-CM | POA: Diagnosis not present

## 2019-05-09 DIAGNOSIS — Z79899 Other long term (current) drug therapy: Secondary | ICD-10-CM | POA: Insufficient documentation

## 2019-05-09 DIAGNOSIS — F17210 Nicotine dependence, cigarettes, uncomplicated: Secondary | ICD-10-CM | POA: Insufficient documentation

## 2019-05-09 DIAGNOSIS — E119 Type 2 diabetes mellitus without complications: Secondary | ICD-10-CM | POA: Diagnosis not present

## 2019-05-09 DIAGNOSIS — R519 Headache, unspecified: Secondary | ICD-10-CM | POA: Diagnosis present

## 2019-05-09 DIAGNOSIS — J45909 Unspecified asthma, uncomplicated: Secondary | ICD-10-CM | POA: Insufficient documentation

## 2019-05-09 DIAGNOSIS — G43809 Other migraine, not intractable, without status migrainosus: Secondary | ICD-10-CM | POA: Diagnosis not present

## 2019-05-09 DIAGNOSIS — Z8541 Personal history of malignant neoplasm of cervix uteri: Secondary | ICD-10-CM | POA: Insufficient documentation

## 2019-05-09 MED ORDER — VALPROATE SODIUM 500 MG/5ML IV SOLN
500.0000 mg | Freq: Once | INTRAVENOUS | Status: AC
  Administered 2019-05-09: 500 mg via INTRAVENOUS
  Filled 2019-05-09: qty 5

## 2019-05-09 MED ORDER — KETOROLAC TROMETHAMINE 15 MG/ML IJ SOLN
15.0000 mg | Freq: Once | INTRAMUSCULAR | Status: AC
  Administered 2019-05-09: 15 mg via INTRAVENOUS
  Filled 2019-05-09: qty 1

## 2019-05-09 NOTE — ED Notes (Signed)
Two unsuccessful IV attempts.

## 2019-05-09 NOTE — Discharge Instructions (Signed)
Continue taking home medications as prescribed. Make sure you are staying well-hydrated water. Follow-up with your neurologist for further evaluation of your headaches and to schedule the MRI. Return to the emergency room with any new, worsening, concerning symptoms.
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
## 2019-05-09 NOTE — ED Notes (Signed)
Patient verbalizes understanding of discharge instructions. Opportunity for questioning and answers were provided. Armband removed by staff, pt discharged from ED ambulatory by self\  

## 2019-05-09 NOTE — ED Triage Notes (Signed)
C/o migraine and nausea x 4 days.  Taking her medications without relief.

## 2019-05-10 ENCOUNTER — Other Ambulatory Visit: Payer: Self-pay | Admitting: Family Medicine

## 2019-05-10 DIAGNOSIS — J4520 Mild intermittent asthma, uncomplicated: Secondary | ICD-10-CM

## 2019-05-11 ENCOUNTER — Other Ambulatory Visit: Payer: Self-pay

## 2019-05-11 ENCOUNTER — Other Ambulatory Visit (HOSPITAL_COMMUNITY)
Admission: RE | Admit: 2019-05-11 | Discharge: 2019-05-11 | Disposition: A | Discharge status: Home / Self Care | Payer: Medicaid Other | Source: Ambulatory Visit | Attending: Obstetrics and Gynecology | Admitting: Obstetrics and Gynecology

## 2019-05-11 ENCOUNTER — Ambulatory Visit: Payer: Medicaid Other | Admitting: Obstetrics and Gynecology

## 2019-05-11 ENCOUNTER — Encounter: Payer: Self-pay | Admitting: Obstetrics and Gynecology

## 2019-05-11 VITALS — BP 130/86 | HR 77 | Ht 62.0 in | Wt 225.0 lb

## 2019-05-11 DIAGNOSIS — N921 Excessive and frequent menstruation with irregular cycle: Secondary | ICD-10-CM

## 2019-05-11 DIAGNOSIS — D259 Leiomyoma of uterus, unspecified: Secondary | ICD-10-CM | POA: Insufficient documentation

## 2019-05-11 DIAGNOSIS — Z01812 Encounter for preprocedural laboratory examination: Secondary | ICD-10-CM

## 2019-05-11 LAB — POCT URINE PREGNANCY: Preg Test, Ur: NEGATIVE

## 2019-05-11 MED ORDER — MEGESTROL ACETATE 20 MG PO TABS: 20.0000 mg | ORAL_TABLET | Freq: Every day | ORAL | 1 refills | Status: DC

## 2019-05-11 NOTE — Progress Notes (Signed)
Patient ID: Lori Ford, female   DOB: 1975-08-24, 44 y.o.   MRN: [id number] [name] is here for follow up and discuss U/S results. U/S results reviewed with pt. Indication for EMBX dicussed Pt agrees  ENDOMETRIAL BIOPSY     The indications for endometrial biopsy were reviewed.   Risks of the biopsy including cramping, bleeding, infection, uterine perforation, inadequate specimen and need for additional procedures  were discussed. The patient states she understands and agrees to undergo procedure today. Consent was signed. Time out was performed. Urine HCG was negative. During the pelvic exam, the cervix was prepped with Betadine. A single-toothed tenaculum was placed on the anterior lip of the cervix to stabilize it. The 3 mm pipelle was introduced into the endometrial cavity without difficulty to a depth of 8cm, and a moderate amount of tissue was obtained and sent to pathology. The instruments were removed from the patient's vagina. Minimal bleeding from the cervix was noted. The patient tolerated the procedure well. Routine post-procedure instructions were given to the patient.    A/P Mehorraghia with irregular cycles        Uterine fibroids  EMBX completed tofay. Tx options reviewed with pt. Pt desires definite treatment. TVH with BS reviewed. R/B/Post op care discussed. Will obtain pre op clearance. Start Megace in the mean time. Schedule surgery once EMBX returns and pre op clearance obtained

## 2019-05-11 NOTE — Progress Notes (Signed)
GYN presents for Follow up US Results.  C/o pain 10/10

## 2019-05-11 NOTE — Addendum Note (Signed)
Addended by: Lewie Loron D on: 05/11/2019 02:41 PM   Modules accepted: Orders

## 2019-05-11 NOTE — Patient Instructions (Signed)
Vaginal Hysterectomy, Care After Refer to this sheet in the next few weeks. These instructions provide you with information about caring for yourself after your procedure. Your health care provider may also give you more specific instructions. Your treatment has been planned according to current medical practices, but problems sometimes occur. Call your health care provider if you have any problems or questions after your procedure. What can I expect after the procedure? After the procedure, it is common to have:  Pain.  Soreness and numbness in your incision areas.  Vaginal bleeding and discharge.  Constipation.  Temporary problems emptying the bladder.  Feelings of sadness or other emotions. Follow these instructions at home: Medicines  Take over-the-counter and prescription medicines only as told by your health care provider.  If you were prescribed an antibiotic medicine, take it as told by your health care provider. Do not stop taking the antibiotic even if you start to feel better.  Do not drive or operate heavy machinery while taking prescription pain medicine. Activity  Return to your normal activities as told by your health care provider. Ask your health care provider what activities are safe for you.  Get regular exercise as told by your health care provider. You may be told to take short walks every day and go farther each time.  Do not lift anything that is heavier than 10 lb (4.5 kg). General instructions   Do not put anything in your vagina for 6 weeks after your surgery or as told by your health care provider. This includes tampons and douches.  Do not have sex until your health care provider says you can.  Do not take baths, swim, or use a hot tub until your health care provider approves.  Drink enough fluid to keep your urine clear or pale yellow.  Do not drive for 24 hours if you were given a sedative.  Keep all follow-up visits as told by your health  care provider. This is important. Contact a health care provider if:  Your pain medicine is not helping.  You have a fever.  You have redness, swelling, or pain at your incision site.  You have blood, pus, or a bad-smelling discharge from your vagina.  You continue to have difficulty urinating. Get help right away if:  You have severe abdominal or back pain.  You have heavy bleeding from your vagina.  You have chest pain or shortness of breath. This information is not intended to replace advice given to you by your health care provider. Make sure you discuss any questions you have with your health care provider. Document Revised: 11/23/2015 Document Reviewed: 04/16/2015 Elsevier Patient Education  [place]. Endometrial Biopsy, Care After This sheet gives you information about how to care for yourself after your procedure. Your health care provider may also give you more specific instructions. If you have problems or questions, contact your health care provider. What can I expect after the procedure? After the procedure, it is common to have:  Mild cramping.  A small amount of vaginal bleeding for a few days. This is normal. Follow these instructions at home:   Take over-the-counter and prescription medicines only as told by your health care provider.  Do not douche, use tampons, or have sexual intercourse until your health care provider approves.  Return to your normal activities as told by your health care provider. Ask your health care provider what activities are safe for you.  Follow instructions from your health care provider about  any activity restrictions, such as restrictions on strenuous exercise or heavy lifting. Contact a health care provider if:  You have heavy bleeding, or bleed for longer than 2 days after the procedure.  You have bad smelling discharge from your vagina.  You have a fever or chills.  You have a burning sensation when urinating  or you have difficulty urinating.  You have severe pain in your lower abdomen. Get help right away if:  You have severe cramps in your stomach or back.  You pass large blood clots.  Your bleeding increases.  You become weak or light-headed, or you pass out. Summary  After the procedure, it is common to have mild cramping and a small amount of vaginal bleeding for a few days.  Do not douche, use tampons, or have sexual intercourse until your health care provider approves.  Return to your normal activities as told by your health care provider. Ask your health care provider what activities are safe for you. This information is not intended to replace advice given to you by your health care provider. Make sure you discuss any questions you have with your health care provider. Document Revised: 03/14/2017 Document Reviewed: 04/17/2016 Elsevier Patient Education  2020 [place]. Vaginal Hysterectomy  A vaginal hysterectomy is a procedure to remove all or part of the uterus through a small incision in the vagina. In this procedure, your health care provider may remove your entire uterus, including the lower end (cervix). You may need a vaginal hysterectomy to treat:  Uterine fibroids.  A condition that causes the lining of the uterus to grow in other areas (endometriosis).  Problems with pelvic support.  Cancer of the cervix, ovaries, uterus, or tissue that lines the uterus (endometrium).  Excessive (dysfunctional) uterine bleeding. When removing your uterus, your health care provider may also remove the organs that produce eggs (ovaries) and the tubes that carry eggs to your uterus (fallopian tubes). After a vaginal hysterectomy, you will no longer be able to have a baby. You will also no longer get your menstrual period. Tell a health care provider about:  Any allergies you have.  All medicines you are taking, including vitamins, herbs, eye drops, creams, and over-the-counter  medicines.  Any problems you or family members have had with anesthetic medicines.  Any blood disorders you have.  Any surgeries you have had.  Any medical conditions you have.  Whether you are pregnant or may be pregnant. What are the risks? Generally, this is a safe procedure. However, problems may occur, including:  Bleeding.  Infection.  A blood clot that forms in your leg and travels to your lungs (pulmonary embolism).  Damage to surrounding organs.  Pain during sex. What happens before the procedure?  Ask your health care provider what organs will be removed during surgery.  Ask your health care provider about: ? Changing or stopping your regular medicines. This is especially important if you are taking diabetes medicines or blood thinners. ? Taking medicines such as aspirin and ibuprofen. These medicines can thin your blood. Do not take these medicines before your procedure if your health care provider instructs you not to.  Follow instructions from your health care provider about eating or drinking restrictions.  Do not use any tobacco products, such as cigarettes, chewing tobacco, and e-cigarettes. If you need help quitting, ask your health care provider.  Plan to have someone take you home after discharge from the hospital. What happens during the procedure?  To reduce your  risk of infection: ? Your health care team will wash or sanitize their hands. ? Your skin will be washed with soap.  An IV tube will be inserted into one of your veins.  You may be given antibiotic medicine to help prevent infection.  You will be given one or more of the following: ? A medicine to help you relax (sedative). ? A medicine to numb the area (local anesthetic). ? A medicine to make you fall asleep (general anesthetic). ? A medicine that is injected into an area of your body to numb everything beyond the injection site (regional anesthetic).  Your surgeon will make an  incision in your vagina.  Your surgeon will locate and remove all or part of your uterus.  Your ovaries and fallopian tubes may be removed at the same time.  The incision will be closed with stitches (sutures) that dissolve over time. The procedure may vary among health care providers and hospitals. What happens after the procedure?  Your blood pressure, heart rate, breathing rate, and blood oxygen level will be monitored often until the medicines you were given have worn off.  You will be encouraged to get up and walk around after a few hours to help prevent complications.  You may have IV tubes in place for a few days.  You will be given pain medicine as needed.  Do not drive for 24 hours if you were given a sedative. This information is not intended to replace advice given to you by your health care provider. Make sure you discuss any questions you have with your health care provider. Document Revised: 11/23/2015 Document Reviewed: 04/16/2015 Elsevier Patient Education  2020 [employer].

## 2019-05-13 ENCOUNTER — Ambulatory Visit: Payer: Medicaid Other | Admitting: Family

## 2019-05-13 ENCOUNTER — Telehealth: Payer: Self-pay | Admitting: Family

## 2019-05-13 ENCOUNTER — Ambulatory Visit: Payer: Medicaid Other

## 2019-05-13 LAB — SURGICAL PATHOLOGY

## 2019-05-24 MED FILL — SYMBICORT 80-4.5 MCG INH: 80-4.5 | 30 days supply | Qty: 10 | Fill #0

## 2019-05-24 MED FILL — LANTUS 100 UNITS/ML VIAL: 100 | 28 days supply | Qty: 10 | Fill #0 | Status: TO

## 2019-05-26 LAB — HM DIABETES EYE EXAM

## 2019-06-07 ENCOUNTER — Telehealth: Payer: Self-pay

## 2019-06-07 NOTE — Telephone Encounter (Signed)
[place] THRU 05/29/20.

## 2019-06-14 ENCOUNTER — Ambulatory Visit: Payer: Medicaid Other | Admitting: Family Medicine

## 2019-06-16 ENCOUNTER — Telehealth: Payer: Self-pay | Admitting: Family Medicine

## 2019-06-16 DIAGNOSIS — E1165 Type 2 diabetes mellitus with hyperglycemia: Secondary | ICD-10-CM

## 2019-06-16 DIAGNOSIS — IMO0002 Reserved for concepts with insufficient information to code with codable children: Secondary | ICD-10-CM

## 2019-06-16 DIAGNOSIS — J4520 Mild intermittent asthma, uncomplicated: Secondary | ICD-10-CM

## 2019-06-16 MED ORDER — LANTUS SOLOSTAR 100 UNIT/ML ~~LOC~~ SOPN: 10.0000 [IU] | PEN_INJECTOR | Freq: Every day | SUBCUTANEOUS | 5 refills | Status: DC

## 2019-06-16 MED ORDER — ALBUTEROL SULFATE HFA 108 (90 BASE) MCG/ACT IN AERS: INHALATION_SPRAY | RESPIRATORY_TRACT | 2 refills | Status: DC

## 2019-06-16 MED ORDER — VICTOZA 18 MG/3ML ~~LOC~~ SOPN: PEN_INJECTOR | SUBCUTANEOUS | 2 refills | Status: DC

## 2019-06-16 NOTE — Telephone Encounter (Signed)
Fluticasone-Salmeterol (ADVAIR DISKUS) 100-50 MCG/DOSE AEPB [id number]   Pt needs immediate refill to  [facility] pharmacy

## 2019-06-16 NOTE — Telephone Encounter (Signed)
Rx for Lantus, Victoza, and albuterol sent. Her Symbicort is at our pharmacy and cannot be filled yet as it is too early per insurance.

## 2019-06-16 NOTE — Telephone Encounter (Signed)
Pt is requesting all inhalers as well as: Insulin Glargine (LANTUS SOLOSTAR) 100 UNIT/ML Solostar Pen [id number]   And   liraglutide (VICTOZA) 18 MG/3ML SOPN [id number]   Sent to our pharmacy [facility] please and thank you

## 2019-06-17 MED FILL — VICTOZA 2-PAK 18 MG/3 ML PE: 18 | 30 days supply | Qty: 6 | Fill #0

## 2019-06-17 MED FILL — ALBUTEROL SULFATE HFA 108 (: 108 (90 BAS | 16 days supply | Qty: 18 | Fill #0

## 2019-06-18 ENCOUNTER — Telehealth: Payer: Self-pay

## 2019-06-18 MED ORDER — INSULIN GLARGINE 100 UNIT/ML ~~LOC~~ SOLN: 10.0000 [IU] | Freq: Every day | SUBCUTANEOUS | 2 refills | Status: DC

## 2019-06-18 MED FILL — LANTUS 100 UNITS/ML VIAL: 100 | 28 days supply | Qty: 10 | Fill #0

## 2019-06-18 NOTE — Addendum Note (Signed)
Addended by: Daisy Blossom, Annie Main L on: 06/18/2019 02:43 PM   Modules accepted: Orders

## 2019-06-18 NOTE — Telephone Encounter (Signed)
Please let me know whether or not Lori Ford already change the order to vials for the Lantus and if not I can resubmit the order

## 2019-06-18 NOTE — Telephone Encounter (Signed)
Pt requests that her Lantus Solostar pens be resent to [place] as the Lantus vial.  She prefers the vials.

## 2019-06-19 NOTE — Telephone Encounter (Signed)
The medication was switched and pt did pick up the vials.

## 2019-06-23 ENCOUNTER — Ambulatory Visit: Payer: Medicaid Other | Attending: Family Medicine | Admitting: Family Medicine

## 2019-06-23 ENCOUNTER — Encounter: Payer: Self-pay | Admitting: Family Medicine

## 2019-06-23 ENCOUNTER — Other Ambulatory Visit (HOSPITAL_COMMUNITY): Payer: Self-pay | Admitting: Family Medicine

## 2019-06-23 ENCOUNTER — Other Ambulatory Visit: Payer: Self-pay

## 2019-06-23 VITALS — BP 143/71 | HR 78 | Temp 98.1°F | Ht 62.0 in | Wt 216.4 lb

## 2019-06-23 DIAGNOSIS — E119 Type 2 diabetes mellitus without complications: Secondary | ICD-10-CM

## 2019-06-23 DIAGNOSIS — F339 Major depressive disorder, recurrent, unspecified: Secondary | ICD-10-CM | POA: Diagnosis not present

## 2019-06-23 DIAGNOSIS — I10 Essential (primary) hypertension: Secondary | ICD-10-CM | POA: Diagnosis not present

## 2019-06-23 DIAGNOSIS — Z7901 Long term (current) use of anticoagulants: Secondary | ICD-10-CM | POA: Diagnosis not present

## 2019-06-23 DIAGNOSIS — N938 Other specified abnormal uterine and vaginal bleeding: Secondary | ICD-10-CM | POA: Diagnosis not present

## 2019-06-23 DIAGNOSIS — D259 Leiomyoma of uterus, unspecified: Secondary | ICD-10-CM

## 2019-06-23 DIAGNOSIS — F17210 Nicotine dependence, cigarettes, uncomplicated: Secondary | ICD-10-CM | POA: Insufficient documentation

## 2019-06-23 DIAGNOSIS — Z794 Long term (current) use of insulin: Secondary | ICD-10-CM | POA: Diagnosis not present

## 2019-06-23 DIAGNOSIS — Z01818 Encounter for other preprocedural examination: Secondary | ICD-10-CM | POA: Diagnosis not present

## 2019-06-23 DIAGNOSIS — Z9119 Patient's noncompliance with other medical treatment and regimen: Secondary | ICD-10-CM | POA: Diagnosis not present

## 2019-06-23 DIAGNOSIS — Z8659 Personal history of other mental and behavioral disorders: Secondary | ICD-10-CM | POA: Diagnosis not present

## 2019-06-23 DIAGNOSIS — G40909 Epilepsy, unspecified, not intractable, without status epilepticus: Secondary | ICD-10-CM | POA: Diagnosis not present

## 2019-06-23 DIAGNOSIS — Z21 Asymptomatic human immunodeficiency virus [HIV] infection status: Secondary | ICD-10-CM

## 2019-06-23 DIAGNOSIS — B20 Human immunodeficiency virus [HIV] disease: Secondary | ICD-10-CM

## 2019-06-23 DIAGNOSIS — G43709 Chronic migraine without aura, not intractable, without status migrainosus: Secondary | ICD-10-CM

## 2019-06-23 DIAGNOSIS — F411 Generalized anxiety disorder: Secondary | ICD-10-CM | POA: Diagnosis not present

## 2019-06-23 DIAGNOSIS — J4520 Mild intermittent asthma, uncomplicated: Secondary | ICD-10-CM

## 2019-06-23 DIAGNOSIS — Z86011 Personal history of benign neoplasm of the brain: Secondary | ICD-10-CM

## 2019-06-23 DIAGNOSIS — Z79899 Other long term (current) drug therapy: Secondary | ICD-10-CM | POA: Insufficient documentation

## 2019-06-23 DIAGNOSIS — Z91199 Patient's noncompliance with other medical treatment and regimen due to unspecified reason: Secondary | ICD-10-CM

## 2019-06-23 DIAGNOSIS — R413 Other amnesia: Secondary | ICD-10-CM

## 2019-06-23 DIAGNOSIS — Z789 Other specified health status: Secondary | ICD-10-CM

## 2019-06-23 DIAGNOSIS — Z87828 Personal history of other (healed) physical injury and trauma: Secondary | ICD-10-CM

## 2019-06-23 MED ORDER — SITAGLIPTIN PHOSPHATE 50 MG PO TABS: 50.0000 mg | ORAL_TABLET | Freq: Every day | ORAL | 5 refills | Status: DC

## 2019-06-23 MED ORDER — ALBUTEROL SULFATE HFA 108 (90 BASE) MCG/ACT IN AERS: INHALATION_SPRAY | RESPIRATORY_TRACT | 2 refills | Status: DC

## 2019-06-23 MED ORDER — TRIAMCINOLONE ACETONIDE 0.5 % EX OINT: 1.0000 "application " | TOPICAL_OINTMENT | Freq: Two times a day (BID) | CUTANEOUS | 0 refills | Status: DC

## 2019-06-23 MED ORDER — FLUTICASONE-SALMETEROL 100-50 MCG/DOSE IN AEPB: 1.0000 | INHALATION_SPRAY | Freq: Every day | RESPIRATORY_TRACT | 3 refills | Status: DC
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
## 2019-06-24 LAB — CBC WITH DIFFERENTIAL/PLATELET
Basophils Absolute: 0 x10E3/uL (ref 0.0–0.2)
Basos: 1 %
EOS (ABSOLUTE): 0 x10E3/uL (ref 0.0–0.4)
Eos: 0 %
Hematocrit: 40.2 % (ref 34.0–46.6)
Hemoglobin: 13.6 g/dL (ref 11.1–15.9)
Immature Grans (Abs): 0 x10E3/uL (ref 0.0–0.1)
Immature Granulocytes: 1 %
Lymphocytes Absolute: 3.2 x10E3/uL — ABNORMAL HIGH (ref 0.7–3.1)
Lymphs: 50 %
MCH: 31.3 pg (ref 26.6–33.0)
MCHC: 33.8 g/dL (ref 31.5–35.7)
MCV: 92 fL (ref 79–97)
Monocytes Absolute: 0.5 x10E3/uL (ref 0.1–0.9)
Monocytes: 7 %
Neutrophils Absolute: 2.6 x10E3/uL (ref 1.4–7.0)
Neutrophils: 41 %
Platelets: 211 x10E3/uL (ref 150–450)
RBC: 4.35 x10E6/uL (ref 3.77–5.28)
RDW: 13.2 % (ref 11.7–15.4)
WBC: 6.3 x10E3/uL (ref 3.4–10.8)

## 2019-06-24 LAB — COMPREHENSIVE METABOLIC PANEL WITH GFR
ALT: 12 IU/L (ref 0–32)
AST: 17 IU/L (ref 0–40)
Albumin/Globulin Ratio: 1.7 (ref 1.2–2.2)
Albumin: 4.3 g/dL (ref 3.8–4.8)
Alkaline Phosphatase: 64 IU/L (ref 39–117)
BUN/Creatinine Ratio: 11 (ref 9–23)
BUN: 9 mg/dL (ref 6–24)
Bilirubin Total: 0.2 mg/dL (ref 0.0–1.2)
CO2: 23 mmol/L (ref 20–29)
Calcium: 9.3 mg/dL (ref 8.7–10.2)
Chloride: 104 mmol/L (ref 96–106)
Creatinine, Ser: 0.83 mg/dL (ref 0.57–1.00)
GFR calc Af Amer: 101 mL/min/1.73
GFR calc non Af Amer: 88 mL/min/1.73
Globulin, Total: 2.5 g/dL (ref 1.5–4.5)
Glucose: 123 mg/dL — ABNORMAL HIGH (ref 65–99)
Potassium: 4 mmol/L (ref 3.5–5.2)
Sodium: 138 mmol/L (ref 134–144)
Total Protein: 6.8 g/dL (ref 6.0–8.5)

## 2019-06-24 LAB — T4 AND TSH
T4, Total: 6.8 ug/dL (ref 4.5–12.0)
TSH: 1.4 u[IU]/mL (ref 0.450–4.500)

## 2019-06-24 LAB — HEMOGLOBIN A1C
Est. average glucose Bld gHb Est-mCnc: 114 mg/dL
Hgb A1c MFr Bld: 5.6 % (ref 4.8–5.6)

## 2019-06-24 MED FILL — ADVAIR 100/50 DISKUS: 100-50 | 30 days supply | Qty: 60 | Fill #0

## 2019-06-24 MED FILL — TRIAMCINOLONE 0.5% OINTMENT: 0.5 | 7 days supply | Qty: 30 | Fill #0

## 2019-06-25 ENCOUNTER — Telehealth: Payer: Self-pay

## 2019-06-25 NOTE — Telephone Encounter (Signed)
[place] prior auth approved thru 06/24/20

## 2019-07-02 ENCOUNTER — Other Ambulatory Visit: Payer: Self-pay | Admitting: Physician Assistant

## 2019-07-02 DIAGNOSIS — I10 Essential (primary) hypertension: Secondary | ICD-10-CM

## 2019-07-02 DIAGNOSIS — IMO0002 Reserved for concepts with insufficient information to code with codable children: Secondary | ICD-10-CM

## 2019-07-02 DIAGNOSIS — E1165 Type 2 diabetes mellitus with hyperglycemia: Secondary | ICD-10-CM

## 2019-07-12 ENCOUNTER — Other Ambulatory Visit: Payer: Self-pay | Admitting: Physician Assistant

## 2019-07-12 DIAGNOSIS — F419 Anxiety disorder, unspecified: Secondary | ICD-10-CM

## 2019-07-12 DIAGNOSIS — R1013 Epigastric pain: Secondary | ICD-10-CM

## 2019-07-20 ENCOUNTER — Other Ambulatory Visit: Payer: Self-pay | Admitting: Physician Assistant

## 2019-07-20 ENCOUNTER — Other Ambulatory Visit: Payer: Self-pay | Admitting: Family Medicine

## 2019-07-20 DIAGNOSIS — J4520 Mild intermittent asthma, uncomplicated: Secondary | ICD-10-CM

## 2019-07-20 DIAGNOSIS — E1165 Type 2 diabetes mellitus with hyperglycemia: Secondary | ICD-10-CM

## 2019-07-20 DIAGNOSIS — IMO0002 Reserved for concepts with insufficient information to code with codable children: Secondary | ICD-10-CM

## 2019-07-20 MED FILL — SYMBICORT 80-4.5 MCG INH: 80-4.5 | 30 days supply | Qty: 10 | Fill #1

## 2019-07-20 MED FILL — ADVAIR 100/50 DISKUS: 100-50 | 30 days supply | Qty: 60 | Fill #1

## 2019-07-20 MED FILL — ALBUTEROL SULFATE HFA 108 (: 108 (90 BAS | 16 days supply | Qty: 18 | Fill #1

## 2019-07-20 MED FILL — LANTUS 100 UNITS/ML VIAL: 100 | 28 days supply | Qty: 10 | Fill #1

## 2019-07-20 NOTE — Progress Notes (Deleted)
   Subjective:    Patient ID: Lori Ford, female    DOB: 07-Mar-1976, 44 y.o.   MRN: [id number]  Pt of Dr Chapman Fitch hx of DVT/ asthma/GERD/ Sz D/O, HIV , obese MDD     Review of Systems     Objective:   Physical Exam        Assessment & Plan:

## 2019-07-21 ENCOUNTER — Ambulatory Visit: Payer: Medicaid Other | Admitting: Family Medicine

## 2019-07-21 ENCOUNTER — Ambulatory Visit: Payer: Medicaid Other | Admitting: Critical Care Medicine

## 2019-07-21 ENCOUNTER — Other Ambulatory Visit: Payer: Self-pay | Admitting: Family Medicine

## 2019-07-21 DIAGNOSIS — IMO0002 Reserved for concepts with insufficient information to code with codable children: Secondary | ICD-10-CM

## 2019-07-21 DIAGNOSIS — E1165 Type 2 diabetes mellitus with hyperglycemia: Secondary | ICD-10-CM

## 2019-07-26 ENCOUNTER — Emergency Department (HOSPITAL_COMMUNITY)
Admission: EM | Admit: 2019-07-26 | Discharge: 2019-07-26 | Disposition: A | Discharge status: Home / Self Care | Payer: Medicaid Other | Attending: Emergency Medicine | Admitting: Emergency Medicine

## 2019-07-26 ENCOUNTER — Other Ambulatory Visit: Payer: Self-pay

## 2019-07-26 ENCOUNTER — Encounter: Payer: Self-pay | Admitting: Family

## 2019-07-26 ENCOUNTER — Encounter (HOSPITAL_COMMUNITY): Payer: Self-pay | Admitting: Emergency Medicine

## 2019-07-26 DIAGNOSIS — Z794 Long term (current) use of insulin: Secondary | ICD-10-CM | POA: Diagnosis not present

## 2019-07-26 DIAGNOSIS — E119 Type 2 diabetes mellitus without complications: Secondary | ICD-10-CM | POA: Diagnosis not present

## 2019-07-26 DIAGNOSIS — Z8541 Personal history of malignant neoplasm of cervix uteri: Secondary | ICD-10-CM | POA: Diagnosis not present

## 2019-07-26 DIAGNOSIS — Z91040 Latex allergy status: Secondary | ICD-10-CM | POA: Diagnosis not present

## 2019-07-26 DIAGNOSIS — R569 Unspecified convulsions: Secondary | ICD-10-CM | POA: Diagnosis present

## 2019-07-26 DIAGNOSIS — I10 Essential (primary) hypertension: Secondary | ICD-10-CM | POA: Insufficient documentation

## 2019-07-26 DIAGNOSIS — Z79899 Other long term (current) drug therapy: Secondary | ICD-10-CM | POA: Diagnosis not present

## 2019-07-26 DIAGNOSIS — R519 Headache, unspecified: Secondary | ICD-10-CM | POA: Insufficient documentation

## 2019-07-26 DIAGNOSIS — D332 Benign neoplasm of brain, unspecified: Secondary | ICD-10-CM | POA: Insufficient documentation

## 2019-07-26 DIAGNOSIS — F17210 Nicotine dependence, cigarettes, uncomplicated: Secondary | ICD-10-CM | POA: Insufficient documentation

## 2019-07-26 DIAGNOSIS — Z21 Asymptomatic human immunodeficiency virus [HIV] infection status: Secondary | ICD-10-CM | POA: Diagnosis not present

## 2019-07-26 DIAGNOSIS — J45909 Unspecified asthma, uncomplicated: Secondary | ICD-10-CM | POA: Diagnosis not present

## 2019-07-26 LAB — BASIC METABOLIC PANEL
Anion gap: 10 (ref 5–15)
BUN: 8 mg/dL (ref 6–20)
CO2: 23 mmol/L (ref 22–32)
Calcium: 8.6 mg/dL — ABNORMAL LOW (ref 8.9–10.3)
Chloride: 104 mmol/L (ref 98–111)
Creatinine, Ser: 0.83 mg/dL (ref 0.44–1.00)
GFR calc Af Amer: >60 mL/min (ref >60)
GFR calc non Af Amer: >60 mL/min (ref >60)
Glucose, Bld: 102 mg/dL — ABNORMAL HIGH (ref 70–99)
Potassium: 3.9 mmol/L (ref 3.5–5.1)
Sodium: 137 mmol/L (ref 135–145)

## 2019-07-26 LAB — CBC
HCT: 40.3 % (ref 36.0–46.0)
Hemoglobin: 13.1 g/dL (ref 12.0–15.0)
MCH: 30.9 pg (ref 26.0–34.0)
MCHC: 32.5 g/dL (ref 30.0–36.0)
MCV: 95 fL (ref 80.0–100.0)
Platelets: 205 10*3/uL (ref 150–400)
RBC: 4.24 MIL/uL (ref 3.87–5.11)
RDW: 13.7 % (ref 11.5–15.5)
WBC: 4.3 10*3/uL (ref 4.0–10.5)
nRBC: 0 % (ref 0.0–0.2)

## 2019-07-26 LAB — PHENYTOIN LEVEL, TOTAL: Phenytoin Lvl: <2.5 ug/mL — ABNORMAL LOW (ref 10.0–20.0)

## 2019-07-26 LAB — I-STAT BETA HCG BLOOD, ED (MC, WL, AP ONLY): I-stat hCG, quantitative: <5 m[IU]/mL (ref <5)

## 2019-07-26 MED ORDER — PROCHLORPERAZINE EDISYLATE 10 MG/2ML IJ SOLN
10.0000 mg | Freq: Once | INTRAMUSCULAR | Status: AC
  Administered 2019-07-26: 09:00:00 10 mg via INTRAVENOUS
  Filled 2019-07-26: qty 2

## 2019-07-26 MED ORDER — DIPHENHYDRAMINE HCL 50 MG/ML IJ SOLN
25.0000 mg | Freq: Once | INTRAMUSCULAR | Status: AC
  Administered 2019-07-26: 25 mg via INTRAVENOUS
  Filled 2019-07-26: qty 1

## 2019-07-26 NOTE — ED Notes (Signed)
Pt seems lethargic, denies having a seizures since here at the ED. C/O headache

## 2019-07-26 NOTE — ED Triage Notes (Signed)
Patient reports brief grand mal seizure last night approx. < 1 min., denies injury , alert and oriented at arrival /respirations unlabored .

## 2019-07-26 NOTE — Progress Notes (Signed)
Patient ID: Lori Ford, female   DOB: [birth year], 44 y.o.   MRN: [id number] Working Viral Load List  Called patient left message to call for appointment
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
## 2019-07-26 NOTE — Discharge Instructions (Signed)
Please return to the emergency room at anytime you want [place] to further work-up your seizures.  Please follow-up with your doctor today as scheduled.

## 2019-08-17 ENCOUNTER — Other Ambulatory Visit: Payer: Self-pay

## 2019-08-17 ENCOUNTER — Other Ambulatory Visit (HOSPITAL_COMMUNITY): Payer: Self-pay | Admitting: Endocrinology

## 2019-08-17 ENCOUNTER — Encounter: Payer: Self-pay | Admitting: Endocrinology

## 2019-08-17 ENCOUNTER — Ambulatory Visit: Payer: Medicaid Other | Admitting: Endocrinology

## 2019-08-17 VITALS — BP 110/74 | HR 78 | Ht 64.0 in | Wt 213.6 lb

## 2019-08-17 DIAGNOSIS — IMO0002 Reserved for concepts with insufficient information to code with codable children: Secondary | ICD-10-CM

## 2019-08-17 DIAGNOSIS — E782 Mixed hyperlipidemia: Secondary | ICD-10-CM | POA: Diagnosis not present

## 2019-08-17 DIAGNOSIS — E1165 Type 2 diabetes mellitus with hyperglycemia: Secondary | ICD-10-CM

## 2019-08-17 DIAGNOSIS — E118 Type 2 diabetes mellitus with unspecified complications: Secondary | ICD-10-CM | POA: Diagnosis not present

## 2019-08-17 DIAGNOSIS — E669 Obesity, unspecified: Secondary | ICD-10-CM | POA: Diagnosis not present

## 2019-08-17 DIAGNOSIS — E1169 Type 2 diabetes mellitus with other specified complication: Secondary | ICD-10-CM | POA: Diagnosis not present

## 2019-08-17 DIAGNOSIS — E119 Type 2 diabetes mellitus without complications: Secondary | ICD-10-CM | POA: Diagnosis not present

## 2019-08-17 LAB — GLUCOSE, POCT (MANUAL RESULT ENTRY): POC Glucose: 119 mg/dl — AB (ref 70–99)

## 2019-08-17 MED ORDER — ACCU-CHEK AVIVA PLUS VI STRP: ORAL_STRIP | 5 refills | Status: DC

## 2019-08-17 MED ORDER — VICTOZA 18 MG/3ML ~~LOC~~ SOPN: PEN_INJECTOR | SUBCUTANEOUS | 2 refills | Status: DC
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
## 2019-08-17 NOTE — Patient Instructions (Signed)
Check blood sugars on waking up 3 days a week  Also check blood sugars about 2 hours after meals and do this after different meals by rotation  Recommended blood sugar levels on waking up are 80-130 and about 2 hours after meal is 130-160  Please bring your blood sugar monitor to each visit, thank you  Victoza 1.8mg  daily
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
## 2019-08-19 ENCOUNTER — Telehealth: Payer: Self-pay

## 2019-08-19 NOTE — Telephone Encounter (Signed)
The patient was previously contacted on 08/13/2019 and  advised that she needed to come into the office to sign her hysterectomy statement prior to her surgery.  The patient stated that she would come into the office on Monday 5/3/20201.  The patient did not come into the office on that day.  The patient was contacted again by phone on 08/18/2019 and 08/19/2019.  The patient did not answer the phone and a voicemail was left with both calls asking her to contact our office.

## 2019-08-19 NOTE — Pre-Procedure Instructions (Signed)
Lori Ford  08/19/2019      [facility] [place], [place] - [address] AT [place] [address] Phone: [phone] Fax: [phone]  [id number] - [place], [place] [id number] [place] [id number] Phone: [phone] Fax: [phone]  [facility] [place], [place] - [address] [place] [address] Phone: [phone] Fax: [phone]  [facility] [place], [place] [address] Phone: [phone] Fax: [phone]  [facility] [place], [place] [address] Phone: [phone] Fax: [place], [place] [address] [place] [address] Phone: [phone] Fax: [phone]    Your procedure is scheduled on May 11  Report to [facility] Entrance A at 5:30 A.M.  Call this number if you have problems the morning of surgery:  [phone]   Remember:  Do not eat after midnight.  You may drink clear liquids until 4:30 A.M..  Clear liquids allowed are:                    Water, Juice (non-citric and without pulp), Carbonated beverages, Clear Tea, Black Coffee only, Plain Jell-O only, Gatorade and Plain Popsicles only    Take these medicines the morning of surgery with A SIP OF WATER :             Alprazolam (xanax)            Amlodipine (norvasc)            biktarvy            Buspirone (buspar)           Divalproex (depakote)           advir-bring to hospital           Gabapentin (neurontin)           levetiracetam (keppra)           Loratadine (claritin)  Megestrol (megace)           Omeprazole (prilosec)           Oxycodone           Eye drops          Propranolol (inderal)          symbicort           Phenytoin (dilantin)           If needed:          Albuterol inhaler--bring to hospital          fioricet           Nasal spray      How to Manage Your Diabetes Before and After Surgery  Why is it important to control my blood sugar before and after surgery? . Improving blood sugar levels before and after surgery helps healing and can limit problems. . A way of improving blood sugar control is eating a healthy diet by: o  Eating less sugar and carbohydrates o  Increasing activity/exercise o  Talking with your doctor about reaching your blood sugar goals . High blood sugars (greater than 180 mg/dL) can raise your risk of infections and slow your recovery, so you will need to focus on controlling your diabetes during the weeks before surgery. . Make sure that the doctor who takes care of your diabetes knows about your planned surgery including the date and location.  How do I manage my blood sugar before surgery? . Check your blood sugar at least 4 times a day, starting 2 days before surgery, to make sure that the level is not too high or low. o Check your blood sugar the morning of your surgery when you wake up and every 2 hours until you get to the Short Stay unit. . If your blood sugar is less than 70 mg/dL, you will need to treat for low blood sugar: o Do not take insulin. o Treat a low blood sugar (less than 70 mg/dL) with  cup of clear juice (cranberry or apple), 4 glucose tablets, OR glucose gel. Recheck blood sugar in 15 minutes after treatment (to make sure it is greater than 70 mg/dL). If your blood sugar is not greater than 70 mg/dL on recheck, call [phone] o  for further instructions. . Report your blood sugar to the short stay nurse when you get to Short Stay.  . If you are admitted to the hospital after surgery:  o Your blood sugar will be checked by the staff and you will probably be given insulin after surgery (instead of oral diabetes medicines) to make sure you have good blood sugar levels. o The goal for blood sugar control after surgery is 80-180 mg/dL.      WHAT DO I DO ABOUT MY DIABETES MEDICATION?   Marland Kitchen Do not take oral diabetes medicines (pills) the morning of surgery.    . The day of surgery, do not take other diabetes injectables, including Byetta (exenatide), Bydureon (exenatide ER), Victoza (liraglutide), or Trulicity (dulaglutide).     Do not wear jewelry, make-up or nail polish.  Do not wear lotions, powders, or perfumes, or deodorant.  Do not shave 48 hours prior to surgery.  Men may shave face and neck.  Do not bring valuables to the hospital.  [facility] is not responsible for any belongings or valuables.  Contacts, dentures or bridgework may not be worn into surgery.  Leave your suitcase in the car.  After surgery it may be brought to your room.  For patients admitted to the hospital, discharge time will be determined by your treatment team.  Patients discharged the day of surgery will not be allowed to drive home.    Special instructions:  [facility]- Preparing For Surgery  Before surgery, you can play an important role. Because skin is not sterile, your skin needs to be as free of germs as possible. You can reduce the number of germs on your skin by washing with CHG (chlorahexidine gluconate) Soap before surgery.  CHG is an antiseptic cleaner which kills germs and bonds with the skin to continue killing germs even after washing.    Oral Hygiene is also important to reduce your risk of infection.  Remember - BRUSH YOUR TEETH THE MORNING OF SURGERY WITH YOUR REGULAR TOOTHPASTE  Please do not use if you have an allergy to CHG or antibacterial soaps. If your skin becomes reddened/irritated stop using the CHG.  Do not shave (including legs and underarms) for at least 48  hours prior to first CHG  shower. It is OK to shave your face.  Please follow these instructions carefully.   1. Shower the NIGHT BEFORE SURGERY and the MORNING OF SURGERY with CHG.   2. If you chose to wash your hair, wash your hair first as usual with your normal shampoo.  3. After you shampoo, rinse your hair and body thoroughly to remove the shampoo.  4. Use CHG as you would any other liquid soap. You can apply CHG directly to the skin and wash gently with a scrungie or a clean washcloth.   5. Apply the CHG Soap to your body ONLY FROM THE NECK DOWN.  Do not use on open wounds or open sores. Avoid contact with your eyes, ears, mouth and genitals (private parts). Wash Face and genitals (private parts)  with your normal soap.  6. Wash thoroughly, paying special attention to the area where your surgery will be performed.  7. Thoroughly rinse your body with warm water from the neck down.  8. DO NOT shower/wash with your normal soap after using and rinsing off the CHG Soap.  9. Pat yourself dry with a CLEAN TOWEL.  10. Wear CLEAN PAJAMAS to bed the night before surgery, wear comfortable clothes the morning of surgery  11. Place CLEAN SHEETS on your bed the night of your first shower and DO NOT SLEEP WITH PETS.    Day of Surgery:  Do not apply any deodorants/lotions.  Please wear clean clothes to the hospital/surgery center.   Remember to brush your teeth WITH YOUR REGULAR TOOTHPASTE.    Please read over the following fact sheets that you were given. Coughing and Deep Breathing and Surgical Site Infection Prevention

## 2019-08-20 ENCOUNTER — Inpatient Hospital Stay (HOSPITAL_COMMUNITY)
Admission: RE | Admit: 2019-08-20 | Discharge: 2019-08-20 | Disposition: A | Discharge status: Home / Self Care | Payer: Medicaid Other | Source: Ambulatory Visit

## 2019-08-20 ENCOUNTER — Inpatient Hospital Stay (HOSPITAL_COMMUNITY): Admission: RE | Admit: 2019-08-20 | Payer: Medicaid Other | Source: Ambulatory Visit

## 2019-08-20 MED FILL — ALBUTEROL SULFATE HFA 108 (: 108 (90 BAS | 16 days supply | Qty: 18 | Fill #2

## 2019-08-20 NOTE — Progress Notes (Signed)
Attempt made to call patient for same-day call work-up. No answer.

## 2019-08-20 NOTE — Progress Notes (Signed)
Patient did not show up for PAT appointment. Called patient, who stated she forgot about an appointment and is requesting an appointment on Monday May 10 instead. Notified scheduler, Baker Janus, who stated there are no times available on Monday. Patient will have to be a same day work-up.

## 2019-08-20 NOTE — Pre-Procedure Instructions (Signed)
   Your procedure is scheduled on Tuesday, May 11, from 07:30 AM- 09:15 AM.  Report to [facility] Entrance "A" at 05:30 A.M., and check in at the Admitting office.  Call this number if you have problems the morning of surgery:  [phone]   Remember:  Do not eat after midnight the night before your surgery.  You may drink clear liquids until 04:30 AM the morning of your surgery.    Clear liquids allowed are: Water, Non-Citrus Juices (without pulp), Carbonated Beverages, Clear Tea, Black Coffee Only, and Gatorade.     Take these medicines the morning of surgery with A SIP OF WATER : alprazolam (XANAX)  amLODipine (NORVASC) bictegravir-emtricitabine-tenofovir AF (BIKTARVY) busPIRone (BUSPAR) divalproex (DEPAKOTE ER) gabapentin (NEURONTIN) levETIRAcetam (KEPPRA) loratadine (CLARITIN)  omeprazole (PRILOSEC) phenytoin (DILANTIN) propranolol (INDERAL) Oxycodone PATADAY 0.2 % SOLN eye drops SYMBICORT inhaler Fluticasone-Salmeterol (ADVAIR DISKUS) inhaler  IF NEEDED: butalbital-acetaminophen-caffeine (FIORICET) sodium chloride (OCEAN) 0.65 % nasal spray albuterol (VENTOLIN HFA) inhaler  *Please bring all inhalers with you the day of surgery.*    As of today, STOP taking any Aspirin (unless otherwise instructed by your surgeon) and Aspirin containing products, Aleve, Naproxen, Ibuprofen, Motrin, Advil, Goody's, BC's, all herbal medications, fish oil, and all vitamins.   WHAT DO I DO ABOUT MY DIABETES MEDICATION?  Marland Kitchen The day of surgery, do not take liraglutide (VICTOZA).   Marland Kitchen Check your blood sugar the morning of your surgery when you wake up.  o If your blood sugar is less than 70 mg/dL, you will need to treat for low blood sugar: - Treat a low blood sugar (less than 70 mg/dL) with  cup of clear juice (cranberry or apple), 4 glucose tablets, OR glucose gel. - Recheck blood sugar in 15 minutes after treatment (to make sure it is greater than 70 mg/dL). If your blood sugar  is not greater than 70 mg/dL on recheck, call [phone] for further instructions.                     Do not wear jewelry, make up, or nail polish.            Do not wear lotions, powders, perfumes, or deodorant.            Do not shave 48 hours prior to surgery.              Do not bring valuables to the hospital.            [facility] is not responsible for any belongings or valuables.  Do NOT Smoke (Tobacco/Vapping) or drink Alcohol 24 hours prior to your procedure.  If you use a CPAP at night, you may bring all equipment for your overnight stay.   Contacts, glasses, dentures or bridgework may not be worn into surgery.      For patients admitted to the hospital, discharge time will be determined by your treatment team.   Patients discharged the day of surgery will not be allowed to drive home, and someone needs to stay with them for 24 hours.   Day of Surgery: Shower the morning of surgery.  Do not apply any deodorants/lotions.  Please wear clean clothes to the hospital/surgery center.   Remember to brush your teeth WITH YOUR REGULAR TOOTHPASTE.

## 2019-08-20 NOTE — Progress Notes (Signed)
Called patient to notify of no available PAT appointment times for Monday. No answer. Left VM to call back to do same day work-up phone call.

## 2019-08-23 ENCOUNTER — Telehealth: Payer: Self-pay

## 2019-08-23 NOTE — Telephone Encounter (Signed)
Called Lori Ford to inform her that her surgery for tomorrow 05/12 has been cancelled because she did not go get COVID tested nor did she come by the office to sign a hysterectomy statement. I advised her if she would like to be reschedule to call the office and make an appointment with Dr. Rip Harbour. She said she wish someone would have called her and told her. I told her she made this COVID appt with the pre-op nurse on 04/07 and we called her Friday and left her a voicemail because she had not shown up for her appointment yet. She said " Shut the "f" up "b" don't nobody check their voicemail everyday talking to me and leaving a voicemail is two different things" and hung up on me.

## 2019-08-24 ENCOUNTER — Ambulatory Visit (HOSPITAL_COMMUNITY)
Admission: RE | Admit: 2019-08-24 | Payer: Medicaid Other | Source: Home / Self Care | Admitting: Obstetrics and Gynecology

## 2019-08-24 ENCOUNTER — Encounter (HOSPITAL_COMMUNITY): Admission: RE | Payer: Self-pay | Source: Home / Self Care

## 2019-08-24 SURGERY — HYSTERECTOMY, VAGINAL
Anesthesia: Choice | Laterality: Bilateral

## 2019-09-01 ENCOUNTER — Ambulatory Visit: Payer: Medicaid Other | Attending: Family Medicine | Admitting: Licensed Clinical Social Worker

## 2019-09-01 ENCOUNTER — Ambulatory Visit: Payer: Medicaid Other | Attending: Family Medicine | Admitting: Family Medicine

## 2019-09-01 ENCOUNTER — Other Ambulatory Visit: Payer: Self-pay

## 2019-09-01 ENCOUNTER — Encounter: Payer: Self-pay | Admitting: Family Medicine

## 2019-09-01 VITALS — BP 146/92 | HR 71 | Temp 97.7°F | Ht 64.0 in | Wt 216.0 lb

## 2019-09-01 DIAGNOSIS — F339 Major depressive disorder, recurrent, unspecified: Secondary | ICD-10-CM

## 2019-09-01 DIAGNOSIS — F411 Generalized anxiety disorder: Secondary | ICD-10-CM

## 2019-09-01 DIAGNOSIS — J4540 Moderate persistent asthma, uncomplicated: Secondary | ICD-10-CM

## 2019-09-01 DIAGNOSIS — R350 Frequency of micturition: Secondary | ICD-10-CM | POA: Diagnosis not present

## 2019-09-01 DIAGNOSIS — N3941 Urge incontinence: Secondary | ICD-10-CM | POA: Diagnosis not present

## 2019-09-01 DIAGNOSIS — E119 Type 2 diabetes mellitus without complications: Secondary | ICD-10-CM

## 2019-09-01 DIAGNOSIS — G43709 Chronic migraine without aura, not intractable, without status migrainosus: Secondary | ICD-10-CM

## 2019-09-01 DIAGNOSIS — G40909 Epilepsy, unspecified, not intractable, without status epilepticus: Secondary | ICD-10-CM | POA: Diagnosis not present

## 2019-09-01 DIAGNOSIS — Z86011 Personal history of benign neoplasm of the brain: Secondary | ICD-10-CM | POA: Diagnosis not present

## 2019-09-01 DIAGNOSIS — R110 Nausea: Secondary | ICD-10-CM

## 2019-09-01 DIAGNOSIS — R42 Dizziness and giddiness: Secondary | ICD-10-CM

## 2019-09-01 DIAGNOSIS — F331 Major depressive disorder, recurrent, moderate: Secondary | ICD-10-CM

## 2019-09-01 LAB — POCT URINALYSIS DIP (CLINITEK)
Bilirubin, UA: NEGATIVE
Glucose, UA: NEGATIVE mg/dL
Ketones, POC UA: NEGATIVE mg/dL
Leukocytes, UA: NEGATIVE
Nitrite, UA: NEGATIVE
POC PROTEIN,UA: NEGATIVE
Spec Grav, UA: 1.025
Urobilinogen, UA: 0.2 U/dL
pH, UA: 7

## 2019-09-01 MED ORDER — PROCHLORPERAZINE MALEATE 5 MG PO TABS: 5.0000 mg | ORAL_TABLET | Freq: Four times a day (QID) | ORAL | 0 refills | Status: DC | PRN

## 2019-09-01 MED ORDER — HYDROXYZINE HCL 25 MG PO TABS: 25.0000 mg | ORAL_TABLET | Freq: Three times a day (TID) | ORAL | 3 refills | Status: DC | PRN

## 2019-09-01 MED ORDER — ALPRAZOLAM 1 MG PO TABS: ORAL_TABLET | ORAL | 0 refills | Status: DC

## 2019-09-01 NOTE — Progress Notes (Signed)
med refill/migraines med immovic was making her head hurt worse. need MRI and neurologist referral  Go over her GAD-7 and PHQ-9
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
## 2019-09-02 ENCOUNTER — Other Ambulatory Visit: Payer: Self-pay | Admitting: Family Medicine

## 2019-09-02 DIAGNOSIS — R319 Hematuria, unspecified: Secondary | ICD-10-CM

## 2019-09-02 LAB — CBC
Hematocrit: 40.1 % (ref 34.0–46.6)
Hemoglobin: 13.7 g/dL (ref 11.1–15.9)
MCH: 31.1 pg (ref 26.6–33.0)
MCHC: 34.2 g/dL (ref 31.5–35.7)
MCV: 91 fL (ref 79–97)
Platelets: 194 x10E3/uL (ref 150–450)
RBC: 4.41 x10E6/uL (ref 3.77–5.28)
RDW: 13.4 % (ref 11.7–15.4)
WBC: 6.9 x10E3/uL (ref 3.4–10.8)

## 2019-09-02 LAB — BASIC METABOLIC PANEL WITH GFR
BUN/Creatinine Ratio: 11 (ref 9–23)
BUN: 11 mg/dL (ref 6–24)
CO2: 20 mmol/L (ref 20–29)
Calcium: 9.2 mg/dL (ref 8.7–10.2)
Chloride: 105 mmol/L (ref 96–106)
Creatinine, Ser: 0.96 mg/dL (ref 0.57–1.00)
GFR calc Af Amer: 85 mL/min/1.73
GFR calc non Af Amer: 74 mL/min/1.73
Glucose: 101 mg/dL — ABNORMAL HIGH (ref 65–99)
Potassium: 4.3 mmol/L (ref 3.5–5.2)
Sodium: 140 mmol/L (ref 134–144)

## 2019-09-02 LAB — LIPID PANEL
Chol/HDL Ratio: 3.8 ratio (ref 0.0–4.4)
Cholesterol, Total: 143 mg/dL (ref 100–199)
HDL: 38 mg/dL — ABNORMAL LOW
LDL Chol Calc (NIH): 85 mg/dL (ref 0–99)
Triglycerides: 106 mg/dL (ref 0–149)
VLDL Cholesterol Cal: 20 mg/dL (ref 5–40)

## 2019-09-02 LAB — MICROALBUMIN / CREATININE URINE RATIO
Creatinine, Urine: 142.1 mg/dL
Microalb/Creat Ratio: 4 mg/g{creat} (ref 0–29)
Microalbumin, Urine: 5.4 ug/mL

## 2019-09-08 ENCOUNTER — Encounter: Payer: Medicaid Other | Attending: Endocrinology | Admitting: Dietician

## 2019-09-08 ENCOUNTER — Other Ambulatory Visit: Payer: Self-pay

## 2019-09-08 ENCOUNTER — Encounter: Payer: Self-pay | Admitting: Dietician

## 2019-09-08 DIAGNOSIS — E669 Obesity, unspecified: Secondary | ICD-10-CM | POA: Insufficient documentation

## 2019-09-08 DIAGNOSIS — E1169 Type 2 diabetes mellitus with other specified complication: Secondary | ICD-10-CM | POA: Diagnosis not present

## 2019-09-08 NOTE — Progress Notes (Signed)
Diabetes Self-Management Education  Visit Type: First/Initial  09/08/2019  [name], identified by name and date of birth, is a 44 y.o. female with a diagnosis of Diabetes: Type 2.   ASSESSMENT  Patient states she would like to improve her cholesterol levels and lose weight. Checks blood sugar twice daily, averaging ~119 fasting and between 120-210 at night before bed. Typical meal pattern is 2-3 meals per day (may skip breakfast) plus ~2 snacks throughout the day. States she likes to snack, but prefers chips and snack bars versus sweets. States she does not care for meat and is interested in non-meat sources of protein. States she does not drink enough fluid and sometimes feels dehydrated.   Diabetes Self-Management Education - 09/08/19 1004      Visit Information   Visit Type  First/Initial      Initial Visit   Diabetes Type  Type 2    Are you currently following a meal plan?  No    Are you taking your medications as prescribed?  Yes    Date Diagnosed  age 57      Health Coping   How would you rate your overall health?  Poor      Psychosocial Assessment   Patient Belief/Attitude about Diabetes  Defeat/Burnout    Self-care barriers  None    Patient Concerns  Nutrition/Meal planning    Special Needs  None    Preferred Learning Style  No preference indicated    Learning Readiness  Ready    How often do you need to have someone help you when you read instructions, pamphlets, or other written materials from your doctor or pharmacy?  1 - Never    What is the last grade level you completed in school?  college      Complications   Last HgB A1C per patient/outside source  5.6 %   March 2021   How often do you check your blood sugar?  1-2 times/day    Fasting Blood glucose range (mg/dL)  70-129    Postprandial Blood glucose range (mg/dL)  70-129;130-179;180-200    Have you had a dilated eye exam in the past 12 months?  Yes    Have you had a dental exam in the past 12  months?  No    Are you checking your feet?  Yes    How many days per week are you checking your feet?  7      Dietary Intake   Breakfast  bacon + grits + toast   or biscuit + mayo + tomato;  or chips   Snack (morning)  pack of mini muffins    Lunch  pastrami sandwich   egg rolls;  or Pope[employer]'s chicken sandwich entree   Snack (afternoon)  chips   crackers + cheese   Dinner  smothered pork chops + mac & cheese + green beans + rolls   or crab legs & lobster   Beverage(s)  Hint, sweet tea, lemonade, coffee w/ cream & sugar      Exercise   Exercise Type  ADL's;Light (walking / raking leaves)    How many days per week to you exercise?  3    How many minutes per day do you exercise?  20    Total minutes per week of exercise  60      Patient Education   Previous Diabetes Education  No    Nutrition management   Role of diet in the treatment of diabetes  and the relationship between the three main macronutrients and blood glucose level;Information on hints to eating out and maintain blood glucose control.;Meal options for control of blood glucose level and chronic complications.      Individualized Goals (developed by patient)   Nutrition  General guidelines for healthy choices and portions discussed      Outcomes   Expected Outcomes  Demonstrated interest in learning. Expect positive outcomes    Future DMSE  PRN       Individualized Plan for Diabetes Self-Management Training:  Learning Objective:  Patient will have a greater understanding of diabetes self-management. Patient education plan is to attend individual and/or group sessions per assessed needs and concerns.   Discussed heart-healthy balanced meal and snack ideas. Stressed the importance of vegetables and whole grains for fiber. Reviewed non-meat sources of protein per patient interest, including seafood, [place] yogurt, eggs, beans, nuts, etc. and the importance of including protein with meals/snacks for blood sugar management.  Discussed importance of adequate fluid intake for hydration, especially plain water, and tips for increasing intake throughout the day.   Expected Outcomes:  Demonstrated interest in learning. Expect positive outcomes  Education material provided: My Plate and Snack sheet, Meal Ideas, Breakfast Ideas  If problems or questions, patient to contact team via:  Phone and Email  Future DSME appointment: PRN

## 2019-09-10 ENCOUNTER — Other Ambulatory Visit: Payer: Self-pay | Admitting: Physician Assistant

## 2019-09-10 DIAGNOSIS — I10 Essential (primary) hypertension: Secondary | ICD-10-CM

## 2019-09-15 ENCOUNTER — Ambulatory Visit (HOSPITAL_COMMUNITY)
Admission: RE | Admit: 2019-09-15 | Discharge: 2019-09-15 | Disposition: A | Discharge status: Home / Self Care | Payer: Medicaid Other | Source: Ambulatory Visit | Attending: Family Medicine | Admitting: Family Medicine

## 2019-09-15 ENCOUNTER — Other Ambulatory Visit: Payer: Self-pay

## 2019-09-15 DIAGNOSIS — Z86011 Personal history of benign neoplasm of the brain: Secondary | ICD-10-CM

## 2019-09-15 DIAGNOSIS — G40909 Epilepsy, unspecified, not intractable, without status epilepticus: Secondary | ICD-10-CM

## 2019-09-15 DIAGNOSIS — G43709 Chronic migraine without aura, not intractable, without status migrainosus: Secondary | ICD-10-CM

## 2019-09-15 MED FILL — VICTOZA 18 MG/3 ML INJECT P: 18 | 30 days supply | Qty: 9 | Fill #0

## 2019-09-16 ENCOUNTER — Telehealth: Payer: Self-pay

## 2019-09-16 NOTE — Telephone Encounter (Signed)
Pt is here today instructed tu repeat UA as per her PCP. Referred her to the lab

## 2019-09-17 ENCOUNTER — Other Ambulatory Visit: Payer: Self-pay

## 2019-09-17 ENCOUNTER — Encounter (HOSPITAL_COMMUNITY): Payer: Self-pay

## 2019-09-17 ENCOUNTER — Emergency Department (HOSPITAL_COMMUNITY): Payer: Medicaid Other

## 2019-09-17 ENCOUNTER — Emergency Department (HOSPITAL_COMMUNITY)
Admission: EM | Admit: 2019-09-17 | Discharge: 2019-09-17 | Disposition: A | Discharge status: Home / Self Care | Payer: Medicaid Other | Attending: Emergency Medicine | Admitting: Emergency Medicine

## 2019-09-17 DIAGNOSIS — E119 Type 2 diabetes mellitus without complications: Secondary | ICD-10-CM | POA: Diagnosis not present

## 2019-09-17 DIAGNOSIS — O039 Complete or unspecified spontaneous abortion without complication: Secondary | ICD-10-CM

## 2019-09-17 DIAGNOSIS — Z8541 Personal history of malignant neoplasm of cervix uteri: Secondary | ICD-10-CM | POA: Diagnosis not present

## 2019-09-17 DIAGNOSIS — Z21 Asymptomatic human immunodeficiency virus [HIV] infection status: Secondary | ICD-10-CM | POA: Insufficient documentation

## 2019-09-17 DIAGNOSIS — N730 Acute parametritis and pelvic cellulitis: Secondary | ICD-10-CM

## 2019-09-17 DIAGNOSIS — R52 Pain, unspecified: Secondary | ICD-10-CM

## 2019-09-17 DIAGNOSIS — R102 Pelvic and perineal pain: Secondary | ICD-10-CM

## 2019-09-17 DIAGNOSIS — I10 Essential (primary) hypertension: Secondary | ICD-10-CM | POA: Diagnosis not present

## 2019-09-17 DIAGNOSIS — J45909 Unspecified asthma, uncomplicated: Secondary | ICD-10-CM | POA: Diagnosis not present

## 2019-09-17 DIAGNOSIS — Z79899 Other long term (current) drug therapy: Secondary | ICD-10-CM | POA: Insufficient documentation

## 2019-09-17 DIAGNOSIS — F17210 Nicotine dependence, cigarettes, uncomplicated: Secondary | ICD-10-CM | POA: Diagnosis not present

## 2019-09-17 DIAGNOSIS — Z91040 Latex allergy status: Secondary | ICD-10-CM | POA: Diagnosis not present

## 2019-09-17 DIAGNOSIS — Z86011 Personal history of benign neoplasm of the brain: Secondary | ICD-10-CM | POA: Diagnosis not present

## 2019-09-17 DIAGNOSIS — N739 Female pelvic inflammatory disease, unspecified: Secondary | ICD-10-CM | POA: Diagnosis not present

## 2019-09-17 LAB — URINALYSIS, ROUTINE W REFLEX MICROSCOPIC
Bilirubin Urine: NEGATIVE
Glucose, UA: NEGATIVE mg/dL
Ketones, ur: NEGATIVE mg/dL
Leukocytes,Ua: NEGATIVE
Nitrite: NEGATIVE
Protein, ur: NEGATIVE mg/dL
Specific Gravity, Urine: 1.015 (ref 1.005–1.030)
pH: 6 (ref 5.0–8.0)

## 2019-09-17 LAB — COMPREHENSIVE METABOLIC PANEL
ALT: 18 U/L (ref 0–44)
AST: 16 U/L (ref 15–41)
Albumin: 3.5 g/dL (ref 3.5–5.0)
Alkaline Phosphatase: 58 U/L (ref 38–126)
Anion gap: 9 (ref 5–15)
BUN: 10 mg/dL (ref 6–20)
CO2: 23 mmol/L (ref 22–32)
Calcium: 8.6 mg/dL — ABNORMAL LOW (ref 8.9–10.3)
Chloride: 105 mmol/L (ref 98–111)
Creatinine, Ser: 0.84 mg/dL (ref 0.44–1.00)
GFR calc Af Amer: >60 mL/min (ref >60)
GFR calc non Af Amer: >60 mL/min (ref >60)
Glucose, Bld: 122 mg/dL — ABNORMAL HIGH (ref 70–99)
Potassium: 3.8 mmol/L (ref 3.5–5.1)
Sodium: 137 mmol/L (ref 135–145)
Total Bilirubin: 0.6 mg/dL (ref 0.3–1.2)
Total Protein: 6.6 g/dL (ref 6.5–8.1)

## 2019-09-17 LAB — CBC
HCT: 40.7 % (ref 36.0–46.0)
Hemoglobin: 13.2 g/dL (ref 12.0–15.0)
MCH: 31.1 pg (ref 26.0–34.0)
MCHC: 32.4 g/dL (ref 30.0–36.0)
MCV: 96 fL (ref 80.0–100.0)
Platelets: 230 10*3/uL (ref 150–400)
RBC: 4.24 MIL/uL (ref 3.87–5.11)
RDW: 14.1 % (ref 11.5–15.5)
WBC: 4.8 10*3/uL (ref 4.0–10.5)
nRBC: 0 % (ref 0.0–0.2)

## 2019-09-17 LAB — WET PREP, GENITAL
Sperm: NONE SEEN
Trich, Wet Prep: NONE SEEN
Yeast Wet Prep HPF POC: NONE SEEN

## 2019-09-17 LAB — I-STAT BETA HCG BLOOD, ED (MC, WL, AP ONLY): I-stat hCG, quantitative: <5 m[IU]/mL (ref <5)

## 2019-09-17 LAB — LIPASE, BLOOD: Lipase: 25 U/L (ref 11–51)

## 2019-09-17 MED ORDER — OXYCODONE HCL 5 MG PO TABS
5.0000 mg | ORAL_TABLET | Freq: Once | ORAL | Status: AC
  Administered 2019-09-17: 5 mg via ORAL
  Filled 2019-09-17: qty 1

## 2019-09-17 MED ORDER — SODIUM CHLORIDE 0.9% FLUSH: 3.0000 mL | Freq: Once | INTRAVENOUS | Status: DC

## 2019-09-17 MED ORDER — LIDOCAINE 4 % EX CREA: 1.0000 "application " | TOPICAL_CREAM | Freq: Three times a day (TID) | CUTANEOUS | 0 refills | Status: AC | PRN

## 2019-09-17 MED ORDER — AZITHROMYCIN 250 MG PO TABS
500.0000 mg | ORAL_TABLET | Freq: Once | ORAL | Status: AC
  Administered 2019-09-17: 500 mg via ORAL
  Filled 2019-09-17: qty 2

## 2019-09-17 MED ORDER — AZITHROMYCIN 500 MG PO TABS
500.0000 mg | ORAL_TABLET | Freq: Every day | ORAL | 0 refills | Status: AC
Start: 2019-09-17 — End: 2019-09-23

## 2019-09-17 MED ORDER — LIDOCAINE 5 % EX PTCH
1.0000 | MEDICATED_PATCH | CUTANEOUS | Status: DC
  Administered 2019-09-17: 1 via TRANSDERMAL
  Filled 2019-09-17: qty 1

## 2019-09-17 NOTE — Discharge Instructions (Signed)
We are going to start treating you for a pelvic infection.  Due to your antibiotic allergies, the only antibiotic that we can give that would be appropriate would be azithromycin.  Start taking this tomorrow as you received the first dose in the emergency department today.  You can apply lidocaine cream to areas of pain as well as heating pads and gentle stretching.  Follow-up with your PCP on Monday as scheduled for reevaluation of your symptoms.  Return to the emergency department if any concerning signs or symptoms develop such as fevers, vomiting, severe uncontrolled pain, worsening bleeding or loss of consciousness.
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
## 2019-09-17 NOTE — ED Triage Notes (Signed)
Pt presents with abd/back pain and heavy vaginal bleeding since having a miscarriage 2 weeks ago. OBGYN sent her here for further evaluation. She is to see her OBGYN for possible [facility] next Friday

## 2019-09-20 ENCOUNTER — Other Ambulatory Visit: Payer: Self-pay | Admitting: Family Medicine

## 2019-09-20 DIAGNOSIS — F411 Generalized anxiety disorder: Secondary | ICD-10-CM

## 2019-09-20 LAB — GC/CHLAMYDIA PROBE AMP (~~LOC~~) NOT AT ARMC
Chlamydia: NEGATIVE
Comment: NEGATIVE
Comment: NORMAL
Neisseria Gonorrhea: NEGATIVE

## 2019-09-22 ENCOUNTER — Other Ambulatory Visit: Payer: Self-pay | Admitting: Family Medicine

## 2019-09-22 ENCOUNTER — Other Ambulatory Visit: Payer: Medicaid Other

## 2019-09-22 DIAGNOSIS — R110 Nausea: Secondary | ICD-10-CM

## 2019-09-28 ENCOUNTER — Ambulatory Visit: Payer: Medicaid Other | Admitting: Endocrinology

## 2019-09-29 ENCOUNTER — Institutional Professional Consult (permissible substitution): Payer: Medicaid Other | Admitting: Critical Care Medicine
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
## 2019-10-01 ENCOUNTER — Other Ambulatory Visit: Payer: Self-pay

## 2019-10-05 NOTE — BH Specialist Note (Signed)
Integrated Behavioral Health Initial Visit  MRN: [id number] Name: Lori Ford  Number of [place] Clinician visits:: 1/6 Session Start time: 10:25 AM  Session End time: 11:00 AM Total time: 35   Type of Service: [place] Interpretor:No. Interpretor Name and Language: NA   Warm Hand Off Completed.       SUBJECTIVE: Lori Ford is a 43 y.o. female accompanied by self Patient was referred by Dr. Chapman Fitch for depression and anxiety. Patient reports the following symptoms/concerns: Pt reports difficulty managing depression and anxiety. Symptoms include difficulty sleeping/eating, crying episodes, low energy, irritability, and panic attacks Duration of problem: "for months"; Severity of problem: severe  OBJECTIVE: Mood: Anxious and Affect: Depressed Risk of harm to self or others: No plan to harm self or others  LIFE CONTEXT: Family and Social: Pt receives limited support School/Work: Pt has medicaid Self-Care: Pt received medication management and therapy approximately 2 years ago with Dr. Altamese Heath on [place]. Smokes marijuana to cope with stressors Life Changes: Pt reports difficulty managing depression and anxiety  GOALS ADDRESSED: Patient will: 1. Reduce symptoms of: anxiety and depression 2. Increase knowledge and/or ability of: coping skills and healthy habits  3. Demonstrate ability to: Increase healthy adjustment to current life circumstances and Increase adequate support systems for patient/family  INTERVENTIONS: Interventions utilized: Solution-Focused Strategies, Supportive Counseling, Psychoeducation and/or Health Education and Link to [employer]  Standardized Assessments completed: GAD-7 and PHQ 2&9  ASSESSMENT: Patient currently experiencing increase in depression and anxiety symptoms triggered by psychosocial stressors and grief. Denies SI/HI   Patient may benefit from medication management and therapy  to assist in management of symptoms. Therapeutic strategies discussed, validation and support provided.   PLAN: 1. Follow up with behavioral health clinician on : Contact LCSW with additional behavioral health and/or resource needs 2. Behavioral recommendations: Utilize strategies discussed and follow up with referral to psychiatry 3. Referral(s): [facility] (In Clinic) 4. "From scale of 1-10, how likely are you to follow plan?":   Rebekah [place], LCSW 10/05/2019 4:43 PM

## 2019-10-08 ENCOUNTER — Other Ambulatory Visit: Payer: Self-pay | Admitting: Family Medicine

## 2019-10-12 ENCOUNTER — Ambulatory Visit: Payer: Medicaid Other | Admitting: Endocrinology
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
## 2019-10-19 ENCOUNTER — Other Ambulatory Visit: Payer: Self-pay | Admitting: Obstetrics and Gynecology

## 2019-10-19 DIAGNOSIS — N921 Excessive and frequent menstruation with irregular cycle: Secondary | ICD-10-CM

## 2019-10-26 ENCOUNTER — Other Ambulatory Visit: Payer: Self-pay | Admitting: Family Medicine

## 2019-10-26 DIAGNOSIS — J4520 Mild intermittent asthma, uncomplicated: Secondary | ICD-10-CM

## 2019-10-26 MED FILL — ADVAIR 100/50 DISKUS: 100-50 | 30 days supply | Qty: 60 | Fill #0

## 2019-10-26 NOTE — Telephone Encounter (Signed)
Requested Prescriptions  Pending Prescriptions Disp Refills  . ADVAIR DISKUS 100-50 MCG/DOSE AEPB [Pharmacy Med Name: ADVAIR 100/50 DISKUS 100-50 Aerosol] 60 each 3    Sig: INHALE 1 PUFF INTO THE LUNGS DAILY.     Pulmonology:  Combination Products Passed - 10/26/2019  1:43 PM      Passed - Valid encounter within last 12 months    Recent Outpatient Visits          1 month ago Chronic migraine without aura without status migrainosus, not intractable   [place] Fulp, [place], MD   4 months ago Memory loss due to medical condition   [place] Fulp, [place], MD   6 months ago Major depression, recurrent, chronic ([place])   [place] Fulp, [place], MD   7 months ago Midepigastric pain   [place], [place], [place]   8 months ago Mild intermittent asthma with exacerbation   [facility] [place], [place], MD

## 2019-11-01 MED FILL — VICTOZA 18 MG/3 ML INJECT P: 18 | 30 days supply | Qty: 9 | Fill #0

## 2019-11-04 ENCOUNTER — Other Ambulatory Visit: Payer: Self-pay | Admitting: Family Medicine

## 2019-11-04 DIAGNOSIS — F411 Generalized anxiety disorder: Secondary | ICD-10-CM

## 2019-11-04 NOTE — Telephone Encounter (Signed)
Requested  medications are  due for refill today questionable, ordered 3 x day prn and given 30.  Requested medications are on the active medication list yes  Last refill 7/13  Last visit 5/19   Notes to clinic Unsure from visit note if pt is to stay on this medication.

## 2019-11-09 ENCOUNTER — Other Ambulatory Visit (HOSPITAL_COMMUNITY): Payer: Self-pay | Admitting: Family Medicine

## 2019-11-10 ENCOUNTER — Other Ambulatory Visit: Payer: Self-pay | Admitting: Family Medicine

## 2019-11-10 DIAGNOSIS — F419 Anxiety disorder, unspecified: Secondary | ICD-10-CM

## 2019-11-10 NOTE — Telephone Encounter (Signed)
Requested Prescriptions  Pending Prescriptions Disp Refills  . busPIRone (BUSPAR) 15 MG tablet [Pharmacy Med Name: BUSPIRONE 15MG  TABLETS] 90 tablet 3    Sig: TAKE 1 TABLET(15 MG) BY MOUTH TWICE DAILY     Psychiatry: Anxiolytics/Hypnotics - Non-controlled Passed - 11/10/2019  3:30 AM      Passed - Valid encounter within last 6 months    Recent Outpatient Visits          2 months ago Chronic migraine without aura without status migrainosus, not intractable   [place] Fulp, [place], MD   4 months ago Memory loss due to medical condition   [place] Fulp, [place], MD   6 months ago Major depression, recurrent, chronic ([place])   [place] Fulp, [place], MD   7 months ago Midepigastric pain   [place], [place], [place]   8 months ago Mild intermittent asthma with exacerbation   [facility] [place], [place], MD

## 2019-11-12 ENCOUNTER — Other Ambulatory Visit: Payer: Self-pay | Admitting: Family Medicine

## 2019-11-12 DIAGNOSIS — R1013 Epigastric pain: Secondary | ICD-10-CM

## 2019-11-16 ENCOUNTER — Institutional Professional Consult (permissible substitution): Payer: Medicaid Other | Admitting: Pulmonary Disease

## 2019-11-22 ENCOUNTER — Other Ambulatory Visit: Payer: Self-pay | Admitting: Family Medicine

## 2019-11-22 DIAGNOSIS — I10 Essential (primary) hypertension: Secondary | ICD-10-CM

## 2019-11-22 NOTE — Telephone Encounter (Signed)
Requested Prescriptions  Pending Prescriptions Disp Refills   hydrochlorothiazide (HYDRODIURIL) 25 MG tablet [Pharmacy Med Name: HYDROCHLOROTHIAZIDE 25MG  TABLETS] 30 tablet 3    Sig: TAKE 1 TABLET(25 MG) BY MOUTH DAILY     Cardiovascular: Diuretics - Thiazide Failed - 11/22/2019  6:54 PM      Failed - Ca in normal range and within 360 days    Calcium  Date Value Ref Range Status  09/17/2019 8.6 (L) 8.9 - 10.3 mg/dL Final   Calcium, Ion  Date Value Ref Range Status  03/22/2019 1.13 (L) 1.15 - 1.40 mmol/L Final         Passed - Cr in normal range and within 360 days    Creat  Date Value Ref Range Status  01/20/2019 0.95 0.50 - 1.10 mg/dL Final   Creatinine, Ser  Date Value Ref Range Status  09/17/2019 0.84 0.44 - 1.00 mg/dL Final         Passed - K in normal range and within 360 days    Potassium  Date Value Ref Range Status  09/17/2019 3.8 3.5 - 5.1 mmol/L Final         Passed - Na in normal range and within 360 days    Sodium  Date Value Ref Range Status  09/17/2019 137 135 - 145 mmol/L Final  09/01/2019 140 134 - 144 mmol/L Final         Passed - Last BP in normal range    BP Readings from Last 1 Encounters:  09/17/19 136/89         Passed - Valid encounter within last 6 months    Recent Outpatient Visits          2 months ago Chronic migraine without aura without status migrainosus, not intractable   [place] Fulp, [place], MD   5 months ago Memory loss due to medical condition   [place], [place], MD   7 months ago Major depression, recurrent, chronic ([place])   [place] Fulp, [place], MD   8 months ago Midepigastric pain   [place], [place], [place]   9 months ago Mild intermittent asthma with exacerbation   [facility] [place], [place], MD

## 2019-12-14 ENCOUNTER — Other Ambulatory Visit: Payer: Self-pay | Admitting: Endocrinology

## 2019-12-14 DIAGNOSIS — IMO0002 Reserved for concepts with insufficient information to code with codable children: Secondary | ICD-10-CM

## 2019-12-17 ENCOUNTER — Other Ambulatory Visit: Payer: Self-pay | Admitting: Endocrinology

## 2019-12-17 ENCOUNTER — Other Ambulatory Visit: Payer: Self-pay | Admitting: Family Medicine

## 2019-12-17 DIAGNOSIS — IMO0002 Reserved for concepts with insufficient information to code with codable children: Secondary | ICD-10-CM

## 2019-12-17 MED FILL — ADVAIR 100/50 DISKUS: 100-50 | 30 days supply | Qty: 60 | Fill #0

## 2019-12-17 NOTE — Telephone Encounter (Signed)
Requested medication (s) are due for refill today: yes   Requested medication (s) are on the active medication list: yes   Last refill:  10/08/2019  Future visit scheduled: no  Notes to clinic:  medication was filled by a different provider  Review for refill   Requested Prescriptions  Pending Prescriptions Disp Refills   [place] 18 MG/3ML SOPN [Pharmacy Med Name: VICTOZA 2-PAK 18 MG/3 ML PEN]  1    Sig: INJECT 1.2 MG SUB-Q ONCE DAILY      Endocrinology:  Diabetes - GLP-1 Receptor Agonists Passed - 12/17/2019  9:00 AM      Passed - HBA1C is between 0 and 7.9 and within 180 days    HbA1c, POC (controlled diabetic range)  Date Value Ref Range Status  03/18/2019 5.9 0.0 - 7.0 % Final   Hgb A1c MFr Bld  Date Value Ref Range Status  06/23/2019 5.6 4.8 - 5.6 % Final    Comment:             Prediabetes: 5.7 - 6.4          Diabetes: >6.4          Glycemic control for adults with diabetes: <7.0           Passed - Valid encounter within last 6 months    Recent Outpatient Visits           3 months ago Chronic migraine without aura without status migrainosus, not intractable   [place] Fulp, [place], MD   5 months ago Memory loss due to medical condition   [place], [place], MD   7 months ago Major depression, recurrent, chronic ([place])   [place] Fulp, [place], MD   9 months ago Midepigastric pain   [place], [place], [place]   10 months ago Mild intermittent asthma with exacerbation   [facility] [place], [place], MD

## 2019-12-18 ENCOUNTER — Other Ambulatory Visit: Payer: Self-pay | Admitting: Family Medicine

## 2019-12-18 DIAGNOSIS — IMO0002 Reserved for concepts with insufficient information to code with codable children: Secondary | ICD-10-CM

## 2019-12-21 ENCOUNTER — Other Ambulatory Visit (HOSPITAL_COMMUNITY): Payer: Self-pay | Admitting: Endocrinology

## 2019-12-27 ENCOUNTER — Other Ambulatory Visit: Payer: Self-pay | Admitting: Family Medicine

## 2019-12-27 DIAGNOSIS — IMO0002 Reserved for concepts with insufficient information to code with codable children: Secondary | ICD-10-CM

## 2020-01-06 ENCOUNTER — Institutional Professional Consult (permissible substitution): Payer: Medicaid Other | Admitting: Pulmonary Disease

## 2020-01-21 ENCOUNTER — Other Ambulatory Visit: Payer: Self-pay | Admitting: Family Medicine

## 2020-01-21 DIAGNOSIS — IMO0002 Reserved for concepts with insufficient information to code with codable children: Secondary | ICD-10-CM

## 2020-01-21 DIAGNOSIS — E1165 Type 2 diabetes mellitus with hyperglycemia: Secondary | ICD-10-CM

## 2020-01-21 MED FILL — LANTUS 100 UNITS/ML VIAL: 100 | 28 days supply | Qty: 10 | Fill #0

## 2020-01-21 NOTE — Telephone Encounter (Signed)
Requested medication (s) are due for refill today: Yes  Requested medication (s) are on the active medication list: Yes  Last refill:  12/21/19  Future visit scheduled: No  Notes to clinic:  Last filled by Dr. Dwyane Dee.    Requested Prescriptions  Pending Prescriptions Disp Refills   [place] 26 MG/3ML SOPN [Pharmacy Med Name: VICTOZA 2-PAK 18 MG/3 ML PEN]  1    Sig: INJECT 1.2 MG SUB-Q ONCE DAILY      Endocrinology:  Diabetes - GLP-1 Receptor Agonists Failed - 01/21/2020 11:07 AM      Failed - HBA1C is between 0 and 7.9 and within 180 days    HbA1c, POC (controlled diabetic range)  Date Value Ref Range Status  03/18/2019 5.9 0.0 - 7.0 % Final   Hgb A1c MFr Bld  Date Value Ref Range Status  06/23/2019 5.6 4.8 - 5.6 % Final    Comment:             Prediabetes: 5.7 - 6.4          Diabetes: >6.4          Glycemic control for adults with diabetes: <7.0           Passed - Valid encounter within last 6 months    Recent Outpatient Visits           4 months ago Chronic migraine without aura without status migrainosus, not intractable   [place] Fulp, [place], MD   7 months ago Memory loss due to medical condition   [place], MD   9 months ago Major depression, recurrent, chronic ([place])   [place] Fulp, [place], MD   10 months ago Midepigastric pain   [place], [place], [place]   11 months ago Mild intermittent asthma with exacerbation   [place], [place], MD

## 2020-01-25 ENCOUNTER — Other Ambulatory Visit: Payer: Self-pay | Admitting: Physician Assistant

## 2020-01-25 DIAGNOSIS — E1165 Type 2 diabetes mellitus with hyperglycemia: Secondary | ICD-10-CM

## 2020-01-25 DIAGNOSIS — IMO0002 Reserved for concepts with insufficient information to code with codable children: Secondary | ICD-10-CM

## 2020-02-03 ENCOUNTER — Other Ambulatory Visit (HOSPITAL_COMMUNITY): Payer: Self-pay | Admitting: Family Medicine

## 2020-02-07 ENCOUNTER — Other Ambulatory Visit: Payer: Self-pay

## 2020-02-07 ENCOUNTER — Emergency Department (HOSPITAL_COMMUNITY)
Admission: EM | Admit: 2020-02-07 | Discharge: 2020-02-07 | Disposition: A | Discharge status: Home / Self Care | Payer: Medicaid Other | Attending: Emergency Medicine | Admitting: Emergency Medicine

## 2020-02-07 ENCOUNTER — Emergency Department (HOSPITAL_COMMUNITY): Payer: Medicaid Other

## 2020-02-07 ENCOUNTER — Encounter (HOSPITAL_COMMUNITY): Payer: Self-pay | Admitting: Emergency Medicine

## 2020-02-07 DIAGNOSIS — E119 Type 2 diabetes mellitus without complications: Secondary | ICD-10-CM | POA: Diagnosis not present

## 2020-02-07 DIAGNOSIS — J45909 Unspecified asthma, uncomplicated: Secondary | ICD-10-CM | POA: Insufficient documentation

## 2020-02-07 DIAGNOSIS — Z7951 Long term (current) use of inhaled steroids: Secondary | ICD-10-CM | POA: Insufficient documentation

## 2020-02-07 DIAGNOSIS — Z91040 Latex allergy status: Secondary | ICD-10-CM | POA: Insufficient documentation

## 2020-02-07 DIAGNOSIS — F17210 Nicotine dependence, cigarettes, uncomplicated: Secondary | ICD-10-CM | POA: Insufficient documentation

## 2020-02-07 DIAGNOSIS — G43509 Persistent migraine aura without cerebral infarction, not intractable, without status migrainosus: Secondary | ICD-10-CM | POA: Insufficient documentation

## 2020-02-07 DIAGNOSIS — Z79899 Other long term (current) drug therapy: Secondary | ICD-10-CM | POA: Diagnosis not present

## 2020-02-07 DIAGNOSIS — I10 Essential (primary) hypertension: Secondary | ICD-10-CM | POA: Insufficient documentation

## 2020-02-07 DIAGNOSIS — R519 Headache, unspecified: Secondary | ICD-10-CM | POA: Diagnosis present

## 2020-02-07 DIAGNOSIS — Z8541 Personal history of malignant neoplasm of cervix uteri: Secondary | ICD-10-CM | POA: Diagnosis not present

## 2020-02-07 DIAGNOSIS — Z794 Long term (current) use of insulin: Secondary | ICD-10-CM | POA: Insufficient documentation

## 2020-02-07 MED ORDER — PROCHLORPERAZINE EDISYLATE 10 MG/2ML IJ SOLN
10.0000 mg | Freq: Once | INTRAMUSCULAR | Status: AC
  Administered 2020-02-07: 10 mg via INTRAVENOUS
  Filled 2020-02-07: qty 2

## 2020-02-07 MED ORDER — SODIUM CHLORIDE 0.9 % IV BOLUS
1000.0000 mL | Freq: Once | INTRAVENOUS | Status: AC
  Administered 2020-02-07: 1000 mL via INTRAVENOUS

## 2020-02-07 MED ORDER — DIPHENHYDRAMINE HCL 50 MG/ML IJ SOLN
25.0000 mg | Freq: Once | INTRAMUSCULAR | Status: AC
  Administered 2020-02-07: 25 mg via INTRAVENOUS
  Filled 2020-02-07: qty 1

## 2020-02-07 MED ORDER — KETOROLAC TROMETHAMINE 30 MG/ML IJ SOLN
15.0000 mg | Freq: Once | INTRAMUSCULAR | Status: AC
  Administered 2020-02-07: 15 mg via INTRAVENOUS
  Filled 2020-02-07: qty 1

## 2020-02-07 NOTE — ED Triage Notes (Signed)
Pt c/o of migraine x5 days. Pt has hx of migraines and ran out of her amovig medication 2 months ago.

## 2020-02-07 NOTE — Discharge Instructions (Addendum)
As discussed, your CT imaging is negative today including no evidence for an ear or mastoid sinus infection (where you pain is behind the left ear).  You may continue taking the promethazine prescribed by your neurologist if your nausea returns.  There is not a good alternative medicine that you can safely take for migraine that will not interfere with other medicines you are taking or that you have not already tried without improvement.  You may want to consider trying an over the counter migraine remedy, specifically one that contains caffeine such as Excedrin migraine.  Plan a followup with your primary MD or your neurologist if your symptoms return or worsen.
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
## 2020-02-14 ENCOUNTER — Other Ambulatory Visit: Payer: Self-pay | Admitting: Family Medicine

## 2020-02-14 ENCOUNTER — Other Ambulatory Visit (HOSPITAL_COMMUNITY): Payer: Self-pay | Admitting: Family Medicine

## 2020-02-14 DIAGNOSIS — I10 Essential (primary) hypertension: Secondary | ICD-10-CM

## 2020-02-15 ENCOUNTER — Other Ambulatory Visit: Payer: Self-pay

## 2020-02-15 ENCOUNTER — Emergency Department (HOSPITAL_COMMUNITY): Payer: Medicaid Other

## 2020-02-15 ENCOUNTER — Encounter (HOSPITAL_COMMUNITY): Payer: Self-pay

## 2020-02-15 ENCOUNTER — Emergency Department (HOSPITAL_COMMUNITY)
Admission: EM | Admit: 2020-02-15 | Discharge: 2020-02-15 | Disposition: A | Discharge status: Home / Self Care | Payer: Medicaid Other | Attending: Emergency Medicine | Admitting: Emergency Medicine

## 2020-02-15 DIAGNOSIS — Z8541 Personal history of malignant neoplasm of cervix uteri: Secondary | ICD-10-CM | POA: Diagnosis not present

## 2020-02-15 DIAGNOSIS — I10 Essential (primary) hypertension: Secondary | ICD-10-CM | POA: Diagnosis not present

## 2020-02-15 DIAGNOSIS — Z7951 Long term (current) use of inhaled steroids: Secondary | ICD-10-CM | POA: Insufficient documentation

## 2020-02-15 DIAGNOSIS — J45909 Unspecified asthma, uncomplicated: Secondary | ICD-10-CM | POA: Diagnosis not present

## 2020-02-15 DIAGNOSIS — Z79899 Other long term (current) drug therapy: Secondary | ICD-10-CM | POA: Diagnosis not present

## 2020-02-15 DIAGNOSIS — E1169 Type 2 diabetes mellitus with other specified complication: Secondary | ICD-10-CM | POA: Insufficient documentation

## 2020-02-15 DIAGNOSIS — S0083XA Contusion of other part of head, initial encounter: Secondary | ICD-10-CM | POA: Insufficient documentation

## 2020-02-15 DIAGNOSIS — Z91040 Latex allergy status: Secondary | ICD-10-CM | POA: Insufficient documentation

## 2020-02-15 DIAGNOSIS — Z794 Long term (current) use of insulin: Secondary | ICD-10-CM | POA: Insufficient documentation

## 2020-02-15 DIAGNOSIS — F17210 Nicotine dependence, cigarettes, uncomplicated: Secondary | ICD-10-CM | POA: Insufficient documentation

## 2020-02-15 DIAGNOSIS — Z86011 Personal history of benign neoplasm of the brain: Secondary | ICD-10-CM | POA: Diagnosis not present

## 2020-02-15 DIAGNOSIS — Z21 Asymptomatic human immunodeficiency virus [HIV] infection status: Secondary | ICD-10-CM | POA: Diagnosis not present

## 2020-02-15 DIAGNOSIS — E669 Obesity, unspecified: Secondary | ICD-10-CM | POA: Diagnosis not present

## 2020-02-15 DIAGNOSIS — S0993XA Unspecified injury of face, initial encounter: Secondary | ICD-10-CM | POA: Diagnosis present

## 2020-02-15 MED ORDER — TRAMADOL HCL 50 MG PO TABS
50.0000 mg | ORAL_TABLET | Freq: Once | ORAL | Status: AC
  Administered 2020-02-15: 50 mg via ORAL
  Filled 2020-02-15: qty 1
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
## 2020-02-15 NOTE — Discharge Instructions (Addendum)
Ice to area of pain.  Return if any problems.  

## 2020-02-18 ENCOUNTER — Other Ambulatory Visit: Payer: Self-pay | Admitting: Endocrinology

## 2020-02-18 ENCOUNTER — Other Ambulatory Visit: Payer: Self-pay | Admitting: Family Medicine

## 2020-02-18 DIAGNOSIS — IMO0002 Reserved for concepts with insufficient information to code with codable children: Secondary | ICD-10-CM

## 2020-02-18 DIAGNOSIS — J4520 Mild intermittent asthma, uncomplicated: Secondary | ICD-10-CM

## 2020-02-18 DIAGNOSIS — E1165 Type 2 diabetes mellitus with hyperglycemia: Secondary | ICD-10-CM

## 2020-02-18 MED FILL — PROAIR HFA 90 MCG INHALER: 108 (90 BAS | 17 days supply | Qty: 9 | Fill #0

## 2020-02-18 MED FILL — ADVAIR 100/50 DISKUS: 100-50 | 30 days supply | Qty: 60 | Fill #0

## 2020-02-18 NOTE — Telephone Encounter (Signed)
Requested Prescriptions  Pending Prescriptions Disp Refills  . SYMBICORT 80-4.5 MCG/ACT inhaler [Pharmacy Med Name: [place] 80/4.5MCG (120  ORAL INH)] 10.2 g 3    Sig: INHALE 2 PUFFS INTO THE LUNGS TWICE DAILY     Pulmonology:  Combination Products Passed - 02/18/2020 10:10 AM      Passed - Valid encounter within last 12 months    Recent Outpatient Visits          5 months ago Chronic migraine without aura without status migrainosus, not intractable   [place] Fulp, [place], MD   8 months ago Memory loss due to medical condition   [place] Fulp, [place], MD   10 months ago Major depression, recurrent, chronic ([place])   [place] Fulp, [place], MD   11 months ago Midepigastric pain   [place], [place]   1 year ago Mild intermittent asthma with exacerbation   [facility] [place], [place], MD

## 2020-02-18 NOTE — Telephone Encounter (Signed)
Requested medication (s) are due for refill today: yes  Requested medication (s) are on the active medication list: yes  Last refill:  10/08/19  Future visit scheduled: no  Notes to clinic:  historical provider    Requested Prescriptions  Pending Prescriptions Disp Refills   [place] 18 MG/3ML SOPN [Pharmacy Med Name: VICTOZA 2-PAK 18 MG/3 ML PEN]  1    Sig: INJECT 1.2 MG SUB-Q ONCE DAILY      Endocrinology:  Diabetes - GLP-1 Receptor Agonists Failed - 02/18/2020  9:37 AM      Failed - HBA1C is between 0 and 7.9 and within 180 days    HbA1c, POC (controlled diabetic range)  Date Value Ref Range Status  03/18/2019 5.9 0.0 - 7.0 % Final   Hgb A1c MFr Bld  Date Value Ref Range Status  06/23/2019 5.6 4.8 - 5.6 % Final    Comment:             Prediabetes: 5.7 - 6.4          Diabetes: >6.4          Glycemic control for adults with diabetes: <7.0           Passed - Valid encounter within last 6 months    Recent Outpatient Visits           5 months ago Chronic migraine without aura without status migrainosus, not intractable   [place] Fulp, [place], MD   8 months ago Memory loss due to medical condition   [place], MD   10 months ago Major depression, recurrent, chronic ([place])   [place] Fulp, [place], MD   11 months ago Midepigastric pain   [place], [place]   1 year ago Mild intermittent asthma with exacerbation   [facility] [place], [place], MD

## 2020-02-24 ENCOUNTER — Other Ambulatory Visit (HOSPITAL_COMMUNITY): Payer: Self-pay | Admitting: Family Medicine

## 2020-03-15 ENCOUNTER — Emergency Department (HOSPITAL_COMMUNITY)
Admission: EM | Admit: 2020-03-15 | Discharge: 2020-03-15 | Discharge status: Absent without leave | Payer: Medicaid Other

## 2020-03-15 ENCOUNTER — Other Ambulatory Visit: Payer: Self-pay

## 2020-03-16 ENCOUNTER — Other Ambulatory Visit: Payer: Self-pay

## 2020-03-16 ENCOUNTER — Encounter (HOSPITAL_COMMUNITY): Payer: Self-pay

## 2020-03-16 ENCOUNTER — Ambulatory Visit (HOSPITAL_COMMUNITY)
Admission: EM | Admit: 2020-03-16 | Discharge: 2020-03-16 | Disposition: A | Discharge status: Home / Self Care | Payer: Medicaid Other | Attending: Family | Admitting: Family

## 2020-03-16 ENCOUNTER — Other Ambulatory Visit (HOSPITAL_COMMUNITY): Payer: Self-pay | Admitting: Family

## 2020-03-16 ENCOUNTER — Ambulatory Visit (INDEPENDENT_AMBULATORY_CARE_PROVIDER_SITE_OTHER): Payer: Medicaid Other

## 2020-03-16 DIAGNOSIS — F1190 Opioid use, unspecified, uncomplicated: Secondary | ICD-10-CM | POA: Insufficient documentation

## 2020-03-16 DIAGNOSIS — R059 Cough, unspecified: Secondary | ICD-10-CM | POA: Diagnosis not present

## 2020-03-16 DIAGNOSIS — R0789 Other chest pain: Secondary | ICD-10-CM | POA: Insufficient documentation

## 2020-03-16 DIAGNOSIS — Z20822 Contact with and (suspected) exposure to covid-19: Secondary | ICD-10-CM | POA: Diagnosis not present

## 2020-03-16 DIAGNOSIS — J0140 Acute pansinusitis, unspecified: Secondary | ICD-10-CM | POA: Diagnosis not present

## 2020-03-16 MED ORDER — AZITHROMYCIN 250 MG PO TABS: 250.0000 mg | ORAL_TABLET | Freq: Every day | ORAL | 0 refills | Status: DC

## 2020-03-16 MED ORDER — PROMETHAZINE-CODEINE 6.25-10 MG/5ML PO SOLN: 5.0000 mL | Freq: Four times a day (QID) | ORAL | 0 refills | Status: DC | PRN
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
## 2020-03-16 NOTE — Discharge Instructions (Addendum)
Recommend start Zithromax as directed. May continue Tessalon cough pills 1 every 8 hours as needed. May also take Promethazine-Codeine cough syrup- 1 teaspoon every 6 hours as needed- use mainly at night. Follow-up pending COVID 19 test results and in 3 to 4 days with your PCP if not improving.

## 2020-03-16 NOTE — ED Triage Notes (Signed)
Pt presents with non productive cough, chest congestion, nasal drainage, vomiting, diarrhea, watery & irritated eyes, and body aches X 1 week.

## 2020-03-17 LAB — NOVEL CORONAVIRUS, NAA (HOSP ORDER, SEND-OUT TO REF LAB; TAT 18-24 HRS): SARS-CoV-2, NAA: NOT DETECTED

## 2020-03-27 ENCOUNTER — Other Ambulatory Visit: Payer: Self-pay

## 2020-03-27 ENCOUNTER — Ambulatory Visit (HOSPITAL_COMMUNITY)
Admission: EM | Admit: 2020-03-27 | Discharge: 2020-03-27 | Discharge status: Against Medical Advice | Payer: Medicaid Other

## 2020-03-27 NOTE — ED Notes (Addendum)
Pt was called once on phone she stated she was coming into lobby. Pt called in lobby 5 minutes later with no response. Pt called in lobby again at 10 minutes, no response

## 2020-04-24 ENCOUNTER — Other Ambulatory Visit: Payer: Self-pay

## 2020-04-24 ENCOUNTER — Encounter (HOSPITAL_COMMUNITY): Payer: Self-pay

## 2020-04-24 ENCOUNTER — Emergency Department (HOSPITAL_COMMUNITY)
Admission: EM | Admit: 2020-04-24 | Discharge: 2020-04-24 | Disposition: A | Discharge status: Home / Self Care | Payer: Medicaid Other

## 2020-04-24 ENCOUNTER — Emergency Department (HOSPITAL_COMMUNITY)
Admission: EM | Admit: 2020-04-24 | Discharge: 2020-04-24 | Disposition: A | Discharge status: Home / Self Care | Payer: Medicaid Other | Attending: Emergency Medicine | Admitting: Emergency Medicine

## 2020-04-24 ENCOUNTER — Ambulatory Visit (HOSPITAL_COMMUNITY)
Admission: RE | Admit: 2020-04-24 | Discharge: 2020-04-24 | Disposition: A | Discharge status: Home / Self Care | Payer: Medicaid Other | Source: Ambulatory Visit | Attending: Student | Admitting: Student

## 2020-04-24 ENCOUNTER — Emergency Department (HOSPITAL_COMMUNITY)
Admission: EM | Admit: 2020-04-24 | Discharge: 2020-04-24 | Discharge status: Absent without leave | Payer: Medicaid Other

## 2020-04-24 VITALS — BP 131/83 | HR 112 | Temp 98.2°F | Resp 18

## 2020-04-24 DIAGNOSIS — R1013 Epigastric pain: Secondary | ICD-10-CM | POA: Diagnosis not present

## 2020-04-24 DIAGNOSIS — Z5321 Procedure and treatment not carried out due to patient leaving prior to being seen by health care provider: Secondary | ICD-10-CM | POA: Diagnosis not present

## 2020-04-24 DIAGNOSIS — R109 Unspecified abdominal pain: Secondary | ICD-10-CM | POA: Diagnosis not present

## 2020-04-24 DIAGNOSIS — M549 Dorsalgia, unspecified: Secondary | ICD-10-CM | POA: Diagnosis not present

## 2020-04-24 LAB — POCT URINALYSIS DIPSTICK, ED / UC
Glucose, UA: NEGATIVE mg/dL
Leukocytes,Ua: NEGATIVE
Nitrite: NEGATIVE
Protein, ur: NEGATIVE mg/dL
Specific Gravity, Urine: >=1.03 (ref 1.005–1.030)
Urobilinogen, UA: 0.2 mg/dL (ref 0.0–1.0)
pH: 6 (ref 5.0–8.0)

## 2020-04-24 LAB — POC URINE PREG, ED: Preg Test, Ur: NEGATIVE
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
## 2020-04-24 NOTE — ED Notes (Addendum)
Before triaging pt, made her aware that there was a long wait to be seen. Pt immediately began cussing at this RN and reported that we needed to make some rooms for her since it was an emergency room. Pt began ripping off her blood pressure cuff and exited the room while screaming and cussing at this RN. Informed pt that we would have to get security to escort her off of the property, to which she stated "you better make sure your kids get escorted home safely." Off duty immediately notified of this threat and security at bedside.

## 2020-04-24 NOTE — ED Triage Notes (Signed)
Pt presents with abdominal pain and lower back pain x 3 weeks. Pt states the pain started around the belly button radiates to lower back pain. States she had this before and was told in June 2021can be Crohn's disease, appendicitis or blockage in the intestine.   Pt reports having bowel everyday, but the stools are soft but she needs time.\   States feeling a blockage around the belly button, and when push the belly button pain radiates to the back.   States feeling something hard around the anus when touching  the buttocks.   Denies diarrhea, fever, nausea, vomiting, chest pain, dizziness.

## 2020-04-24 NOTE — Discharge Instructions (Addendum)
Head straight to [facility] ED 

## 2020-04-24 NOTE — ED Triage Notes (Signed)
Pt to er, pt states that she has had back pain and abd pain for the past three weeks, states that she has passed out twice because of the pain, states that she was in [place] and went to urgent care and they sent her to the er, states that she went to [facility] and [facility] and was told the wait was very long so she came here.  States that it is a constant pain, states that eating makes the pain worse. States that her pain is above her belly button.  Denies diarrhea reports vomiting.

## 2020-04-24 NOTE — ED Notes (Signed)
Patient is being discharged from the Urgent Care and sent to the Emergency Department via pov . Per Marin Roberts, PA patient is in need of higher level of care due to severe abdominal and back pain. Patient is aware and verbalizes understanding of plan of care.  Vitals:   04/24/20 1255  BP: 131/83  Pulse: (!) 112  Resp: 18  Temp: 98.2 F (36.8 C)

## 2020-04-25 LAB — URINE CULTURE: Culture: NO GROWTH

## 2020-04-26 ENCOUNTER — Other Ambulatory Visit: Payer: Self-pay

## 2020-04-26 ENCOUNTER — Emergency Department (HOSPITAL_COMMUNITY)
Admission: EM | Admit: 2020-04-26 | Discharge: 2020-04-26 | Disposition: A | Discharge status: Home / Self Care | Payer: Medicaid Other | Attending: Emergency Medicine | Admitting: Emergency Medicine

## 2020-04-26 DIAGNOSIS — M549 Dorsalgia, unspecified: Secondary | ICD-10-CM | POA: Insufficient documentation

## 2020-04-26 DIAGNOSIS — R112 Nausea with vomiting, unspecified: Secondary | ICD-10-CM | POA: Diagnosis not present

## 2020-04-26 DIAGNOSIS — R109 Unspecified abdominal pain: Secondary | ICD-10-CM | POA: Diagnosis not present

## 2020-04-26 DIAGNOSIS — Z5321 Procedure and treatment not carried out due to patient leaving prior to being seen by health care provider: Secondary | ICD-10-CM | POA: Insufficient documentation

## 2020-04-26 LAB — URINALYSIS, ROUTINE W REFLEX MICROSCOPIC
Bilirubin Urine: NEGATIVE
Glucose, UA: NEGATIVE mg/dL
Ketones, ur: NEGATIVE mg/dL
Leukocytes,Ua: NEGATIVE
Nitrite: NEGATIVE
Protein, ur: NEGATIVE mg/dL
Specific Gravity, Urine: 1.025 (ref 1.005–1.030)
pH: 6 (ref 5.0–8.0)

## 2020-04-26 LAB — COMPREHENSIVE METABOLIC PANEL
ALT: 15 U/L (ref 0–44)
AST: 19 U/L (ref 15–41)
Albumin: 3.9 g/dL (ref 3.5–5.0)
Alkaline Phosphatase: 57 U/L (ref 38–126)
Anion gap: 10 (ref 5–15)
BUN: 11 mg/dL (ref 6–20)
CO2: 24 mmol/L (ref 22–32)
Calcium: 9.1 mg/dL (ref 8.9–10.3)
Chloride: 104 mmol/L (ref 98–111)
Creatinine, Ser: 0.97 mg/dL (ref 0.44–1.00)
GFR, Estimated: >60 mL/min (ref >60)
Glucose, Bld: 96 mg/dL (ref 70–99)
Potassium: 4.1 mmol/L (ref 3.5–5.1)
Sodium: 138 mmol/L (ref 135–145)
Total Bilirubin: 0.7 mg/dL (ref 0.3–1.2)
Total Protein: 7.2 g/dL (ref 6.5–8.1)

## 2020-04-26 LAB — I-STAT BETA HCG BLOOD, ED (MC, WL, AP ONLY): I-stat hCG, quantitative: <5 m[IU]/mL (ref <5)

## 2020-04-26 LAB — LIPASE, BLOOD: Lipase: 33 U/L (ref 11–51)

## 2020-04-26 LAB — CBC
HCT: 42.9 % (ref 36.0–46.0)
Hemoglobin: 13.9 g/dL (ref 12.0–15.0)
MCH: 30.5 pg (ref 26.0–34.0)
MCHC: 32.4 g/dL (ref 30.0–36.0)
MCV: 94.1 fL (ref 80.0–100.0)
Platelets: 239 10*3/uL (ref 150–400)
RBC: 4.56 MIL/uL (ref 3.87–5.11)
RDW: 13.9 % (ref 11.5–15.5)
WBC: 6.1 10*3/uL (ref 4.0–10.5)
nRBC: 0 % (ref 0.0–0.2)

## 2020-04-26 NOTE — ED Notes (Signed)
Pt waiting in car. Pt being very combative over why she didn't get any xray. Per Judson Roch and I explained we do not just order xray.

## 2020-04-26 NOTE — ED Notes (Addendum)
Trying to take pt vitals, and pt continues to turn phone towards this staff member. This staff asked pt if she was recording and if so to please stop. Pt got upset and started cussing at staff and said she was not recording. Security and [employer] notified.

## 2020-04-26 NOTE — ED Triage Notes (Signed)
Pt presents for ongoing back and abdominal pain x 3 weeks. Endorses n/v. Seen for same at [facility] and advised to come to ED for eval of possible pancreatitis or appendicitis.

## 2020-04-26 NOTE — ED Notes (Signed)
Pt left.,said she field a complaint. Also said she had plenty of pictures of workers and names, that we should see them on news, Facebook and reviews.

## 2020-04-26 NOTE — ED Notes (Signed)
Pt refusing vitals.

## 2020-05-04 ENCOUNTER — Ambulatory Visit (HOSPITAL_COMMUNITY)
Admission: EM | Admit: 2020-05-04 | Discharge: 2020-05-04 | Discharge status: Against Medical Advice | Payer: Medicaid Other

## 2020-05-04 ENCOUNTER — Other Ambulatory Visit: Payer: Self-pay

## 2020-05-04 NOTE — ED Notes (Signed)
Called patient twice on phone and once in lobby with no answer

## 2020-05-11 ENCOUNTER — Other Ambulatory Visit: Payer: Self-pay | Admitting: Family Medicine

## 2020-05-11 DIAGNOSIS — Z1231 Encounter for screening mammogram for malignant neoplasm of breast: Secondary | ICD-10-CM

## 2020-05-14 ENCOUNTER — Ambulatory Visit (HOSPITAL_COMMUNITY): Payer: Self-pay

## 2020-05-26 ENCOUNTER — Other Ambulatory Visit: Payer: Self-pay

## 2020-05-26 ENCOUNTER — Ambulatory Visit (HOSPITAL_COMMUNITY)
Admission: RE | Admit: 2020-05-26 | Discharge: 2020-05-26 | Disposition: A | Discharge status: Home / Self Care | Payer: Medicaid Other | Source: Ambulatory Visit | Attending: Student | Admitting: Student

## 2020-05-26 ENCOUNTER — Encounter (HOSPITAL_COMMUNITY): Payer: Self-pay

## 2020-05-26 VITALS — BP 136/94 | HR 85 | Temp 98.5°F | Resp 18

## 2020-05-26 DIAGNOSIS — Z760 Encounter for issue of repeat prescription: Secondary | ICD-10-CM | POA: Diagnosis not present

## 2020-05-26 DIAGNOSIS — E119 Type 2 diabetes mellitus without complications: Secondary | ICD-10-CM

## 2020-05-26 DIAGNOSIS — Z794 Long term (current) use of insulin: Secondary | ICD-10-CM

## 2020-05-26 DIAGNOSIS — J3089 Other allergic rhinitis: Secondary | ICD-10-CM

## 2020-05-26 MED ORDER — PROMETHAZINE-DM 6.25-15 MG/5ML PO SYRP: 5.0000 mL | ORAL_SOLUTION | Freq: Four times a day (QID) | ORAL | 0 refills | Status: DC | PRN

## 2020-05-26 MED ORDER — INSULIN GLARGINE 100 UNITS/ML SOLOSTAR PEN: 10.0000 [IU] | PEN_INJECTOR | Freq: Every day | SUBCUTANEOUS | 0 refills | Status: DC

## 2020-05-26 MED ORDER — TRELEGY ELLIPTA 200-62.5-25 MCG/INH IN AEPB: 2.0000 | INHALATION_SPRAY | Freq: Three times a day (TID) | RESPIRATORY_TRACT | 0 refills | Status: DC
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
## 2020-05-26 NOTE — Discharge Instructions (Addendum)
-  I refilled your lantus pens today. Use 10 units daily, as you have been doing in the past.  -Restart your trelegy ellipta inhaler.  -I sent another prescription of the promethazine DM. Continue your claritin. -Come back and see [place] if you develop new/worsening symptoms, including shortness of breath, chest pain, fevers/chills, etc.

## 2020-05-26 NOTE — ED Triage Notes (Signed)
Pt states that her BS have been fluacting lately and last night it was 198 and this morning it was 119. Pt states that she ran out of her insulin and inhaler and is not able to get an appt with provider until the middle of next month. Pt states that she needs a refill on her Lantus Solar pen and inhaler

## 2020-05-27 ENCOUNTER — Telehealth (HOSPITAL_COMMUNITY): Payer: Self-pay

## 2020-05-27 NOTE — Telephone Encounter (Signed)
Pt came to [facility] stating that her pharmacy notified her that she requires prior authorization approval for them to dispense Lantus and Elepta Rx that were sent on 05/26/20. Spoke with Olene Craven to notify her of same and was instructed to advise pt to contact her pharmacy and ask if there is a suitable substitute for both Rx that does not require prior authorization. If pharmacy determines there is a suitable substitute, they are to call [facility] and request Rx be sent. If suitable substitute is not available, then pt will need to contact her PMD on Monday to arrange for prior authorization and dispense of Rx. Pt verbalized understanding of all.

## 2020-06-02 ENCOUNTER — Inpatient Hospital Stay: Admission: RE | Admit: 2020-06-02 | Payer: Medicaid Other | Source: Ambulatory Visit

## 2020-06-02 DIAGNOSIS — Z1231 Encounter for screening mammogram for malignant neoplasm of breast: Secondary | ICD-10-CM

## 2020-06-18 ENCOUNTER — Ambulatory Visit (HOSPITAL_COMMUNITY)
Admission: EM | Admit: 2020-06-18 | Discharge: 2020-06-18 | Disposition: A | Discharge status: Home / Self Care | Payer: Medicaid Other | Attending: Medical Oncology | Admitting: Medical Oncology

## 2020-06-18 ENCOUNTER — Encounter (HOSPITAL_COMMUNITY): Payer: Self-pay | Admitting: Emergency Medicine

## 2020-06-18 DIAGNOSIS — H66003 Acute suppurative otitis media without spontaneous rupture of ear drum, bilateral: Secondary | ICD-10-CM

## 2020-06-18 MED ORDER — AZITHROMYCIN 250 MG PO TABS: ORAL_TABLET | ORAL | 0 refills | Status: DC
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
## 2020-06-18 NOTE — ED Triage Notes (Signed)
Pt states that she has some ear fullness in both ears but mainly in he left ear. Pt states that she has slight pain left ear. Pt states that her sx started two days ago.

## 2020-08-14 ENCOUNTER — Other Ambulatory Visit (HOSPITAL_COMMUNITY): Payer: Self-pay

## 2020-08-14 MED FILL — Liraglutide Soln Pen-injector 18 MG/3ML (6 MG/ML): SUBCUTANEOUS | 30 days supply | Qty: 6 | Fill #0 | Status: CN

## 2020-08-14 MED FILL — Glucose Blood Test Strip: 90 days supply | Qty: 100 | Fill #0 | Status: AC

## 2020-08-15 ENCOUNTER — Other Ambulatory Visit (HOSPITAL_COMMUNITY): Payer: Self-pay

## 2020-08-16 ENCOUNTER — Other Ambulatory Visit (HOSPITAL_COMMUNITY): Payer: Self-pay

## 2020-08-16 MED FILL — Liraglutide Soln Pen-injector 18 MG/3ML (6 MG/ML): SUBCUTANEOUS | 30 days supply | Qty: 6 | Fill #0 | Status: AC

## 2020-08-30 ENCOUNTER — Other Ambulatory Visit (HOSPITAL_COMMUNITY): Payer: Self-pay

## 2020-08-30 MED ORDER — OMEPRAZOLE 40 MG PO CPDR
DELAYED_RELEASE_CAPSULE | ORAL | 3 refills | Status: DC
  Filled 2020-08-30: qty 90, 90d supply, fill #0

## 2020-08-30 MED ORDER — SUTAB 1479-225-188 MG PO TABS
ORAL_TABLET | ORAL | 0 refills | Status: DC
  Filled 2020-08-30 (×2): qty 24, 1d supply, fill #0
  Filled 2020-09-06: qty 24, 2d supply, fill #0

## 2020-08-31 ENCOUNTER — Ambulatory Visit: Payer: Medicaid Other | Admitting: Family

## 2020-08-31 ENCOUNTER — Other Ambulatory Visit (HOSPITAL_COMMUNITY): Payer: Self-pay

## 2020-08-31 MED ORDER — OXYCODONE HCL 10 MG PO TABS
ORAL_TABLET | ORAL | 0 refills | Status: DC
  Filled 2020-08-31: qty 120, 30d supply, fill #0

## 2020-09-06 ENCOUNTER — Other Ambulatory Visit (HOSPITAL_COMMUNITY): Payer: Self-pay

## 2020-09-15 ENCOUNTER — Other Ambulatory Visit (HOSPITAL_COMMUNITY): Payer: Self-pay

## 2020-09-15 ENCOUNTER — Other Ambulatory Visit: Payer: Self-pay | Admitting: Endocrinology

## 2020-09-15 MED FILL — Liraglutide Soln Pen-injector 18 MG/3ML (6 MG/ML): SUBCUTANEOUS | 30 days supply | Qty: 6 | Fill #1 | Status: AC

## 2020-09-18 ENCOUNTER — Other Ambulatory Visit (HOSPITAL_COMMUNITY): Payer: Self-pay

## 2020-09-19 ENCOUNTER — Ambulatory Visit: Payer: Medicaid Other

## 2020-09-20 ENCOUNTER — Other Ambulatory Visit (HOSPITAL_COMMUNITY): Payer: Self-pay

## 2020-09-21 ENCOUNTER — Encounter (HOSPITAL_COMMUNITY): Payer: Self-pay | Admitting: Emergency Medicine

## 2020-09-21 ENCOUNTER — Emergency Department (HOSPITAL_COMMUNITY): Payer: Medicaid Other

## 2020-09-21 ENCOUNTER — Emergency Department (HOSPITAL_COMMUNITY)
Admission: EM | Admit: 2020-09-21 | Discharge: 2020-09-21 | Discharge status: Against Medical Advice | Payer: Medicaid Other | Attending: Emergency Medicine | Admitting: Emergency Medicine

## 2020-09-21 ENCOUNTER — Other Ambulatory Visit: Payer: Self-pay

## 2020-09-21 DIAGNOSIS — M25511 Pain in right shoulder: Secondary | ICD-10-CM | POA: Diagnosis not present

## 2020-09-21 DIAGNOSIS — Z5321 Procedure and treatment not carried out due to patient leaving prior to being seen by health care provider: Secondary | ICD-10-CM | POA: Insufficient documentation

## 2020-09-21 DIAGNOSIS — Y92410 Unspecified street and highway as the place of occurrence of the external cause: Secondary | ICD-10-CM | POA: Insufficient documentation

## 2020-09-21 DIAGNOSIS — M542 Cervicalgia: Secondary | ICD-10-CM | POA: Diagnosis present

## 2020-09-21 DIAGNOSIS — M25561 Pain in right knee: Secondary | ICD-10-CM | POA: Insufficient documentation
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
## 2020-09-30 ENCOUNTER — Other Ambulatory Visit: Payer: Self-pay

## 2020-09-30 ENCOUNTER — Emergency Department (HOSPITAL_COMMUNITY)
Admission: EM | Admit: 2020-09-30 | Discharge: 2020-09-30 | Discharge status: Against Medical Advice | Payer: Medicaid Other | Attending: Emergency Medicine | Admitting: Emergency Medicine

## 2020-09-30 DIAGNOSIS — Z86011 Personal history of benign neoplasm of the brain: Secondary | ICD-10-CM | POA: Diagnosis not present

## 2020-09-30 DIAGNOSIS — Z7951 Long term (current) use of inhaled steroids: Secondary | ICD-10-CM | POA: Diagnosis not present

## 2020-09-30 DIAGNOSIS — Z91040 Latex allergy status: Secondary | ICD-10-CM | POA: Diagnosis not present

## 2020-09-30 DIAGNOSIS — I10 Essential (primary) hypertension: Secondary | ICD-10-CM | POA: Insufficient documentation

## 2020-09-30 DIAGNOSIS — E1169 Type 2 diabetes mellitus with other specified complication: Secondary | ICD-10-CM | POA: Diagnosis not present

## 2020-09-30 DIAGNOSIS — Z79899 Other long term (current) drug therapy: Secondary | ICD-10-CM | POA: Diagnosis not present

## 2020-09-30 DIAGNOSIS — E669 Obesity, unspecified: Secondary | ICD-10-CM | POA: Insufficient documentation

## 2020-09-30 DIAGNOSIS — J45909 Unspecified asthma, uncomplicated: Secondary | ICD-10-CM | POA: Insufficient documentation

## 2020-09-30 DIAGNOSIS — Z8541 Personal history of malignant neoplasm of cervix uteri: Secondary | ICD-10-CM | POA: Diagnosis not present

## 2020-09-30 DIAGNOSIS — R569 Unspecified convulsions: Secondary | ICD-10-CM | POA: Diagnosis present

## 2020-09-30 DIAGNOSIS — Z794 Long term (current) use of insulin: Secondary | ICD-10-CM | POA: Diagnosis not present

## 2020-09-30 DIAGNOSIS — F17210 Nicotine dependence, cigarettes, uncomplicated: Secondary | ICD-10-CM | POA: Insufficient documentation

## 2020-09-30 DIAGNOSIS — Z21 Asymptomatic human immunodeficiency virus [HIV] infection status: Secondary | ICD-10-CM | POA: Insufficient documentation

## 2020-09-30 DIAGNOSIS — Z7984 Long term (current) use of oral hypoglycemic drugs: Secondary | ICD-10-CM | POA: Diagnosis not present

## 2020-09-30 DIAGNOSIS — G40909 Epilepsy, unspecified, not intractable, without status epilepticus: Secondary | ICD-10-CM | POA: Insufficient documentation

## 2020-09-30 LAB — CBG MONITORING, ED: Glucose-Capillary: 122 mg/dL — ABNORMAL HIGH (ref 70–99)

## 2020-09-30 NOTE — ED Triage Notes (Signed)
Pt brought in by [facility] for single seizure episode lasting approx 1 to 2 minutes witness by aunt. Patient currently alert and able to answer questions. Patient reports hx of grand mal seizure in past. States seizure are usually triggered by stress. Patient reports hx of brain tumor.
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
## 2020-09-30 NOTE — ED Notes (Signed)
No meds given per ems
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
## 2020-10-02 ENCOUNTER — Other Ambulatory Visit (HOSPITAL_COMMUNITY): Payer: Self-pay

## 2020-10-02 MED ORDER — VITAMIN D (ERGOCALCIFEROL) 1.25 MG (50000 UNIT) PO CAPS
ORAL_CAPSULE | ORAL | 5 refills | Status: AC
  Filled 2020-10-02: qty 4, 28d supply, fill #0

## 2020-10-02 MED ORDER — ATORVASTATIN CALCIUM 20 MG PO TABS
1.0000 | ORAL_TABLET | Freq: Every day | ORAL | 1 refills | Status: DC
  Filled 2020-10-02: qty 90, 90d supply, fill #0

## 2020-10-02 MED ORDER — GLUCOSE BLOOD VI STRP
1.0000 | ORAL_STRIP | 12 refills | Status: DC
  Filled 2020-10-02: qty 100, 30d supply, fill #0
  Filled 2020-11-13: qty 100, 33d supply, fill #0
  Filled 2020-12-13: qty 100, 33d supply, fill #1

## 2020-10-02 MED ORDER — OXYCODONE HCL 10 MG PO TABS
ORAL_TABLET | ORAL | 0 refills | Status: DC
  Filled 2020-10-02: qty 120, 30d supply, fill #0

## 2020-10-17 ENCOUNTER — Other Ambulatory Visit (HOSPITAL_COMMUNITY): Payer: Self-pay

## 2020-10-17 MED ORDER — VICTOZA 18 MG/3ML ~~LOC~~ SOPN
PEN_INJECTOR | SUBCUTANEOUS | 1 refills | Status: DC
  Filled 2020-10-17 – 2020-11-13 (×2): qty 6, 30d supply, fill #0
  Filled 2020-12-13: qty 6, 30d supply, fill #1
  Filled 2021-01-12: qty 6, 30d supply, fill #2

## 2020-10-17 MED ORDER — BUDESONIDE-FORMOTEROL FUMARATE 160-4.5 MCG/ACT IN AERO
INHALATION_SPRAY | RESPIRATORY_TRACT | 3 refills | Status: DC
  Filled 2020-10-17 – 2020-11-13 (×3): qty 10.2, 30d supply, fill #0
  Filled 2020-12-13 – 2020-12-14 (×2): qty 10.2, 30d supply, fill #1

## 2020-10-31 ENCOUNTER — Other Ambulatory Visit (HOSPITAL_COMMUNITY): Payer: Self-pay

## 2020-10-31 MED ORDER — FAMOTIDINE 40 MG PO TABS
80.0000 mg | ORAL_TABLET | Freq: Three times a day (TID) | ORAL | 1 refills | Status: DC
  Filled 2020-10-31: qty 90, 15d supply, fill #0

## 2020-10-31 MED ORDER — OMEPRAZOLE 40 MG PO CPDR
40.0000 mg | DELAYED_RELEASE_CAPSULE | Freq: Every day | ORAL | 1 refills | Status: DC
  Filled 2020-10-31: qty 90, 90d supply, fill #0

## 2020-10-31 MED ORDER — OXYCODONE HCL 10 MG PO TABS
10.0000 mg | ORAL_TABLET | Freq: Four times a day (QID) | ORAL | 0 refills | Status: DC | PRN
  Filled 2020-10-31: qty 120, 30d supply, fill #0

## 2020-11-02 ENCOUNTER — Other Ambulatory Visit (HOSPITAL_COMMUNITY): Payer: Self-pay

## 2020-11-03 ENCOUNTER — Other Ambulatory Visit (HOSPITAL_COMMUNITY): Payer: Self-pay

## 2020-11-03 MED ORDER — FAMOTIDINE 40 MG PO TABS
40.0000 mg | ORAL_TABLET | Freq: Every day | ORAL | 1 refills | Status: DC
  Filled 2020-11-03: qty 90, 90d supply, fill #0

## 2020-11-06 ENCOUNTER — Other Ambulatory Visit (HOSPITAL_COMMUNITY): Payer: Self-pay

## 2020-11-06 MED ORDER — FAMOTIDINE 40 MG PO TABS: 40.0000 mg | ORAL_TABLET | Freq: Every day | ORAL | 1 refills | Status: DC

## 2020-11-06 MED ORDER — OMEPRAZOLE 40 MG PO CPDR
40.0000 mg | DELAYED_RELEASE_CAPSULE | Freq: Every day | ORAL | 1 refills | Status: DC
  Filled 2020-11-06 – 2020-11-13 (×4): qty 90, 90d supply, fill #0

## 2020-11-13 ENCOUNTER — Other Ambulatory Visit (HOSPITAL_COMMUNITY): Payer: Self-pay

## 2020-11-13 ENCOUNTER — Other Ambulatory Visit: Payer: Self-pay | Admitting: Family Medicine

## 2020-11-13 NOTE — Telephone Encounter (Signed)
Requested medication (s) are due for refill today: Yes  Requested medication (s) are on the active medication list: Yes  Last refill:  06/23/19  Future visit scheduled: No  Notes to clinic:  Prescription expired.    Requested Prescriptions  Pending Prescriptions Disp Refills   PROAIR HFA 108 (90 Base) MCG/ACT inhaler 8.5 g 1    Sig: INHALE 2 PUFFS EVERY 4 HOURS AS NEEDED FOR WHEEZING OR SHORTNESS OF BREATH      Pulmonology:  Beta Agonists Failed - 11/13/2020 12:38 PM      Failed - One inhaler should last at least one month. If the patient is requesting refills earlier, contact the patient to check for uncontrolled symptoms.      Failed - Valid encounter within last 12 months    Recent Outpatient Visits           1 year ago Chronic migraine without aura without status migrainosus, not intractable   [place] Fulp, [place], MD   1 year ago Memory loss due to medical condition   [place] Fulp, [place], MD   1 year ago Major depression, recurrent, chronic ([place])   [place] Fulp, [place], MD   1 year ago Midepigastric pain   [place], [place]   1 year ago Mild intermittent asthma with exacerbation   [facility] Antony Blackbird, MD

## 2020-11-14 ENCOUNTER — Other Ambulatory Visit: Payer: Self-pay | Admitting: Family Medicine

## 2020-11-14 ENCOUNTER — Other Ambulatory Visit (HOSPITAL_COMMUNITY): Payer: Self-pay

## 2020-11-29 ENCOUNTER — Other Ambulatory Visit (HOSPITAL_COMMUNITY): Payer: Self-pay

## 2020-11-29 MED ORDER — OXYCODONE HCL 10 MG PO TABS
10.0000 mg | ORAL_TABLET | Freq: Four times a day (QID) | ORAL | 0 refills | Status: DC | PRN
  Filled 2020-11-29 – 2020-12-01 (×3): qty 120, 30d supply, fill #0

## 2020-11-30 ENCOUNTER — Other Ambulatory Visit (HOSPITAL_COMMUNITY): Payer: Self-pay

## 2020-12-01 ENCOUNTER — Other Ambulatory Visit (HOSPITAL_COMMUNITY): Payer: Self-pay

## 2020-12-13 ENCOUNTER — Other Ambulatory Visit (HOSPITAL_COMMUNITY): Payer: Self-pay

## 2020-12-14 ENCOUNTER — Other Ambulatory Visit (HOSPITAL_COMMUNITY): Payer: Self-pay

## 2020-12-18 ENCOUNTER — Ambulatory Visit (INDEPENDENT_AMBULATORY_CARE_PROVIDER_SITE_OTHER): Payer: Medicaid Other

## 2020-12-18 ENCOUNTER — Other Ambulatory Visit: Payer: Self-pay

## 2020-12-18 ENCOUNTER — Ambulatory Visit (HOSPITAL_COMMUNITY)
Admission: EM | Admit: 2020-12-18 | Discharge: 2020-12-18 | Disposition: A | Discharge status: Home / Self Care | Payer: Medicaid Other | Attending: Internal Medicine | Admitting: Internal Medicine

## 2020-12-18 DIAGNOSIS — M79674 Pain in right toe(s): Secondary | ICD-10-CM | POA: Diagnosis not present

## 2020-12-18 NOTE — Progress Notes (Addendum)
Orthopedic Tech Progress Note Patient Details:  Lori Ford 01-05-1976 [id number]  Ortho Devices Type of Ortho Device: ASO Ortho Device/Splint Location: R ankle Ortho Device/Splint Interventions: Application   Post Interventions Patient Tolerated: Well Instructions Provided: Adjustment of device, Care of device   Applied ASO per practitioners verbal request.  Cast shoe ordered, but ASO verbally requested. Pt left before I could clarify if this was the correct patient who needed the ASO or not.  Thanks,  Verdene Lennert, PT, DPT  Acute Rehabilitation Ortho Tech Supervisor [phone] pager #[phone] office    Lori Ford 12/18/2020, 8:12 PM

## 2020-12-18 NOTE — ED Triage Notes (Signed)
Pt has not arrived to waiting room.
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
## 2020-12-18 NOTE — ED Triage Notes (Signed)
Pt reports she hit the Rt great toe on chair. Pt now has pain and swelling to toe.

## 2020-12-18 NOTE — ED Triage Notes (Signed)
Pt called on mobile . Pt reported she would be right in.

## 2020-12-25 ENCOUNTER — Ambulatory Visit (HOSPITAL_COMMUNITY)
Admission: EM | Admit: 2020-12-25 | Discharge: 2020-12-25 | Disposition: A | Discharge status: Home / Self Care | Payer: Medicaid Other | Attending: Family Medicine | Admitting: Family Medicine

## 2020-12-25 ENCOUNTER — Other Ambulatory Visit (HOSPITAL_COMMUNITY): Payer: Self-pay

## 2020-12-25 ENCOUNTER — Encounter (HOSPITAL_COMMUNITY): Payer: Self-pay

## 2020-12-25 ENCOUNTER — Other Ambulatory Visit: Payer: Self-pay

## 2020-12-25 DIAGNOSIS — J4541 Moderate persistent asthma with (acute) exacerbation: Secondary | ICD-10-CM

## 2020-12-25 MED ORDER — TRELEGY ELLIPTA 200-62.5-25 MCG/INH IN AEPB
2.0000 | INHALATION_SPRAY | Freq: Three times a day (TID) | RESPIRATORY_TRACT | 1 refills | Status: DC
  Filled 2020-12-25: qty 60, 30d supply, fill #0

## 2020-12-25 MED ORDER — BUDESONIDE-FORMOTEROL FUMARATE 160-4.5 MCG/ACT IN AERO
INHALATION_SPRAY | RESPIRATORY_TRACT | 1 refills | Status: DC
  Filled 2020-12-25 – 2021-01-12 (×2): qty 10.2, 30d supply, fill #0

## 2020-12-25 MED ORDER — PROMETHAZINE-DM 6.25-15 MG/5ML PO SYRP
5.0000 mL | ORAL_SOLUTION | Freq: Four times a day (QID) | ORAL | 0 refills | Status: DC | PRN
  Filled 2020-12-25: qty 118, 6d supply, fill #0

## 2020-12-25 MED ORDER — PREDNISONE 20 MG PO TABS
40.0000 mg | ORAL_TABLET | Freq: Every day | ORAL | 0 refills | Status: DC
  Filled 2020-12-25: qty 10, 5d supply, fill #0

## 2020-12-25 NOTE — ED Triage Notes (Signed)
Pt presents with wheezing and productive cough x3d. Pt endorses using nebulizer and inhalers.

## 2020-12-26 ENCOUNTER — Encounter: Payer: Self-pay | Admitting: Family

## 2020-12-26 ENCOUNTER — Ambulatory Visit (INDEPENDENT_AMBULATORY_CARE_PROVIDER_SITE_OTHER): Payer: Medicaid Other | Admitting: Family

## 2020-12-26 ENCOUNTER — Other Ambulatory Visit: Payer: Self-pay

## 2020-12-26 ENCOUNTER — Other Ambulatory Visit (HOSPITAL_COMMUNITY): Payer: Self-pay

## 2020-12-26 VITALS — BP 142/92 | HR 70 | Temp 98.1°F | Ht 64.0 in | Wt 220.0 lb

## 2020-12-26 DIAGNOSIS — R569 Unspecified convulsions: Secondary | ICD-10-CM | POA: Diagnosis not present

## 2020-12-26 DIAGNOSIS — F339 Major depressive disorder, recurrent, unspecified: Secondary | ICD-10-CM | POA: Diagnosis not present

## 2020-12-26 DIAGNOSIS — Z9114 Patient's other noncompliance with medication regimen: Secondary | ICD-10-CM

## 2020-12-26 DIAGNOSIS — F419 Anxiety disorder, unspecified: Secondary | ICD-10-CM | POA: Diagnosis not present

## 2020-12-26 DIAGNOSIS — Z0000 Encounter for general adult medical examination without abnormal findings: Secondary | ICD-10-CM

## 2020-12-26 DIAGNOSIS — B20 Human immunodeficiency virus [HIV] disease: Secondary | ICD-10-CM

## 2020-12-26 DIAGNOSIS — Z23 Encounter for immunization: Secondary | ICD-10-CM

## 2020-12-26 MED ORDER — BIKTARVY 50-200-25 MG PO TABS
1.0000 | ORAL_TABLET | Freq: Every day | ORAL | 4 refills | Status: DC
Start: 2020-12-26 — End: 2021-03-19
  Filled 2020-12-26 – 2020-12-28 (×2): qty 30, 30d supply, fill #0
  Filled 2021-01-19: qty 30, 30d supply, fill #1
  Filled 2021-02-15: qty 30, 30d supply, fill #2

## 2020-12-26 NOTE — Patient Instructions (Addendum)
Nice to see you.  We will check your lab work today.  Start taking your medication daily as prescribed.   Do not take Dilantin with your medication.   Plan for follow up in 1 month or sooner if needed.  You've got this!  Have a great day and stay safe!
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
## 2020-12-27 ENCOUNTER — Other Ambulatory Visit: Payer: Medicaid Other

## 2020-12-27 NOTE — Assessment & Plan Note (Addendum)
   Discussed importance of safe sexual practice and condom usage. Condoms offered and declined.   Pneumovax updated today.

## 2020-12-27 NOTE — Assessment & Plan Note (Signed)
Lori Ford has poorly controlled virus secondary to not taking medication. Fortunately she has no signs/symptoms of opportunistic infection. We discussed the continued risks of progression and complication without medication. She is interested in starting medication. She has stopped taking Dilantin and plans to stay off of it. Will send in prescription for Biktarvy. Check lab work today. Encouraged to work with counseling as mental health will play a big role in her ability to remain in care. Plan for follow up in 1 month or sooner if needed.

## 2020-12-27 NOTE — Assessment & Plan Note (Signed)
Lori Ford has increased levels of depression and anxiety and possibility of PTSD given her history and would benefit from additional counseling and possibly psychiatric evaluation. No suicidal ideations or signs of psychosis. Will continue to monitor.

## 2020-12-27 NOTE — ED Provider Notes (Signed)
[facility]   [id number] 12/25/20 Arrival Time: 1226  ASSESSMENT & PLAN:  1. Moderate persistent asthma with exacerbation    No indication for chest imaging..  Meds ordered this encounter  Medications   budesonide-formoterol (SYMBICORT) 160-4.5 MCG/ACT inhaler    Sig: Inhale 2 (two) Puffs into the lungs two times daily    Dispense:  10.2 g    Refill:  1   Fluticasone-Umeclidin-Vilant (TRELEGY ELLIPTA) 200-62.5-25 MCG/INH AEPB    Sig: Inhale 2 puffs into the lungs in the morning, at noon, and at bedtime.    Dispense:  60 each    Refill:  1   promethazine-dextromethorphan (PROMETHAZINE-DM) 6.25-15 MG/5ML syrup    Sig: Take 5 mLs by mouth 4 (four) times daily as needed for cough.    Dispense:  118 mL    Refill:  0   predniSONE (DELTASONE) 20 MG tablet    Sig: Take 2 tablets (40 mg total) by mouth daily.    Dispense:  10 tablet    Refill:  0   She is comfortable with resuming meds and with home observation. Asthma precautions given. OTC symptom care as needed.  Recommend:  Follow-up Information     [facility].   Specialty: Urgent Care Why: If worsening or failing to improve as anticipated. Contact information: [place] [phone]                Reviewed expectations re: course of current medical issues. Questions answered. Outlined signs and symptoms indicating need for more acute intervention. Patient verbalized understanding. After Visit Summary given.  SUBJECTIVE: History from: patient. Lori Ford is a 45 y.o. female who presents with complaint of fairly persistent chest tightness and wheezing. Onset abrupt,  3d ago . Triggers: none identified. Describes wheezing as moderate when present. Fever: no. Overall normal PO intake without n/v. Sick contacts: no. Ambulatory without difficulty. No LE edema. Typically her asthma is well controlled. Inhaler use: out of inhaler. OTC  treatment: none.  Social History   Tobacco Use  Smoking Status Every Day   Packs/day: 0.25   Years: 22.00   Pack years: 5.50   Types: Cigarettes  Smokeless Tobacco Never  Tobacco Comments   07-29-18: Pt indicates she is down to 1 a day    OBJECTIVE:  Vitals:   12/25/20 1322 12/25/20 1324  BP: 138/85   Pulse: 64   Resp: 14   Temp: 98.6 F (37 C)   TempSrc: Oral   SpO2: 95%   Weight:  93.4 kg  Height:  '5\' 4"'$  (1.626 m)    General appearance: alert; NAD HEENT: [place]; AT; with mild nasal congestion Neck: supple without LAD Cv: RRR without murmer Lungs: unlabored respirations, moderate bilateral inspiratory and expiratory wheezing; cough: absent; no significant respiratory distress Skin: warm and dry Psychological: alert and cooperative; normal mood and affect  Allergies  Allergen Reactions   Bee Venom Swelling and Anaphylaxis   Doxycycline Hyclate Shortness Of Breath   Ibuprofen Shortness Of Breath    wheezing   Penicillins Anaphylaxis   Tussionex Pennkinetic Er [Hydrocod Polst-Cpm Polst Er] Other (See Comments)    Tongue swelling and bumps   Tylenol [Acetaminophen] Anaphylaxis and Shortness Of Breath   Zofran [Ondansetron] Nausea And Vomiting    Violent and uncontrolled   Clindamycin/Lincomycin Hives   Coconut Flavor Hives   Flagyl [Metronidazole] Other (See Comments)    "it gave me a real bad bacterial infection"  Flavoring Agent Nausea And Vomiting   Other Swelling    "GRAPE SUBSTANCE"   Proanthocyanidin Itching   Aspirin Palpitations    wheezing   Eggs Or Egg-Derived Products Hives   Latex Rash    Rash, wheezing    Past Medical History:  Diagnosis Date   Allergic rhinitis    Arthritis    Asthma    as child   Brain tumor (benign) ([place])    Cervical cancer ([place]) 2006   Chronic back pain    Cigarette nicotine dependence with withdrawal    Diabetes mellitus without complication (HCC)    DVT (deep venous thrombosis) (HCC)    Fibromyalgia     Gallstones    s/p cholecystectomy   GERD (gastroesophageal reflux disease)    HIV (human immunodeficiency virus infection) (HCC)    HTN (hypertension)    Meningioma (HCC)    Migraine    Sciatic pain    Seizure disorder, grand mal ([place])    dx 2005   Seizures ([place])    due to head trauma as adult   Family History  Problem Relation Age of Onset   Other Mother        varicose vein   Asthma Mother    High blood pressure Mother    Diabetes Mother    Colon cancer Father 26   Diabetes Father    Cancer Other    Diabetes Other    High blood pressure Other    Asthma Other    Thyroid disease Other    Breast cancer Maternal Aunt    Breast cancer Paternal Grandmother    Deep vein thrombosis Neg Hx    Pulmonary embolism Neg Hx    Social History   Socioeconomic History   Marital status: Significant Other    Spouse name: Not on file   Number of children: 1   Years of education: college   Highest education level: Not on file  Occupational History   Occupation: Disabled  Tobacco Use   Smoking status: Every Day    Packs/day: 0.25    Years: 22.00    Pack years: 5.50    Types: Cigarettes   Smokeless tobacco: Never   Tobacco comments:    07-29-18: Pt indicates she is down to 1 a day  Vaping Use   Vaping Use: Never used  Substance and Sexual Activity   Alcohol use: Not Currently   Drug use: Not Currently   Sexual activity: Yes    Partners: Male    Comment: declined condoms 12/2020  Other Topics Concern   Not on file  Social History Narrative   Lives with fiance.  Does not have a PCP.  [address] Jimmye Norman, walk in.  Has insurance.  [facility] [address].     Social Determinants of Health   Financial Resource Strain: Not on file  Food Insecurity: Not on file  Transportation Needs: Not on file  Physical Activity: Not on file  Stress: Not on file  Social Connections: Not on file  Intimate Partner Violence: Not on file             Vanessa Kick,  MD 12/27/20 [phone]

## 2020-12-27 NOTE — Assessment & Plan Note (Signed)
Lori Ford has been without seizure for about 1 month. Has appointment scheduled with new neurologist. Will stay off Dilantin as there are interactions with Biktarvy. If necessary we can change her HIV regimen if Dilantin is needed.

## 2020-12-28 ENCOUNTER — Other Ambulatory Visit (HOSPITAL_COMMUNITY): Payer: Self-pay

## 2020-12-28 ENCOUNTER — Other Ambulatory Visit: Payer: Medicaid Other

## 2021-01-01 ENCOUNTER — Other Ambulatory Visit: Payer: Medicaid Other

## 2021-01-01 ENCOUNTER — Other Ambulatory Visit (HOSPITAL_COMMUNITY): Payer: Self-pay

## 2021-01-02 ENCOUNTER — Other Ambulatory Visit (HOSPITAL_COMMUNITY): Payer: Self-pay

## 2021-01-02 ENCOUNTER — Telehealth: Payer: Self-pay

## 2021-01-02 NOTE — Telephone Encounter (Signed)
[name]  Patient's medications have been couriered to [facility] from [place] and will be picked up 01/03/21.  Ileene Patrick , [facility] Patient [facility] Phone: [phone] Fax:  [phone]

## 2021-01-03 ENCOUNTER — Other Ambulatory Visit: Payer: Medicaid Other

## 2021-01-03 ENCOUNTER — Other Ambulatory Visit: Payer: Self-pay

## 2021-01-03 DIAGNOSIS — B20 Human immunodeficiency virus [HIV] disease: Secondary | ICD-10-CM

## 2021-01-05 LAB — T-HELPER CELL (CD4) - (RCID CLINIC ONLY)
CD4 % Helper T Cell: 19 % — ABNORMAL LOW (ref 33–65)
CD4 T Cell Abs: 645 /uL (ref 400–1790)

## 2021-01-11 LAB — COMPLETE METABOLIC PANEL WITH GFR
AG Ratio: 1.4 (calc) (ref 1.0–2.5)
ALT: 14 U/L (ref 6–29)
AST: 17 U/L (ref 10–30)
Albumin: 4.3 g/dL (ref 3.6–5.1)
Alkaline phosphatase (APISO): 59 U/L (ref 31–125)
BUN: 13 mg/dL (ref 7–25)
CO2: 24 mmol/L (ref 20–32)
Calcium: 9.1 mg/dL (ref 8.6–10.2)
Chloride: 105 mmol/L (ref 98–110)
Creat: 0.88 mg/dL (ref 0.50–0.99)
Globulin: 3.1 g/dL (calc) (ref 1.9–3.7)
Glucose, Bld: 99 mg/dL (ref 65–99)
Potassium: 3.9 mmol/L (ref 3.5–5.3)
Sodium: 136 mmol/L (ref 135–146)
Total Bilirubin: 0.5 mg/dL (ref 0.2–1.2)
Total Protein: 7.4 g/dL (ref 6.1–8.1)
eGFR: 84 mL/min/{1.73_m2} (ref >=60)

## 2021-01-11 LAB — HIV RNA, RTPCR W/R GT (RTI, PI,INT)
HIV 1 RNA Quant: 35200 copies/mL — ABNORMAL HIGH
HIV-1 RNA Quant, Log: 4.55 Log copies/mL — ABNORMAL HIGH

## 2021-01-11 LAB — HIV-1 INTEGRASE GENOTYPE

## 2021-01-11 LAB — LIPID PANEL
Cholesterol: 162 mg/dL (ref <200)
HDL: 39 mg/dL — ABNORMAL LOW (ref >=50)
LDL Cholesterol (Calc): 105 mg/dL (calc) — ABNORMAL HIGH
Non-HDL Cholesterol (Calc): 123 mg/dL (calc) (ref <130)
Total CHOL/HDL Ratio: 4.2 (calc) (ref <5.0)
Triglycerides: 89 mg/dL (ref <150)

## 2021-01-11 LAB — RPR: RPR Ser Ql: NONREACTIVE

## 2021-01-11 LAB — HIV-1 GENOTYPE: HIV-1 Genotype: DETECTED — AB

## 2021-01-12 ENCOUNTER — Other Ambulatory Visit (HOSPITAL_COMMUNITY): Payer: Self-pay

## 2021-01-12 MED FILL — Glucose Blood Test Strip: 25 days supply | Qty: 100 | Fill #0 | Status: AC

## 2021-01-12 MED FILL — Lancets: 25 days supply | Qty: 100 | Fill #0 | Status: CN

## 2021-01-19 ENCOUNTER — Other Ambulatory Visit (HOSPITAL_COMMUNITY): Payer: Self-pay

## 2021-01-24 ENCOUNTER — Other Ambulatory Visit (HOSPITAL_COMMUNITY): Payer: Self-pay

## 2021-01-25 ENCOUNTER — Other Ambulatory Visit: Payer: Self-pay

## 2021-01-25 ENCOUNTER — Telehealth: Payer: Self-pay

## 2021-01-25 ENCOUNTER — Ambulatory Visit (HOSPITAL_COMMUNITY)
Admission: EM | Admit: 2021-01-25 | Discharge: 2021-01-25 | Disposition: A | Discharge status: Home / Self Care | Payer: 59 | Attending: Emergency Medicine | Admitting: Emergency Medicine

## 2021-01-25 ENCOUNTER — Encounter (HOSPITAL_COMMUNITY): Payer: Self-pay | Admitting: Emergency Medicine

## 2021-01-25 ENCOUNTER — Other Ambulatory Visit (HOSPITAL_COMMUNITY): Payer: Self-pay

## 2021-01-25 ENCOUNTER — Other Ambulatory Visit: Payer: Self-pay | Admitting: Pharmacist

## 2021-01-25 DIAGNOSIS — J45901 Unspecified asthma with (acute) exacerbation: Secondary | ICD-10-CM

## 2021-01-25 DIAGNOSIS — J441 Chronic obstructive pulmonary disease with (acute) exacerbation: Secondary | ICD-10-CM

## 2021-01-25 DIAGNOSIS — R110 Nausea: Secondary | ICD-10-CM

## 2021-01-25 MED ORDER — AMOXICILLIN-POT CLAVULANATE 875-125 MG PO TABS: 1.0000 | ORAL_TABLET | Freq: Two times a day (BID) | ORAL | 0 refills | Status: AC

## 2021-01-25 MED ORDER — PREDNISONE 50 MG PO TABS: 50.0000 mg | ORAL_TABLET | Freq: Every day | ORAL | 0 refills | Status: DC

## 2021-01-25 MED ORDER — PROMETHAZINE-DM 6.25-15 MG/5ML PO SYRP: 5.0000 mL | ORAL_SOLUTION | Freq: Four times a day (QID) | ORAL | 0 refills | Status: DC | PRN

## 2021-01-25 MED ORDER — AEROCHAMBER PLUS MISC: 2 refills | Status: AC

## 2021-01-25 MED ORDER — PROMETHAZINE HCL 6.25 MG/5ML PO SYRP
6.2500 mg | ORAL_SOLUTION | Freq: Three times a day (TID) | ORAL | 0 refills | Status: DC | PRN
Start: 2021-01-25 — End: 2021-01-25
  Filled 2021-01-25: qty 240, 16d supply, fill #0

## 2021-01-25 MED ORDER — ALBUTEROL SULFATE (2.5 MG/3ML) 0.083% IN NEBU: 2.5000 mg | INHALATION_SOLUTION | RESPIRATORY_TRACT | 0 refills | Status: DC | PRN

## 2021-01-25 MED ORDER — ALBUTEROL SULFATE HFA 108 (90 BASE) MCG/ACT IN AERS: 1.0000 | INHALATION_SPRAY | Freq: Four times a day (QID) | RESPIRATORY_TRACT | 0 refills | Status: DC | PRN
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
## 2021-01-25 NOTE — Discharge Instructions (Addendum)
2 puffs from your albuterol inhaler using your spacer every 4 hours for 2 days, then every 6 hours for 2 days, then as needed.  May back off on the albuterol as you start to feel better.  Finish the prednisone and Augmentin unless a provider tells you to stop.  Promethazine DM as needed for cough.

## 2021-01-25 NOTE — Telephone Encounter (Signed)
[name]  Patient's medications have been couriered to [facility] from [facility] and will be picked up .  Ileene Patrick , [facility] Patient [facility] Phone: [phone] Fax:  [phone]

## 2021-01-25 NOTE — ED Triage Notes (Addendum)
Pt is present today with a productive cough that started x3 days ago . Pt states she has a hx of bronchitis. Pt also states that she also having recurrent right foot pain. Pt states that she use to wear the post op show that she given at her last visit but it makes her pain worse. Pt states that has now noticed some selling along her right foot near her toes.

## 2021-01-25 NOTE — Progress Notes (Signed)
Patient came to pick up next sample, and she complained of nausea after restarting the medicine. I told her this will like pass over the next couple of weeks. She is taking it with food and states last time she restarted Biktarvy she had to go to the ED because of the side effects. She said they gave her liquid phenergan and it helped. I prescribed 2 weeks worth to help with the side effects until they pass.  Alfonse Spruce, PharmD, CPP Clinical Pharmacist Practitioner [facility]

## 2021-02-12 ENCOUNTER — Other Ambulatory Visit (HOSPITAL_COMMUNITY): Payer: Self-pay

## 2021-02-15 ENCOUNTER — Other Ambulatory Visit (HOSPITAL_COMMUNITY): Payer: Self-pay

## 2021-02-15 ENCOUNTER — Ambulatory Visit (INDEPENDENT_AMBULATORY_CARE_PROVIDER_SITE_OTHER): Payer: 59 | Admitting: Family

## 2021-02-15 ENCOUNTER — Encounter: Payer: Self-pay | Admitting: Family

## 2021-02-15 ENCOUNTER — Other Ambulatory Visit: Payer: Self-pay

## 2021-02-15 VITALS — BP 137/95 | HR 103 | Temp 98.7°F | Ht 64.0 in | Wt 225.0 lb

## 2021-02-15 DIAGNOSIS — Z0000 Encounter for general adult medical examination without abnormal findings: Secondary | ICD-10-CM | POA: Diagnosis not present

## 2021-02-15 DIAGNOSIS — B20 Human immunodeficiency virus [HIV] disease: Secondary | ICD-10-CM

## 2021-02-15 NOTE — Assessment & Plan Note (Signed)
Lori Ford has good adherence and tolerance to her ART regimen of Biktarvy.  No signs/symptoms of opportunistic infection.  Genotype performed with no significant resistance patterns.  We reviewed lab work and discussed plan of care.  She has no problems obtaining medication from the pharmacy.  Continue current dose of Biktarvy.  Check blood work today.  Plan for follow-up in 1 month or sooner if needed with lab work on the same day.
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
## 2021-02-15 NOTE — Patient Instructions (Addendum)
Nice to see you.  We will check your lab work today.  Continue to take your medication daily as prescribed.  Refills have been sent to the pharmacy.  Plan for follow up in 1 month or sooner if needed with lab work on the same day.  Have a great day and stay safe!  Great job!

## 2021-02-15 NOTE — Assessment & Plan Note (Signed)
   Discussed importance of safe sexual practice and condom use. Condoms offered.   Vaccines declined.

## 2021-02-16 LAB — T-HELPER CELL (CD4) - (RCID CLINIC ONLY)
CD4 % Helper T Cell: 19 % — ABNORMAL LOW (ref 33–65)
CD4 T Cell Abs: 421 /uL (ref 400–1790)

## 2021-02-17 LAB — HIV-1 RNA QUANT-NO REFLEX-BLD
HIV 1 RNA Quant: NOT DETECTED Copies/mL
HIV-1 RNA Quant, Log: NOT DETECTED Log cps/mL

## 2021-02-23 ENCOUNTER — Other Ambulatory Visit (HOSPITAL_COMMUNITY): Payer: Self-pay

## 2021-02-26 ENCOUNTER — Telehealth: Payer: Self-pay

## 2021-02-26 NOTE — Telephone Encounter (Signed)
[name]  Patient's medications have been couriered to [facility] from [place] and will be picked up on 03/02/21.  Ileene Patrick , [facility] Patient [facility] Phone: [phone] Fax:  [phone]

## 2021-02-28 ENCOUNTER — Other Ambulatory Visit (HOSPITAL_COMMUNITY): Payer: Self-pay

## 2021-03-05 ENCOUNTER — Other Ambulatory Visit (HOSPITAL_COMMUNITY): Payer: Self-pay

## 2021-03-19 ENCOUNTER — Other Ambulatory Visit: Payer: Self-pay

## 2021-03-19 ENCOUNTER — Encounter: Payer: Self-pay | Admitting: Family

## 2021-03-19 ENCOUNTER — Ambulatory Visit (INDEPENDENT_AMBULATORY_CARE_PROVIDER_SITE_OTHER): Payer: 59 | Admitting: Family

## 2021-03-19 ENCOUNTER — Ambulatory Visit: Payer: 59 | Admitting: Family

## 2021-03-19 ENCOUNTER — Other Ambulatory Visit (HOSPITAL_COMMUNITY): Payer: Self-pay

## 2021-03-19 VITALS — BP 129/74 | HR 58 | Temp 98.4°F | Wt 224.0 lb

## 2021-03-19 DIAGNOSIS — B20 Human immunodeficiency virus [HIV] disease: Secondary | ICD-10-CM

## 2021-03-19 DIAGNOSIS — Z0000 Encounter for general adult medical examination without abnormal findings: Secondary | ICD-10-CM | POA: Diagnosis not present

## 2021-03-19 MED ORDER — BIKTARVY 50-200-25 MG PO TABS
1.0000 | ORAL_TABLET | Freq: Every day | ORAL | 4 refills | Status: DC
  Filled 2021-03-19 – 2021-04-12 (×2): qty 30, 30d supply, fill #0
  Filled 2021-05-08: qty 30, 30d supply, fill #1
  Filled 2021-06-04: qty 30, 30d supply, fill #2
  Filled 2021-06-05: qty 30, 30d supply, fill #0
  Filled 2021-06-05: qty 30, 30d supply, fill #2
  Filled 2021-06-26 – 2021-07-09 (×2): qty 30, 30d supply, fill #0
  Filled 2021-07-26: qty 30, 30d supply, fill #1
  Filled 2021-08-23: qty 30, 30d supply, fill #2
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
## 2021-03-19 NOTE — Assessment & Plan Note (Signed)
Lori Ford has been off medication for approximately 1 week secondary to inability to pick up medication due to work.  Discussed possible solutions and she will pick up her medication from the pharmacy directly.  No signs/symptoms of opportunistic infection.  Tolerating medication without problems.  Continue current dose of Biktarvy.  Plan for follow-up in 2 months or sooner if needed with lab work on the same day.

## 2021-03-19 NOTE — Assessment & Plan Note (Signed)
   Discussed importance of safe sexual practices and condom use.  Condoms offered.

## 2021-03-19 NOTE — Patient Instructions (Signed)
Nice to see you.   We will check your lab work.   Continue to take your medication as prescribed.   Refills will be sent to the pharmacy.  Plan for follow up in 2 months or sooner if needed with lab work on the same day.   Have a great day and stay safe!

## 2021-03-20 ENCOUNTER — Other Ambulatory Visit (HOSPITAL_COMMUNITY): Payer: Self-pay

## 2021-03-28 ENCOUNTER — Other Ambulatory Visit (HOSPITAL_COMMUNITY): Payer: Self-pay

## 2021-04-12 ENCOUNTER — Other Ambulatory Visit (HOSPITAL_COMMUNITY): Payer: Self-pay

## 2021-04-13 ENCOUNTER — Telehealth: Payer: Self-pay

## 2021-04-13 NOTE — Telephone Encounter (Signed)
[name]  Patient's medications have been couriered to [facility] from [facility] and will be picked up 04/17/21.  Ileene Patrick , [facility] Patient [facility] Phone: [phone] Fax:  [phone]

## 2021-04-19 NOTE — Telephone Encounter (Signed)
Ok thank you 

## 2021-04-19 NOTE — Telephone Encounter (Signed)
Patient will come by the office today to pick up medication. Lori Ford

## 2021-04-24 ENCOUNTER — Ambulatory Visit (INDEPENDENT_AMBULATORY_CARE_PROVIDER_SITE_OTHER): Payer: Medicaid Other

## 2021-04-24 ENCOUNTER — Ambulatory Visit (HOSPITAL_COMMUNITY)
Admission: EM | Admit: 2021-04-24 | Discharge: 2021-04-24 | Disposition: A | Discharge status: Home / Self Care | Payer: Medicaid Other | Attending: Physician Assistant | Admitting: Physician Assistant

## 2021-04-24 ENCOUNTER — Other Ambulatory Visit: Payer: Self-pay

## 2021-04-24 ENCOUNTER — Encounter (HOSPITAL_COMMUNITY): Payer: Self-pay

## 2021-04-24 DIAGNOSIS — J4541 Moderate persistent asthma with (acute) exacerbation: Secondary | ICD-10-CM | POA: Diagnosis not present

## 2021-04-24 DIAGNOSIS — J40 Bronchitis, not specified as acute or chronic: Secondary | ICD-10-CM

## 2021-04-24 DIAGNOSIS — J329 Chronic sinusitis, unspecified: Secondary | ICD-10-CM

## 2021-04-24 DIAGNOSIS — R059 Cough, unspecified: Secondary | ICD-10-CM

## 2021-04-24 MED ORDER — PROMETHAZINE-DM 6.25-15 MG/5ML PO SYRP: 5.0000 mL | ORAL_SOLUTION | Freq: Two times a day (BID) | ORAL | 0 refills | Status: DC | PRN

## 2021-04-24 MED ORDER — PREDNISONE 10 MG (21) PO TBPK: ORAL_TABLET | ORAL | 0 refills | Status: DC

## 2021-04-24 MED ORDER — METHYLPREDNISOLONE SODIUM SUCC 125 MG IJ SOLR
60.0000 mg | Freq: Once | INTRAMUSCULAR | Status: AC
  Administered 2021-04-24: 60 mg via INTRAMUSCULAR

## 2021-04-24 MED ORDER — AZITHROMYCIN 250 MG PO TABS: 250.0000 mg | ORAL_TABLET | Freq: Every day | ORAL | 0 refills | Status: DC

## 2021-04-24 MED ORDER — METHYLPREDNISOLONE SODIUM SUCC 125 MG IJ SOLR
INTRAMUSCULAR | Status: AC
  Filled 2021-04-24: qty 2

## 2021-04-24 NOTE — Discharge Instructions (Signed)
Your x-ray was normal.  Please start azithromycin.  Start prednisone taper.  Do not take NSAIDs including aspirin, ibuprofen/Advil, naproxen/Aleve with this medication as it can cause stomach bleeding.  Use Tylenol, Mucinex, Flonase for additional symptom relief.  I have called in Promethazine DM for cough.  As we discussed this can increase the risk of seizures so please use this at a small dose and as infrequently as possible.  Do not drive or drink alcohol with this medication as drowsiness is a common side effect.  Make sure you rest and drink plenty of fluid.  Continue maintenance medications as previously prescribed.  If you have any worsening symptoms including shortness of breath, fever, chest pain you need to go to the emergency room.

## 2021-04-24 NOTE — ED Triage Notes (Signed)
Pt presents with cough, eye itching, chest tightness that began 3 days ago. Pt taking OTC robitussin and mucinex
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
## 2021-04-30 ENCOUNTER — Ambulatory Visit (HOSPITAL_COMMUNITY)
Admission: EM | Admit: 2021-04-30 | Discharge: 2021-04-30 | Discharge status: Against Medical Advice | Payer: Medicaid Other

## 2021-04-30 ENCOUNTER — Encounter (HOSPITAL_COMMUNITY): Payer: Self-pay | Admitting: Emergency Medicine

## 2021-04-30 ENCOUNTER — Other Ambulatory Visit: Payer: Self-pay

## 2021-04-30 ENCOUNTER — Other Ambulatory Visit: Payer: Medicaid Other

## 2021-04-30 DIAGNOSIS — Z113 Encounter for screening for infections with a predominantly sexual mode of transmission: Secondary | ICD-10-CM

## 2021-04-30 DIAGNOSIS — B20 Human immunodeficiency virus [HIV] disease: Secondary | ICD-10-CM

## 2021-04-30 NOTE — ED Notes (Signed)
Patient states she has an emergency with child and must leave

## 2021-04-30 NOTE — ED Triage Notes (Signed)
Patient reports onset of symptoms last week.  Patient was seen on 04/24/2021.  Symptoms did improve, but tight and congestion have worsened again.  Covid test last night read negative.

## 2021-05-01 LAB — T-HELPER CELL (CD4) - (RCID CLINIC ONLY)
CD4 % Helper T Cell: 22 % — ABNORMAL LOW (ref 33–65)
CD4 T Cell Abs: 508 /uL (ref 400–1790)

## 2021-05-02 LAB — CBC WITH DIFFERENTIAL/PLATELET
Absolute Monocytes: 409 cells/uL (ref 200–950)
Basophils Absolute: 29 cells/uL (ref 0–200)
Basophils Relative: 0.3 %
Eosinophils Absolute: 0 cells/uL — ABNORMAL LOW (ref 15–500)
Eosinophils Relative: 0 %
HCT: 38.3 % (ref 35.0–45.0)
Hemoglobin: 12.9 g/dL (ref 11.7–15.5)
Lymphs Abs: 2432 cells/uL (ref 850–3900)
MCH: 31.2 pg (ref 27.0–33.0)
MCHC: 33.7 g/dL (ref 32.0–36.0)
MCV: 92.7 fL (ref 80.0–100.0)
MPV: 12 fL (ref 7.5–12.5)
Monocytes Relative: 4.3 %
Neutro Abs: 6631 cells/uL (ref 1500–7800)
Neutrophils Relative %: 69.8 %
Platelets: 270 10*3/uL (ref 140–400)
RBC: 4.13 10*6/uL (ref 3.80–5.10)
RDW: 13.9 % (ref 11.0–15.0)
Total Lymphocyte: 25.6 %
WBC: 9.5 10*3/uL (ref 3.8–10.8)

## 2021-05-02 LAB — COMPLETE METABOLIC PANEL WITH GFR
AG Ratio: 1.5 (calc) (ref 1.0–2.5)
ALT: 10 U/L (ref 6–29)
AST: 9 U/L — ABNORMAL LOW (ref 10–30)
Albumin: 4.1 g/dL (ref 3.6–5.1)
Alkaline phosphatase (APISO): 46 U/L (ref 31–125)
BUN/Creatinine Ratio: 19 (calc) (ref 6–22)
BUN: 20 mg/dL (ref 7–25)
CO2: 27 mmol/L (ref 20–32)
Calcium: 9.7 mg/dL (ref 8.6–10.2)
Chloride: 105 mmol/L (ref 98–110)
Creat: 1.08 mg/dL — ABNORMAL HIGH (ref 0.50–0.99)
Globulin: 2.7 g/dL (calc) (ref 1.9–3.7)
Glucose, Bld: 108 mg/dL — ABNORMAL HIGH (ref 65–99)
Potassium: 4.2 mmol/L (ref 3.5–5.3)
Sodium: 139 mmol/L (ref 135–146)
Total Bilirubin: 0.5 mg/dL (ref 0.2–1.2)
Total Protein: 6.8 g/dL (ref 6.1–8.1)
eGFR: 65 mL/min/{1.73_m2} (ref >=60)

## 2021-05-02 LAB — HIV-1 RNA QUANT-NO REFLEX-BLD
HIV 1 RNA Quant: NOT DETECTED Copies/mL
HIV-1 RNA Quant, Log: NOT DETECTED Log cps/mL

## 2021-05-02 LAB — RPR: RPR Ser Ql: NONREACTIVE

## 2021-05-08 ENCOUNTER — Other Ambulatory Visit (HOSPITAL_COMMUNITY): Payer: Self-pay

## 2021-05-09 ENCOUNTER — Telehealth: Payer: Self-pay

## 2021-05-09 NOTE — Telephone Encounter (Signed)
[name]  Patient's medications have been couriered to [facility] from [facility] and will be picked up 05/14/21.  Ileene Patrick , [facility] Patient [facility] Phone: [phone] Fax:  [phone]

## 2021-05-14 ENCOUNTER — Other Ambulatory Visit: Payer: Self-pay

## 2021-05-14 ENCOUNTER — Ambulatory Visit (INDEPENDENT_AMBULATORY_CARE_PROVIDER_SITE_OTHER): Payer: Medicaid Other | Admitting: Family

## 2021-05-14 ENCOUNTER — Encounter: Payer: Self-pay | Admitting: Family

## 2021-05-14 VITALS — BP 141/91 | HR 72 | Temp 98.7°F | Ht 64.0 in | Wt 225.0 lb

## 2021-05-14 DIAGNOSIS — B20 Human immunodeficiency virus [HIV] disease: Secondary | ICD-10-CM | POA: Diagnosis present

## 2021-05-14 DIAGNOSIS — Z0000 Encounter for general adult medical examination without abnormal findings: Secondary | ICD-10-CM

## 2021-05-14 DIAGNOSIS — Z113 Encounter for screening for infections with a predominantly sexual mode of transmission: Secondary | ICD-10-CM

## 2021-05-14 DIAGNOSIS — Z23 Encounter for immunization: Secondary | ICD-10-CM | POA: Diagnosis not present

## 2021-05-14 NOTE — Patient Instructions (Signed)
Nice to see you.  Continue to take your medication daily as prescribed.  Refills have been sent to the pharmacy.  Plan for follow up in 3 months or sooner if needed with lab work 1-2 weeks prior to appointment.   Have a great day and stay safe!  

## 2021-05-14 NOTE — Assessment & Plan Note (Addendum)
·   Discussed importance of safe sexual practice and condom use.  Condoms offered.  Menveo and tetanus updated.  She will schedule routine dental care and can refer to [facility] if needed.
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
## 2021-05-16 ENCOUNTER — Ambulatory Visit (HOSPITAL_COMMUNITY): Payer: Medicaid Other

## 2021-05-16 ENCOUNTER — Ambulatory Visit (HOSPITAL_COMMUNITY)
Admission: EM | Admit: 2021-05-16 | Discharge: 2021-05-16 | Disposition: A | Discharge status: Home / Self Care | Payer: Medicaid Other | Attending: Family Medicine | Admitting: Family Medicine

## 2021-05-16 ENCOUNTER — Encounter (HOSPITAL_COMMUNITY): Payer: Self-pay | Admitting: *Deleted

## 2021-05-16 ENCOUNTER — Other Ambulatory Visit: Payer: Self-pay

## 2021-05-16 NOTE — ED Triage Notes (Signed)
Pt seen on 04-30-21 for bronchitis and is still having Sx's. Pt reports she was instructed to return to [facility] if Sx's did not improve.

## 2021-05-16 NOTE — ED Notes (Signed)
Pt has not returned to [facility] . Provider DR Mannie Stabile informed Pt left and has not returned.  Plan to DC Pt fron [facility]

## 2021-05-16 NOTE — ED Notes (Signed)
TC to Pt because she was absent from room Pt left [facility] for unknown reasons. Pt answered her Mobile and reported she had to step out for a bit.

## 2021-05-23 ENCOUNTER — Ambulatory Visit: Payer: Medicaid Other | Admitting: Orthopaedic Surgery

## 2021-05-29 ENCOUNTER — Emergency Department (HOSPITAL_COMMUNITY)
Admission: EM | Admit: 2021-05-29 | Discharge: 2021-05-30 | Disposition: A | Discharge status: Home / Self Care | Payer: Medicaid Other | Attending: Emergency Medicine | Admitting: Emergency Medicine

## 2021-05-29 DIAGNOSIS — M549 Dorsalgia, unspecified: Secondary | ICD-10-CM | POA: Insufficient documentation

## 2021-05-29 DIAGNOSIS — Z5321 Procedure and treatment not carried out due to patient leaving prior to being seen by health care provider: Secondary | ICD-10-CM | POA: Insufficient documentation

## 2021-05-29 NOTE — ED Notes (Signed)
Called several times and no answer for triage

## 2021-06-01 ENCOUNTER — Other Ambulatory Visit (HOSPITAL_COMMUNITY): Payer: Self-pay

## 2021-06-04 ENCOUNTER — Other Ambulatory Visit (HOSPITAL_COMMUNITY): Payer: Self-pay

## 2021-06-05 ENCOUNTER — Other Ambulatory Visit (HOSPITAL_COMMUNITY): Payer: Self-pay

## 2021-06-05 ENCOUNTER — Other Ambulatory Visit: Payer: Self-pay

## 2021-06-06 ENCOUNTER — Ambulatory Visit: Payer: Medicaid Other | Admitting: Orthopaedic Surgery

## 2021-06-11 ENCOUNTER — Telehealth: Payer: Self-pay | Admitting: Orthopaedic Surgery

## 2021-06-11 NOTE — Telephone Encounter (Signed)
Pt called to reschedule appt. Pt need a 30 min slot.Marland Kitchen Spoke with pt and made appt for 15 mins and had to cancel after call. Tried to call pt several times to reschedule right after hanging up and no answer. Left pt a voicemail to reschedule to 30 mins with Dr. Lorin Mercy

## 2021-06-11 NOTE — Telephone Encounter (Signed)
Pt want to change referral to see a PA sooner. Informed pt Cassandra T. Would call her back if any changes need to be made for referral. Please call pt at [phone].

## 2021-06-11 NOTE — Telephone Encounter (Signed)
Cassandra to speak with patient and schedule appt.

## 2021-06-12 ENCOUNTER — Other Ambulatory Visit: Payer: Self-pay

## 2021-06-13 ENCOUNTER — Other Ambulatory Visit: Payer: Self-pay

## 2021-06-21 ENCOUNTER — Other Ambulatory Visit: Payer: Self-pay

## 2021-06-21 ENCOUNTER — Ambulatory Visit (HOSPITAL_COMMUNITY)
Admission: EM | Admit: 2021-06-21 | Discharge: 2021-06-21 | Disposition: A | Discharge status: Home / Self Care | Payer: Medicaid Other | Attending: Internal Medicine | Admitting: Internal Medicine

## 2021-06-21 ENCOUNTER — Encounter (HOSPITAL_COMMUNITY): Payer: Self-pay

## 2021-06-21 DIAGNOSIS — J301 Allergic rhinitis due to pollen: Secondary | ICD-10-CM | POA: Diagnosis not present

## 2021-06-21 DIAGNOSIS — J4531 Mild persistent asthma with (acute) exacerbation: Secondary | ICD-10-CM | POA: Diagnosis not present

## 2021-06-21 MED ORDER — MONTELUKAST SODIUM 10 MG PO TABS: 10.0000 mg | ORAL_TABLET | Freq: Every day | ORAL | 1 refills | Status: DC

## 2021-06-21 MED ORDER — PREDNISONE 20 MG PO TABS: 20.0000 mg | ORAL_TABLET | Freq: Every day | ORAL | 0 refills | Status: AC

## 2021-06-21 MED ORDER — PROMETHAZINE-DM 6.25-15 MG/5ML PO SYRP: 5.0000 mL | ORAL_SOLUTION | Freq: Two times a day (BID) | ORAL | 0 refills | Status: DC | PRN

## 2021-06-21 MED ORDER — FLUTICASONE PROPIONATE 50 MCG/ACT NA SUSP: 2.0000 | Freq: Every day | NASAL | 2 refills | Status: DC

## 2021-06-21 NOTE — ED Triage Notes (Signed)
Pt c/o facial pressure and seasonal allergies flaring up.  ?
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
## 2021-06-21 NOTE — Discharge Instructions (Addendum)
Please use medications as prescribed ?Continue with humidifier use ?Maintain adequate hydration ?Return to urgent care if symptoms worsen. ?

## 2021-06-23 ENCOUNTER — Other Ambulatory Visit: Payer: Self-pay

## 2021-06-23 ENCOUNTER — Ambulatory Visit (HOSPITAL_COMMUNITY)
Admission: EM | Admit: 2021-06-23 | Discharge: 2021-06-23 | Disposition: A | Discharge status: Home / Self Care | Payer: Medicaid Other | Attending: Family Medicine | Admitting: Family Medicine

## 2021-06-23 ENCOUNTER — Ambulatory Visit (HOSPITAL_COMMUNITY): Payer: Medicaid Other

## 2021-06-23 ENCOUNTER — Encounter (HOSPITAL_COMMUNITY): Payer: Self-pay | Admitting: *Deleted

## 2021-06-23 DIAGNOSIS — S39012A Strain of muscle, fascia and tendon of lower back, initial encounter: Secondary | ICD-10-CM

## 2021-06-23 DIAGNOSIS — S161XXA Strain of muscle, fascia and tendon at neck level, initial encounter: Secondary | ICD-10-CM | POA: Diagnosis not present
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
## 2021-06-24 VITALS — BP 122/87 | HR 78 | Temp 97.7°F | Resp 20

## 2021-06-25 ENCOUNTER — Other Ambulatory Visit: Payer: Self-pay

## 2021-06-25 ENCOUNTER — Encounter (HOSPITAL_COMMUNITY): Payer: Self-pay

## 2021-06-25 ENCOUNTER — Ambulatory Visit (HOSPITAL_COMMUNITY): Payer: Self-pay

## 2021-06-25 ENCOUNTER — Ambulatory Visit (HOSPITAL_COMMUNITY)
Admission: RE | Admit: 2021-06-25 | Discharge: 2021-06-25 | Disposition: A | Discharge status: Home / Self Care | Payer: Medicaid Other | Source: Ambulatory Visit | Attending: Family Medicine | Admitting: Family Medicine

## 2021-06-25 DIAGNOSIS — S39012D Strain of muscle, fascia and tendon of lower back, subsequent encounter: Secondary | ICD-10-CM

## 2021-06-25 DIAGNOSIS — S161XXD Strain of muscle, fascia and tendon at neck level, subsequent encounter: Secondary | ICD-10-CM | POA: Diagnosis not present

## 2021-06-25 MED ORDER — DIAZEPAM 5 MG PO TABS: 5.0000 mg | ORAL_TABLET | Freq: Two times a day (BID) | ORAL | 0 refills | Status: DC | PRN
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
## 2021-06-26 ENCOUNTER — Other Ambulatory Visit (HOSPITAL_COMMUNITY): Payer: Self-pay
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
## 2021-07-02 ENCOUNTER — Ambulatory Visit (INDEPENDENT_AMBULATORY_CARE_PROVIDER_SITE_OTHER): Payer: Medicaid Other | Admitting: Family Medicine

## 2021-07-02 VITALS — BP 134/87 | Ht 64.0 in | Wt 226.0 lb

## 2021-07-02 DIAGNOSIS — M25562 Pain in left knee: Secondary | ICD-10-CM | POA: Diagnosis present

## 2021-07-02 DIAGNOSIS — M545 Low back pain, unspecified: Secondary | ICD-10-CM

## 2021-07-02 DIAGNOSIS — M542 Cervicalgia: Secondary | ICD-10-CM

## 2021-07-02 MED ORDER — PREDNISONE 10 MG PO TABS: ORAL_TABLET | ORAL | 0 refills | Status: DC

## 2021-07-02 NOTE — Patient Instructions (Signed)
Get x-rays of your neck, back, left knee after you leave today. ?We will contact you with the results and next steps. ?You definitely strained the muscles of your neck and low back, have at least a left knee contusion. ?If the x-rays look good I would recommend physical therapy for the neck and back. ?Continue your tylenol, heat, epsom salt baths, flexeril, gabapentin, with oxycodone as needed. ?Take prednisone dose pack x 6 days - this should help with all issues. ?Follow up typically in about 4 weeks unless x-rays change our management. ?

## 2021-07-03 ENCOUNTER — Ambulatory Visit
Admission: RE | Admit: 2021-07-03 | Discharge: 2021-07-03 | Disposition: A | Discharge status: Home / Self Care | Payer: Self-pay | Source: Ambulatory Visit | Attending: Family Medicine | Admitting: Family Medicine

## 2021-07-03 ENCOUNTER — Other Ambulatory Visit (HOSPITAL_COMMUNITY): Payer: Self-pay

## 2021-07-03 ENCOUNTER — Other Ambulatory Visit: Payer: Self-pay

## 2021-07-03 DIAGNOSIS — M545 Low back pain, unspecified: Secondary | ICD-10-CM

## 2021-07-03 DIAGNOSIS — M25562 Pain in left knee: Secondary | ICD-10-CM

## 2021-07-03 DIAGNOSIS — M542 Cervicalgia: Secondary | ICD-10-CM

## 2021-07-03 MED ORDER — IPRATROPIUM-ALBUTEROL 0.5-2.5 (3) MG/3ML IN SOLN
RESPIRATORY_TRACT | Status: AC
  Filled 2021-07-03: qty 3
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
## 2021-07-04 ENCOUNTER — Other Ambulatory Visit (HOSPITAL_COMMUNITY): Payer: Self-pay

## 2021-07-06 ENCOUNTER — Other Ambulatory Visit: Payer: Self-pay

## 2021-07-06 DIAGNOSIS — M545 Low back pain, unspecified: Secondary | ICD-10-CM

## 2021-07-06 DIAGNOSIS — M542 Cervicalgia: Secondary | ICD-10-CM

## 2021-07-09 ENCOUNTER — Other Ambulatory Visit (HOSPITAL_COMMUNITY): Payer: Self-pay

## 2021-07-10 ENCOUNTER — Telehealth: Payer: Self-pay

## 2021-07-10 NOTE — Telephone Encounter (Signed)
[name] ? ?Patient's medications have been couriered to [facility] from [facility] and will be picked up 07/10/21. ? ?Ileene Patrick , CPhT ?Specialty Pharmacy Patient Advocate ?[facility] ?Phone: [phone] ?Fax:  [phone]  ?

## 2021-07-11 ENCOUNTER — Other Ambulatory Visit: Payer: Self-pay

## 2021-07-11 ENCOUNTER — Ambulatory Visit (INDEPENDENT_AMBULATORY_CARE_PROVIDER_SITE_OTHER): Payer: Medicaid Other | Admitting: Orthopaedic Surgery

## 2021-07-11 ENCOUNTER — Encounter: Payer: Self-pay | Admitting: Orthopaedic Surgery

## 2021-07-11 VITALS — BP 134/88 | HR 72 | Ht 64.0 in | Wt 226.0 lb

## 2021-07-11 DIAGNOSIS — M545 Low back pain, unspecified: Secondary | ICD-10-CM | POA: Diagnosis not present

## 2021-07-11 DIAGNOSIS — G8929 Other chronic pain: Secondary | ICD-10-CM

## 2021-07-11 DIAGNOSIS — M47812 Spondylosis without myelopathy or radiculopathy, cervical region: Secondary | ICD-10-CM

## 2021-07-11 DIAGNOSIS — M4722 Other spondylosis with radiculopathy, cervical region: Secondary | ICD-10-CM | POA: Diagnosis not present
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
## 2021-07-26 ENCOUNTER — Other Ambulatory Visit (HOSPITAL_COMMUNITY): Payer: Self-pay

## 2021-07-30 ENCOUNTER — Ambulatory Visit: Payer: No Typology Code available for payment source | Admitting: Physical Therapy

## 2021-07-30 ENCOUNTER — Ambulatory Visit: Payer: Medicaid Other | Admitting: Family Medicine
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
## 2021-07-31 ENCOUNTER — Ambulatory Visit: Payer: Medicaid Other | Attending: Family Medicine | Admitting: Physical Therapy

## 2021-07-31 ENCOUNTER — Other Ambulatory Visit (HOSPITAL_COMMUNITY): Payer: Self-pay

## 2021-07-31 ENCOUNTER — Other Ambulatory Visit: Payer: Self-pay

## 2021-07-31 ENCOUNTER — Encounter: Payer: Self-pay | Admitting: Physical Therapy

## 2021-07-31 DIAGNOSIS — M6281 Muscle weakness (generalized): Secondary | ICD-10-CM | POA: Diagnosis present

## 2021-07-31 DIAGNOSIS — M542 Cervicalgia: Secondary | ICD-10-CM | POA: Diagnosis present

## 2021-07-31 DIAGNOSIS — R293 Abnormal posture: Secondary | ICD-10-CM | POA: Insufficient documentation

## 2021-07-31 DIAGNOSIS — M5459 Other low back pain: Secondary | ICD-10-CM | POA: Diagnosis present

## 2021-07-31 NOTE — Patient Instructions (Signed)
Access Code: [email or web address] ?URL: [email or web address] ?Date: 07/31/2021 ?Prepared by: Hilda Blades ? ?Exercises ?- Prone Press Up On Elbows  - 2 x daily - 2 sets - 10 reps ?- Prone Hip Extension  - 2 x daily - 2 sets - 10 reps ?- Seated Pelvic Tilt  - 2-3 x daily - 3 sets - 10 reps ?- Seated Scapular Retraction  - 2-3 x daily - 10 reps - 3 seconds hold ?- Seated Neck Retraction  - 2-3 x daily - 10 reps - 3 seconds hold ?- Gentle Upper Trap Stretch  - 2-3 x daily - 3 reps - 15 seconds hold ?

## 2021-08-01 ENCOUNTER — Other Ambulatory Visit (HOSPITAL_COMMUNITY): Payer: Self-pay

## 2021-08-03 ENCOUNTER — Telehealth: Payer: Self-pay

## 2021-08-03 NOTE — Telephone Encounter (Signed)
[name] ? ?Patient's medications have been couriered to [facility] from [facility] and will be picked up 08/03/21. ? ?Ileene Patrick , CPhT ?Specialty Pharmacy Patient Advocate ?[facility] ?Phone: [phone] ?Fax:  [phone]  ?

## 2021-08-06 ENCOUNTER — Ambulatory Visit: Payer: Medicaid Other | Admitting: Physical Therapy

## 2021-08-06 ENCOUNTER — Encounter: Payer: Self-pay | Admitting: Physical Therapy

## 2021-08-06 ENCOUNTER — Ambulatory Visit (INDEPENDENT_AMBULATORY_CARE_PROVIDER_SITE_OTHER): Payer: Medicaid Other | Admitting: Family Medicine

## 2021-08-06 ENCOUNTER — Other Ambulatory Visit: Payer: Self-pay

## 2021-08-06 VITALS — BP 142/85 | Ht 64.0 in | Wt 213.0 lb

## 2021-08-06 DIAGNOSIS — M542 Cervicalgia: Secondary | ICD-10-CM | POA: Diagnosis not present

## 2021-08-06 DIAGNOSIS — M6281 Muscle weakness (generalized): Secondary | ICD-10-CM

## 2021-08-06 DIAGNOSIS — M5459 Other low back pain: Secondary | ICD-10-CM

## 2021-08-06 DIAGNOSIS — M545 Low back pain, unspecified: Secondary | ICD-10-CM

## 2021-08-06 DIAGNOSIS — M25562 Pain in left knee: Secondary | ICD-10-CM

## 2021-08-06 DIAGNOSIS — R293 Abnormal posture: Secondary | ICD-10-CM

## 2021-08-06 MED ORDER — PREDNISONE 10 MG PO TABS
ORAL_TABLET | ORAL | 0 refills | Status: DC
Start: 2021-08-06 — End: 2021-08-30
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
## 2021-08-06 NOTE — Patient Instructions (Signed)
You strained the muscles of your neck and low back, have at least a left knee contusion. ?Continue physical therapy as directed. ?Continue your tylenol, heat, epsom salt baths, flexeril, gabapentin, with oxycodone as needed. ?Take prednisone dose pack x 12 days. ?Follow up in 4 weeks. ?If not improving as expected we will consider MRI(s). ?
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
## 2021-08-09 ENCOUNTER — Other Ambulatory Visit: Payer: Self-pay

## 2021-08-09 ENCOUNTER — Ambulatory Visit: Payer: Medicaid Other | Admitting: Physical Therapy

## 2021-08-09 ENCOUNTER — Encounter: Payer: Self-pay | Admitting: Physical Therapy

## 2021-08-09 DIAGNOSIS — M5459 Other low back pain: Secondary | ICD-10-CM

## 2021-08-09 DIAGNOSIS — R293 Abnormal posture: Secondary | ICD-10-CM

## 2021-08-09 DIAGNOSIS — M542 Cervicalgia: Secondary | ICD-10-CM

## 2021-08-09 DIAGNOSIS — M6281 Muscle weakness (generalized): Secondary | ICD-10-CM
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
## 2021-08-15 ENCOUNTER — Ambulatory Visit (INDEPENDENT_AMBULATORY_CARE_PROVIDER_SITE_OTHER): Payer: Medicaid Other | Admitting: Orthopaedic Surgery

## 2021-08-15 ENCOUNTER — Ambulatory Visit: Payer: Medicaid Other | Admitting: Physical Therapy

## 2021-08-15 ENCOUNTER — Encounter: Payer: Self-pay | Admitting: Orthopaedic Surgery

## 2021-08-15 VITALS — BP 137/82 | HR 83 | Ht 64.0 in | Wt 214.0 lb

## 2021-08-15 DIAGNOSIS — M4722 Other spondylosis with radiculopathy, cervical region: Secondary | ICD-10-CM
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
## 2021-08-17 ENCOUNTER — Encounter: Payer: Self-pay | Admitting: Physical Therapy

## 2021-08-17 ENCOUNTER — Other Ambulatory Visit: Payer: Self-pay

## 2021-08-17 ENCOUNTER — Ambulatory Visit: Payer: Medicaid Other | Attending: Family Medicine | Admitting: Physical Therapy

## 2021-08-17 DIAGNOSIS — M6281 Muscle weakness (generalized): Secondary | ICD-10-CM | POA: Diagnosis present

## 2021-08-17 DIAGNOSIS — R293 Abnormal posture: Secondary | ICD-10-CM | POA: Diagnosis present

## 2021-08-17 DIAGNOSIS — M542 Cervicalgia: Secondary | ICD-10-CM | POA: Insufficient documentation

## 2021-08-17 DIAGNOSIS — M5459 Other low back pain: Secondary | ICD-10-CM | POA: Diagnosis present

## 2021-08-17 NOTE — Patient Instructions (Signed)
Access Code: [email or web address] ?URL: [email or web address] ?Date: 08/17/2021 ?Prepared by: Hilda Blades ? ?Exercises ?- Supine Lower Trunk Rotation  - 2 x daily - 2 sets - 10 reps ?- Supine Piriformis Stretch with Foot on Ground  - 2 x daily - 3 reps - 20 seconds hold ?- Hooklying Single Knee to Chest Stretch  - 2 x daily - 3 reps - 20 seconds hold ?- Child's Pose Stretch  - 2 x daily - 3 reps - 20 seconds hold ?- Seated Pelvic Tilt  - 2-3 x daily - 3 sets - 10 reps ?- Seated Scapular Retraction  - 2-3 x daily - 10 reps - 3 seconds hold ?- Seated Neck Retraction  - 2-3 x daily - 10 reps - 3 seconds hold ?- Gentle Upper Trap Stretch  - 2-3 x daily - 3 reps - 15 seconds hold ?
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
## 2021-08-22 ENCOUNTER — Encounter: Payer: Self-pay | Admitting: Physical Therapy

## 2021-08-22 ENCOUNTER — Other Ambulatory Visit: Payer: Self-pay

## 2021-08-22 ENCOUNTER — Ambulatory Visit: Payer: Medicaid Other | Admitting: Physical Therapy

## 2021-08-22 DIAGNOSIS — M542 Cervicalgia: Secondary | ICD-10-CM | POA: Diagnosis not present

## 2021-08-22 DIAGNOSIS — M5459 Other low back pain: Secondary | ICD-10-CM

## 2021-08-22 DIAGNOSIS — R293 Abnormal posture: Secondary | ICD-10-CM

## 2021-08-22 DIAGNOSIS — M6281 Muscle weakness (generalized): Secondary | ICD-10-CM
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
## 2021-08-23 ENCOUNTER — Other Ambulatory Visit (HOSPITAL_COMMUNITY): Payer: Self-pay

## 2021-08-24 ENCOUNTER — Other Ambulatory Visit: Payer: Self-pay

## 2021-08-24 ENCOUNTER — Ambulatory Visit: Payer: Medicaid Other | Admitting: Physical Therapy

## 2021-08-24 ENCOUNTER — Encounter: Payer: Self-pay | Admitting: Physical Therapy

## 2021-08-24 DIAGNOSIS — M542 Cervicalgia: Secondary | ICD-10-CM | POA: Diagnosis not present

## 2021-08-24 DIAGNOSIS — M6281 Muscle weakness (generalized): Secondary | ICD-10-CM

## 2021-08-24 DIAGNOSIS — R293 Abnormal posture: Secondary | ICD-10-CM

## 2021-08-24 DIAGNOSIS — M5459 Other low back pain: Secondary | ICD-10-CM

## 2021-08-24 NOTE — Patient Instructions (Signed)
Access Code: [email or web address] ?URL: [email or web address] ?Date: 08/24/2021 ?Prepared by: Hilda Blades ? ?Exercises ?- Supine Lower Trunk Rotation  - 2 x daily - 2 sets - 10 reps ?- Supine Piriformis Stretch with Foot on Ground  - 2 x daily - 3 reps - 20 seconds hold ?- Hooklying Single Knee to Chest Stretch  - 2 x daily - 3 reps - 20 seconds hold ?- Child's Pose Stretch  - 2 x daily - 3 reps - 20 seconds hold ?- Seated Pelvic Tilt  - 2-3 x daily - 3 sets - 10 reps ?- Seated Scapular Retraction  - 2-3 x daily - 10 reps - 3 seconds hold ?- Seated Neck Retraction  - 2-3 x daily - 10 reps - 3 seconds hold ?- Gentle Upper Trap Stretch  - 2-3 x daily - 3 reps - 15 seconds hold ?- Bridge  - 1 x daily - 2 sets - 10 reps ?- Straight Leg Raise  - 1 x daily - 2 sets - 10 reps ?- Sidelying Hip Abduction  - 1 x daily - 2 sets - 10 reps ?- Standing Row with Anchored Resistance  - 1 x daily - 2 sets - 10 reps ?
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
## 2021-08-28 ENCOUNTER — Ambulatory Visit: Payer: Medicaid Other | Admitting: Physical Therapy

## 2021-08-28 ENCOUNTER — Other Ambulatory Visit: Payer: Self-pay

## 2021-08-28 ENCOUNTER — Other Ambulatory Visit: Payer: Self-pay | Admitting: Orthopaedic Surgery

## 2021-08-28 ENCOUNTER — Encounter: Payer: Self-pay | Admitting: Physical Therapy

## 2021-08-28 DIAGNOSIS — M542 Cervicalgia: Secondary | ICD-10-CM

## 2021-08-28 DIAGNOSIS — M6281 Muscle weakness (generalized): Secondary | ICD-10-CM

## 2021-08-28 DIAGNOSIS — M5459 Other low back pain: Secondary | ICD-10-CM

## 2021-08-28 DIAGNOSIS — R293 Abnormal posture: Secondary | ICD-10-CM
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
## 2021-08-29 ENCOUNTER — Telehealth: Payer: Self-pay | Admitting: Orthopaedic Surgery

## 2021-08-29 ENCOUNTER — Other Ambulatory Visit (HOSPITAL_COMMUNITY): Payer: Self-pay

## 2021-08-29 ENCOUNTER — Inpatient Hospital Stay: Admission: RE | Admit: 2021-08-29 | Payer: Medicaid Other | Source: Ambulatory Visit

## 2021-08-29 MED ORDER — DIAZEPAM 5 MG PO TABS
ORAL_TABLET | ORAL | 0 refills | Status: DC
  Filled 2021-08-29: qty 2, 1d supply, fill #0

## 2021-08-29 MED ORDER — DIAZEPAM 5 MG PO TABS: ORAL_TABLET | ORAL | 0 refills | Status: DC

## 2021-08-29 NOTE — Telephone Encounter (Signed)
Pt called and got in with the mri today and she needs something to relax her for the mri.  ? ?Cb [phone]  ?

## 2021-08-29 NOTE — Telephone Encounter (Signed)
OK for valium #2 per Dr. Lorin Mercy. Called to [place] per patient request. Patient aware. ?

## 2021-08-29 NOTE — Addendum Note (Signed)
Addended by: Meyer Cory on: 08/29/2021 12:10 PM ? ? Modules accepted: Orders ? ?

## 2021-08-30 ENCOUNTER — Encounter (HOSPITAL_COMMUNITY): Payer: Self-pay | Admitting: Emergency Medicine

## 2021-08-30 ENCOUNTER — Ambulatory Visit (HOSPITAL_COMMUNITY)
Admission: EM | Admit: 2021-08-30 | Discharge: 2021-08-30 | Disposition: A | Discharge status: Home / Self Care | Payer: Medicaid Other | Attending: Physician Assistant | Admitting: Physician Assistant

## 2021-08-30 ENCOUNTER — Ambulatory Visit: Payer: Medicaid Other | Admitting: Physical Therapy

## 2021-08-30 ENCOUNTER — Ambulatory Visit
Admission: RE | Admit: 2021-08-30 | Discharge: 2021-08-30 | Disposition: A | Discharge status: Home / Self Care | Payer: Medicaid Other | Source: Ambulatory Visit | Attending: Orthopaedic Surgery | Admitting: Orthopaedic Surgery

## 2021-08-30 ENCOUNTER — Other Ambulatory Visit: Payer: Self-pay

## 2021-08-30 DIAGNOSIS — J45909 Unspecified asthma, uncomplicated: Secondary | ICD-10-CM

## 2021-08-30 DIAGNOSIS — J069 Acute upper respiratory infection, unspecified: Secondary | ICD-10-CM | POA: Diagnosis not present

## 2021-08-30 DIAGNOSIS — R051 Acute cough: Secondary | ICD-10-CM

## 2021-08-30 DIAGNOSIS — M542 Cervicalgia: Secondary | ICD-10-CM

## 2021-08-30 DIAGNOSIS — R0981 Nasal congestion: Secondary | ICD-10-CM

## 2021-08-30 MED ORDER — PROMETHAZINE-DM 6.25-15 MG/5ML PO SYRP
5.0000 mL | ORAL_SOLUTION | Freq: Two times a day (BID) | ORAL | 1 refills | Status: DC | PRN
Start: 2021-08-30 — End: 2021-10-05

## 2021-08-30 MED ORDER — PREDNISONE 10 MG PO TABS: 10.0000 mg | ORAL_TABLET | Freq: Two times a day (BID) | ORAL | 0 refills | Status: DC

## 2021-08-30 NOTE — ED Triage Notes (Signed)
4 days ago started having symptoms.  Patient has a history of seasonal allergies.  Reports runny nose, itchy, watery eyes.  Patient is taking claritin.   Patient also usually takes promethazine dm when allergies are this bad.

## 2021-08-30 NOTE — Discharge Instructions (Addendum)
Advised to continue using Flonase nasal spray, and taking Claritin daily.
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
## 2021-09-03 ENCOUNTER — Ambulatory Visit (INDEPENDENT_AMBULATORY_CARE_PROVIDER_SITE_OTHER): Payer: Medicaid Other | Admitting: Family Medicine

## 2021-09-03 ENCOUNTER — Encounter: Payer: Self-pay | Admitting: Family Medicine

## 2021-09-03 VITALS — BP 142/91 | Ht 64.0 in | Wt 206.0 lb

## 2021-09-03 DIAGNOSIS — M25562 Pain in left knee: Secondary | ICD-10-CM
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
## 2021-09-05 ENCOUNTER — Other Ambulatory Visit (HOSPITAL_COMMUNITY): Payer: Self-pay

## 2021-09-05 ENCOUNTER — Encounter: Payer: Self-pay | Admitting: Orthopaedic Surgery

## 2021-09-05 ENCOUNTER — Ambulatory Visit (INDEPENDENT_AMBULATORY_CARE_PROVIDER_SITE_OTHER): Payer: Medicaid Other | Admitting: Orthopaedic Surgery

## 2021-09-05 VITALS — BP 139/85 | Ht 64.0 in | Wt 206.0 lb

## 2021-09-05 DIAGNOSIS — M4722 Other spondylosis with radiculopathy, cervical region: Secondary | ICD-10-CM
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
## 2021-09-06 ENCOUNTER — Telehealth: Payer: Self-pay | Admitting: Orthopaedic Surgery

## 2021-09-06 ENCOUNTER — Telehealth: Payer: Self-pay

## 2021-09-06 NOTE — Telephone Encounter (Signed)
Patient left me a vm stating she was calling to set up an appointment. Looks like she has a referral from [facility] to [facility]. Thanks

## 2021-09-07 ENCOUNTER — Telehealth: Payer: Self-pay

## 2021-09-07 NOTE — Telephone Encounter (Signed)
[name]  Patient's medications have been couriered to [facility] from [facility] and will be picked up 09/11/21.  Ileene Patrick , [facility] Patient [facility] Phone: [phone] Fax:  [phone]

## 2021-09-11 ENCOUNTER — Encounter: Payer: Self-pay | Admitting: Physical Medicine and Rehabilitation

## 2021-09-11 ENCOUNTER — Ambulatory Visit (INDEPENDENT_AMBULATORY_CARE_PROVIDER_SITE_OTHER): Payer: Medicaid Other | Admitting: Physical Medicine and Rehabilitation

## 2021-09-11 VITALS — BP 148/74 | HR 82

## 2021-09-11 DIAGNOSIS — M47812 Spondylosis without myelopathy or radiculopathy, cervical region: Secondary | ICD-10-CM

## 2021-09-11 DIAGNOSIS — M542 Cervicalgia: Secondary | ICD-10-CM | POA: Diagnosis not present

## 2021-09-11 DIAGNOSIS — M5412 Radiculopathy, cervical region: Secondary | ICD-10-CM | POA: Diagnosis not present

## 2021-09-11 DIAGNOSIS — R202 Paresthesia of skin: Secondary | ICD-10-CM | POA: Diagnosis not present

## 2021-09-11 MED ORDER — DIAZEPAM 5 MG PO TABS: ORAL_TABLET | ORAL | 0 refills | Status: DC
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
## 2021-09-11 NOTE — Progress Notes (Unsigned)
Pt neck pain that travels down both shoulder mostly her right. Pt state any movement with her arm or neck makes the pain worse. Pt state she takes pain meds and uses heat at to help ease her pain. Pt state she had trigger point at  PT that made the pain worse.  Numeric Pain Rating Scale and Functional Assessment Average Pain 9 Pain Right Now 10 My pain is constant, sharp, burning, dull, stabbing, tingling, and aching Pain is worse with: walking, bending, sitting, standing, some activites, and laying down Pain improves with: heat/ice and medication   In the last MONTH (on 0-10 scale) has pain interfered with the following?  1. General activity like being  able to carry out your everyday physical activities such as walking, climbing stairs, carrying groceries, or moving a chair?  Rating(5)  2. Relation with others like being able to carry out your usual social activities and roles such as  activities at home, at work and in your community. Rating(6)  3. Enjoyment of life such that you have  been bothered by emotional problems such as feeling anxious, depressed or irritable?  Rating(7)

## 2021-09-19 ENCOUNTER — Ambulatory Visit: Payer: Medicaid Other | Attending: Family Medicine | Admitting: Physical Therapy
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
## 2021-09-25 ENCOUNTER — Other Ambulatory Visit (HOSPITAL_COMMUNITY): Payer: Self-pay

## 2021-09-25 ENCOUNTER — Other Ambulatory Visit: Payer: Self-pay | Admitting: Family

## 2021-09-25 NOTE — Telephone Encounter (Signed)
Need to confirm pharmacy with patient, MyChart message sent.   Beryle Flock, RN

## 2021-09-26 ENCOUNTER — Other Ambulatory Visit (HOSPITAL_COMMUNITY): Payer: Self-pay

## 2021-09-26 MED ORDER — BIKTARVY 50-200-25 MG PO TABS
1.0000 | ORAL_TABLET | Freq: Every day | ORAL | 4 refills | Status: DC
  Filled 2021-09-26: qty 30, 30d supply, fill #0
  Filled 2021-10-19: qty 30, 30d supply, fill #1
  Filled 2021-11-19: qty 30, 30d supply, fill #2
  Filled 2021-12-25: qty 30, 30d supply, fill #3
  Filled 2022-01-21 – 2022-02-04 (×2): qty 30, 30d supply, fill #4

## 2021-09-26 NOTE — Telephone Encounter (Signed)
Called and spoke with patient. Patient stated she picks up medication from [facility]; per [facility], refill sent to [facility].   Scheduled patient for follow up appointment tomorrow (6/15) with Marya Amsler.  Binnie Kand, RN

## 2021-09-26 NOTE — Assessment & Plan Note (Signed)
Lori Ford has well-controlled virus with good adherence and tolerance to Biktarvy.  Reviewed lab work and discussed plan of care continue current dose of Biktarvy.  Plan for follow-up in 3 months or sooner if needed with lab work 1 to 2 weeks prior to appointment.

## 2021-09-27 ENCOUNTER — Other Ambulatory Visit (HOSPITAL_COMMUNITY): Payer: Self-pay

## 2021-09-27 ENCOUNTER — Encounter: Payer: Self-pay | Admitting: Family

## 2021-09-27 ENCOUNTER — Other Ambulatory Visit: Payer: Self-pay

## 2021-09-27 ENCOUNTER — Ambulatory Visit (INDEPENDENT_AMBULATORY_CARE_PROVIDER_SITE_OTHER): Payer: Medicaid Other | Admitting: Family

## 2021-09-27 VITALS — BP 132/82 | HR 65 | Temp 97.9°F | Wt 219.0 lb

## 2021-09-27 DIAGNOSIS — Z5181 Encounter for therapeutic drug level monitoring: Secondary | ICD-10-CM | POA: Insufficient documentation

## 2021-09-27 DIAGNOSIS — Z0000 Encounter for general adult medical examination without abnormal findings: Secondary | ICD-10-CM | POA: Diagnosis not present

## 2021-09-27 DIAGNOSIS — B20 Human immunodeficiency virus [HIV] disease: Secondary | ICD-10-CM

## 2021-09-27 DIAGNOSIS — F339 Major depressive disorder, recurrent, unspecified: Secondary | ICD-10-CM

## 2021-09-27 NOTE — Assessment & Plan Note (Signed)
   Discussed importance of safe sexual practices and condom use.  Condoms offered.  Routine vaccinations up-to-date per recommendations.  She will schedule independent routine dental care and can refer to [facility] if needed.

## 2021-09-27 NOTE — Assessment & Plan Note (Signed)
Ms. Jarnagin continues to have well-controlled virus with good adherence and tolerance to Biktarvy.  We reviewed lab work and discussed plan of care.  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 Will continue with current dose of Biktarvy. Check lab work today. Follow up in 4 months or sooner if needed with lab work on the same day.
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
## 2021-09-27 NOTE — Assessment & Plan Note (Signed)
Renal function and hepatic function remain stable. Check CMET today. Continue to monitor at least q6 while on Biktarvy.

## 2021-09-27 NOTE — Patient Instructions (Addendum)
Nice to see you.  We will check your lab work today.  Continue to take your medication daily as prescribed.  Refills have been sent to the pharmacy.  Plan for follow up in 4 months or sooner if needed with lab work on the same day.  Have a great day and stay safe!  

## 2021-09-27 NOTE — Assessment & Plan Note (Signed)
Depression significantly improved and the happiness that she has been in a long time as a tremendous amount of stress has been relieved.  Continue to monitor.

## 2021-09-28 ENCOUNTER — Other Ambulatory Visit (HOSPITAL_COMMUNITY): Payer: Self-pay

## 2021-09-28 ENCOUNTER — Telehealth: Payer: Self-pay

## 2021-09-28 LAB — T-HELPER CELL (CD4) - (RCID CLINIC ONLY)
CD4 % Helper T Cell: 32 % — ABNORMAL LOW (ref 33–65)
CD4 T Cell Abs: 711 /uL (ref 400–1790)

## 2021-09-28 NOTE — Telephone Encounter (Signed)
[name]  Patient's medications have been couriered to [facility] from [facility] and will be picked up .  Ileene Patrick , [facility] Patient [facility] for Infectious Disease Phone: [phone] Fax:  [phone]

## 2021-10-02 LAB — COMPREHENSIVE METABOLIC PANEL
AG Ratio: 1.5 (calc) (ref 1.0–2.5)
ALT: 9 U/L (ref 6–29)
AST: 11 U/L (ref 10–30)
Albumin: 4.1 g/dL (ref 3.6–5.1)
Alkaline phosphatase (APISO): 68 U/L (ref 31–125)
BUN: 10 mg/dL (ref 7–25)
CO2: 22 mmol/L (ref 20–32)
Calcium: 9.2 mg/dL (ref 8.6–10.2)
Chloride: 105 mmol/L (ref 98–110)
Creat: 0.98 mg/dL (ref 0.50–0.99)
Globulin: 2.8 g/dL (calc) (ref 1.9–3.7)
Glucose, Bld: 91 mg/dL (ref 65–99)
Potassium: 4.1 mmol/L (ref 3.5–5.3)
Sodium: 138 mmol/L (ref 135–146)
Total Bilirubin: 0.4 mg/dL (ref 0.2–1.2)
Total Protein: 6.9 g/dL (ref 6.1–8.1)

## 2021-10-02 LAB — HIV-1 RNA QUANT-NO REFLEX-BLD
HIV 1 RNA Quant: <20 Copies/mL — ABNORMAL HIGH
HIV-1 RNA Quant, Log: <1.3 Log cps/mL — ABNORMAL HIGH

## 2021-10-05 ENCOUNTER — Encounter (HOSPITAL_COMMUNITY): Payer: Self-pay | Admitting: Emergency Medicine

## 2021-10-05 ENCOUNTER — Ambulatory Visit (HOSPITAL_COMMUNITY)
Admission: EM | Admit: 2021-10-05 | Discharge: 2021-10-05 | Disposition: A | Discharge status: Home / Self Care | Payer: Medicaid Other | Attending: Internal Medicine | Admitting: Internal Medicine

## 2021-10-05 DIAGNOSIS — R0981 Nasal congestion: Secondary | ICD-10-CM

## 2021-10-05 DIAGNOSIS — R051 Acute cough: Secondary | ICD-10-CM

## 2021-10-05 DIAGNOSIS — J0100 Acute maxillary sinusitis, unspecified: Secondary | ICD-10-CM

## 2021-10-05 MED ORDER — PROMETHAZINE-DM 6.25-15 MG/5ML PO SYRP: 5.0000 mL | ORAL_SOLUTION | Freq: Four times a day (QID) | ORAL | 0 refills | Status: DC | PRN

## 2021-10-05 MED ORDER — GUAIFENESIN ER 1200 MG PO TB12: 1200.0000 mg | ORAL_TABLET | Freq: Two times a day (BID) | ORAL | 0 refills | Status: DC

## 2021-10-05 MED ORDER — ACETAMINOPHEN 325 MG PO TABS
ORAL_TABLET | ORAL | Status: AC
  Filled 2021-10-05: qty 3

## 2021-10-05 MED ORDER — ALBUTEROL SULFATE (2.5 MG/3ML) 0.083% IN NEBU: 2.5000 mg | INHALATION_SOLUTION | RESPIRATORY_TRACT | 0 refills | Status: DC | PRN

## 2021-10-05 MED ORDER — MISC. DEVICES MISC
0 refills | Status: DC
Start: 2021-10-05 — End: 2023-01-09

## 2021-10-05 MED ORDER — MISC. DEVICES MISC
0 refills | Status: DC
Start: 2021-10-05 — End: 2021-10-05

## 2021-10-05 MED ORDER — ACETAMINOPHEN 500 MG PO TABS: 1000.0000 mg | ORAL_TABLET | Freq: Four times a day (QID) | ORAL | 0 refills | Status: DC | PRN

## 2021-10-05 MED ORDER — ACETAMINOPHEN 325 MG PO TABS
975.0000 mg | ORAL_TABLET | Freq: Once | ORAL | Status: AC
  Administered 2021-10-05: 975 mg via ORAL

## 2021-10-09 ENCOUNTER — Encounter: Payer: Medicaid Other | Admitting: Physical Medicine and Rehabilitation

## 2021-10-09 ENCOUNTER — Telehealth: Payer: Self-pay | Admitting: Physical Medicine and Rehabilitation

## 2021-10-10 ENCOUNTER — Ambulatory Visit: Payer: Self-pay

## 2021-10-10 ENCOUNTER — Encounter (HOSPITAL_COMMUNITY): Payer: Self-pay | Admitting: Emergency Medicine

## 2021-10-10 ENCOUNTER — Encounter: Payer: Self-pay | Admitting: Physical Medicine and Rehabilitation

## 2021-10-10 ENCOUNTER — Ambulatory Visit (INDEPENDENT_AMBULATORY_CARE_PROVIDER_SITE_OTHER): Payer: Medicaid Other | Admitting: Physical Medicine and Rehabilitation

## 2021-10-10 ENCOUNTER — Ambulatory Visit (HOSPITAL_COMMUNITY)
Admission: EM | Admit: 2021-10-10 | Discharge: 2021-10-10 | Disposition: A | Discharge status: Home / Self Care | Payer: Medicaid Other | Attending: Physician Assistant | Admitting: Physician Assistant

## 2021-10-10 VITALS — BP 155/86 | HR 74

## 2021-10-10 DIAGNOSIS — J0110 Acute frontal sinusitis, unspecified: Secondary | ICD-10-CM | POA: Diagnosis not present

## 2021-10-10 DIAGNOSIS — M5412 Radiculopathy, cervical region: Secondary | ICD-10-CM | POA: Diagnosis not present

## 2021-10-10 DIAGNOSIS — R509 Fever, unspecified: Secondary | ICD-10-CM

## 2021-10-10 DIAGNOSIS — G4489 Other headache syndrome: Secondary | ICD-10-CM

## 2021-10-10 MED ORDER — SULFAMETHOXAZOLE-TRIMETHOPRIM 800-160 MG PO TABS: 1.0000 | ORAL_TABLET | Freq: Two times a day (BID) | ORAL | 0 refills | Status: AC

## 2021-10-10 MED ORDER — FLUTICASONE PROPIONATE 50 MCG/ACT NA SUSP: 2.0000 | Freq: Every day | NASAL | 2 refills | Status: DC

## 2021-10-10 MED ORDER — METHYLPREDNISOLONE ACETATE 80 MG/ML IJ SUSP
80.0000 mg | Freq: Once | INTRAMUSCULAR | Status: AC
  Administered 2021-10-10: 80 mg

## 2021-10-10 MED ORDER — PROMETHAZINE-DM 6.25-15 MG/5ML PO SYRP: 5.0000 mL | ORAL_SOLUTION | Freq: Four times a day (QID) | ORAL | 0 refills | Status: DC | PRN
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
## 2021-10-10 NOTE — Progress Notes (Signed)
Pt neck pain that travels down right shoulder and down her back. Pt state any movement with her arm or neck makes the pain worse. Pt state she takes pain meds and uses heat at to help ease her pain.  Numeric Pain Rating Scale and Functional Assessment Average Pain 8   In the last MONTH (on 0-10 scale) has pain interfered with the following?  1. General activity like being  able to carry out your everyday physical activities such as walking, climbing stairs, carrying groceries, or moving a chair?  Rating(10)   +Driver, -BT, -Dye Allergies.

## 2021-10-10 NOTE — Patient Instructions (Signed)

## 2021-10-10 NOTE — ED Triage Notes (Signed)
Patient c/o sinus pressure and pain, productive cough, nasal drainage for several days.  Recently seen and no better.  Patient has completed medication given to her.

## 2021-10-10 NOTE — Discharge Instructions (Addendum)
Advised to take the Phenergan DM 1 to 2 teaspoons every 6-8 hours as needed for congestion and cough. Advised to use of Flonase nasal spray 2 sprays each nostril once a day to help with the sinus congestion. Advised to take the Bactrim DS 1 every 12 hours until completed to help treat sinus infection. Advised to follow-up PCP or return to urgent care if symptoms fail to improve.

## 2021-10-19 ENCOUNTER — Other Ambulatory Visit (HOSPITAL_COMMUNITY): Payer: Self-pay

## 2021-10-22 ENCOUNTER — Telehealth: Payer: Self-pay

## 2021-10-22 ENCOUNTER — Other Ambulatory Visit (HOSPITAL_COMMUNITY): Payer: Self-pay

## 2021-10-22 NOTE — Telephone Encounter (Signed)
[name]  Patient's medications have been couriered to [facility] from [facility] and will be picked up 10/24/21.  Lori Ford , [facility] Patient [facility] Phone: [phone] Fax:  [phone]

## 2021-10-23 ENCOUNTER — Other Ambulatory Visit: Payer: Self-pay | Admitting: Internal Medicine

## 2021-10-24 ENCOUNTER — Emergency Department (HOSPITAL_COMMUNITY): Payer: Medicaid Other

## 2021-10-24 ENCOUNTER — Emergency Department (HOSPITAL_COMMUNITY)
Admission: EM | Admit: 2021-10-24 | Discharge: 2021-10-25 | Discharge status: Against Medical Advice | Payer: Medicaid Other | Attending: Emergency Medicine | Admitting: Emergency Medicine

## 2021-10-24 ENCOUNTER — Other Ambulatory Visit: Payer: Self-pay

## 2021-10-24 ENCOUNTER — Encounter (HOSPITAL_COMMUNITY): Payer: Self-pay | Admitting: Emergency Medicine

## 2021-10-24 DIAGNOSIS — M25552 Pain in left hip: Secondary | ICD-10-CM | POA: Diagnosis present

## 2021-10-24 DIAGNOSIS — Z5321 Procedure and treatment not carried out due to patient leaving prior to being seen by health care provider: Secondary | ICD-10-CM | POA: Diagnosis not present

## 2021-10-24 DIAGNOSIS — M25561 Pain in right knee: Secondary | ICD-10-CM | POA: Insufficient documentation

## 2021-10-24 DIAGNOSIS — R55 Syncope and collapse: Secondary | ICD-10-CM | POA: Diagnosis not present

## 2021-10-24 DIAGNOSIS — Y92410 Unspecified street and highway as the place of occurrence of the external cause: Secondary | ICD-10-CM | POA: Insufficient documentation

## 2021-10-24 LAB — COMPLETE METABOLIC PANEL WITH GFR
AG Ratio: 1.6 (calc) (ref 1.0–2.5)
ALT: 7 U/L (ref 6–29)
AST: 10 U/L (ref 10–30)
Albumin: 4.3 g/dL (ref 3.6–5.1)
Alkaline phosphatase (APISO): 63 U/L (ref 31–125)
BUN: 9 mg/dL (ref 7–25)
CO2: 23 mmol/L (ref 20–32)
Calcium: 9.2 mg/dL (ref 8.6–10.2)
Chloride: 105 mmol/L (ref 98–110)
Creat: 0.89 mg/dL (ref 0.50–0.99)
Globulin: 2.7 g/dL (calc) (ref 1.9–3.7)
Glucose, Bld: 97 mg/dL (ref 65–99)
Potassium: 4.1 mmol/L (ref 3.5–5.3)
Sodium: 137 mmol/L (ref 135–146)
Total Bilirubin: 0.5 mg/dL (ref 0.2–1.2)
Total Protein: 7 g/dL (ref 6.1–8.1)
eGFR: 82 mL/min/{1.73_m2} (ref >=60)

## 2021-10-24 LAB — VITAMIN D 25 HYDROXY (VIT D DEFICIENCY, FRACTURES): Vit D, 25-Hydroxy: 39 ng/mL (ref 30–100)

## 2021-10-24 LAB — CBC
HCT: 38.6 % (ref 35.0–45.0)
Hemoglobin: 12.9 g/dL (ref 11.7–15.5)
MCH: 31.2 pg (ref 27.0–33.0)
MCHC: 33.4 g/dL (ref 32.0–36.0)
MCV: 93.2 fL (ref 80.0–100.0)
MPV: 11.1 fL (ref 7.5–12.5)
Platelets: 271 10*3/uL (ref 140–400)
RBC: 4.14 10*6/uL (ref 3.80–5.10)
RDW: 14.1 % (ref 11.0–15.0)
WBC: 5.6 10*3/uL (ref 3.8–10.8)

## 2021-10-24 LAB — TSH: TSH: 0.99 mIU/L
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
## 2021-10-25 ENCOUNTER — Encounter (HOSPITAL_COMMUNITY): Payer: Self-pay

## 2021-10-25 ENCOUNTER — Ambulatory Visit (HOSPITAL_COMMUNITY)
Admission: EM | Admit: 2021-10-25 | Discharge: 2021-10-25 | Disposition: A | Discharge status: Home / Self Care | Payer: Medicaid Other | Attending: Physician Assistant | Admitting: Physician Assistant

## 2021-10-25 VITALS — BP 141/87 | HR 84 | Temp 98.8°F | Resp 18

## 2021-10-25 DIAGNOSIS — M25561 Pain in right knee: Secondary | ICD-10-CM

## 2021-10-25 DIAGNOSIS — S83411A Sprain of medial collateral ligament of right knee, initial encounter: Secondary | ICD-10-CM

## 2021-10-25 DIAGNOSIS — S83421A Sprain of lateral collateral ligament of right knee, initial encounter: Secondary | ICD-10-CM

## 2021-10-25 DIAGNOSIS — S83511A Sprain of anterior cruciate ligament of right knee, initial encounter: Secondary | ICD-10-CM

## 2021-10-25 DIAGNOSIS — S8001XA Contusion of right knee, initial encounter: Secondary | ICD-10-CM
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
## 2021-10-25 NOTE — Discharge Instructions (Signed)
I have made a referral to Ortho care here in [facility] for you to be seen for the right knee injury with instability.  The office should be calling you within the next 24 to 48 hours to set up an appointment. Advised to be careful using the crutches when wearing the knee immobilizer it could make you little unstable so use with caution. Advised to take the Tylenol on a regular basis as needed for pain relief.
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
## 2021-10-26 ENCOUNTER — Encounter: Payer: Self-pay | Admitting: Surgery

## 2021-10-26 ENCOUNTER — Ambulatory Visit (INDEPENDENT_AMBULATORY_CARE_PROVIDER_SITE_OTHER): Payer: Medicaid Other | Admitting: Surgery

## 2021-10-26 ENCOUNTER — Ambulatory Visit: Payer: Self-pay

## 2021-10-26 VITALS — BP 135/82 | HR 65 | Ht 64.0 in | Wt 212.0 lb

## 2021-10-26 DIAGNOSIS — M25561 Pain in right knee: Secondary | ICD-10-CM | POA: Diagnosis not present

## 2021-10-26 DIAGNOSIS — M25361 Other instability, right knee: Secondary | ICD-10-CM | POA: Diagnosis not present

## 2021-10-26 DIAGNOSIS — W19XXXA Unspecified fall, initial encounter: Secondary | ICD-10-CM | POA: Diagnosis not present

## 2021-10-28 ENCOUNTER — Ambulatory Visit
Admission: RE | Admit: 2021-10-28 | Discharge: 2021-10-28 | Disposition: A | Discharge status: Home / Self Care | Payer: Medicaid Other | Source: Ambulatory Visit | Attending: Surgery | Admitting: Surgery

## 2021-10-28 DIAGNOSIS — W19XXXA Unspecified fall, initial encounter: Secondary | ICD-10-CM

## 2021-10-28 DIAGNOSIS — M25561 Pain in right knee: Secondary | ICD-10-CM

## 2021-10-28 DIAGNOSIS — M25361 Other instability, right knee: Secondary | ICD-10-CM

## 2021-10-31 ENCOUNTER — Encounter: Payer: Self-pay | Admitting: Surgery

## 2021-10-31 ENCOUNTER — Ambulatory Visit (INDEPENDENT_AMBULATORY_CARE_PROVIDER_SITE_OTHER): Payer: Medicaid Other | Admitting: Surgery

## 2021-10-31 VITALS — BP 141/93 | HR 70 | Ht 64.0 in | Wt 212.0 lb

## 2021-10-31 DIAGNOSIS — S83411A Sprain of medial collateral ligament of right knee, initial encounter: Secondary | ICD-10-CM

## 2021-10-31 DIAGNOSIS — S83511A Sprain of anterior cruciate ligament of right knee, initial encounter: Secondary | ICD-10-CM

## 2021-10-31 DIAGNOSIS — S83281A Other tear of lateral meniscus, current injury, right knee, initial encounter: Secondary | ICD-10-CM | POA: Diagnosis not present

## 2021-10-31 MED ORDER — ENOXAPARIN SODIUM 40 MG/0.4ML IJ SOSY: 40.0000 mg | PREFILLED_SYRINGE | INTRAMUSCULAR | 0 refills | Status: DC
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
## 2021-11-05 ENCOUNTER — Telehealth: Payer: Self-pay | Admitting: Surgery

## 2021-11-05 NOTE — Telephone Encounter (Signed)
Pt called requesting a call back. Pt need a new right leg brace. She states it is really bothering her skin and metal piece poking her in the leg. Pt phone number is [phone].

## 2021-11-05 NOTE — Telephone Encounter (Signed)
Error. [employer] for [employer] Please see last message

## 2021-11-07 ENCOUNTER — Ambulatory Visit: Payer: Medicaid Other | Admitting: Physical Therapy

## 2021-11-07 NOTE — Telephone Encounter (Signed)
Patient wants a new knee immobolizer, she will come by at some point and get another

## 2021-11-12 NOTE — Progress Notes (Signed)
Lori Ford - 46 y.o. female MRN [id number]  Date of birth: 03/23/1976  Office Visit Note: Visit Date: 10/10/2021 PCP: Nolene Ebbs, MD Referred by: Nolene Ebbs, MD  Subjective: Chief Complaint  Patient presents with   Neck - Pain   Right Shoulder - Pain   HPI:  Lori Ford is a 46 y.o. female who comes in today at the request of Lori Pall, FNP for planned Right C7-T1 Cervical Interlaminar epidural steroid injection with fluoroscopic guidance.  The patient has failed conservative care including home exercise, medications, time and activity modification.  This injection will be diagnostic and hopefully therapeutic.  Please see requesting physician notes for further details and justification.   ROS Otherwise per HPI.  Assessment & Plan: Visit Diagnoses:    ICD-10-CM   1. Radiculopathy, cervical region  M54.12 XR C-ARM NO REPORT    Epidural Steroid injection    methylPREDNISolone acetate (DEPO-MEDROL) injection 80 mg      Plan: No additional findings.   Meds & Orders:  Meds ordered this encounter  Medications   methylPREDNISolone acetate (DEPO-MEDROL) injection 80 mg    Orders Placed This Encounter  Procedures   XR C-ARM NO REPORT   Epidural Steroid injection    Follow-up: Return for visit to requesting provider as needed.   Procedures: No procedures performed  Cervical Epidural Steroid Injection - Interlaminar Approach with Fluoroscopic Guidance  Patient: Lori Ford      Date of Birth: November 21, 1975 MRN: [id number] PCP: Nolene Ebbs, MD      Visit Date: 10/10/2021   Universal Protocol:    Date/Time: 11/12/2308:44 PM  Consent Given By: the patient  Position: PRONE  Additional Comments: Vital signs were monitored before and after the procedure. Patient was prepped and draped in the usual sterile fashion. The correct patient, procedure, and site was verified.   Injection Procedure Details:   Procedure diagnoses: Radiculopathy, cervical  region [M54.12]    Meds Administered:  Meds ordered this encounter  Medications   methylPREDNISolone acetate (DEPO-MEDROL) injection 80 mg     Laterality: Right  Location/Site: C7-T1  Needle: 3.5 in., 20 ga. Tuohy  Needle Placement: Paramedian epidural space  Findings:  -Comments: Excellent flow of contrast into the epidural space.  Procedure Details: Using a paramedian approach from the side mentioned above, the region overlying the inferior lamina was localized under fluoroscopic visualization and the soft tissues overlying this structure were infiltrated with 4 ml. of 1% Lidocaine without Epinephrine. A # 20 gauge, Tuohy needle was inserted into the epidural space using a paramedian approach.  The epidural space was localized using loss of resistance along with contralateral oblique bi-planar fluoroscopic views.  After negative aspirate for air, blood, and CSF, a 2 ml. volume of Isovue-250 was injected into the epidural space and the flow of contrast was observed. Radiographs were obtained for documentation purposes.   The injectate was administered into the level noted above.  Additional Comments:  The patient tolerated the procedure well Dressing: 2 x 2 sterile gauze and Band-Aid    Post-procedure details: Patient was observed during the procedure. Post-procedure instructions were reviewed.  Patient left the clinic in stable condition.   Clinical History: MRI CERVICAL SPINE WITHOUT CONTRAST   TECHNIQUE: Multiplanar, multisequence MR imaging of the cervical spine was performed. No intravenous contrast was administered.   COMPARISON:  Radiographs of the cervical spine 07/03/2021. Cervical spine CT 09/21/2020   FINDINGS: Currently motion degraded examination, limiting evaluation. Most notably, the sagittal T2 TSE sequence is  moderate to severely motion degraded and the axial T2 TSE sequence is moderately motion degraded.   Alignment: Straightening of the  expected cervical lordosis. No significant spondylolisthesis.   Vertebrae: Vertebral body height is maintained. Multilevel degenerate endplate irregularity, greatest at C4-C5. Moderate degenerative endplate edema at V4-Q5 and C5-C6.   Cord: No signal abnormality identified within the cervical spinal cord.   Posterior Fossa, vertebral arteries, paraspinal tissues: Partially empty sella turcica. Flow voids preserved within the imaged cervical vertebral arteries. Paraspinal soft tissues unremarkable.   Disc levels:   Multilevel disc degeneration, most notably as follows. Moderate to moderately advanced advanced disc degeneration at C4-C5. Moderate disc degeneration at C3-C4, C5-C6 and C6-C7.   C2-C3: Mild facet arthrosis. No significant disc herniation or stenosis.   C3-C4: Disc bulge. Uncovertebral hypertrophy on the right. No significant spinal canal stenosis. Mild relative right neural foraminal narrowing.   C4-C5: Posterior disc osteophyte complex. Right-sided disc osteophyte ridge/uncinate hypertrophy. Mild uncovertebral hypertrophy on the left. The posterior disc osteophyte complex effaces the ventral thecal sac, contacting and minimally flattening the ventral aspect of the spinal cord. However, the dorsal CSF space is maintained within the spinal canal. Mild to moderate right neural foraminal narrowing.   C5-C6: Shallow disc bulge with right greater than left uncovertebral hypertrophy. No significant spinal canal stenosis. Mild to moderate right neural foraminal narrowing   C6-C7: Shallow disc bulge with mild bilateral uncovertebral hypertrophy. No significant spinal canal stenosis or neural foraminal narrowing.   C7-T1: Mild facet arthrosis. No significant disc herniation or stenosis.   IMPRESSION: Motion degraded examination, as described and limiting evaluation.   Cervical spondylosis, as outlined with findings most notably as follows.   At C4-C5, there is  moderate to moderately advanced disc degeneration with moderate degenerative endplate edema. A posterior disc osteophyte complex effaces the ventral thecal sac, contacting and minimally flattening the ventral aspect of the spinal cord. However, the dorsal CSF space is maintained within the spinal canal. Multifactorial mild to moderate right neural foraminal narrowing also present at this level.   No significant spinal canal stenosis at the remaining levels. Additional sites of foraminal stenosis, as detailed and greatest on the right at C5-C6 (mild-to-moderate at this site).   Moderate degenerative endplate edema at Z5-G3.   Nonspecific straightening of the expected cervical lordosis.   Partially empty sella turcica. While this finding often reflects incidental anatomic variation, it can also be associated with idiopathic intracranial hypertension (pseudotumor cerebri).     Electronically Signed   By: Kellie Simmering D.O.   On: 08/30/2021 17:56     Objective:  VS:  HT:    WT:   BMI:     BP:(!) 155/86  HR:74bpm  TEMP: ( )  RESP:  Physical Exam Vitals and nursing note reviewed.  Constitutional:      General: She is not in acute distress.    Appearance: Normal appearance. She is not ill-appearing.  HENT:     Head: Normocephalic and atraumatic.     Right Ear: External ear normal.     Left Ear: External ear normal.  Eyes:     Extraocular Movements: Extraocular movements intact.  Cardiovascular:     Rate and Rhythm: Normal rate.     Pulses: Normal pulses.  Musculoskeletal:     Cervical back: Tenderness present. No rigidity.     Right lower leg: No edema.     Left lower leg: No edema.     Comments: Patient has good strength in the  upper extremities including 5 out of 5 strength in wrist extension long finger flexion and APB.  There is no atrophy of the hands intrinsically.  There is a negative Hoffmann's test.   Lymphadenopathy:     Cervical: No cervical adenopathy.   Skin:    Findings: No erythema, lesion or rash.  Neurological:     General: No focal deficit present.     Mental Status: She is alert and oriented to person, place, and time.     Sensory: No sensory deficit.     Motor: No weakness or abnormal muscle tone.     Coordination: Coordination normal.  Psychiatric:        Mood and Affect: Mood normal.        Behavior: Behavior normal.      Imaging: No results found.

## 2021-11-12 NOTE — Procedures (Signed)
Cervical Epidural Steroid Injection - Interlaminar Approach with Fluoroscopic Guidance  Patient: Lori Ford      Date of Birth: 1975/08/01 MRN: [id number] PCP: Nolene Ebbs, MD      Visit Date: 10/10/2021   Universal Protocol:    Date/Time: 11/12/2308:44 PM  Consent Given By: the patient  Position: PRONE  Additional Comments: Vital signs were monitored before and after the procedure. Patient was prepped and draped in the usual sterile fashion. The correct patient, procedure, and site was verified.   Injection Procedure Details:   Procedure diagnoses: Radiculopathy, cervical region [M54.12]    Meds Administered:  Meds ordered this encounter  Medications   methylPREDNISolone acetate (DEPO-MEDROL) injection 80 mg     Laterality: Right  Location/Site: C7-T1  Needle: 3.5 in., 20 ga. Tuohy  Needle Placement: Paramedian epidural space  Findings:  -Comments: Excellent flow of contrast into the epidural space.  Procedure Details: Using a paramedian approach from the side mentioned above, the region overlying the inferior lamina was localized under fluoroscopic visualization and the soft tissues overlying this structure were infiltrated with 4 ml. of 1% Lidocaine without Epinephrine. A # 20 gauge, Tuohy needle was inserted into the epidural space using a paramedian approach.  The epidural space was localized using loss of resistance along with contralateral oblique bi-planar fluoroscopic views.  After negative aspirate for air, blood, and CSF, a 2 ml. volume of Isovue-250 was injected into the epidural space and the flow of contrast was observed. Radiographs were obtained for documentation purposes.   The injectate was administered into the level noted above.  Additional Comments:  The patient tolerated the procedure well Dressing: 2 x 2 sterile gauze and Band-Aid    Post-procedure details: Patient was observed during the procedure. Post-procedure instructions were  reviewed.  Patient left the clinic in stable condition.

## 2021-11-13 ENCOUNTER — Ambulatory Visit: Payer: Medicaid Other | Admitting: Orthopaedic Surgery

## 2021-11-14 ENCOUNTER — Ambulatory Visit: Payer: Medicaid Other | Admitting: Physical Therapy

## 2021-11-19 ENCOUNTER — Other Ambulatory Visit (HOSPITAL_COMMUNITY): Payer: Self-pay

## 2021-11-19 ENCOUNTER — Telehealth: Payer: Self-pay | Admitting: Radiology

## 2021-11-19 NOTE — Telephone Encounter (Signed)
Talked with patient and advised her of message below, per Benjiman Core.

## 2021-11-19 NOTE — Telephone Encounter (Signed)
Patient called the triage line, states that she has 2 things 1- She states that she was rx'd Lovenox for 1 month by Jeneen Rinks, but her pharmacy only dispensed 2 week supply and she is out of it now. 2-She states that per Jeneen Rinks he wanted her to move the knee, which she is able to do. She states that Dr. Marlou Sa is supposed to be the one that would be doing surgery on her but wants to know if she should have it or not.  Please call patient and advise, call back [phone]

## 2021-11-19 NOTE — Telephone Encounter (Signed)
Please advise on message below. Thank you!

## 2021-11-20 ENCOUNTER — Telehealth: Payer: Self-pay

## 2021-11-20 NOTE — Telephone Encounter (Signed)
[name]  Patient's medication Systems developer) have been couriered to [facility] from [employer] and will be picked up 11/21/21.  Ileene Patrick , [facility] Patient [facility] for [facility]ious [facility] Phone: [phone] Fax:  [phone]

## 2021-11-21 ENCOUNTER — Ambulatory Visit (INDEPENDENT_AMBULATORY_CARE_PROVIDER_SITE_OTHER): Payer: Medicaid Other | Admitting: Orthopedic Surgery

## 2021-11-21 DIAGNOSIS — S83511A Sprain of anterior cruciate ligament of right knee, initial encounter: Secondary | ICD-10-CM | POA: Diagnosis not present

## 2021-11-23 ENCOUNTER — Encounter: Payer: Self-pay | Admitting: Orthopedic Surgery
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
## 2021-11-27 ENCOUNTER — Ambulatory Visit: Payer: Medicaid Other | Admitting: Orthopaedic Surgery

## 2021-11-28 ENCOUNTER — Ambulatory Visit: Payer: Medicaid Other | Admitting: Surgery

## 2021-11-30 ENCOUNTER — Telehealth: Payer: Self-pay | Admitting: Orthopedic Surgery

## 2021-11-30 NOTE — Telephone Encounter (Signed)
Called patient to let her know Leveda Anna with [facility] needed to speak with her prior to knee surgery scheduled for Monday August 21st, 2023 with Dr. Marlou Sa.  Patient said she needs to cancel surgery and reschedule for another time because she is under a lot of stress right now and she has had a recent seizure leaving her tired and sore. Patient also states she would like to use the tissue from her own body, rather than another person's body. Surgery order will need to be changed from allograft to autograft.  Patient will now need clearance due to the recent seizure.

## 2021-12-03 ENCOUNTER — Ambulatory Visit (HOSPITAL_BASED_OUTPATIENT_CLINIC_OR_DEPARTMENT_OTHER): Admission: RE | Admit: 2021-12-03 | Payer: Medicaid Other | Source: Home / Self Care | Admitting: Orthopedic Surgery

## 2021-12-03 ENCOUNTER — Encounter (HOSPITAL_BASED_OUTPATIENT_CLINIC_OR_DEPARTMENT_OTHER): Admission: RE | Payer: Self-pay | Source: Home / Self Care

## 2021-12-03 DIAGNOSIS — Z01818 Encounter for other preprocedural examination: Secondary | ICD-10-CM

## 2021-12-03 SURGERY — RECONSTRUCTION, KNEE, ACL, USING HAMSTRING GRAFT
Anesthesia: General | Site: Knee | Laterality: Right

## 2021-12-10 ENCOUNTER — Encounter: Payer: Medicaid Other | Admitting: Orthopedic Surgery

## 2021-12-11 ENCOUNTER — Encounter (HOSPITAL_COMMUNITY): Payer: Self-pay

## 2021-12-11 ENCOUNTER — Ambulatory Visit (HOSPITAL_COMMUNITY)
Admission: EM | Admit: 2021-12-11 | Discharge: 2021-12-11 | Disposition: A | Discharge status: Home / Self Care | Payer: Medicaid Other | Attending: Emergency Medicine | Admitting: Emergency Medicine

## 2021-12-11 DIAGNOSIS — Z760 Encounter for issue of repeat prescription: Secondary | ICD-10-CM | POA: Diagnosis not present

## 2021-12-11 DIAGNOSIS — M62838 Other muscle spasm: Secondary | ICD-10-CM | POA: Diagnosis not present

## 2021-12-11 DIAGNOSIS — I10 Essential (primary) hypertension: Secondary | ICD-10-CM

## 2021-12-11 DIAGNOSIS — J449 Chronic obstructive pulmonary disease, unspecified: Secondary | ICD-10-CM

## 2021-12-11 DIAGNOSIS — G4700 Insomnia, unspecified: Secondary | ICD-10-CM | POA: Diagnosis not present

## 2021-12-11 MED ORDER — ACCU-CHEK SOFT TOUCH LANCETS MISC: 3 refills | Status: DC

## 2021-12-11 MED ORDER — JANUVIA 100 MG PO TABS: 100.0000 mg | ORAL_TABLET | Freq: Every day | ORAL | 1 refills | Status: DC

## 2021-12-11 MED ORDER — PEN NEEDLES 32G X 4 MM MISC: 1.0000 | Freq: Every day | 1 refills | Status: AC

## 2021-12-11 MED ORDER — BACLOFEN 5 MG PO TABS: 1.0000 | ORAL_TABLET | Freq: Every day | ORAL | 0 refills | Status: AC

## 2021-12-11 MED ORDER — LIRAGLUTIDE 18 MG/3ML ~~LOC~~ SOPN
PEN_INJECTOR | SUBCUTANEOUS | 0 refills | Status: DC
Start: 2021-12-11 — End: 2022-04-12

## 2021-12-11 MED ORDER — ACCU-CHEK AVIVA PLUS VI STRP
ORAL_STRIP | 12 refills | Status: DC
Start: 2021-12-11 — End: 2021-12-12

## 2021-12-11 MED ORDER — FAMOTIDINE 40 MG PO TABS: 40.0000 mg | ORAL_TABLET | Freq: Every day | ORAL | 1 refills | Status: AC

## 2021-12-11 MED ORDER — BUDESONIDE-FORMOTEROL FUMARATE 160-4.5 MCG/ACT IN AERO: 2.0000 | INHALATION_SPRAY | Freq: Two times a day (BID) | RESPIRATORY_TRACT | 1 refills | Status: DC

## 2021-12-11 MED ORDER — INSULIN GLARGINE SOLOSTAR 100 UNIT/ML ~~LOC~~ SOPN: 10.0000 [IU] | PEN_INJECTOR | Freq: Every day | SUBCUTANEOUS | 1 refills | Status: AC

## 2021-12-11 MED ORDER — MONTELUKAST SODIUM 10 MG PO TABS: 10.0000 mg | ORAL_TABLET | Freq: Every day | ORAL | 1 refills | Status: AC

## 2021-12-11 MED ORDER — HYDROCHLOROTHIAZIDE 25 MG PO TABS: ORAL_TABLET | ORAL | 1 refills | Status: DC

## 2021-12-11 MED ORDER — OMEPRAZOLE 40 MG PO CPDR: 40.0000 mg | DELAYED_RELEASE_CAPSULE | Freq: Every day | ORAL | 1 refills | Status: AC

## 2021-12-11 MED ORDER — PROAIR HFA 108 (90 BASE) MCG/ACT IN AERS: 2.0000 | INHALATION_SPRAY | RESPIRATORY_TRACT | 5 refills | Status: AC | PRN

## 2021-12-11 MED ORDER — ATORVASTATIN CALCIUM 20 MG PO TABS: 20.0000 mg | ORAL_TABLET | Freq: Every day | ORAL | 1 refills | Status: AC

## 2021-12-11 MED ORDER — INSULIN GLARGINE 100 UNITS/ML SOLOSTAR PEN
10.0000 [IU] | PEN_INJECTOR | Freq: Every day | SUBCUTANEOUS | 1 refills | Status: DC
Start: 2021-12-11 — End: 2021-12-11

## 2021-12-11 MED ORDER — MIRTAZAPINE 15 MG PO TABS: 15.0000 mg | ORAL_TABLET | Freq: Every day | ORAL | 1 refills | Status: AC

## 2021-12-11 MED ORDER — INSULIN GLARGINE 100 UNITS/ML SOLOSTAR PEN: 10.0000 [IU] | PEN_INJECTOR | Freq: Every day | SUBCUTANEOUS | 1 refills | Status: DC

## 2021-12-11 NOTE — ED Triage Notes (Signed)
Pt states she needs a refill on some of her medications.  Her doctor no longer works at the practice anymore. Symbicort,test strips and promethazine syrup.
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
## 2021-12-11 NOTE — Discharge Instructions (Signed)
Thank you for visiting urgent care today.  It has been a pleasure to serve you.

## 2021-12-12 ENCOUNTER — Other Ambulatory Visit (HOSPITAL_COMMUNITY): Payer: Self-pay

## 2021-12-12 ENCOUNTER — Telehealth: Payer: Self-pay | Admitting: Emergency Medicine

## 2021-12-12 MED ORDER — ACCU-CHEK SOFTCLIX LANCETS MISC: 1.0000 | Freq: Four times a day (QID) | 2 refills | Status: AC

## 2021-12-12 MED ORDER — ACCU-CHEK AVIVA PLUS VI STRP: 1.0000 | ORAL_STRIP | Freq: Four times a day (QID) | 2 refills | Status: DC

## 2021-12-12 NOTE — Telephone Encounter (Signed)
Prescriptions resent at behest of pharmacist.

## 2021-12-14 ENCOUNTER — Telehealth: Payer: Self-pay | Admitting: Orthopedic Surgery

## 2021-12-14 NOTE — Telephone Encounter (Signed)
Called patient today at 10:22am to follow up with scheduling surgery for right knee ACL reconstruction, meniscal debridement vs repair.  Patient's surgery was cancelled for 12-03-21 because patient state she wished to reschedule surgery due to having had a seizure.   [facility] was unable to reach patient prior to surgery to go over medical history.  Patient said she has a neurologist and a PCP but has not been to see one because it has been 6 months since she had a seizure.  When I told patient her surgery would not be scheduled until she was cleared, she became irate and said she was calling bull shit on this excuse and asked me if I have heard of malpractice and said I will let them know you are refusing treatment.  I said " pardon" and patient hung up.

## 2021-12-14 NOTE — Telephone Encounter (Signed)
Patient called asked if Lori Ford will call her back concerning scheduling her surgery. Patient want to know why she can not have her surgery. Patient said she is very upset Patient said she can have her surgery and have not had a seizure in 6 months. The number to contact patient is [phone]

## 2021-12-14 NOTE — Telephone Encounter (Signed)
Holding for Lauren. ?

## 2021-12-25 ENCOUNTER — Other Ambulatory Visit (HOSPITAL_COMMUNITY): Payer: Self-pay

## 2021-12-25 NOTE — Telephone Encounter (Signed)
Hi Lauren.  Please have Randall Hiss call this patient and evaluate her suitability for  treatment at this practice.

## 2021-12-26 ENCOUNTER — Other Ambulatory Visit (HOSPITAL_COMMUNITY): Payer: Self-pay

## 2021-12-26 NOTE — Telephone Encounter (Signed)
Lori Ford is aware. Has discussed this with Dr Marlou Sa and will further pursue

## 2021-12-28 ENCOUNTER — Telehealth: Payer: Self-pay | Admitting: Orthopedic Surgery

## 2021-12-28 ENCOUNTER — Telehealth: Payer: Self-pay | Admitting: Surgery

## 2021-12-28 NOTE — Telephone Encounter (Signed)
Sending to you as I see Dr. Marlou Sa has requested Randall Hiss call patient.

## 2021-12-28 NOTE — Telephone Encounter (Signed)
Lori Ford called asked for a call back as soon as possible. Lori Ford said she fell 12/26/2021 and almost fell again this morning. Lori Ford asked if Dr. Marlou Sa would call her as soon as possible. Lori Ford said  she would like to get set up for surgery. The number to contact Lori Ford is [phone]

## 2021-12-28 NOTE — Telephone Encounter (Signed)
Patient called asked if Jeneen Rinks can refer her to another provide within the practice. The number to contact patient is [phone].

## 2021-12-28 NOTE — Telephone Encounter (Signed)
See other notes in chart of patients previous verbal behavior. Randall Hiss is to reach out to patient today. Once I am notified by Randall Hiss I can reach out.

## 2021-12-31 ENCOUNTER — Other Ambulatory Visit: Payer: Self-pay | Admitting: Internal Medicine

## 2021-12-31 NOTE — Telephone Encounter (Signed)
Pending response from [facility].

## 2022-01-01 ENCOUNTER — Other Ambulatory Visit: Payer: Self-pay

## 2022-01-01 DIAGNOSIS — S83511A Sprain of anterior cruciate ligament of right knee, initial encounter: Secondary | ICD-10-CM

## 2022-01-01 LAB — EXTRA LAV TOP TUBE

## 2022-01-01 LAB — LIPID PANEL
Cholesterol: 163 mg/dL (ref <200)
HDL: 41 mg/dL — ABNORMAL LOW (ref >=50)
LDL Cholesterol (Calc): 105 mg/dL (calc) — ABNORMAL HIGH
Non-HDL Cholesterol (Calc): 122 mg/dL (calc) (ref <130)
Total CHOL/HDL Ratio: 4 (calc) (ref <5.0)
Triglycerides: 82 mg/dL (ref <150)

## 2022-01-01 NOTE — Telephone Encounter (Signed)
Patient aware we will send her to [facility] for a 2nd opinion appointment, patient was ok and happy with this decision

## 2022-01-02 ENCOUNTER — Ambulatory Visit: Payer: Medicaid Other | Admitting: Nurse Practitioner
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
## 2022-01-21 ENCOUNTER — Other Ambulatory Visit (HOSPITAL_COMMUNITY): Payer: Self-pay

## 2022-01-22 ENCOUNTER — Other Ambulatory Visit (HOSPITAL_COMMUNITY): Payer: Self-pay

## 2022-02-04 ENCOUNTER — Other Ambulatory Visit (HOSPITAL_COMMUNITY): Payer: Self-pay

## 2022-02-05 ENCOUNTER — Telehealth: Payer: Self-pay

## 2022-02-05 ENCOUNTER — Other Ambulatory Visit (HOSPITAL_COMMUNITY): Payer: Self-pay

## 2022-02-05 ENCOUNTER — Ambulatory Visit: Payer: Medicaid Other

## 2022-02-05 NOTE — Telephone Encounter (Signed)
[name]  Patient's medication Systems developer) have been couriered to [facility] from [facility] [facility] and will be picked up 02/14/22.  Lori Ford , [facility] Patient [facility] for Infectious Disease Phone: [phone] Fax:  [phone]

## 2022-02-14 ENCOUNTER — Other Ambulatory Visit: Payer: Self-pay

## 2022-02-14 ENCOUNTER — Ambulatory Visit (INDEPENDENT_AMBULATORY_CARE_PROVIDER_SITE_OTHER): Payer: Commercial Managed Care - HMO | Admitting: Family

## 2022-02-14 ENCOUNTER — Encounter: Payer: Self-pay | Admitting: Family

## 2022-02-14 VITALS — BP 117/80 | HR 66 | Temp 98.0°F | Ht 64.0 in | Wt 223.0 lb

## 2022-02-14 DIAGNOSIS — Z59819 Housing instability, housed unspecified: Secondary | ICD-10-CM | POA: Diagnosis not present

## 2022-02-14 DIAGNOSIS — Z0000 Encounter for general adult medical examination without abnormal findings: Secondary | ICD-10-CM | POA: Diagnosis not present

## 2022-02-14 DIAGNOSIS — B20 Human immunodeficiency virus [HIV] disease: Secondary | ICD-10-CM

## 2022-02-14 MED ORDER — BIKTARVY 50-200-25 MG PO TABS: 1.0000 | ORAL_TABLET | Freq: Every day | ORAL | 5 refills | Status: DC
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
## 2022-02-14 NOTE — Patient Instructions (Signed)
Nice to see you.  We will check your lab work today.  Continue to take your medication daily as prescribed.  Refills have been sent to the pharmacy.  Plan for follow up in 4 months or sooner if needed with lab work on the same day.  Have a great day and stay safe!  

## 2022-02-14 NOTE — Assessment & Plan Note (Signed)
Discussed importance of safe sexual practice and condom use. Condoms and STD testing offered.  Declines vaccinations.  

## 2022-02-14 NOTE — Assessment & Plan Note (Signed)
Lori Ford continues to have well controlled virus with good adherence and tolerance to Biktarvy. Reviewed previous lab work and discussed plan of care. Check lab work today. Continue current dose of Biktarvy. Plan for follow up in 4 months or sooner if needed with lab work on the same day.
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
## 2022-02-15 LAB — T-HELPER CELL (CD4) - (RCID CLINIC ONLY)
CD4 % Helper T Cell: 38 % (ref 33–65)
CD4 T Cell Abs: 935 /uL (ref 400–1790)

## 2022-02-16 LAB — COMPREHENSIVE METABOLIC PANEL
AG Ratio: 1.5 (calc) (ref 1.0–2.5)
ALT: 9 U/L (ref 6–29)
AST: 12 U/L (ref 10–30)
Albumin: 4.4 g/dL (ref 3.6–5.1)
Alkaline phosphatase (APISO): 63 U/L (ref 31–125)
BUN: 12 mg/dL (ref 7–25)
CO2: 25 mmol/L (ref 20–32)
Calcium: 9.4 mg/dL (ref 8.6–10.2)
Chloride: 106 mmol/L (ref 98–110)
Creat: 0.92 mg/dL (ref 0.50–0.99)
Globulin: 3 g/dL (calc) (ref 1.9–3.7)
Glucose, Bld: 67 mg/dL (ref 65–99)
Potassium: 4.3 mmol/L (ref 3.5–5.3)
Sodium: 139 mmol/L (ref 135–146)
Total Bilirubin: 0.4 mg/dL (ref 0.2–1.2)
Total Protein: 7.4 g/dL (ref 6.1–8.1)

## 2022-02-16 LAB — HIV-1 RNA QUANT-NO REFLEX-BLD
HIV 1 RNA Quant: <20 Copies/mL — ABNORMAL HIGH
HIV-1 RNA Quant, Log: <1.3 Log cps/mL — ABNORMAL HIGH

## 2022-02-21 ENCOUNTER — Other Ambulatory Visit (HOSPITAL_COMMUNITY): Payer: Self-pay

## 2022-02-25 ENCOUNTER — Other Ambulatory Visit (HOSPITAL_COMMUNITY): Payer: Self-pay

## 2022-03-13 ENCOUNTER — Telehealth: Payer: Self-pay | Admitting: Pulmonary Disease

## 2022-03-13 ENCOUNTER — Encounter: Payer: Self-pay | Admitting: Pulmonary Disease

## 2022-03-13 ENCOUNTER — Ambulatory Visit (INDEPENDENT_AMBULATORY_CARE_PROVIDER_SITE_OTHER): Payer: Commercial Managed Care - HMO | Admitting: Pulmonary Disease

## 2022-03-13 VITALS — BP 118/80 | HR 90 | Ht 64.0 in | Wt 225.8 lb

## 2022-03-13 DIAGNOSIS — R0609 Other forms of dyspnea: Secondary | ICD-10-CM

## 2022-03-13 DIAGNOSIS — J4540 Moderate persistent asthma, uncomplicated: Secondary | ICD-10-CM | POA: Diagnosis not present

## 2022-03-13 NOTE — Telephone Encounter (Signed)
I called pt & gave her appt info for CT - nothing further needed.

## 2022-03-13 NOTE — Patient Instructions (Signed)
Nice to meet you  I will order this pulmonary function test as well as high-resolution CT scan of the chest to further evaluate your symptoms.  Based on the results we can discuss neck steps over the phone or MyChart prior to your next visit.  Return to clinic in 2 months or sooner if needed with Dr. Silas Flood

## 2022-03-13 NOTE — Telephone Encounter (Signed)
Patient states that she just missed a call from the nurse regarding her appt. Today.  She believes it is about some tests that the doctor wants her to get.  Please advise and call patient back to discuss at [phone]

## 2022-03-13 NOTE — Telephone Encounter (Signed)
See where an order was placed for pt to have a CT scheduled. Routing to [facility] in case they were the ones who had tried to call pt about the CT appt.

## 2022-03-13 NOTE — Progress Notes (Signed)
$'@Patient'T$  ID: Lori Ford, female    DOB: [birth year], 46 y.o.   MRN: [id number]  Chief Complaint  Patient presents with   Consult    Pt is here for consult for asthma, copd and bronchitis. Pt is currently on Symbicort and albuterol and nebs and cpap at night. And Flovent inhaler. Pt states that the inhalers dont really help her symptoms. Cpap helps some due to the oxygen being bled in(4L of O2) Pt states she has asthma since childhood. ACT is 8.     Referring provider: Nolene Ebbs, MD  HPI:   46 y.o. woman whom are seen in consultation for recurrent wheeze, shortness of breath.  Most recent PCP note reviewed.  Most recent ED note 08/2021 reviewed.  Patient chief complaint is wheezing, shortness of breath.  Some mild cough more like tickle in the throat.  Been present for at least a year or 2.  She notes she was intubated in the ICU for several days many years ago at [facility], around 2009.  This was with seizures.  Subsequent intubation for seizure in the [place] system in 2016.  She feels like there is a tightness to her obstruction in her throat.  Causes wheeze and shortness of breath.  Does not respond to antibiotics or steroids.  No real improvement.  No improvement with ICS/LABA therapy with high-dose Symbicort.  Albuterol does not provide relief.  Symptoms do improve or nearly relieved when she uses her CPAP at night.  She has OSA.  No seasonal environmental factors she can identify that make things better or worse.  No position make things better or worse.  No other alleviating or exacerbating factors.  Most recent chest x-ray 04/2021 on my review interpretation reveals clear lungs with evidence of mild hyperinflation on the lateral view.  PMH: Hypertension, asthma, diabetes, hyperlipidemia, CVA, DVT, headache, OSA Surgical history: Cholecystectomy Family history: First relatives emphysema Social history: Current smoker, half a cigarette daily, 0.5 cigarettes once a day.,  Lives in  [place] / Pulmonary Flowsheets:   ACT:  Asthma Control Test ACT Total Score  03/13/2022  8:39 AM 8    MMRC:     No data to display          [place]worth:      No data to display          Tests:   FENO:  No results found for: "NITRICOXIDE"  PFT:     No data to display          WALK:      No data to display          Imaging: Personally reviewed and as per EMR discussion this note No results found.  Lab Results: Personally reviewed CBC    Component Value Date/Time   WBC 5.6 10/23/2021 1230   RBC 4.14 10/23/2021 1230   HGB 12.9 10/23/2021 1230   HGB 13.7 09/01/2019 1104   HCT 38.6 10/23/2021 1230   HCT 40.1 09/01/2019 1104   PLT 271 10/23/2021 1230   PLT 194 09/01/2019 1104   MCV 93.2 10/23/2021 1230   MCV 91 09/01/2019 1104   MCH 31.2 10/23/2021 1230   MCHC 33.4 10/23/2021 1230   RDW 14.1 10/23/2021 1230   RDW 13.4 09/01/2019 1104   LYMPHSABS 2,432 04/30/2021 1402   LYMPHSABS 3.2 (H) 06/23/2019 1530   MONOABS 0.5 03/22/2019 1128   EOSABS 0 (L) 04/30/2021 1402   EOSABS 0.0 06/23/2019 1530   BASOSABS  29 04/30/2021 1402   BASOSABS 0.0 06/23/2019 1530    BMET    Component Value Date/Time   NA 139 02/14/2022 1118   NA 140 09/01/2019 1104   K 4.3 02/14/2022 1118   CL 106 02/14/2022 1118   CO2 25 02/14/2022 1118   GLUCOSE 67 02/14/2022 1118   BUN 12 02/14/2022 1118   BUN 11 09/01/2019 1104   CREATININE 0.92 02/14/2022 1118   CALCIUM 9.4 02/14/2022 1118   GFRNONAA >60 04/26/2020 1211   GFRNONAA 74 01/20/2019 1123   GFRAA >60 09/17/2019 0844   GFRAA 86 01/20/2019 1123    BNP No results found for: "BNP"  ProBNP No results found for: "PROBNP"  Specialty Problems       Pulmonary Problems   3m Pulmonary nodule, right upper lobe.  repeat CT chest May 2015.   Asthma    Allergies  Allergen Reactions   Bee Venom Swelling and Anaphylaxis   Doxycycline Hyclate Shortness Of Breath   Ibuprofen Shortness  Of Breath    wheezing   Penicillins Anaphylaxis   Tussionex Pennkinetic Er [Hydrocod Poli-Chlorphe Poli Er] Other (See Comments)    Tongue swelling and bumps   Zofran [Ondansetron] Nausea And Vomiting    Violent and uncontrolled   Clindamycin/Lincomycin Hives   Coconut Flavor Hives   Flagyl [Metronidazole] Other (See Comments)    "it gave me a real bad bacterial infection"   Flavoring Agent Nausea And Vomiting   Other Swelling    "GRAPE SUBSTANCE"   Proanthocyanidin Itching   Aspirin Palpitations    wheezing   Eggs Or Egg-Derived Products Hives   Latex Rash    Rash, wheezing    Immunization History  Administered Date(s) Administered   Hepatitis A, Adult 05/11/2014   Influenza,inj,Quad PF,6+ Mos 04/29/2014   Influenza-Unspecified 12/27/2009   Meningococcal Mcv4o 04/17/2018, 05/14/2021   PPD Test 04/26/2014   Pneumococcal Conjugate-13 04/17/2018   Pneumococcal Polysaccharide-23 04/26/2014, 12/26/2020   Tdap 05/14/2021    Past Medical History:  Diagnosis Date   Allergic rhinitis    Arthritis    Asthma    as child   [place]ign[place]    Cervical cancer ([place] 2006   Chronic back pain    Cigarette nicotine dependence with withdrawal    Diabetes mellitus without complication (HCC)    DVT (deep venous thrombosis) (HCC)    Fibromyalgia    Gallstones    s/p cholecystectomy   GERD (gastroesophageal reflux disease)    HIV (human immunodeficiency virus infection) (HCC)    HTN (hypertension)    Meningioma (HCC)    Migraine    Sciatic pain    Seizure disorder, grand mal ([place]    dx 2005   Seizures ([place]    due to head trauma as adult    Tobacco History: Social History   Tobacco Use  Smoking Status Former   Packs/day: 0.25   Years: 22.00   Total pack years: 5.50   Types: Cigarettes  Smokeless Tobacco Never  Tobacco Comments   Pt states she smokes 1/2 cig a day. 03/13/2022   Counseling given: Not Answered Tobacco comments: Pt states she smokes 1/2  cig a day. 03/13/2022   Continue to not smoke  Outpatient Encounter Medications as of 03/13/2022  Medication Sig   Accu-Chek Softclix Lancets lancets 1 each by Other route 4 (four) times daily. Use as instructed   acetaminophen (TYLENOL) 500 MG tablet Take 2 tablets (1,000 mg total) by mouth every 6 (six) hours  as needed.   albuterol (PROVENTIL) (2.5 MG/3ML) 0.083% nebulizer solution Take 3 mLs (2.5 mg total) by nebulization every 4 (four) hours as needed for wheezing or shortness of breath.   amLODipine (NORVASC) 10 MG tablet TAKE 1 TABLET(10 MG) BY MOUTH DAILY   atorvastatin (LIPITOR) 20 MG tablet Take 1 tablet (20 mg total) by mouth at bedtime.   bictegravir-emtricitabine-tenofovir AF (BIKTARVY) 50-200-25 MG TABS tablet Take 1 tablet by mouth daily.   Blood Pressure Monitor DEVI 1 Units by Does not apply route 3 (three) times daily.   budesonide-formoterol (SYMBICORT) 160-4.5 MCG/ACT inhaler Inhale 2 puffs into the lungs 2 (two) times daily.   enoxaparin (LOVENOX) 40 MG/0.4ML injection Inject 0.4 mLs (40 mg total) into the skin daily.   Erenumab-aooe 70 MG/ML SOAJ Inject 70 mg into the skin every 30 (thirty) days.    famotidine (PEPCID) 40 MG tablet Take 1 tablet (40 mg total) by mouth at bedtime.   fluticasone (FLONASE) 50 MCG/ACT nasal spray Place 2 sprays into both nostrils daily.   glucose blood (ACCU-CHEK AVIVA PLUS) test strip 1 each by Other route 4 (four) times daily. Use as instructed 4 times daily x100 days with 2 refills   hydrochlorothiazide (HYDRODIURIL) 25 MG tablet TAKE 1 TABLET(25 MG) BY MOUTH DAILY   Insulin Glargine Solostar (LANTUS) 100 UNIT/ML Solostar Pen Inject 10 Units into the skin daily at 6 (six) AM.   Insulin Pen Needle (PEN NEEDLES) 32G X 4 MM MISC 1 Needle by Does not apply route daily at 6 (six) AM.   Insulin Syringe-Needle U-100 25G X 5/8" 1 ML MISC With use of lantus administration   JANUVIA 100 MG tablet Take 1 tablet (100 mg total) by mouth daily.    lidocaine (LMX) 4 % cream Apply 1 application topically 3 (three) times daily as needed.   liraglutide (VICTOZA) 18 MG/3ML SOPN INJECT 1.8 MG INTO THE SKIN ONCE DAILY.   mirtazapine (REMERON) 15 MG tablet Take 1 tablet (15 mg total) by mouth at bedtime.   Misc. Devices MISC Needs tubing/setup for home nebulizer. Diagnosis- asthma.   montelukast (SINGULAIR) 10 MG tablet Take 1 tablet (10 mg total) by mouth at bedtime.   omeprazole (PRILOSEC) 40 MG capsule Take 1 capsule (40 mg total) by mouth daily.   PROAIR HFA 108 (90 Base) MCG/ACT inhaler Inhale 2 puffs into the lungs every 4 (four) hours as needed for wheezing or shortness of breath.   propranolol (INDERAL) 20 MG tablet Take 20 mg by mouth daily.   Respiratory Therapy Supplies (NEBULIZER) DEVI Nebulizer machine Dispense #1 Diagnosis: COPD   Sodium Sulfate-Mag Sulfate-KCl (SUTAB) [id number] MG TABS Take 24 Tablets Per Prep Sheet   Spacer/Aero-Holding Chambers (AEROCHAMBER PLUS) inhaler Use as instructed   Vitamin D, Ergocalciferol, (DRISDOL) 1.25 MG (50000 UNIT) CAPS capsule Take 1 (one) Capsule by mouth weekly   [DISCONTINUED] ADVAIR DISKUS 100-50 MCG/DOSE AEPB INHALE 1 PUFF INTO THE LUNGS DAILY.   [DISCONTINUED] divalproex (DEPAKOTE ER) 500 MG 24 hr tablet Take 2 tablets (1,000 mg total) by mouth 2 (two) times daily. (Patient not taking: Reported on 09/17/2019)   [DISCONTINUED] levETIRAcetam (KEPPRA) 500 MG tablet Take 1 tablet (500 mg total) by mouth 2 (two) times daily for 14 days. After 2 weeks increase to 2 tablets twice per day by mouth. (Patient not taking: Reported on 09/17/2019)   [DISCONTINUED] loratadine (CLARITIN) 10 MG tablet Take 1 tablet (10 mg total) by mouth daily.   [DISCONTINUED] phenytoin (DILANTIN) 100 MG ER capsule Take  2 capsules (200 mg total) by mouth 3 (three) times daily. (Patient not taking: Reported on 09/17/2019)   [DISCONTINUED] prochlorperazine (COMPAZINE) 5 MG tablet TAKE 1 TABLET BY MOUTH EVERY 6 HOURS AS NEEDED  FOR NAUSEA/VOMITING.   No facility-administered encounter medications on file as of 03/13/2022.     Review of Systems  Review of Systems  No chest pain exertion.  No orthopnea or PND.  Comprehensive review of systems otherwise negative. Physical Exam  BP 118/80 (BP Location: Left Arm, Patient Position: Sitting, Cuff Size: Large)   Pulse 90   Ht '5\' 4"'$  (1.626 m)   Wt 225 lb 12.8 oz (102.4 kg)   SpO2 99%   BMI 38.76 kg/m   Wt Readings from Last 5 Encounters:  03/13/22 225 lb 12.8 oz (102.4 kg)  02/14/22 223 lb (101.2 kg)  10/31/21 212 lb (96.2 kg)  10/26/21 212 lb (96.2 kg)  10/10/21 212 lb (96.2 kg)    BMI Readings from Last 5 Encounters:  03/13/22 38.76 kg/m  02/14/22 38.28 kg/m  10/31/21 36.39 kg/m  10/26/21 36.39 kg/m  10/10/21 36.39 kg/m     Physical Exam General: Sitting in chair, no acute distress Eyes: EOMI, no icterus Neck: Supple, no JVP Pulmonary: Clear, normal work of breathing Abdomen: Nondistended, sounds present Cardiovascular: Warm, no edema Neuro: Normal gait, no weakness Psych: Normal mood, full affect MSK: No synovitis, no joint effusion    Assessment & Plan:   Wheeze, throat tightness/chest tightness/shortness of breath: All resolved with use of CPAP at night.  No appreciable improvement or significant improvement using asthma therapies including ICS/LABA therapy and as needed [place] therapy.  Certainly there could be a component of asthma but given symptoms resolved with CPAP, worry about tracheal pathology.  She has been intubated twice in the past, tracheal stenosis possible.  No stridor or Shaili exam.  In addition, concern for tracheobronchomalacia.  High-res CT scan of the chest ordered to evaluate both.  Asthma: Diagnosed in the past.  No change to medications now.  Unfortunate not improved with these therapies.  Consider phenotyping, escalation of therapies if CT imaging is clear and tracheal pathology is ruled out.  Throat  tightness, intermittent: Possibly related to above.  Some suspicion for vocal cord dysfunction.  Reassess after additional testing.   Return in about 2 months (around 05/13/2022).   Lanier Clam, MD 03/13/2022   This appointment required 63 minutes of patient care (this includes precharting, chart review, review of results, face-to-face care, etc.).

## 2022-03-19 ENCOUNTER — Other Ambulatory Visit: Payer: Self-pay | Admitting: Pharmacist

## 2022-03-19 ENCOUNTER — Other Ambulatory Visit (HOSPITAL_COMMUNITY): Payer: Self-pay

## 2022-03-19 ENCOUNTER — Other Ambulatory Visit: Payer: Self-pay | Admitting: Family

## 2022-03-19 DIAGNOSIS — B20 Human immunodeficiency virus [HIV] disease: Secondary | ICD-10-CM

## 2022-03-19 MED ORDER — BIKTARVY 50-200-25 MG PO TABS
1.0000 | ORAL_TABLET | Freq: Every day | ORAL | 5 refills | Status: DC
  Filled 2022-03-19 – 2022-03-20 (×2): qty 30, 30d supply, fill #0
  Filled 2022-04-30: qty 30, 30d supply, fill #1
  Filled 2022-05-28: qty 30, 30d supply, fill #2

## 2022-03-20 ENCOUNTER — Other Ambulatory Visit (HOSPITAL_COMMUNITY): Payer: Self-pay

## 2022-03-20 ENCOUNTER — Other Ambulatory Visit: Payer: Self-pay

## 2022-03-28 ENCOUNTER — Other Ambulatory Visit (HOSPITAL_COMMUNITY): Payer: Self-pay

## 2022-03-29 ENCOUNTER — Encounter (HOSPITAL_COMMUNITY): Admission: RE | Admit: 2022-03-29 | Payer: Commercial Managed Care - HMO | Source: Ambulatory Visit

## 2022-03-29 ENCOUNTER — Other Ambulatory Visit (HOSPITAL_COMMUNITY): Payer: Self-pay

## 2022-04-01 ENCOUNTER — Other Ambulatory Visit: Payer: Self-pay

## 2022-04-01 ENCOUNTER — Other Ambulatory Visit (HOSPITAL_COMMUNITY): Payer: Self-pay

## 2022-04-02 ENCOUNTER — Other Ambulatory Visit (HOSPITAL_COMMUNITY): Payer: Self-pay

## 2022-04-02 ENCOUNTER — Other Ambulatory Visit: Payer: Self-pay

## 2022-04-03 ENCOUNTER — Telehealth: Payer: Self-pay

## 2022-04-03 NOTE — Telephone Encounter (Signed)
[name]  Patient's medications (biktarvy) have been couriered to [facility] from [employer] , and will be picked up on  04/23/2022.

## 2022-04-04 ENCOUNTER — Other Ambulatory Visit: Payer: Self-pay

## 2022-04-07 NOTE — Patient Instructions (Signed)
SURGICAL WAITING ROOM VISITATION Patients having surgery or a procedure may have no more than 2 support people in the waiting area - these visitors may rotate in the visitor waiting room.   Due to an increase in RSV and influenza rates and associated hospitalizations, children ages 13 and under may not visit patients in [place]. If the patient needs to stay at the hospital during part of their recovery, the visitor guidelines for inpatient rooms apply.  PRE-OP VISITATION  Pre-op nurse will coordinate an appropriate time for 1 support person to accompany the patient in pre-op.  This support person may not rotate.  This visitor will be contacted when the time is appropriate for the visitor to come back in the pre-op area.  Please refer to the [facility] website for the visitor guidelines for Inpatients (after your surgery is over and you are in a regular room).  You are not required to quarantine at this time prior to your surgery. However, you must do this: Hand Hygiene often Do NOT share personal items Notify your provider if you are in close contact with someone who has COVID or you develop fever 100.4 or greater, new onset of sneezing, cough, sore throat, shortness of breath or body aches.  If you test positive for Covid or have been in contact with anyone that has tested positive in the last 10 days please notify you surgeon.    Your procedure is scheduled on:    Report to [facility] Main Entrance: [address] entrance where the [employer] is available.   Report to admitting at:     AM  +++++Call this number if you have any questions or problems the morning of surgery [phone]  Do not eat food after Midnight the night prior to your surgery/procedure.  After Midnight you may have the following liquids until         AM / PM DAY OF SURGERY  Clear Liquid Diet Water Black Coffee (sugar ok, NO MILK/CREAM OR CREAMERS)  Tea (sugar ok, NO MILK/CREAM OR CREAMERS)  regular and decaf                             Plain Jell-O  with no fruit (NO RED)                                           Fruit ices (not with fruit pulp, NO RED)                                     Popsicles (NO RED)                                                                  Juice: apple, WHITE grape, WHITE cranberry Sports drinks like Gatorade or Powerade (NO RED)                   The day of surgery:  Drink ONE (1) Pre-Surgery Clear Ensure or G2 at  AM the morning of surgery. Drink in one sitting. Do not sip.  This drink was given to you during your hospital pre-op appointment visit. Nothing else to drink after completing the Pre-Surgery Clear Ensure or G2 : No candy, chewing gum or throat lozenges.    FOLLOW BOWEL PREP AND ANY ADDITIONAL PRE OP INSTRUCTIONS YOU RECEIVED FROM YOUR SURGEON'S OFFICE!!!   Oral Hygiene is also important to reduce your risk of infection.        Remember - BRUSH YOUR TEETH THE MORNING OF SURGERY WITH YOUR REGULAR TOOTHPASTE  Do NOT smoke after Midnight the night before surgery.  Take ONLY these medicines the morning of surgery with A SIP OF WATER: Oxycodone, Propranolol (Inderal), Amlodipine, omeprazole (Prilosec), ???Biktarvey,.  You may used your Nebulizer, Inhalers and nasal spray if needed   If You have been diagnosed with Sleep Apnea - Bring CPAP mask and tubing day of surgery. We will provide you with a CPAP machine on the day of your surgery.                   You may not have any metal on your body including hair pins, jewelry, and body piercing  Do not wear make-up, lotions, powders, perfumes or deodorant  Do not wear nail polish including gel and S&S, artificial / acrylic nails, or any other type of covering on natural nails including finger and toenails. If you have artificial nails, gel coating, etc., that needs to be removed by a nail salon, Please have this removed prior to surgery. Not doing so may mean that your surgery  could be cancelled or delayed if the Surgeon or anesthesia staff feels like they are unable to monitor you safely.   Do not shave 48 hours prior to surgery to avoid nicks in your skin which may contribute to postoperative infections.   Contacts, Hearing Aids, dentures or bridgework may not be worn into surgery. DENTURES WILL BE REMOVED PRIOR TO SURGERY PLEASE DO NOT APPLY "Poly grip" OR ADHESIVES!!!  Patients discharged on the day of surgery will not be allowed to drive home.  Someone NEEDS to stay with you for the first 24 hours after anesthesia.  Do not bring your home medications to the hospital. The Pharmacy will dispense medications listed on your medication list to you during your admission in the Hospital.  Special Instructions: Bring a copy of your healthcare power of attorney and living will documents the day of surgery, if you wish to have them scanned into your [facility] Medical Records- EPIC  Please read over the following fact sheets you were given: IF YOU HAVE QUESTIONS ABOUT YOUR PRE-OP INSTRUCTIONS, PLEASE CALL [phone]  ([place])   [place] - Preparing for Surgery Before surgery, you can play an important role.  Because skin is not sterile, your skin needs to be as free of germs as possible.  You can reduce the number of germs on your skin by washing with CHG (chlorahexidine gluconate) soap before surgery.  CHG is an antiseptic cleaner which kills germs and bonds with the skin to continue killing germs even after washing. Please DO NOT use if you have an allergy to CHG or antibacterial soaps.  If your skin becomes reddened/irritated stop using the CHG and inform your nurse when you arrive at Short Stay. Do not shave (including legs and underarms) for at least 48 hours prior to the first CHG shower.  You may shave your face/neck.  Please follow these instructions carefully:  1.  Shower with CHG Soap the night before surgery and the  morning of surgery.  2.  If you choose to  wash your hair, wash your hair first as usual with your normal  shampoo.  3.  After you shampoo, rinse your hair and body thoroughly to remove the shampoo.                             4.  Use CHG as you would any other liquid soap.  You can apply chg directly to the skin and wash.  Gently with a scrungie or clean washcloth.  5.  Apply the CHG Soap to your body ONLY FROM THE NECK DOWN.   Do not use on face/ open                           Wound or open sores. Avoid contact with eyes, ears mouth and genitals (private parts).                       Wash face,  Genitals (private parts) with your normal soap.             6.  Wash thoroughly, paying special attention to the area where your  surgery  will be performed.  7.  Thoroughly rinse your body with warm water from the neck down.  8.  DO NOT shower/wash with your normal soap after using and rinsing off the CHG Soap.            9.  Pat yourself dry with a clean towel.            10.  Wear clean pajamas.            11.  Place clean sheets on your bed the night of your first shower and do not  sleep with pets.  ON THE DAY OF SURGERY : Do not apply any lotions/deodorants the morning of surgery.  Please wear clean clothes to the hospital/surgery center.    FAILURE TO FOLLOW THESE INSTRUCTIONS MAY RESULT IN THE CANCELLATION OF YOUR SURGERY  PATIENT SIGNATURE_________________________________  NURSE SIGNATURE__________________________________  ________________________________________________________________________        Adam Phenix    An incentive spirometer is a tool that can help keep your lungs clear and active. This tool measures how well you are filling your lungs with each breath. Taking long deep breaths may help reverse or decrease the chance of developing breathing (pulmonary) problems (especially infection) following: A long period of time when you are unable to move or be active. BEFORE THE PROCEDURE  If the  spirometer includes an indicator to show your best effort, your nurse or respiratory therapist will set it to a desired goal. If possible, sit up straight or lean slightly forward. Try not to slouch. Hold the incentive spirometer in an upright position. INSTRUCTIONS FOR USE  Sit on the edge of your bed if possible, or sit up as far as you can in bed or on a chair. Hold the incentive spirometer in an upright position. Breathe out normally. Place the mouthpiece in your mouth and seal your lips tightly around it. Breathe in slowly and as deeply as possible, raising the piston or the ball toward the top of the column. Hold your breath for 3-5 seconds or for as long as possible. Allow the piston or ball to fall to the  bottom of the column. Remove the mouthpiece from your mouth and breathe out normally. Rest for a few seconds and repeat Steps 1 through 7 at least 10 times every 1-2 hours when you are awake. Take your time and take a few normal breaths between deep breaths. The spirometer may include an indicator to show your best effort. Use the indicator as a goal to work toward during each repetition. After each set of 10 deep breaths, practice coughing to be sure your lungs are clear. If you have an incision (the cut made at the time of surgery), support your incision when coughing by placing a pillow or rolled up towels firmly against it. Once you are able to get out of bed, walk around indoors and cough well. You may stop using the incentive spirometer when instructed by your caregiver.  RISKS AND COMPLICATIONS Take your time so you do not get dizzy or light-headed. If you are in pain, you may need to take or ask for pain medication before doing incentive spirometry. It is harder to take a deep breath if you are having pain. AFTER USE Rest and breathe slowly and easily. It can be helpful to keep track of a log of your progress. Your caregiver can provide you with a simple table to help with  this. If you are using the spirometer at home, follow these instructions: [place] IF:  You are having difficultly using the spirometer. You have trouble using the spirometer as often as instructed. Your pain medication is not giving enough relief while using the spirometer. You develop fever of 100.5 F (38.1 C) or higher.                                                                                                    SEEK IMMEDIATE MEDICAL CARE IF:  You cough up bloody sputum that had not been present before. You develop fever of 102 F (38.9 C) or greater. You develop worsening pain at or near the incision site. MAKE SURE YOU:  Understand these instructions. Will watch your condition. Will get help right away if you are not doing well or get worse. Document Released: 08/12/2006 Document Revised: 06/24/2011 Document Reviewed: 10/13/2006 [facility] Patient Information 2014 [place], [place].

## 2022-04-07 NOTE — Progress Notes (Signed)
COVID Vaccine received:  _0  No _1  Yes Date of any COVID positive Test in last 90 days:  PCP - Nolene Ebbs, MD Cardiologist -  ID: Alcide Evener, MD Pulmonology- Larey Days, MD    [place] 03-13-22 Neurology/ Sleep- Janace Litten, MD    [place]    [phone]  Chest x-ray - 04-24-2021   2v EKG - 06-23-2019  will repeat at PST  Stress Test -  ECHO -  Cardiac Cath -   PCR screen: _2  Ordered & Completed                      _3   No Order but Needs PROFEND                      _4   N/A for this surgery  Surgery Plan:  _5  Ambulatory                            _6  Outpatient in bed                            _7  Admit  Anesthesia:    _8  General  _9  Spinal                           _10   Choice _11   MAC  Pacemaker / ICD device _12  No _13  Yes        Device order form faxed _14  No    _15   Yes      Faxed to:  Spinal Cord Stimulator:_16  No _17  Yes      (Remind patient to bring remote DOS) Other Implants:   History of Sleep Apnea? _18  No _19  Yes   CPAP used?- _20  No _21  Yes    Does the patient monitor blood sugar? _22  No _23  Yes  _24  N/A Does patient have a [employer] or Dexacom? _25  No _26  Yes   Fasting Blood Sugar Ranges-  Checks Blood Sugar _____ times a day  Last dose of GLP1 agonist-  Victoza Inj.  Daily GLP1 instructions: Hold med. X 24 hours prior to DOS. Resume the day after surgery  Januvia- Day Before surgery: Take as usual.    Day of surgery: DO NOT Take this medication.  Insulin Glargine (Lantus)- Day before Surgery: Inject usual 100 units in the morning.   Day Of Surgery:  only inject 50% or 50 units of this medication.   Blood Thinner / Instructions:None Aspirin Instructions: NONE  ERAS Protocol Ordered: _27  No  _28  Yes PRE-SURGERY _29  ENSURE  _30  G2  Patient is to be NPO after: 10:15 AM  Comments:   Activity level: Patient can / can not climb a flight of stairs without difficulty; _31  No CP  _32  No SOB, but would have ______   Patient can / can not  perform ADLs without assistance.   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  Patient verbalized understanding and agreement to the Pre-Surgical Instructions that were given to them at this PAT appointment. Patient was also educated of the need to review these PAT instructions again prior to his/her surgery.I reviewed the appropriate phone  numbers to call if they have any and questions or concerns.

## 2022-04-09 ENCOUNTER — Other Ambulatory Visit: Payer: Self-pay

## 2022-04-09 ENCOUNTER — Encounter (HOSPITAL_COMMUNITY)
Admission: RE | Admit: 2022-04-09 | Discharge: 2022-04-09 | Disposition: A | Discharge status: Home / Self Care | Payer: Commercial Managed Care - HMO | Source: Ambulatory Visit | Attending: Anesthesiology | Admitting: Anesthesiology

## 2022-04-09 ENCOUNTER — Ambulatory Visit (HOSPITAL_COMMUNITY)
Admission: EM | Admit: 2022-04-09 | Discharge: 2022-04-09 | Disposition: A | Discharge status: Home / Self Care | Payer: Commercial Managed Care - HMO

## 2022-04-09 DIAGNOSIS — Z95828 Presence of other vascular implants and grafts: Secondary | ICD-10-CM

## 2022-04-09 DIAGNOSIS — E1169 Type 2 diabetes mellitus with other specified complication: Secondary | ICD-10-CM

## 2022-04-09 DIAGNOSIS — M79605 Pain in left leg: Secondary | ICD-10-CM

## 2022-04-09 DIAGNOSIS — F339 Major depressive disorder, recurrent, unspecified: Secondary | ICD-10-CM

## 2022-04-09 DIAGNOSIS — Z5181 Encounter for therapeutic drug level monitoring: Secondary | ICD-10-CM

## 2022-04-09 DIAGNOSIS — R569 Unspecified convulsions: Secondary | ICD-10-CM

## 2022-04-09 DIAGNOSIS — R0789 Other chest pain: Secondary | ICD-10-CM

## 2022-04-09 DIAGNOSIS — Z0000 Encounter for general adult medical examination without abnormal findings: Secondary | ICD-10-CM

## 2022-04-09 DIAGNOSIS — E119 Type 2 diabetes mellitus without complications: Secondary | ICD-10-CM

## 2022-04-09 DIAGNOSIS — B20 Human immunodeficiency virus [HIV] disease: Secondary | ICD-10-CM

## 2022-04-09 DIAGNOSIS — G40409 Other generalized epilepsy and epileptic syndromes, not intractable, without status epilepticus: Secondary | ICD-10-CM

## 2022-04-09 DIAGNOSIS — N921 Excessive and frequent menstruation with irregular cycle: Secondary | ICD-10-CM

## 2022-04-09 DIAGNOSIS — E669 Obesity, unspecified: Secondary | ICD-10-CM

## 2022-04-09 DIAGNOSIS — Z01818 Encounter for other preprocedural examination: Secondary | ICD-10-CM

## 2022-04-09 DIAGNOSIS — I10 Essential (primary) hypertension: Secondary | ICD-10-CM

## 2022-04-09 DIAGNOSIS — M4722 Other spondylosis with radiculopathy, cervical region: Secondary | ICD-10-CM

## 2022-04-09 DIAGNOSIS — Z59819 Housing instability, housed unspecified: Secondary | ICD-10-CM

## 2022-04-09 DIAGNOSIS — F17213 Nicotine dependence, cigarettes, with withdrawal: Secondary | ICD-10-CM

## 2022-04-09 DIAGNOSIS — R112 Nausea with vomiting, unspecified: Secondary | ICD-10-CM

## 2022-04-09 DIAGNOSIS — R911 Solitary pulmonary nodule: Secondary | ICD-10-CM

## 2022-04-09 DIAGNOSIS — E875 Hyperkalemia: Secondary | ICD-10-CM

## 2022-04-10 ENCOUNTER — Ambulatory Visit (HOSPITAL_COMMUNITY)
Admission: EM | Admit: 2022-04-10 | Discharge: 2022-04-10 | Disposition: A | Discharge status: Home / Self Care | Payer: Medicaid Other | Attending: Sports Medicine | Admitting: Sports Medicine

## 2022-04-10 ENCOUNTER — Encounter (HOSPITAL_COMMUNITY): Payer: Self-pay

## 2022-04-10 DIAGNOSIS — L723 Sebaceous cyst: Secondary | ICD-10-CM

## 2022-04-10 MED ORDER — LIDOCAINE-EPINEPHRINE 1 %-1:100000 IJ SOLN: 3.0000 mL | Freq: Once | INTRAMUSCULAR | Status: DC

## 2022-04-10 MED ORDER — SULFAMETHOXAZOLE-TRIMETHOPRIM 800-160 MG PO TABS: 1.0000 | ORAL_TABLET | Freq: Two times a day (BID) | ORAL | 0 refills | Status: AC

## 2022-04-10 NOTE — ED Triage Notes (Signed)
Nickel size abscess on forehead, that started forming 2 weeks ago. Patient says it popped a week ago and has been leaking yellow green pus since then.
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
## 2022-04-11 NOTE — Progress Notes (Signed)
Spoke with Keri at Dr. Stann Mainland office regarding cancellation of surgery.  Patient was a no show for PAT visit on 04-09-22 and told the nurse that she was going to cancel surgery.  Kristin Bruins stated that she would reach out to patient to confirm cancellation and remove from schedule.

## 2022-04-12 ENCOUNTER — Ambulatory Visit (HOSPITAL_COMMUNITY)
Admission: RE | Admit: 2022-04-12 | Payer: Commercial Managed Care - HMO | Source: Home / Self Care | Admitting: Orthopedic Surgery

## 2022-04-12 ENCOUNTER — Ambulatory Visit (HOSPITAL_COMMUNITY)
Admission: EM | Admit: 2022-04-12 | Discharge: 2022-04-12 | Disposition: A | Discharge status: Home / Self Care | Payer: Commercial Managed Care - HMO | Attending: Nurse Practitioner | Admitting: Nurse Practitioner

## 2022-04-12 ENCOUNTER — Inpatient Hospital Stay: Admission: RE | Admit: 2022-04-12 | Payer: Commercial Managed Care - HMO | Source: Ambulatory Visit

## 2022-04-12 ENCOUNTER — Encounter (HOSPITAL_COMMUNITY): Admission: RE | Payer: Self-pay | Source: Home / Self Care

## 2022-04-12 ENCOUNTER — Encounter (HOSPITAL_COMMUNITY): Payer: Self-pay | Admitting: Emergency Medicine

## 2022-04-12 DIAGNOSIS — Z760 Encounter for issue of repeat prescription: Secondary | ICD-10-CM | POA: Diagnosis not present

## 2022-04-12 DIAGNOSIS — J45909 Unspecified asthma, uncomplicated: Secondary | ICD-10-CM

## 2022-04-12 DIAGNOSIS — E119 Type 2 diabetes mellitus without complications: Secondary | ICD-10-CM

## 2022-04-12 LAB — AEROBIC CULTURE W GRAM STAIN (SUPERFICIAL SPECIMEN): Gram Stain: NONE SEEN

## 2022-04-12 SURGERY — KNEE ARTHROSCOPY WITH ANTERIOR CRUCIATE LIGAMENT (ACL) RECONSTRUCTION WITH HAMSTRING GRAFT
Anesthesia: Choice | Laterality: Right

## 2022-04-12 MED ORDER — BUDESONIDE-FORMOTEROL FUMARATE 160-4.5 MCG/ACT IN AERO: 2.0000 | INHALATION_SPRAY | Freq: Two times a day (BID) | RESPIRATORY_TRACT | 1 refills | Status: AC

## 2022-04-12 MED ORDER — ACCU-CHEK AVIVA PLUS VI STRP: 1.0000 | ORAL_STRIP | Freq: Four times a day (QID) | 0 refills | Status: DC

## 2022-04-12 MED ORDER — LIRAGLUTIDE 18 MG/3ML ~~LOC~~ SOPN: 1.8000 mg | PEN_INJECTOR | Freq: Every day | SUBCUTANEOUS | 0 refills | Status: DC

## 2022-04-12 NOTE — ED Triage Notes (Signed)
Pt needing refill on Symibort, Victoza and glucos testing strips. Unable to get an appt with doctor until March 2024.
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
## 2022-04-18 ENCOUNTER — Telehealth (HOSPITAL_COMMUNITY): Payer: Self-pay | Admitting: Emergency Medicine

## 2022-04-18 MED ORDER — ACCU-CHEK SAFE-T PRO LANCETS MISC: 12 refills | Status: DC

## 2022-04-18 NOTE — Telephone Encounter (Signed)
Resent prescription for accu-chek lancets - previous prescription not covered by patient insurance

## 2022-04-23 ENCOUNTER — Ambulatory Visit: Payer: Commercial Managed Care - HMO

## 2022-04-24 ENCOUNTER — Ambulatory Visit: Payer: Commercial Managed Care - HMO | Admitting: Pulmonary Disease

## 2022-04-30 ENCOUNTER — Other Ambulatory Visit (HOSPITAL_COMMUNITY): Payer: Self-pay

## 2022-05-03 ENCOUNTER — Other Ambulatory Visit (HOSPITAL_COMMUNITY): Payer: Self-pay

## 2022-05-07 ENCOUNTER — Telehealth: Payer: Self-pay

## 2022-05-07 NOTE — Telephone Encounter (Signed)
[name]  Patient's medications have been couriered to [facility] from [place] and will be picked up 05/07/22.  [place] , [place] Patient [facility] Phone: [phone] Fax:  [phone]

## 2022-05-09 ENCOUNTER — Other Ambulatory Visit (HOSPITAL_COMMUNITY): Payer: Self-pay

## 2022-05-14 ENCOUNTER — Ambulatory Visit
Admission: RE | Admit: 2022-05-14 | Discharge: 2022-05-14 | Disposition: A | Discharge status: Home / Self Care | Payer: Medicaid Other | Source: Ambulatory Visit | Attending: Pulmonary Disease | Admitting: Pulmonary Disease

## 2022-05-14 DIAGNOSIS — R0609 Other forms of dyspnea: Secondary | ICD-10-CM

## 2022-05-15 ENCOUNTER — Encounter (HOSPITAL_COMMUNITY): Payer: Self-pay

## 2022-05-15 ENCOUNTER — Ambulatory Visit (HOSPITAL_COMMUNITY)
Admission: RE | Admit: 2022-05-15 | Discharge: 2022-05-15 | Disposition: A | Discharge status: Home / Self Care | Payer: Medicaid Other | Source: Ambulatory Visit | Attending: Family Medicine | Admitting: Family Medicine

## 2022-05-15 VITALS — BP 105/69 | HR 82 | Temp 99.5°F | Resp 16

## 2022-05-15 DIAGNOSIS — J029 Acute pharyngitis, unspecified: Secondary | ICD-10-CM

## 2022-05-15 DIAGNOSIS — I889 Nonspecific lymphadenitis, unspecified: Secondary | ICD-10-CM

## 2022-05-15 LAB — POCT RAPID STREP A, ED / UC: Streptococcus, Group A Screen (Direct): NEGATIVE

## 2022-05-15 MED ORDER — FLUCONAZOLE 150 MG PO TABS: 150.0000 mg | ORAL_TABLET | ORAL | 0 refills | Status: AC

## 2022-05-15 MED ORDER — PREDNISONE 20 MG PO TABS: 40.0000 mg | ORAL_TABLET | Freq: Every day | ORAL | 0 refills | Status: AC

## 2022-05-15 MED ORDER — CEPHALEXIN 500 MG PO CAPS: 500.0000 mg | ORAL_CAPSULE | Freq: Four times a day (QID) | ORAL | 0 refills | Status: DC
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
## 2022-05-15 NOTE — Progress Notes (Signed)
CT scan shows signs of inflammation in air tubes like asthma or bronchitis. No signs of damage or abnormality in the trachea or main breathing tube which is very good news.

## 2022-05-15 NOTE — ED Triage Notes (Signed)
Pt states left sided sore throat,ear pain,and facial swelling since this am.

## 2022-05-15 NOTE — Discharge Instructions (Addendum)
Your strep test is negative.  I still want to treat you for inflammation and infection and the lymph node on your left neck.  Take cephalexin 250 mg--1 capsule 3 times daily for 7 days  Take prednisone 20 mg--2 daily for 3 days    Please review your allergy list with your infectious disease doctor the next time you see them, specifically your penicillin allergy listed

## 2022-05-16 ENCOUNTER — Ambulatory Visit: Payer: Medicaid Other | Admitting: Pulmonary Disease

## 2022-05-20 NOTE — Progress Notes (Signed)
Ok to see 2/7, will need to get PFT scheduled when she is in office for f/u.

## 2022-05-22 ENCOUNTER — Ambulatory Visit: Payer: Medicaid Other | Admitting: Pulmonary Disease

## 2022-05-28 ENCOUNTER — Other Ambulatory Visit (HOSPITAL_COMMUNITY): Payer: Self-pay

## 2022-05-29 ENCOUNTER — Other Ambulatory Visit: Payer: Self-pay

## 2022-05-30 ENCOUNTER — Telehealth: Payer: Self-pay

## 2022-05-30 NOTE — Telephone Encounter (Signed)
[name]  Patient's medications have been couriered to [facility] from [facility] and will be picked up .  [place] , [place] Patient [facility] Phone: [phone] Fax:  [phone]

## 2022-06-12 ENCOUNTER — Ambulatory Visit: Payer: Medicaid Other | Admitting: Pulmonary Disease

## 2022-06-13 NOTE — Patient Instructions (Addendum)
DUE TO COVID-19 ONLY TWO VISITORS  (aged 45 and older)  ARE ALLOWED TO COME WITH YOU AND STAY IN THE WAITING ROOM ONLY DURING PRE OP AND PROCEDURE.    IF YOU WILL BE ADMITTED INTO THE HOSPITAL YOU ARE ALLOWED ONLY FOUR SUPPORT PEOPLE DURING VISITATION HOURS ONLY (7 AM -8PM)   The support person(s) must pass our screening, gel in and out, and wear a mask at all times, including in the patient's room. Patients must also wear a mask when staff or their support person are in the room. Visitors GUEST BADGE MUST BE WORN VISIBLY  One adult visitor may remain with you overnight and MUST be in the room by 8 P.M.     Your procedure is scheduled on: 06/21/22   Report to [facility] Main Entrance    Report to admitting at  8:45 AM   Call this number if you have problems the morning of surgery [phone]   Do not eat food :After Midnight.   After Midnight you may have the following liquids until 8:00 _AM/ DAY OF SURGERY  Water Black Coffee (sugar ok, NO MILK/CREAM OR CREAMERS)  Tea (sugar ok, NO MILK/CREAM OR CREAMERS) regular and decaf                             Plain Jell-O (NO RED)                                           Fruit ices (not with fruit pulp, NO RED)                                     Popsicles (NO RED)                                                                  Juice: apple, WHITE grape, WHITE cranberry Sports drinks like Gatorade (NO RED)                The day of surgery:  Drink ONE (1)  G2 at 7:45  AM the morning of surgery. Drink in one sitting. Do not sip.  This drink was given to you during your hospital  pre-op appointment visit. Nothing else to drink after completing the   G2. At 8:00 AM          If you have questions, please contact your surgeon's office.      Oral Hygiene is also important to reduce your risk of infection.                                    Remember - BRUSH YOUR TEETH THE MORNING OF SURGERY WITH YOUR REGULAR  TOOTHPASTE  DENTURES WILL BE REMOVED PRIOR TO SURGERY PLEASE DO NOT APPLY "Poly grip" OR ADHESIVES!!!   Do NOT smoke after Midnight   Take these medicines the morning of surgery with A SIP OF WATER: Pain med if needed  Use inhalers and bring them with you                                                                                                                            Atorvastatin                                                                                                                            Propranolol                                                                                                                            Amlodipine                                                                                                                            Omeprazole How to Manage Your Diabetes Before and After Surgery  Why is it important to control my blood sugar before and after surgery? Improving blood sugar levels before and after surgery helps healing and can limit problems. A way of improving blood sugar control is eating a healthy diet by:  Eating less sugar and carbohydrates  Increasing activity/exercise  Talking with your doctor about reaching your blood sugar goals High blood sugars (greater than 180 mg/dL) can raise your risk of infections and slow your recovery, so you will need to focus on controlling your diabetes  during the weeks before surgery. Make sure that the doctor who takes care of your diabetes knows about your planned surgery including the date and location.  How do I manage my blood sugar before surgery? Check your blood sugar at least 4 times a day, starting 2 days before surgery, to make sure that the level is not too high or low. Check your blood sugar the morning of your surgery when you wake up and every 2 hours until you  get to the Short Stay unit. If your blood sugar is less than 70 mg/dL, you will need to treat for low blood sugar: Do not take insulin. Treat a low blood sugar (less than 70 mg/dL) with  cup of clear juice (cranberry or apple), 4 glucose tablets, OR glucose gel. Recheck blood sugar in 15 minutes after treatment (to make sure it is greater than 70 mg/dL). If your blood sugar is not greater than 70 mg/dL on recheck, call [phone] for further instructions. Report your blood sugar to the short stay nurse when you get to Short Stay.  If you are admitted to the hospital after surgery: Your blood sugar will be checked by the staff and you will probably be given insulin after surgery (instead of oral diabetes medicines) to make sure you have good blood sugar levels. The goal for blood sugar control after surgery is 80-180 mg/dL.   WHAT DO I DO ABOUT MY DIABETES MEDICATION? Stop taking the Victoza 7 days prior to day of surgery. Last dose                none after 2/29.       THE MORNING OF SURGERY, take 5   units of   Lantus  insulin.  DO NOT TAKE THE FOLLOWING 7 DAYS PRIOR TO SURGERY: Ozempic, Wegovy, Rybelsus (Semaglutide), Byetta (exenatide), Bydureon (exenatide ER), Victoza, Saxenda (liraglutide), or Trulicity (dulaglutide) Mounjaro (Tirzepatide) Adlyxin (Lixisenatide), Polyethylene Glycol Loxenatide.                                   You may not have any metal on your body including hair pins, jewelry, and body piercing             Do not wear make-up, lotions, powders, perfumes or deodorant  Do not wear nail polish including gel and S&S, artificial/acrylic nails, or any other type of covering on natural nails including finger and toenails. If you have artificial nails, gel coating, etc. that needs to be removed by a nail salon please have this removed prior to surgery or surgery may need to be canceled/ delayed if the surgeon/ anesthesia feels like they are unable to be safely  monitored.   Do not shave  48 hours prior to surgery.     Do not bring valuables to the hospital. [place].   Contacts, glasses, or bridgework may not be worn into surgery.   Someone must stay with you for 24 hours after surgery  DO NOT [place].       Special Instructions: Bring a copy of your healthcare power of attorney and living will documents  the day of surgery if you haven't scanned them before.              Please read over the  following fact sheets you were given: IF YOU HAVE QUESTIONS ABOUT YOUR PRE-OP INSTRUCTIONS PLEASE CALL [phone]    [facility] - Preparing for Surgery Before surgery, you can play an important role.  Because skin is not sterile, your skin needs to be as free of germs as possible.  You can reduce the number of germs on your skin by washing with CHG (chlorahexidine gluconate) soap before surgery.  CHG is an antiseptic cleaner which kills germs and bonds with the skin to continue killing germs even after washing. Please DO NOT use if you have an allergy to CHG or antibacterial soaps.  If your skin becomes reddened/irritated stop using the CHG and inform your nurse when you arrive at Short Stay. Do not shave (including legs and underarms) for at least 48 hours prior to the first CHG shower.   Please follow these instructions carefully:  1.  Shower with CHG Soap the night before surgery and the  morning of Surgery.  2.  If you choose to wash your hair, wash your hair first as usual with your  normal  shampoo.  3.  After you shampoo, rinse your hair and body thoroughly to remove the  shampoo.                            4.  Use CHG as you would any other liquid soap.  You can apply chg directly  to the skin and wash                       Gently with a scrungie or clean washcloth.  5.  Apply the CHG Soap to your body ONLY FROM THE NECK DOWN.   Do not use on face/ open                            Wound or open sores. Avoid contact with eyes, ears mouth and genitals (private parts).                       Wash face,  Genitals (private parts) with your normal soap.             6.  Wash thoroughly, paying special attention to the area where your surgery  will be performed.  7.  Thoroughly rinse your body with warm water from the neck down.  8.  DO NOT shower/wash with your normal soap after using and rinsing off  the CHG Soap.             9.  Pat yourself dry with a clean towel.            10.  Wear clean pajamas.            11.  Place clean sheets on your bed the night of your first shower and do not  sleep with pets. Day of Surgery : Do not apply any lotions/deodorants the morning of surgery.  Please wear clean clothes to the hospital/surgery center.  FAILURE TO FOLLOW THESE INSTRUCTIONS MAY RESULT IN THE CANCELLATION OF YOUR SURGERY   PATIENT SIGNATURE_________________________________  ________________________________________________________________________  Lori Ford  An incentive spirometer is a tool that can help keep your lungs clear and active. This tool measures how well you are filling your lungs with each breath. Taking long deep breaths may help reverse or decrease the chance of developing  breathing (pulmonary) problems (especially infection) following: A long period of time when you are unable to move or be active. BEFORE THE PROCEDURE  If the spirometer includes an indicator to show your best effort, your nurse or respiratory therapist will set it to a desired goal. If possible, sit up straight or lean slightly forward. Try not to slouch. Hold the incentive spirometer in an upright position. INSTRUCTIONS FOR USE  Sit on the edge of your bed if possible, or sit up as far as you can in bed or on a chair. Hold the incentive spirometer in an upright position. Breathe out normally. Place the mouthpiece in your mouth and seal your lips tightly  around it. Breathe in slowly and as deeply as possible, raising the piston or the ball toward the top of the column. Hold your breath for 3-5 seconds or for as long as possible. Allow the piston or ball to fall to the bottom of the column. Remove the mouthpiece from your mouth and breathe out normally. Rest for a few seconds and repeat Steps 1 through 7 at least 10 times every 1-2 hours when you are awake. Take your time and take a few normal breaths between deep breaths. The spirometer may include an indicator to show your best effort. Use the indicator as a goal to work toward during each repetition. After each set of 10 deep breaths, practice coughing to be sure your lungs are clear. If you have an incision (the cut made at the time of surgery), support your incision when coughing by placing a pillow or rolled up towels firmly against it. Once you are able to get out of bed, walk around indoors and cough well. You may stop using the incentive spirometer when instructed by your caregiver.  RISKS AND COMPLICATIONS Take your time so you do not get dizzy or light-headed. If you are in pain, you may need to take or ask for pain medication before doing incentive spirometry. It is harder to take a deep breath if you are having pain. AFTER USE Rest and breathe slowly and easily. It can be helpful to keep track of a log of your progress. Your caregiver can provide you with a simple table to help with this. If you are using the spirometer at home, follow these instructions: [place] IF:  You are having difficultly using the spirometer. You have trouble using the spirometer as often as instructed. Your pain medication is not giving enough relief while using the spirometer. You develop fever of 100.5 F (38.1 C) or higher. SEEK IMMEDIATE MEDICAL CARE IF:  You cough up bloody sputum that had not been present before. You develop fever of 102 F (38.9 C) or greater. You develop worsening pain  at or near the incision site. MAKE SURE YOU:  Understand these instructions. Will watch your condition. Will get help right away if you are not doing well or get worse. Document Released: 08/12/2006 Document Revised: 06/24/2011 Document Reviewed: 10/13/2006 [facility] Patient Information 2014 [place], [place].   ________________________________________________________________________

## 2022-06-14 ENCOUNTER — Encounter (HOSPITAL_COMMUNITY)
Admission: RE | Admit: 2022-06-14 | Discharge: 2022-06-14 | Disposition: A | Discharge status: Home / Self Care | Payer: Medicaid Other | Source: Ambulatory Visit | Attending: Orthopedic Surgery | Admitting: Orthopedic Surgery

## 2022-06-14 ENCOUNTER — Encounter (HOSPITAL_COMMUNITY): Payer: Self-pay

## 2022-06-14 ENCOUNTER — Other Ambulatory Visit: Payer: Self-pay

## 2022-06-14 VITALS — BP 151/86 | HR 72 | Temp 98.7°F | Resp 18 | Ht 64.0 in | Wt 213.0 lb

## 2022-06-14 DIAGNOSIS — E1169 Type 2 diabetes mellitus with other specified complication: Secondary | ICD-10-CM | POA: Diagnosis not present

## 2022-06-14 DIAGNOSIS — E669 Obesity, unspecified: Secondary | ICD-10-CM | POA: Diagnosis not present

## 2022-06-14 DIAGNOSIS — Z01818 Encounter for other preprocedural examination: Secondary | ICD-10-CM | POA: Diagnosis present

## 2022-06-14 HISTORY — DX: Chronic obstructive pulmonary disease, unspecified: J44.9

## 2022-06-14 LAB — BASIC METABOLIC PANEL
Anion gap: 9 (ref 5–15)
BUN: 14 mg/dL (ref 6–20)
CO2: 23 mmol/L (ref 22–32)
Calcium: 9.1 mg/dL (ref 8.9–10.3)
Chloride: 104 mmol/L (ref 98–111)
Creatinine, Ser: 0.96 mg/dL (ref 0.44–1.00)
GFR, Estimated: >60 mL/min (ref >60)
Glucose, Bld: 112 mg/dL — ABNORMAL HIGH (ref 70–99)
Potassium: 4 mmol/L (ref 3.5–5.1)
Sodium: 136 mmol/L (ref 135–145)

## 2022-06-14 LAB — CBC
HCT: 40.3 % (ref 36.0–46.0)
Hemoglobin: 13.1 g/dL (ref 12.0–15.0)
MCH: 30.7 pg (ref 26.0–34.0)
MCHC: 32.5 g/dL (ref 30.0–36.0)
MCV: 94.4 fL (ref 80.0–100.0)
Platelets: 257 10*3/uL (ref 150–400)
RBC: 4.27 MIL/uL (ref 3.87–5.11)
RDW: 14 % (ref 11.5–15.5)
WBC: 5.7 10*3/uL (ref 4.0–10.5)
nRBC: 0 % (ref 0.0–0.2)

## 2022-06-14 LAB — GLUCOSE, CAPILLARY: Glucose-Capillary: 123 mg/dL — ABNORMAL HIGH (ref 70–99)

## 2022-06-14 NOTE — Progress Notes (Signed)
Anesthesia note   PCP - Dr. Nolene Ebbs Cardiologist -none Other-   Chest x-ray - no EKG - 06/14/22-chart Stress Test - no ECHO - no Cardiac Cath - femoral 2017 CABG-no Pacemaker/ICD device last checked:no  Sleep Study - yes CPAP - yes   CBG at PAT visit-123 Fasting Blood Sugar at home-TID Checks Blood Sugar _[phone]____  Blood Thinner:no Blood Thinner Instructions: Aspirin Instructions: Last Dose:  Anesthesia review: Yes Lori Ford:  Patient denies shortness of breath, fever, cough and chest pain at PAT appointment. Pt reports no SOB with activities. She last had a seizure 6 months ago. She smokes and uses a CPAP. 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 Patient was also instructed that they will need to review over the PAT instructions again at home before surgery.yes

## 2022-06-15 LAB — HEMOGLOBIN A1C
Hgb A1c MFr Bld: 5.9 % — ABNORMAL HIGH (ref 4.8–5.6)
Mean Plasma Glucose: 123 mg/dL

## 2022-06-17 ENCOUNTER — Encounter (HOSPITAL_COMMUNITY): Payer: Self-pay | Admitting: Physician Assistant

## 2022-06-17 ENCOUNTER — Encounter (HOSPITAL_COMMUNITY): Payer: Self-pay | Admitting: Anesthesiology

## 2022-06-21 DIAGNOSIS — E1169 Type 2 diabetes mellitus with other specified complication: Secondary | ICD-10-CM

## 2022-06-21 DIAGNOSIS — Z01818 Encounter for other preprocedural examination: Secondary | ICD-10-CM

## 2022-06-25 ENCOUNTER — Other Ambulatory Visit (HOSPITAL_COMMUNITY): Payer: Self-pay

## 2022-06-25 ENCOUNTER — Ambulatory Visit (HOSPITAL_COMMUNITY): Payer: Self-pay

## 2022-06-27 ENCOUNTER — Ambulatory Visit (HOSPITAL_COMMUNITY): Payer: Self-pay

## 2022-06-28 ENCOUNTER — Other Ambulatory Visit (HOSPITAL_COMMUNITY): Payer: Self-pay

## 2022-06-29 ENCOUNTER — Ambulatory Visit (HOSPITAL_COMMUNITY): Payer: Self-pay

## 2022-06-30 ENCOUNTER — Ambulatory Visit (HOSPITAL_COMMUNITY)
Admission: EM | Admit: 2022-06-30 | Discharge: 2022-06-30 | Disposition: A | Discharge status: Home / Self Care | Payer: Medicaid Other | Attending: Emergency Medicine | Admitting: Emergency Medicine

## 2022-06-30 ENCOUNTER — Encounter (HOSPITAL_COMMUNITY): Payer: Self-pay

## 2022-06-30 DIAGNOSIS — J069 Acute upper respiratory infection, unspecified: Secondary | ICD-10-CM | POA: Diagnosis not present

## 2022-06-30 DIAGNOSIS — F17200 Nicotine dependence, unspecified, uncomplicated: Secondary | ICD-10-CM | POA: Diagnosis not present

## 2022-06-30 MED ORDER — AZITHROMYCIN 250 MG PO TABS: 250.0000 mg | ORAL_TABLET | Freq: Every day | ORAL | 0 refills | Status: DC

## 2022-06-30 MED ORDER — BENZONATATE 100 MG PO CAPS: 100.0000 mg | ORAL_CAPSULE | Freq: Three times a day (TID) | ORAL | 0 refills | Status: AC

## 2022-06-30 NOTE — ED Triage Notes (Signed)
Patient reports that she is having nasal congestion and sinus headache, itchy throat, eyes, and ears x 6 days.  Patient reports that she has taking Mucinex/sinus, Tylenol cold and sinus, hot tea, and cough drops with lemon.  Patient states she took Tylenol cold and sinus tabs at 0300 today.
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
## 2022-06-30 NOTE — Discharge Instructions (Addendum)
Rest, push fluids, may take over-the-counter meds for symptom management take antibiotic as directed.   Follow-up with PCP if not improved in 3 days.  Return to urgent care as needed

## 2022-07-01 ENCOUNTER — Ambulatory Visit (HOSPITAL_COMMUNITY): Payer: Self-pay

## 2022-07-09 ENCOUNTER — Ambulatory Visit (HOSPITAL_COMMUNITY): Payer: Medicaid Other

## 2022-07-10 ENCOUNTER — Ambulatory Visit: Payer: Medicaid Other | Admitting: Family

## 2022-07-10 ENCOUNTER — Ambulatory Visit (HOSPITAL_COMMUNITY): Payer: Self-pay

## 2022-07-11 ENCOUNTER — Encounter (HOSPITAL_COMMUNITY): Payer: Self-pay | Admitting: Orthopedic Surgery

## 2022-07-11 NOTE — Progress Notes (Addendum)
PCP -Docia Barrier , MD     Lori Ford  Cardiologist [facility] medical Saw once due to SOB sees Dr. Catalina Lunger. Now  ID-Cornelius Tommy Medal, MD Neuro LOV 10-04-21 epic Dr. Janace Litten  PPM/ICD -  Device Orders -  Rep Notified -   Chest x-ray - CT chest-05-15-22 epic EKG - 06-14-22 epic Stress Test -  ECHO -  Cardiac Cath -  HgbA1c-06-14-22 epic 5.9  Sleep Study -  CPAP -   Fasting Blood Sugar - 119 Checks Blood Sugar __3___ times a day  Blood Thinner Instructions: Aspirin Instructions:  ERAS Protcol - PRE-SURGERY  G2- no drink pt. Allergic to grape   COVID vaccine -x3  Activity--Able to complete ADL'S without SOB Anesthesia review: Asthma, COPD, bronchitis, DVT, HIV, DM type 2, seizure , murmur pt. Refused for Lori Billow PA to listen to her murmur, No recent echo  Patient denies shortness of breath, fever, cough and chest pain at PAT appointment   All instructions explained to the patient, with a verbal understanding of the material. Patient agrees to go over the instructions while at home for a better understanding. Patient also instructed to self quarantine after being tested for COVID-19. The opportunity to ask questions was provided.

## 2022-07-11 NOTE — Patient Instructions (Addendum)
DUE TO COVID-19 ONLY TWO VISITORS  (aged 45 and older)  ARE ALLOWED TO COME WITH YOU AND STAY IN THE WAITING ROOM ONLY DURING PRE OP AND PROCEDURE.    IF YOU WILL BE ADMITTED INTO THE HOSPITAL YOU ARE ALLOWED ONLY FOUR SUPPORT PEOPLE DURING VISITATION HOURS ONLY (7 AM -8PM)   The support person(s) must pass our screening, gel in and out, and wear a mask at all times, including in the patient's room. Patients must also wear a mask when staff or their support person are in the room. Visitors GUEST BADGE MUST BE WORN VISIBLY  One adult visitor may remain with you overnight and MUST be in the room by 8 P.M.     Your procedure is scheduled on: 07-26-22   Report to [facility] Main Entrance    Report to admitting at    0730  AM   Call this number if you have problems the morning of surgery [phone]   Do not eat food :After Midnight.   After Midnight you may have the following liquids until 0645 _AM/ DAY OF SURGERY  Water Black Coffee (sugar ok, NO MILK/CREAM OR CREAMERS)  Tea (sugar ok, NO MILK/CREAM OR CREAMERS) regular and decaf                             Plain Jell-O (NO RED)                                           Fruit ices (not with fruit pulp, NO RED)                                     Popsicles (NO RED)                                                                  Juice: apple, WHITE grape, WHITE cranberry Sports drinks like Gatorade (NO RED)                The day of surgery:  Drink ONE (1)  G2 at 0630  AM the morning of surgery. Drink in one sitting. Do not sip.  This drink was given to you during your hospital  pre-op appointment visit. Nothing else to drink after completing the   G2. BY  0645 AM          If you have questions, please contact your surgeon's office.      Oral Hygiene is also important to reduce your risk of infection.                                    Remember - BRUSH YOUR TEETH THE MORNING OF SURGERY WITH YOUR REGULAR  TOOTHPASTE  DENTURES WILL BE REMOVED PRIOR TO SURGERY PLEASE DO NOT APPLY "Poly grip" OR ADHESIVES!!!   Do NOT smoke after Midnight   Take these medicines the morning of surgery with A SIP OF WATER: Pain med  if needed, Biktarvy                                                                                                                           Use inhalers and bring them with you                                                                                                                            Atorvastatin                                                                                                                            Propranolol                                                                                                                            Amlodipine  Omeprazole How to Manage Your Diabetes Before and After Surgery  Why is it important to control my blood sugar before and after surgery? Improving blood sugar levels before and after surgery helps healing and can limit problems. A way of improving blood sugar control is eating a healthy diet by:  Eating less sugar and carbohydrates  Increasing activity/exercise  Talking with your doctor about reaching your blood sugar goals High blood sugars (greater than 180 mg/dL) can raise your risk of infections and slow your recovery, so you will need to focus on controlling your diabetes during the weeks before surgery. Make sure that the doctor who takes care of your diabetes knows about your planned surgery including the date and location.  How do I manage my blood sugar before surgery? Check your blood sugar at least 4 times a day, starting 2 days before surgery, to make sure that the level is not too high or low. Check your blood sugar the morning of your surgery when you wake up and every 2 hours  until you get to the Short Stay unit. If your blood sugar is less than 70 mg/dL, you will need to treat for low blood sugar: Do not take insulin. Treat a low blood sugar (less than 70 mg/dL) with  cup of clear juice (cranberry or apple), 4 glucose tablets, OR glucose gel. Recheck blood sugar in 15 minutes after treatment (to make sure it is greater than 70 mg/dL). If your blood sugar is not greater than 70 mg/dL on recheck, call [phone] for further instructions. Report your blood sugar to the short stay nurse when you get to Short Stay.  If you are admitted to the hospital after surgery: Your blood sugar will be checked by the staff and you will probably be given insulin after surgery (instead of oral diabetes medicines) to make sure you have good blood sugar levels. The goal for blood sugar control after surgery is 80-180 mg/dL.   WHAT DO I DO ABOUT MY DIABETES MEDICATION?  Stop taking the Victoza  24 hours prior to prior to day of surgery.        THE MORNING OF SURGERY, take 5   units of   Lantus  insulin.  That's 50% of your  normal dose of 10 units  DO NOT TAKE THE FOLLOWING 7 DAYS PRIOR TO SURGERY: Ozempic, Wegovy, Rybelsus (Semaglutide), Byetta (exenatide), Bydureon (exenatide ER), Victoza, Saxenda (liraglutide), or Trulicity (dulaglutide) Mounjaro (Tirzepatide) Adlyxin (Lixisenatide), Polyethylene Glycol Loxenatide.                               You may not have any metal on your body including hair pins, jewelry, and body piercing             Do not wear make-up, lotions, powders, perfumes or deodorant  Do not wear nail polish including gel and S&S, artificial/acrylic nails, or any other type of covering on natural nails including finger and toenails. If you have artificial nails, gel coating, etc. that needs to be removed by a nail salon please have this removed prior to surgery or surgery may need to be canceled/ delayed if the surgeon/ anesthesia feels like they are unable  to be safely monitored.   Do not shave  48 hours prior to surgery.   BRING CPAP MASK AND TUBING   Do not bring valuables to the hospital. [place]  RESPONSIBLE   FOR VALUABLES.   Contacts, glasses, or bridgework may not be worn into surgery.   Someone must stay with you for 24 hours after surgery  DO NOT [place].       Special Instructions: Bring a copy of your healthcare power of attorney and living will documents  the day of surgery if you haven't scanned them before.              Please read over the following fact sheets you were given: IF YOU HAVE QUESTIONS ABOUT YOUR PRE-OP INSTRUCTIONS PLEASE CALL [phone]    [facility] - Preparing for Surgery Before surgery, you can play an important role.  Because skin is not sterile, your skin needs to be as free of germs as possible.  You can reduce the number of germs on your skin by washing with CHG (chlorahexidine gluconate) soap before surgery.  CHG is an antiseptic cleaner which kills germs and bonds with the skin to continue killing germs even after washing. Please DO NOT use if you have an allergy to CHG or antibacterial soaps.  If your skin becomes reddened/irritated stop using the CHG and inform your nurse when you arrive at Short Stay. Do not shave (including legs and underarms) for at least 48 hours prior to the first CHG shower.   Please follow these instructions carefully:  1.  Shower with CHG Soap the night before surgery and the  morning of Surgery.  2.  If you choose to wash your hair, wash your hair first as usual with your  normal  shampoo.  3.  After you shampoo, rinse your hair and body thoroughly to remove the  shampoo.                            4.  Use CHG as you would any other liquid soap.  You can apply chg directly  to the skin and wash                       Gently with a scrungie or clean washcloth.  5.  Apply the CHG Soap to your body ONLY FROM THE  NECK DOWN.   Do not use on face/ open                           Wound or open sores. Avoid contact with eyes, ears mouth and genitals (private parts).                       Wash face,  Genitals (private parts) with your normal soap.             6.  Wash thoroughly, paying special attention to the area where your surgery  will be performed.  7.  Thoroughly rinse your body with warm water from the neck down.  8.  DO NOT shower/wash with your normal soap after using and rinsing off  the CHG Soap.             9.  Pat yourself dry with a clean towel.            10.  Wear clean pajamas.            11.  Place clean sheets on your bed the night of your first shower and do not  sleep with  pets. Day of Surgery : Do not apply any lotions/deodorants the morning of surgery.  Please wear clean clothes to the hospital/surgery center.  FAILURE TO FOLLOW THESE INSTRUCTIONS MAY RESULT IN THE CANCELLATION OF YOUR SURGERY   PATIENT SIGNATURE_________________________________  ________________________________________________________________________  Adam Phenix  An incentive spirometer is a tool that can help keep your lungs clear and active. This tool measures how well you are filling your lungs with each breath. Taking long deep breaths may help reverse or decrease the chance of developing breathing (pulmonary) problems (especially infection) following: A long period of time when you are unable to move or be active. BEFORE THE PROCEDURE  If the spirometer includes an indicator to show your best effort, your nurse or respiratory therapist will set it to a desired goal. If possible, sit up straight or lean slightly forward. Try not to slouch. Hold the incentive spirometer in an upright position. INSTRUCTIONS FOR USE  Sit on the edge of your bed if possible, or sit up as far as you can in bed or on a chair. Hold the incentive spirometer in an upright position. Breathe out normally. Place the  mouthpiece in your mouth and seal your lips tightly around it. Breathe in slowly and as deeply as possible, raising the piston or the ball toward the top of the column. Hold your breath for 3-5 seconds or for as long as possible. Allow the piston or ball to fall to the bottom of the column. Remove the mouthpiece from your mouth and breathe out normally. Rest for a few seconds and repeat Steps 1 through 7 at least 10 times every 1-2 hours when you are awake. Take your time and take a few normal breaths between deep breaths. The spirometer may include an indicator to show your best effort. Use the indicator as a goal to work toward during each repetition. After each set of 10 deep breaths, practice coughing to be sure your lungs are clear. If you have an incision (the cut made at the time of surgery), support your incision when coughing by placing a pillow or rolled up towels firmly against it. Once you are able to get out of bed, walk around indoors and cough well. You may stop using the incentive spirometer when instructed by your caregiver.  RISKS AND COMPLICATIONS Take your time so you do not get dizzy or light-headed. If you are in pain, you may need to take or ask for pain medication before doing incentive spirometry. It is harder to take a deep breath if you are having pain. AFTER USE Rest and breathe slowly and easily. It can be helpful to keep track of a log of your progress. Your caregiver can provide you with a simple table to help with this. If you are using the spirometer at home, follow these instructions: [place] IF:  You are having difficultly using the spirometer. You have trouble using the spirometer as often as instructed. Your pain medication is not giving enough relief while using the spirometer. You develop fever of 100.5 F (38.1 C) or higher. SEEK IMMEDIATE MEDICAL CARE IF:  You cough up bloody sputum that had not been present before. You develop fever of 102 F  (38.9 C) or greater. You develop worsening pain at or near the incision site. MAKE SURE YOU:  Understand these instructions. Will watch your condition. Will get help right away if you are not doing well or get worse. Document Released: 08/12/2006 Document Revised: 06/24/2011 Document Reviewed: 10/13/2006  Exit[employer] Patient Information [address], [place].   ________________________________________________________________________

## 2022-07-15 ENCOUNTER — Telehealth: Payer: Self-pay | Admitting: Pulmonary Disease

## 2022-07-15 ENCOUNTER — Other Ambulatory Visit: Payer: Self-pay

## 2022-07-15 ENCOUNTER — Encounter (HOSPITAL_COMMUNITY): Payer: Self-pay

## 2022-07-15 ENCOUNTER — Encounter (HOSPITAL_COMMUNITY)
Admission: RE | Admit: 2022-07-15 | Discharge: 2022-07-15 | Disposition: A | Discharge status: Home / Self Care | Payer: Medicaid Other | Source: Ambulatory Visit | Attending: Orthopedic Surgery | Admitting: Orthopedic Surgery

## 2022-07-15 VITALS — BP 148/99 | HR 71 | Temp 98.8°F | Resp 16 | Ht 64.0 in | Wt 225.0 lb

## 2022-07-15 DIAGNOSIS — E669 Obesity, unspecified: Secondary | ICD-10-CM | POA: Insufficient documentation

## 2022-07-15 DIAGNOSIS — Z01818 Encounter for other preprocedural examination: Secondary | ICD-10-CM

## 2022-07-15 DIAGNOSIS — I10 Essential (primary) hypertension: Secondary | ICD-10-CM

## 2022-07-15 DIAGNOSIS — Z01812 Encounter for preprocedural laboratory examination: Secondary | ICD-10-CM | POA: Insufficient documentation

## 2022-07-15 DIAGNOSIS — E1169 Type 2 diabetes mellitus with other specified complication: Secondary | ICD-10-CM | POA: Insufficient documentation

## 2022-07-15 HISTORY — DX: Endometriosis, unspecified: N80.9

## 2022-07-15 HISTORY — DX: Sleep apnea, unspecified: G47.30

## 2022-07-15 HISTORY — DX: Cerebral infarction, unspecified: I63.9

## 2022-07-15 HISTORY — DX: Anxiety disorder, unspecified: F41.9

## 2022-07-15 HISTORY — DX: Cardiac murmur, unspecified: R01.1

## 2022-07-15 HISTORY — DX: Pneumonia, unspecified organism: J18.9

## 2022-07-15 HISTORY — DX: Anemia, unspecified: D64.9

## 2022-07-15 HISTORY — DX: Depression, unspecified: F32.A

## 2022-07-15 LAB — GLUCOSE, CAPILLARY: Glucose-Capillary: 123 mg/dL — ABNORMAL HIGH (ref 70–99)

## 2022-07-15 LAB — CBC
HCT: 39.7 % (ref 36.0–46.0)
Hemoglobin: 12.9 g/dL (ref 12.0–15.0)
MCH: 30.6 pg (ref 26.0–34.0)
MCHC: 32.5 g/dL (ref 30.0–36.0)
MCV: 94.3 fL (ref 80.0–100.0)
Platelets: 222 10*3/uL (ref 150–400)
RBC: 4.21 MIL/uL (ref 3.87–5.11)
RDW: 14.1 % (ref 11.5–15.5)
WBC: 7.4 10*3/uL (ref 4.0–10.5)
nRBC: 0 % (ref 0.0–0.2)

## 2022-07-15 LAB — BASIC METABOLIC PANEL
Anion gap: 5 (ref 5–15)
BUN: 13 mg/dL (ref 6–20)
CO2: 24 mmol/L (ref 22–32)
Calcium: 8.8 mg/dL — ABNORMAL LOW (ref 8.9–10.3)
Chloride: 106 mmol/L (ref 98–111)
Creatinine, Ser: 1 mg/dL (ref 0.44–1.00)
GFR, Estimated: >60 mL/min (ref >60)
Glucose, Bld: 92 mg/dL (ref 70–99)
Potassium: 4 mmol/L (ref 3.5–5.1)
Sodium: 135 mmol/L (ref 135–145)

## 2022-07-15 NOTE — Telephone Encounter (Signed)
Spoke to a nurse from [facility] and I informed nurse pt did not have a echo done. Nurse verbalized understanding. Nothing further needed.

## 2022-07-15 NOTE — Telephone Encounter (Signed)
Pt told nurse at [facility] that she had a echocardiogram already in the summer, but there is no record of it in epic. Pls advise

## 2022-07-15 NOTE — Progress Notes (Signed)
No changes in med history since last done 06-12-22

## 2022-07-16 ENCOUNTER — Ambulatory Visit: Payer: Medicaid Other

## 2022-07-25 NOTE — Progress Notes (Signed)
Left message for arrival time to short stay 0900 07/26/22, no answer.

## 2022-07-26 ENCOUNTER — Ambulatory Visit (HOSPITAL_COMMUNITY): Admission: RE | Admit: 2022-07-26 | Payer: Medicaid Other | Source: Home / Self Care | Admitting: Orthopedic Surgery

## 2022-07-26 ENCOUNTER — Encounter (HOSPITAL_COMMUNITY): Admission: RE | Payer: Self-pay | Source: Home / Self Care

## 2022-07-26 ENCOUNTER — Encounter: Payer: Self-pay | Admitting: Family

## 2022-07-26 DIAGNOSIS — E1169 Type 2 diabetes mellitus with other specified complication: Secondary | ICD-10-CM

## 2022-07-26 DIAGNOSIS — Z01818 Encounter for other preprocedural examination: Secondary | ICD-10-CM

## 2022-07-26 DIAGNOSIS — I10 Essential (primary) hypertension: Secondary | ICD-10-CM

## 2022-07-26 DIAGNOSIS — E669 Obesity, unspecified: Secondary | ICD-10-CM

## 2022-07-26 SURGERY — KNEE ARTHROSCOPY WITH ANTERIOR CRUCIATE LIGAMENT (ACL) RECONSTRUCTION WITH HAMSTRING GRAFT
Anesthesia: Choice | Laterality: Right

## 2022-07-26 NOTE — Progress Notes (Signed)
Patients husband called [facility] this morning to let us know that the patient will not come in for surgery today because she has been having seizures all night.  Husband stated that he will call Dr Aundria Rud office to get her rescheduled.    OR Desk notified of this information.  Evern Bio BSN, Radio producer - Perioperative Services [place] [phone]

## 2022-07-30 ENCOUNTER — Ambulatory Visit: Payer: Medicaid Other

## 2022-08-08 ENCOUNTER — Ambulatory Visit (HOSPITAL_COMMUNITY): Payer: Self-pay

## 2022-08-28 ENCOUNTER — Other Ambulatory Visit: Payer: Self-pay

## 2022-08-28 ENCOUNTER — Emergency Department (HOSPITAL_COMMUNITY): Payer: Medicaid Other

## 2022-08-28 ENCOUNTER — Emergency Department (HOSPITAL_COMMUNITY)
Admission: EM | Admit: 2022-08-28 | Discharge: 2022-08-29 | Disposition: A | Discharge status: Home / Self Care | Payer: Medicaid Other | Attending: Emergency Medicine | Admitting: Emergency Medicine

## 2022-08-28 ENCOUNTER — Encounter (HOSPITAL_COMMUNITY): Payer: Self-pay

## 2022-08-28 ENCOUNTER — Ambulatory Visit (HOSPITAL_COMMUNITY)
Admission: EM | Admit: 2022-08-28 | Discharge: 2022-08-28 | Disposition: A | Discharge status: Home / Self Care | Payer: Medicaid Other

## 2022-08-28 DIAGNOSIS — E119 Type 2 diabetes mellitus without complications: Secondary | ICD-10-CM | POA: Insufficient documentation

## 2022-08-28 DIAGNOSIS — J4489 Other specified chronic obstructive pulmonary disease: Secondary | ICD-10-CM | POA: Insufficient documentation

## 2022-08-28 DIAGNOSIS — Z91040 Latex allergy status: Secondary | ICD-10-CM | POA: Insufficient documentation

## 2022-08-28 DIAGNOSIS — Z794 Long term (current) use of insulin: Secondary | ICD-10-CM | POA: Insufficient documentation

## 2022-08-28 DIAGNOSIS — Z5321 Procedure and treatment not carried out due to patient leaving prior to being seen by health care provider: Secondary | ICD-10-CM | POA: Diagnosis not present

## 2022-08-28 DIAGNOSIS — R1033 Periumbilical pain: Secondary | ICD-10-CM

## 2022-08-28 DIAGNOSIS — Z79899 Other long term (current) drug therapy: Secondary | ICD-10-CM | POA: Diagnosis not present

## 2022-08-28 DIAGNOSIS — R109 Unspecified abdominal pain: Secondary | ICD-10-CM | POA: Diagnosis present

## 2022-08-28 DIAGNOSIS — I10 Essential (primary) hypertension: Secondary | ICD-10-CM | POA: Diagnosis not present

## 2022-08-28 DIAGNOSIS — K429 Umbilical hernia without obstruction or gangrene: Secondary | ICD-10-CM | POA: Insufficient documentation

## 2022-08-28 LAB — COMPREHENSIVE METABOLIC PANEL
ALT: 14 U/L (ref 0–44)
AST: 17 U/L (ref 15–41)
Albumin: 3.9 g/dL (ref 3.5–5.0)
Alkaline Phosphatase: 64 U/L (ref 38–126)
Anion gap: 9 (ref 5–15)
BUN: 13 mg/dL (ref 6–20)
CO2: 20 mmol/L — ABNORMAL LOW (ref 22–32)
Calcium: 9 mg/dL (ref 8.9–10.3)
Chloride: 107 mmol/L (ref 98–111)
Creatinine, Ser: 0.82 mg/dL (ref 0.44–1.00)
GFR, Estimated: >60 mL/min (ref >60)
Glucose, Bld: 133 mg/dL — ABNORMAL HIGH (ref 70–99)
Potassium: 4.1 mmol/L (ref 3.5–5.1)
Sodium: 136 mmol/L (ref 135–145)
Total Bilirubin: 0.4 mg/dL (ref 0.3–1.2)
Total Protein: 7.4 g/dL (ref 6.5–8.1)

## 2022-08-28 LAB — URINALYSIS, ROUTINE W REFLEX MICROSCOPIC
Bilirubin Urine: NEGATIVE
Glucose, UA: NEGATIVE mg/dL
Ketones, ur: NEGATIVE mg/dL
Leukocytes,Ua: NEGATIVE
Nitrite: NEGATIVE
Protein, ur: NEGATIVE mg/dL
Specific Gravity, Urine: 1.025 (ref 1.005–1.030)
pH: 5 (ref 5.0–8.0)

## 2022-08-28 LAB — CBC
HCT: 39.4 % (ref 36.0–46.0)
Hemoglobin: 13 g/dL (ref 12.0–15.0)
MCH: 30.2 pg (ref 26.0–34.0)
MCHC: 33 g/dL (ref 30.0–36.0)
MCV: 91.4 fL (ref 80.0–100.0)
Platelets: 241 10*3/uL (ref 150–400)
RBC: 4.31 MIL/uL (ref 3.87–5.11)
RDW: 14.3 % (ref 11.5–15.5)
WBC: 6.1 10*3/uL (ref 4.0–10.5)
nRBC: 0 % (ref 0.0–0.2)

## 2022-08-28 LAB — LIPASE, BLOOD: Lipase: 35 U/L (ref 11–51)

## 2022-08-28 LAB — I-STAT CHEM 8, ED
BUN: 14 mg/dL (ref 6–20)
Calcium, Ion: 1.16 mmol/L (ref 1.15–1.40)
Chloride: 106 mmol/L (ref 98–111)
Creatinine, Ser: 0.7 mg/dL (ref 0.44–1.00)
Glucose, Bld: 137 mg/dL — ABNORMAL HIGH (ref 70–99)
HCT: 40 % (ref 36.0–46.0)
Hemoglobin: 13.6 g/dL (ref 12.0–15.0)
Potassium: 4.1 mmol/L (ref 3.5–5.1)
Sodium: 139 mmol/L (ref 135–145)
TCO2: 19 mmol/L — ABNORMAL LOW (ref 22–32)

## 2022-08-28 LAB — I-STAT BETA HCG BLOOD, ED (MC, WL, AP ONLY): I-stat hCG, quantitative: <5 m[IU]/mL (ref <5)

## 2022-08-28 MED ORDER — IOHEXOL 350 MG/ML SOLN
75.0000 mL | Freq: Once | INTRAVENOUS | Status: AC | PRN
  Administered 2022-08-28: 75 mL via INTRAVENOUS
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
## 2022-08-28 NOTE — ED Notes (Signed)
Patient is being discharged from the Urgent Care and sent to the Emergency Department via POV. Per [place] Garrison, NP, patient is in need of higher level of care due to abdominal pain. Patient is aware and verbalizes understanding of plan of care.  Vitals:   08/28/22 1435  BP: (!) 148/91  Pulse: 70  Resp: 16  Temp: 98.5 F (36.9 C)  SpO2: 97%

## 2022-08-28 NOTE — ED Notes (Signed)
Pt name called 3x no answer   

## 2022-08-28 NOTE — ED Triage Notes (Signed)
Pt arrives with c/o ABD pain for about 4 months. Per pt, she has a hernia and has been treated by PCP with antibiotics without relief. Per pt, her hernia is bleeding and the pain has gotten worse over the last month.

## 2022-08-28 NOTE — ED Triage Notes (Signed)
Patient reports that she has an umbilical hernia and has had x 4 months. Patient states she saw her PCP a month ago and was prescribed antibiotics. Patient states she saw the PCP again on 5/58/24 and was prescribed more antibiotics. Patient states the pain has worsened and she was told that the hernia was bleeding on the inside. Patient also reports the pain is worse after eating and states the pain is now radiating into the back.

## 2022-08-29 ENCOUNTER — Encounter (HOSPITAL_COMMUNITY): Payer: Self-pay

## 2022-08-29 ENCOUNTER — Other Ambulatory Visit: Payer: Self-pay

## 2022-08-29 ENCOUNTER — Emergency Department (HOSPITAL_COMMUNITY)
Admission: EM | Admit: 2022-08-29 | Discharge: 2022-08-29 | Disposition: A | Discharge status: Home / Self Care | Payer: Medicaid Other | Source: Home / Self Care | Attending: Emergency Medicine | Admitting: Emergency Medicine

## 2022-08-29 DIAGNOSIS — E119 Type 2 diabetes mellitus without complications: Secondary | ICD-10-CM | POA: Insufficient documentation

## 2022-08-29 DIAGNOSIS — K429 Umbilical hernia without obstruction or gangrene: Secondary | ICD-10-CM | POA: Insufficient documentation

## 2022-08-29 DIAGNOSIS — J45909 Unspecified asthma, uncomplicated: Secondary | ICD-10-CM | POA: Insufficient documentation

## 2022-08-29 DIAGNOSIS — Z794 Long term (current) use of insulin: Secondary | ICD-10-CM | POA: Insufficient documentation

## 2022-08-29 DIAGNOSIS — K439 Ventral hernia without obstruction or gangrene: Secondary | ICD-10-CM

## 2022-08-29 DIAGNOSIS — J449 Chronic obstructive pulmonary disease, unspecified: Secondary | ICD-10-CM | POA: Insufficient documentation

## 2022-08-29 DIAGNOSIS — Z79899 Other long term (current) drug therapy: Secondary | ICD-10-CM | POA: Insufficient documentation

## 2022-08-29 DIAGNOSIS — I10 Essential (primary) hypertension: Secondary | ICD-10-CM | POA: Insufficient documentation

## 2022-08-29 DIAGNOSIS — Z91040 Latex allergy status: Secondary | ICD-10-CM | POA: Insufficient documentation

## 2022-08-29 LAB — COMPREHENSIVE METABOLIC PANEL
ALT: 12 U/L (ref 0–44)
AST: 14 U/L — ABNORMAL LOW (ref 15–41)
Albumin: 3.7 g/dL (ref 3.5–5.0)
Alkaline Phosphatase: 59 U/L (ref 38–126)
Anion gap: 10 (ref 5–15)
BUN: 12 mg/dL (ref 6–20)
CO2: 21 mmol/L — ABNORMAL LOW (ref 22–32)
Calcium: 9 mg/dL (ref 8.9–10.3)
Chloride: 106 mmol/L (ref 98–111)
Creatinine, Ser: 0.96 mg/dL (ref 0.44–1.00)
GFR, Estimated: >60 mL/min (ref >60)
Glucose, Bld: 97 mg/dL (ref 70–99)
Potassium: 3.9 mmol/L (ref 3.5–5.1)
Sodium: 137 mmol/L (ref 135–145)
Total Bilirubin: 0.4 mg/dL (ref 0.3–1.2)
Total Protein: 6.8 g/dL (ref 6.5–8.1)

## 2022-08-29 LAB — CBC WITH DIFFERENTIAL/PLATELET
Abs Immature Granulocytes: 0.01 10*3/uL (ref 0.00–0.07)
Basophils Absolute: 0 10*3/uL (ref 0.0–0.1)
Basophils Relative: 1 %
Eosinophils Absolute: 0 10*3/uL (ref 0.0–0.5)
Eosinophils Relative: 0 %
HCT: 37.1 % (ref 36.0–46.0)
Hemoglobin: 12 g/dL (ref 12.0–15.0)
Immature Granulocytes: 0 %
Lymphocytes Relative: 54 %
Lymphs Abs: 3 10*3/uL (ref 0.7–4.0)
MCH: 29.5 pg (ref 26.0–34.0)
MCHC: 32.3 g/dL (ref 30.0–36.0)
MCV: 91.2 fL (ref 80.0–100.0)
Monocytes Absolute: 0.4 10*3/uL (ref 0.1–1.0)
Monocytes Relative: 8 %
Neutro Abs: 2.1 10*3/uL (ref 1.7–7.7)
Neutrophils Relative %: 37 %
Platelets: 210 10*3/uL (ref 150–400)
RBC: 4.07 MIL/uL (ref 3.87–5.11)
RDW: 14.2 % (ref 11.5–15.5)
WBC: 5.5 10*3/uL (ref 4.0–10.5)
nRBC: 0 % (ref 0.0–0.2)

## 2022-08-29 LAB — I-STAT BETA HCG BLOOD, ED (MC, WL, AP ONLY): I-stat hCG, quantitative: <5 m[IU]/mL (ref <5)

## 2022-08-29 LAB — LIPASE, BLOOD: Lipase: 32 U/L (ref 11–51)

## 2022-08-29 MED ORDER — SODIUM CHLORIDE 0.9 % IV SOLN: 12.5000 mg | Freq: Once | INTRAVENOUS | Status: DC | PRN

## 2022-08-29 MED ORDER — PROMETHAZINE HCL 12.5 MG PO TABS: 12.5000 mg | ORAL_TABLET | Freq: Four times a day (QID) | ORAL | 0 refills | Status: DC | PRN

## 2022-08-29 MED ORDER — MORPHINE SULFATE (PF) 4 MG/ML IV SOLN
4.0000 mg | Freq: Once | INTRAVENOUS | Status: AC
  Administered 2022-08-29: 4 mg via INTRAVENOUS
  Filled 2022-08-29: qty 1
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
## 2022-08-29 NOTE — ED Provider Notes (Signed)
EMERGENCY DEPARTMENT AT [facility] Provider Note   CSN: [id number] Arrival date & time: 08/29/22  4098     History  Chief Complaint  Patient presents with   Umbilical Hernia    Itati Scheurer is a 47 y.o. female with extensive past medical history as outlined below presents to the ED complaining of an umbilical hernia for the past 6 months.  Patient states this area has become painful and she has had nausea and vomiting for the past 2 weeks.  Patient has been seen by her family medicine doctor with [facility] a couple of times for increased pain around her hernia and was placed on doxycycline.  She reports that the hernia was protruding more but has improved since the first round of doxycycline.  Patient was evaluated at urgent care yesterday for same and came to ED for further evaluation upon urgent care recommendation but LWBS.  Denies diarrhea, constipation, blood in her stool, fever, chills.     Past Medical History:  Diagnosis Date   Allergic rhinitis    Anemia    Anxiety    Arthritis    Asthma    as child   Brain tumor (benign) (HCC)    Chronic back pain    Cigarette nicotine dependence with withdrawal    COPD (chronic obstructive pulmonary disease) (HCC)    Depression    Diabetes mellitus without complication (HCC)    DVT (deep venous thrombosis) (HCC)    Endometriosis    Fibromyalgia    Gallstones    s/p cholecystectomy   GERD (gastroesophageal reflux disease)    Heart murmur    no issues   HIV (human immunodeficiency virus infection) (HCC)    HTN (hypertension)    Meningioma (HCC)    Migraine    Pneumonia    Sciatic pain    Seizure disorder, grand mal (HCC)    dx 2005   Seizures (HCC)    due to head trauma as adult   Sleep apnea    wears cpap   Stroke (HCC)    2014 no residual        Home Medications Prior to Admission medications   Medication Sig Start Date End Date Taking? Authorizing Provider  promethazine (PHENERGAN)  12.5 MG tablet Take 1 tablet (12.5 mg total) by mouth every 6 (six) hours as needed for nausea or vomiting. 08/29/22  Yes Pailynn Vahey R, PA-C  Accu-Chek Softclix Lancets lancets 1 each by Other route 4 (four) times daily. Use as instructed 12/12/21 10/08/22  Theadora Rama Scales, PA-C  acetaminophen (TYLENOL) 500 MG tablet Take 2 tablets (1,000 mg total) by mouth every 6 (six) hours as needed. Patient not taking: Reported on 06/12/2022 10/05/21   Carlisle Beers, FNP  albuterol (PROVENTIL) (2.5 MG/3ML) 0.083% nebulizer solution Take 3 mLs (2.5 mg total) by nebulization every 4 (four) hours as needed for wheezing or shortness of breath. 10/05/21   Carlisle Beers, FNP  amLODipine (NORVASC) 10 MG tablet TAKE 1 TABLET(10 MG) BY MOUTH DAILY Patient not taking: Reported on 06/12/2022 02/14/20   Fulp, Cammie, MD  amLODipine (NORVASC) 5 MG tablet Take 5 mg by mouth daily.    [provider]  atorvastatin (LIPITOR) 20 MG tablet Take 1 tablet (20 mg total) by mouth at bedtime. 12/11/21 06/12/22  Theadora Rama Scales, PA-C  azithromycin (ZITHROMAX) 250 MG tablet Take 1 tablet (250 mg total) by mouth daily. Take first 2 tablets together, then 1 every day until  finished. 06/30/22   Defelice, Para March, NP  Baclofen 5 MG TABS Take 5 mg by mouth at bedtime.    [provider]  bictegravir-emtricitabine-tenofovir AF (BIKTARVY) 50-200-25 MG TABS tablet Take 1 tablet by mouth daily. 03/19/22   Jennette Kettle, RPH-CPP  Blood Pressure Monitor DEVI 1 Units by Does not apply route 3 (three) times daily. 09/08/18   Grayce Sessions, NP  budesonide-formoterol (SYMBICORT) 160-4.5 MCG/ACT inhaler Inhale 2 puffs into the lungs 2 (two) times daily. 04/12/22 10/09/22  Lurline Idol, FNP  cephALEXin (KEFLEX) 500 MG capsule Take 1 capsule (500 mg total) by mouth 4 (four) times daily. Patient not taking: Reported on 06/12/2022 05/15/22   Zenia Resides, MD  clotrimazole-betamethasone (LOTRISONE)  cream Apply 1 Application topically in the morning, at noon, and at bedtime. 05/19/22   [provider]  enoxaparin (LOVENOX) 40 MG/0.4ML injection Inject 0.4 mLs (40 mg total) into the skin daily. Patient not taking: Reported on 04/05/2022 10/31/21   Naida Sleight, PA-C  Erenumab-aooe 70 MG/ML SOAJ Inject 70 mg into the skin every 30 (thirty) days.  02/02/19   [provider]  famotidine (PEPCID) 40 MG tablet Take 1 tablet (40 mg total) by mouth at bedtime. 12/11/21 06/12/22  Theadora Rama Scales, PA-C  fluticasone (FLONASE) 50 MCG/ACT nasal spray Place 2 sprays into both nostrils daily. Patient taking differently: Place 2 sprays into both nostrils daily as needed for allergies. 10/10/21   Ellsworth Lennox, PA-C  fluticasone (FLOVENT HFA) 220 MCG/ACT inhaler Inhale 1 puff into the lungs in the morning and at bedtime.    [provider]  hydrochlorothiazide (HYDRODIURIL) 25 MG tablet TAKE 1 TABLET(25 MG) BY MOUTH DAILY 12/11/21   Theadora Rama Scales, PA-C  Insulin Glargine Solostar (LANTUS) 100 UNIT/ML Solostar Pen Inject 10 Units into the skin daily at 6 (six) AM. 12/11/21 06/12/22  Theadora Rama Scales, PA-C  Insulin Syringe-Needle U-100 25G X 5/8" 1 ML MISC With use of lantus administration 03/18/19   Georgian Co M, PA-C  Lancets (ACCU-CHEK SAFE-T PRO) lancets Use as instructed 04/18/22   Rising, Lurena Joiner, PA-C  lidocaine (LMX) 4 % cream Apply 1 application topically 3 (three) times daily as needed. Patient not taking: Reported on 06/12/2022 09/17/19   Michela Pitcher A, PA-C  liraglutide (VICTOZA) 18 MG/3ML SOPN Inject 1.8 mg into the skin daily. Patient taking differently: Inject 1.2 mg into the skin daily. 04/12/22 06/12/22  Lurline Idol, FNP  losartan (COZAAR) 50 MG tablet Take 50 mg by mouth daily.    [provider]  mirtazapine (REMERON) 15 MG tablet Take 1 tablet (15 mg total) by mouth at bedtime. 12/11/21 06/09/22  Theadora Rama Scales, PA-C  Misc. Devices  MISC Needs tubing/setup for home nebulizer. Diagnosis- asthma. 10/05/21   Carlisle Beers, FNP  montelukast (SINGULAIR) 10 MG tablet Take 1 tablet (10 mg total) by mouth at bedtime. 12/11/21 06/12/22  Theadora Rama Scales, PA-C  omeprazole (PRILOSEC) 40 MG capsule Take 1 capsule (40 mg total) by mouth daily. Patient taking differently: Take 40 mg by mouth in the morning and at bedtime. 12/11/21 06/12/22  Theadora Rama Scales, PA-C  Oxycodone HCl 10 MG TABS Take 10 mg by mouth in the morning, at noon, in the evening, and at bedtime.    [provider]  PROAIR HFA 108 [phone] Base) MCG/ACT inhaler Inhale 2 puffs into the lungs every 4 (four) hours as needed for wheezing or shortness of breath. 12/11/21 12/11/22  Theadora Rama Scales, PA-C  propranolol (INDERAL) 20 MG tablet Take 20 mg by mouth daily.    [provider]  Respiratory Therapy Supplies (NEBULIZER) DEVI Nebulizer machine Dispense #1 Diagnosis: COPD 09/07/18   Mardella Layman, MD  Sodium Sulfate-Mag Sulfate-KCl (SUTAB) [phone] MG TABS Take 24 Tablets Per Prep Sheet 08/30/20     Spacer/Aero-Holding Chambers (AEROCHAMBER PLUS) inhaler Use as instructed 01/25/21   Domenick Gong, MD  Vitamin D, Ergocalciferol, (DRISDOL) 1.25 MG (50000 UNIT) CAPS capsule Take 1 (one) Capsule by mouth weekly 10/02/20     ADVAIR DISKUS 100-50 MCG/DOSE AEPB INHALE 1 PUFF INTO THE LUNGS DAILY. 10/26/19 03/16/20  Fulp, Cammie, MD  divalproex (DEPAKOTE ER) 500 MG 24 hr tablet Take 2 tablets (1,000 mg total) by mouth 2 (two) times daily. Patient not taking: Reported on 09/17/2019 03/22/19 03/16/20  Pricilla Loveless, MD  levETIRAcetam (KEPPRA) 500 MG tablet Take 1 tablet (500 mg total) by mouth 2 (two) times daily for 14 days. After 2 weeks increase to 2 tablets twice per day by mouth. Patient not taking: Reported on 09/17/2019 03/22/19 03/16/20  Pricilla Loveless, MD  loratadine (CLARITIN) 10 MG tablet Take 1 tablet (10 mg total) by mouth daily. 04/20/18  03/16/20  Sabas Sous, MD  phenytoin (DILANTIN) 100 MG ER capsule Take 2 capsules (200 mg total) by mouth 3 (three) times daily. Patient not taking: Reported on 09/17/2019 03/22/19 03/16/20  Pricilla Loveless, MD  prochlorperazine (COMPAZINE) 5 MG tablet TAKE 1 TABLET BY MOUTH EVERY 6 HOURS AS NEEDED FOR NAUSEA/VOMITING. 09/24/19 03/16/20  Fulp, Cammie, MD      Allergies    Bee venom, Doxycycline hyclate, Ibuprofen, Zofran [ondansetron], Penicillins, Clindamycin/lincomycin, Coconut flavor, Flagyl [metronidazole], Flavoring agent, Other, Proanthocyanidin, Aspirin, Egg-derived products, and Latex    Review of Systems   Review of Systems  Constitutional:  Negative for chills and fever.  Gastrointestinal:  Positive for abdominal pain, nausea and vomiting. Negative for blood in stool, constipation and diarrhea.    Physical Exam Updated Vital Signs BP (!) 150/90   Pulse 88   Temp 98.2 F (36.8 C) (Oral)   Resp 18   Ht 5\' 4"  (1.626 m)   Wt 102.1 kg   LMP 08/23/2022   SpO2 100%   BMI 38.64 kg/m  Physical Exam Vitals and nursing note reviewed.  Constitutional:      General: She is not in acute distress.    Appearance: Normal appearance. She is not ill-appearing or diaphoretic.  Cardiovascular:     Rate and Rhythm: Normal rate and regular rhythm.  Pulmonary:     Effort: Pulmonary effort is normal.  Abdominal:     General: Abdomen is flat. Bowel sounds are normal.     Palpations: Abdomen is soft.     Tenderness: There is abdominal tenderness in the right lower quadrant, periumbilical area and left lower quadrant.     Hernia: A hernia is present. Hernia is present in the ventral area (supraumbilical ventral hernia).  Neurological:     Mental Status: She is alert. Mental status is at baseline.  Psychiatric:        Mood and Affect: Mood normal.        Behavior: Behavior normal.     ED Results / Procedures / Treatments   Labs (all labs ordered are listed, but only abnormal results are  displayed) Labs Reviewed  COMPREHENSIVE METABOLIC PANEL - Abnormal; Notable for the following components:      Result Value   CO2 21 (*)    AST 14 (*)  All other components within normal limits  LIPASE, BLOOD  CBC WITH DIFFERENTIAL/PLATELET  I-STAT BETA HCG BLOOD, ED ([facility], [facility], [facility] ONLY)    EKG None  Radiology CT ABDOMEN PELVIS W CONTRAST  Result Date: 08/28/2022 CLINICAL DATA:  Acute abdominal pain. EXAM: CT ABDOMEN AND PELVIS WITH CONTRAST TECHNIQUE: Multidetector CT imaging of the abdomen and pelvis was performed using the standard protocol following bolus administration of intravenous contrast. RADIATION DOSE REDUCTION: This exam was performed according to the departmental dose-optimization program which includes automated exposure control, adjustment of the mA and/or kV according to patient size and/or use of iterative reconstruction technique. CONTRAST:  75mL OMNIPAQUE IOHEXOL 350 MG/ML SOLN COMPARISON:  CT abdomen and pelvis 06/16/2016 FINDINGS: Lower chest: No acute abnormality. Hepatobiliary: No focal liver abnormality is seen. Status post cholecystectomy. No biliary dilatation. Pancreas: Unremarkable. No pancreatic ductal dilatation or surrounding inflammatory changes. Spleen: There is a focal hypodensity in the spleen measuring 2 cm in 41 Hounsfield units. This is not seen on prior noncontrast study. Spleen is normal in size. Adrenals/Urinary Tract: Adrenal glands are unremarkable. Kidneys are normal, without renal calculi, focal lesion, or hydronephrosis. Bladder is unremarkable. Stomach/Bowel: Stomach is within normal limits. Appendix appears normal. No evidence of bowel wall thickening, distention, or inflammatory changes. Vascular/Lymphatic: Aortic atherosclerosis. No enlarged abdominal or pelvic lymph nodes. Reproductive: Enlarged lobulated fibroid uterus again seen with largest fibroid measuring 5.6 cm. Adnexa are within normal limits. Other: Small supraumbilical ventral hernia  containing mesenteric fat and fluid again noted, mildly increased in size. Fluid in the hernia sac has increased hernia sac. There is no ascites. Musculoskeletal: No acute osseous findings. There is some sclerosis of the bilateral sacroiliac joints, unchanged. IMPRESSION: 1. Supraumbilical ventral hernia containing mesenteric fat and fluid has mildly increased in size. Fluid in the hernia sac has also increased. 2. Fibroid uterus. 3. Indeterminate splenic lesion, possibly hemangioma. This can be further evaluated with ultrasound if clinically warranted. Aortic Atherosclerosis (ICD10-I70.0). Electronically Signed   By: Darliss Cheney M.D.   On: 08/28/2022 17:03    Procedures Procedures    Medications Ordered in ED Medications  promethazine (PHENERGAN) 12.5 mg in sodium chloride 0.9 % 50 mL IVPB (has no administration in time range)  morphine (PF) 4 MG/ML injection 4 mg (4 mg Intravenous Given 08/29/22 1043)    ED Course/ Medical Decision Making/ A&P                             Medical Decision Making Amount and/or Complexity of Data Reviewed Labs: ordered.  Risk Prescription drug management.   This patient presents to the ED with chief complaint(s) of periumbilical pain, umbilical hernia with pertinent past medical history of obesity, DM, HIV, tobacco abuse.  The complaint involves an extensive differential diagnosis and also carries with it a high risk of complications and morbidity.    The differential diagnosis includes incarcerated hernia, strangulated hernia   The initial plan is to obtain baseline labs, give IV analgesia  Additional history obtained: Records reviewed  - patient had CT and bloodwork performed yesterday but left prior to being evaluated by provider.  CT shows a ventral hernia that is fat and fluid containing.  Initial Assessment:   Exam significant for exquisite tenderness to palpation of supraumbilical region.  Patient is barely able to tolerate light palpation.   Hernia is palpable, but unable to fully assess due to patient's pain.  Lower abdomen is also tender to  palpation.  No overlying skin changes.  Bowel sounds are normal.    Independent ECG/labs interpretation:  The following labs were independently interpreted:  CBC without leukocytosis or anemia.  Independent visualization and interpretation of imaging: I independently visualized the following imaging with scope of interpretation limited to determining acute life threatening conditions related to emergency care: CT abdomen pelvis, performed yesterday, shows a ventral hernia that contains fat and fluid.  I agree with radiologist interpretation.   Treatment and Reassessment: Patient given IV pain medicine.  Will attempt reduction of suspected incarcerated hernia once pain is improved.    Patient reports pain is mildly improved, but still significant.  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  Attempted to reduce hernia, but was unable to fully reduce.  Able to fully palpate the hernia which is mobile and soft, but is painful to palpation.   Disposition:   Pain medication and anti-emetic sent to patient's pharmacy for symptomatic management.  Patient is agreeable to outpatient follow-up with general surgery.  Patient states she does not wish to receive further medication while in the ED due to needing to pick up children from school.  Patient's laboratory work-up is overall reassuring.  She appears comfortable and is not in distress.    The patient has been appropriately medically screened and/or stabilized in the ED. I have low suspicion for any other emergent medical condition which would require further screening, evaluation or treatment in the ED or require inpatient management. At time of discharge the patient is hemodynamically stable and in no acute distress. I have discussed work-up results and diagnosis with patient and answered all  questions. Patient is agreeable with discharge plan. We discussed strict return precautions for returning to the emergency department and they verbalized understanding.    Social Determinants of Health:   Patient's  tobacco abuse   increases the complexity of managing their presentation         Final Clinical Impression(s) / ED Diagnoses Final diagnoses:  Ventral hernia without obstruction or gangrene    Rx / DC Orders ED Discharge Orders          Ordered    promethazine (PHENERGAN) 12.5 MG tablet  Every 6 hours PRN        08/29/22 1153              Melton Alar R, PA-C 08/29/22 1153    Virgina Norfolk, DO 08/29/22 1411

## 2022-08-29 NOTE — Discharge Instructions (Addendum)
Thank you for allowing me to be a part of your care today.   Your imaging shows that you have a ventral hernia that contains fat and fluid.  We were unable to fully reduce your hernia (push it back in).  I recommend you schedule a follow-up appointment with general surgery as soon as possible.  I provided their information for you.   I have sent an anti-emetic to your pharmacy.  Please take your pain medicine as prescribed by Dr. Noralyn Pick.  I recommend taking 1000 mg of Tylenol for mild to moderate pain.  DO NOT exceed 4000 mg of Tylenol in a 24-hour period from all sources.

## 2022-09-19 ENCOUNTER — Other Ambulatory Visit (HOSPITAL_COMMUNITY): Payer: Self-pay

## 2022-10-22 ENCOUNTER — Other Ambulatory Visit: Payer: Self-pay

## 2022-10-22 ENCOUNTER — Encounter: Payer: Self-pay | Admitting: Family

## 2022-10-22 ENCOUNTER — Ambulatory Visit (INDEPENDENT_AMBULATORY_CARE_PROVIDER_SITE_OTHER): Payer: Medicaid Other | Admitting: Family

## 2022-10-22 ENCOUNTER — Other Ambulatory Visit (HOSPITAL_COMMUNITY): Payer: Self-pay

## 2022-10-22 VITALS — BP 141/105 | HR 78 | Temp 98.2°F | Resp 16 | Wt 230.2 lb

## 2022-10-22 DIAGNOSIS — F339 Major depressive disorder, recurrent, unspecified: Secondary | ICD-10-CM | POA: Diagnosis not present

## 2022-10-22 DIAGNOSIS — Z9189 Other specified personal risk factors, not elsewhere classified: Secondary | ICD-10-CM

## 2022-10-22 DIAGNOSIS — B20 Human immunodeficiency virus [HIV] disease: Secondary | ICD-10-CM | POA: Diagnosis not present

## 2022-10-22 DIAGNOSIS — Z0000 Encounter for general adult medical examination without abnormal findings: Secondary | ICD-10-CM

## 2022-10-22 MED ORDER — BIKTARVY 50-200-25 MG PO TABS
1.0000 | ORAL_TABLET | Freq: Every day | ORAL | 5 refills | Status: DC
Start: 2022-10-22 — End: 2023-07-01
  Filled 2022-10-22 – 2023-01-30 (×2): qty 30, 30d supply, fill #0
  Filled 2023-02-26: qty 30, 30d supply, fill #1
  Filled 2023-03-24: qty 30, 30d supply, fill #2
  Filled 2023-04-28: qty 30, 30d supply, fill #3

## 2022-10-22 NOTE — Patient Instructions (Addendum)
Nice to see you.  We will check your lab work today.  Continue to take your medication daily as prescribed.  Refills have been sent to the pharmacy.  Plan for follow up in 1 months or sooner if needed with lab work on the same day.  Have a great day and stay safe!  [facility] (24/7) Phone: [phone] Address: [address] [place], [place] [address]  [facility] [phone] Crisis Line [phone]
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
## 2022-10-22 NOTE — Assessment & Plan Note (Signed)
Discussed importance of safe sexual practice and condom use. Condoms and STD testing offered.  Pap smear to be scheduled with Rexene Alberts, NP or GYN.  Mammogram ordered Reviewed vaccinations and up to date.

## 2022-10-22 NOTE — Assessment & Plan Note (Signed)
Exacerbation of depression and not currently on medications. No suicidal ideations or signs of psychosis. She will need to follow up with Psychiatry given the medications that work best being lithium and adderall. Have provided mental health resources in AVS and advised to seek emergency care if thoughts of suicide develop.

## 2022-10-22 NOTE — Assessment & Plan Note (Signed)
Lori Ford likely has poorly controlled virus after being off medication for the past 2 months. Fortunately no signs/symptoms of opportunistic infection or progressive HIV disease. Discussed importance of letting us know if help is needed so we can connect her to resources. Reviewed previous lab work and discussed plan of care. Restart Biktarvy. Check lab work. Plan for follow up in 1 month or sooner if needed with lab work on the same day.

## 2022-10-23 LAB — T-HELPER CELL (CD4) - (RCID CLINIC ONLY)
CD4 % Helper T Cell: 18 % — ABNORMAL LOW (ref 33–65)
CD4 T Cell Abs: 585 /uL (ref 400–1790)

## 2022-10-31 LAB — HIV RNA, RTPCR W/R GT (RTI, PI,INT)
HIV 1 RNA Quant: 96700 copies/mL — ABNORMAL HIGH
HIV-1 RNA Quant, Log: 4.99 Log copies/mL — ABNORMAL HIGH

## 2022-10-31 LAB — COMPLETE METABOLIC PANEL WITH GFR
AG Ratio: 1.3 (calc) (ref 1.0–2.5)
ALT: 11 U/L (ref 6–29)
AST: 16 U/L (ref 10–30)
Albumin: 4.3 g/dL (ref 3.6–5.1)
Alkaline phosphatase (APISO): 67 U/L (ref 31–125)
BUN: 10 mg/dL (ref 7–25)
CO2: 25 mmol/L (ref 20–32)
Calcium: 9.2 mg/dL (ref 8.6–10.2)
Chloride: 105 mmol/L (ref 98–110)
Creat: 0.86 mg/dL (ref 0.50–0.99)
Globulin: 3.2 g/dL (calc) (ref 1.9–3.7)
Glucose, Bld: 96 mg/dL (ref 65–99)
Potassium: 4.1 mmol/L (ref 3.5–5.3)
Sodium: 137 mmol/L (ref 135–146)
Total Bilirubin: 0.4 mg/dL (ref 0.2–1.2)
Total Protein: 7.5 g/dL (ref 6.1–8.1)
eGFR: 85 mL/min/{1.73_m2} (ref >=60)

## 2022-10-31 LAB — HIV-1 INTEGRASE GENOTYPE

## 2022-10-31 LAB — HIV-1 GENOTYPE: HIV-1 Genotype: DETECTED — AB

## 2022-11-08 ENCOUNTER — Other Ambulatory Visit (HOSPITAL_COMMUNITY)
Admission: RE | Admit: 2022-11-08 | Discharge: 2022-11-08 | Disposition: A | Discharge status: Home / Self Care | Payer: Medicaid Other | Source: Ambulatory Visit | Attending: Infectious Diseases | Admitting: Infectious Diseases

## 2022-11-08 ENCOUNTER — Other Ambulatory Visit: Payer: Self-pay

## 2022-11-08 ENCOUNTER — Encounter: Payer: Self-pay | Admitting: Infectious Diseases

## 2022-11-08 ENCOUNTER — Ambulatory Visit (INDEPENDENT_AMBULATORY_CARE_PROVIDER_SITE_OTHER): Payer: Medicaid Other | Admitting: Infectious Diseases

## 2022-11-08 VITALS — BP 149/66 | HR 67 | Temp 98.3°F | Ht 64.0 in | Wt 230.0 lb

## 2022-11-08 DIAGNOSIS — Z124 Encounter for screening for malignant neoplasm of cervix: Secondary | ICD-10-CM | POA: Insufficient documentation

## 2022-11-08 DIAGNOSIS — Z113 Encounter for screening for infections with a predominantly sexual mode of transmission: Secondary | ICD-10-CM

## 2022-11-08 DIAGNOSIS — N921 Excessive and frequent menstruation with irregular cycle: Secondary | ICD-10-CM

## 2022-11-08 DIAGNOSIS — B20 Human immunodeficiency virus [HIV] disease: Secondary | ICD-10-CM | POA: Diagnosis not present

## 2022-11-08 NOTE — Progress Notes (Signed)
Subjective :    Lori Ford is a 47 y.o. female here for preventative cervical cancer screening.   Review of Systems: Current GYN complaints or concerns: periods 2x a month now for many months.  Very painful. She gets dizzy with the bleeding and pain frequently. She has never had any abnormal pap smears in her past.  She would like to see GYN for this problem.   Patient denies any abdominal/pelvic pain, problems with bowel movements, urination, vaginal discharge or intercourse.   Past Medical History:  Diagnosis Date   Allergic rhinitis    Anemia    Anxiety    Arthritis    Asthma    as child   Brain tumor (benign) (HCC)    Chronic back pain    Cigarette nicotine dependence with withdrawal    COPD (chronic obstructive pulmonary disease) (HCC)    Depression    Diabetes mellitus without complication (HCC)    DVT (deep venous thrombosis) (HCC)    Endometriosis    Fibromyalgia    Gallstones    s/p cholecystectomy   GERD (gastroesophageal reflux disease)    Heart murmur    no issues   HIV (human immunodeficiency virus infection) (HCC)    HTN (hypertension)    Meningioma (HCC)    Migraine    Pneumonia    Sciatic pain    Seizure disorder, grand mal (HCC)    dx 2005   Seizures (HCC)    due to head trauma as adult   Sleep apnea    wears cpap   Stroke (HCC)    2014 no residual    Gynecologic History: [id number]  No LMP recorded. Contraception: post menopausal status Last Pap: 03/2019. Results were: normal Last Mammogram: 10/22/2022 (order entered) planning for Dec 2024. Results were: normal    Objective :   Physical Exam - chaperone present  Constitutional: Well developed, well nourished, no acute distress. She is alert and oriented x3.  Pelvic: External genitalia is normal in appearance. The vagina is normal in appearance. The cervix is bulbous and easily visualized. No CMT, normal expected cervical mucus present. Bimanual exam reveals uterus that is felt to  be normal size, shape, and contour. No adnexal masses or tenderness noted. Breasts: symmetrical in contour, shape and texture. No palpable masses/nodules. No nipple discharge.  Psych: She has a normal mood and affect.      Assessment & Plan:    Patient Active Problem List   Diagnosis Date Noted   Screening for cervical cancer 11/08/2022   Smoker 06/30/2022   Housing instability 02/14/2022   Therapeutic drug monitoring 09/27/2021   Other spondylosis with radiculopathy, cervical region 07/11/2021   Diabetes mellitus type 2 in obese 09/08/2019   Uterine fibroid 03/24/2019   Menorrhagia with irregular cycle 03/24/2019   Healthcare maintenance 04/17/2018   Presence of IVC filter 10/29/2015   Chest pain 12/10/2014   Chest wall pain 12/10/2014   Left leg pain 12/10/2014   Left knee pain 12/10/2014   Nausea vomiting and diarrhea 12/10/2014   Seizures (HCC) 12/10/2014   Seizure (HCC) 12/10/2014   Hyperkalemia 12/10/2014   HIV disease (HCC) 05/11/2014   Major depression, recurrent, chronic (HCC) 05/11/2014   Morbid obesity (HCC) 08/10/2013   DVT, popliteal, acute, left 10/06/2012   5mm Pulmonary nodule, right upper lobe.  repeat CT chest May 2015. 10/06/2012   Asthma 10/06/2012   Hypertension 10/06/2012   GERD (gastroesophageal reflux disease) 10/06/2012   Cigarette nicotine dependence  with withdrawal 10/06/2012   Seizure disorder, grand mal (HCC) 09/25/2012    Problem List Items Addressed This Visit       Unprioritized   Screening for cervical cancer   Relevant Orders   Cervicovaginal ancillary only( [place])   Menorrhagia with irregular cycle    Referral to Center for [facility] placed to discuss further management for bleeding likely related to uterine fibroids.  I recommended she start an iron supplement (6 hours separated from [place]) to keep on top of iron stores with frequent blood loss described.       Relevant Orders   Ambulatory referral to  Obstetrics / Gynecology   HIV disease [facility]) - Primary   Relevant Orders   HIV 1 RNA quant-no reflex-bld   Other Visit Diagnoses     Routine screening for STI (sexually transmitted infection)       Relevant Orders   Cytology - PAP( [place])         Rexene Alberts, MSN, NP-C [facility] Office: [phone] Pager: [phone]  11/08/22 9:51 AM

## 2022-11-08 NOTE — Patient Instructions (Addendum)
Nice to meet you - will let you know what your lab results show when they come back.   Stop by the lab before you leave - Tammy Sours wanted you to recheck your viral load to make sure it was coming back down.   I will put a referral in for [facility] for you - call them for an appointment to see if they can help with your frequent periods   [facility] for [facility] at [facility] [address] [place],  [place]  [address]  Main: [phone]  I would recommend to start an over the counter iron supplement with you bleeding so much - would take this around dinner so it does not block the absorption of your Biktarvy.    Please come back to see Tammy Sours in 3 months.

## 2022-11-08 NOTE — Assessment & Plan Note (Addendum)
Referral to Center for [facility] placed to discuss further management for bleeding likely related to uterine fibroids.  I recommended she start an iron supplement (6 hours separated from [place]) to keep on top of iron stores with frequent blood loss described.

## 2022-11-11 ENCOUNTER — Other Ambulatory Visit: Payer: Self-pay

## 2022-11-13 ENCOUNTER — Other Ambulatory Visit: Payer: Self-pay

## 2022-11-15 ENCOUNTER — Other Ambulatory Visit: Payer: Self-pay

## 2022-12-02 ENCOUNTER — Ambulatory Visit (HOSPITAL_COMMUNITY): Payer: Medicaid Other | Admitting: Clinical

## 2022-12-10 ENCOUNTER — Telehealth: Payer: Self-pay

## 2022-12-10 ENCOUNTER — Other Ambulatory Visit (HOSPITAL_COMMUNITY): Payer: Self-pay

## 2022-12-10 NOTE — Telephone Encounter (Signed)
[name]  Patient's came to [facility] and picked up a 1 month supply of Biktarvy on 12/10/22.  Clearance Coots , CPhT Specialty Pharmacy Patient [facility] Phone: [phone] Fax:  [phone]

## 2022-12-17 ENCOUNTER — Other Ambulatory Visit: Payer: Self-pay

## 2022-12-20 ENCOUNTER — Ambulatory Visit (HOSPITAL_COMMUNITY)
Admission: EM | Admit: 2022-12-20 | Discharge: 2022-12-20 | Disposition: A | Discharge status: Home / Self Care | Payer: Medicaid Other

## 2022-12-20 ENCOUNTER — Encounter (HOSPITAL_COMMUNITY): Payer: Self-pay | Admitting: *Deleted

## 2022-12-20 DIAGNOSIS — J069 Acute upper respiratory infection, unspecified: Secondary | ICD-10-CM

## 2022-12-20 MED ORDER — PROMETHAZINE-DM 6.25-15 MG/5ML PO SYRP: 5.0000 mL | ORAL_SOLUTION | Freq: Four times a day (QID) | ORAL | 0 refills | Status: DC | PRN
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
## 2022-12-20 NOTE — ED Triage Notes (Signed)
Pt states she needs a refill of promethazine DM. She states her primary care used to refill it when she needed it but he has retired and she has to wait until next month to get a new PCP.

## 2022-12-20 NOTE — Discharge Instructions (Signed)
Take cough syrup as needed Recommend Mucinex and Flonase Drink plenty of fluids, rest

## 2023-01-09 ENCOUNTER — Ambulatory Visit (INDEPENDENT_AMBULATORY_CARE_PROVIDER_SITE_OTHER): Payer: Medicaid Other

## 2023-01-09 ENCOUNTER — Encounter (HOSPITAL_COMMUNITY): Payer: Self-pay

## 2023-01-09 ENCOUNTER — Ambulatory Visit (HOSPITAL_COMMUNITY)
Admission: RE | Admit: 2023-01-09 | Discharge: 2023-01-09 | Disposition: A | Discharge status: Home / Self Care | Payer: Medicaid Other | Source: Ambulatory Visit | Attending: Family Medicine | Admitting: Family Medicine

## 2023-01-09 VITALS — BP 125/85 | HR 74 | Temp 98.5°F | Resp 16

## 2023-01-09 DIAGNOSIS — M25531 Pain in right wrist: Secondary | ICD-10-CM

## 2023-01-09 DIAGNOSIS — G5601 Carpal tunnel syndrome, right upper limb: Secondary | ICD-10-CM

## 2023-01-09 NOTE — Discharge Instructions (Signed)
You were seen today for wrist pain, likely carpal tunnel.  I have given you a wrist splint to use while at work and sleeping.  You may ice the wrist, and take motrin/ibuprofen for pain.  If your pain continues you may need to see a specialist for further care.

## 2023-01-09 NOTE — ED Triage Notes (Signed)
Patient c/o right wrist pain x 1 1/2 weeks ago. Patient reports a history of carpal tunnel when she ws 47 years old.  Patient reports that she goes to pain management.
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
## 2023-01-30 ENCOUNTER — Other Ambulatory Visit (HOSPITAL_COMMUNITY): Payer: Self-pay | Admitting: Pharmacy Technician

## 2023-01-30 ENCOUNTER — Other Ambulatory Visit (HOSPITAL_COMMUNITY): Payer: Self-pay

## 2023-01-30 NOTE — Progress Notes (Signed)
Specialty Pharmacy Refill Coordination Note  Lori Ford is a 47 y.o. female contacted today regarding refills of specialty medication(s) Bictegravir-Emtricitab-Tenofov   Patient requested Courier to Provider Office   Delivery date: 02/04/23   Verified address: [facility] [address]   Medication will be filled on 02/03/23.

## 2023-02-04 ENCOUNTER — Telehealth: Payer: Self-pay

## 2023-02-04 ENCOUNTER — Ambulatory Visit (HOSPITAL_COMMUNITY)
Admission: EM | Admit: 2023-02-04 | Discharge: 2023-02-04 | Disposition: A | Discharge status: Home / Self Care | Payer: Medicaid Other | Attending: Family Medicine | Admitting: Family Medicine

## 2023-02-04 DIAGNOSIS — R052 Subacute cough: Secondary | ICD-10-CM

## 2023-02-04 DIAGNOSIS — J309 Allergic rhinitis, unspecified: Secondary | ICD-10-CM | POA: Diagnosis not present

## 2023-02-04 DIAGNOSIS — Z760 Encounter for issue of repeat prescription: Secondary | ICD-10-CM | POA: Diagnosis not present

## 2023-02-04 MED ORDER — PROMETHAZINE-DM 6.25-15 MG/5ML PO SYRP: 5.0000 mL | ORAL_SOLUTION | Freq: Four times a day (QID) | ORAL | 0 refills | Status: DC | PRN
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
## 2023-02-04 NOTE — Discharge Instructions (Signed)
You were seen today for cough/allergies and a refill of your medication.  This was refilled today per usual.  Please return if you have worsening symptoms despite medication, and follow up with your primary care provider as already scheduled.

## 2023-02-04 NOTE — ED Triage Notes (Signed)
Pt needing  Promethazine DM refilled until can get into her doctor in 2 weeks. Allergies have flared up.

## 2023-02-04 NOTE — Telephone Encounter (Signed)
[name]  Patient's medications (BIKTARVY) have been couriered to [facility] from [facility] and will be picked up 02/03/22.  Kae Heller, CPhT Specialty Pharmacy Patient [facility] Phone: [phone] Fax:  [phone]

## 2023-02-10 ENCOUNTER — Ambulatory Visit: Payer: Medicaid Other | Admitting: Family

## 2023-02-17 ENCOUNTER — Other Ambulatory Visit (HOSPITAL_COMMUNITY): Payer: Self-pay

## 2023-02-17 ENCOUNTER — Telehealth: Payer: Self-pay

## 2023-02-17 NOTE — Telephone Encounter (Signed)
Medication was picked up at [facility] on  02/17/23 9:40AM

## 2023-02-23 ENCOUNTER — Ambulatory Visit (HOSPITAL_COMMUNITY)
Admission: EM | Admit: 2023-02-23 | Discharge: 2023-02-23 | Disposition: A | Discharge status: Home / Self Care | Payer: Medicaid Other

## 2023-02-23 ENCOUNTER — Other Ambulatory Visit: Payer: Self-pay

## 2023-02-23 ENCOUNTER — Encounter (HOSPITAL_COMMUNITY): Payer: Self-pay | Admitting: *Deleted

## 2023-02-23 DIAGNOSIS — R052 Subacute cough: Secondary | ICD-10-CM

## 2023-02-23 DIAGNOSIS — Z760 Encounter for issue of repeat prescription: Secondary | ICD-10-CM | POA: Diagnosis not present

## 2023-02-23 DIAGNOSIS — J069 Acute upper respiratory infection, unspecified: Secondary | ICD-10-CM

## 2023-02-23 MED ORDER — PROMETHAZINE-DM 6.25-15 MG/5ML PO SYRP: 5.0000 mL | ORAL_SOLUTION | Freq: Four times a day (QID) | ORAL | 0 refills | Status: DC | PRN

## 2023-02-23 MED ORDER — ACCU-CHEK AVIVA PLUS VI STRP: 1.0000 | ORAL_STRIP | Freq: Four times a day (QID) | 1 refills | Status: AC

## 2023-02-23 MED ORDER — METFORMIN HCL 500 MG PO TABS: 500.0000 mg | ORAL_TABLET | Freq: Two times a day (BID) | ORAL | 1 refills | Status: DC

## 2023-02-23 NOTE — ED Triage Notes (Signed)
C/O needing refill for metformin, test strips, and would like Rx refill for promethazine DM that she received 02/04/23. States since that visit, she has continued with cough, significant rhinorrhea, and watery eyes.  States has appt with PCP next month.

## 2023-02-23 NOTE — Discharge Instructions (Addendum)
Allergic rhinitis with subacute remaining cough. Will continue Promethazine-DM for cough and can use OTC mucinex for congestion. Continue allegra and flonase.   Refills sent in for metformin and test strips. Keep follow up with PCP as schedule.   Return to urgent care or PCP if symptoms worsen or fail to resolve.
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
## 2023-02-26 ENCOUNTER — Other Ambulatory Visit: Payer: Self-pay

## 2023-02-26 NOTE — Progress Notes (Signed)
Specialty Pharmacy Refill Coordination Note  Lori Ford is a 47 y.o. female contacted today regarding refills of specialty medication(s) Bictegravir-Emtricitab-Tenofov   Patient requested Courier to Provider Office   Delivery date: 03/03/23   Verified address: [facility] [address] [place] [address]   Medication will be filled on 02/28/23.

## 2023-02-28 ENCOUNTER — Other Ambulatory Visit: Payer: Self-pay

## 2023-03-02 ENCOUNTER — Other Ambulatory Visit: Payer: Self-pay

## 2023-03-02 ENCOUNTER — Ambulatory Visit (INDEPENDENT_AMBULATORY_CARE_PROVIDER_SITE_OTHER): Payer: Medicaid Other

## 2023-03-02 ENCOUNTER — Encounter (HOSPITAL_COMMUNITY): Payer: Self-pay | Admitting: Emergency Medicine

## 2023-03-02 ENCOUNTER — Ambulatory Visit (HOSPITAL_COMMUNITY)
Admission: EM | Admit: 2023-03-02 | Discharge: 2023-03-02 | Disposition: A | Discharge status: Home / Self Care | Payer: Medicaid Other | Attending: Physician Assistant | Admitting: Physician Assistant

## 2023-03-02 DIAGNOSIS — J441 Chronic obstructive pulmonary disease with (acute) exacerbation: Secondary | ICD-10-CM

## 2023-03-02 LAB — POCT FASTING CBG KUC MANUAL ENTRY: POCT Glucose (KUC): 117 mg/dL — AB (ref 70–99)

## 2023-03-02 MED ORDER — AZITHROMYCIN 250 MG PO TABS: 250.0000 mg | ORAL_TABLET | Freq: Every day | ORAL | 0 refills | Status: DC

## 2023-03-02 MED ORDER — FLUTICASONE PROPIONATE HFA 220 MCG/ACT IN AERO: 1.0000 | INHALATION_SPRAY | Freq: Two times a day (BID) | RESPIRATORY_TRACT | 1 refills | Status: AC

## 2023-03-02 MED ORDER — PROMETHAZINE-DM 6.25-15 MG/5ML PO SYRP: 5.0000 mL | ORAL_SOLUTION | Freq: Four times a day (QID) | ORAL | 0 refills | Status: DC | PRN

## 2023-03-02 MED ORDER — PREDNISONE 10 MG PO TABS: 30.0000 mg | ORAL_TABLET | Freq: Every day | ORAL | 0 refills | Status: AC

## 2023-03-02 NOTE — Discharge Instructions (Signed)
Your x-ray did not show any evidence of pneumonia.  I am concerned that you have an asthma/COPD exacerbation.  Continue your inhalers as previously prescribed.  Start azithromycin.  Take prednisone 30 mg for 4 days.  This will raise your blood sugar so try to avoid carbohydrates and drink plenty of fluid.  If your blood sugars are very elevated please return for reevaluation.  Use Promethazine DM for cough.  This will make you sleepy so do not drive drink alcohol while taking it.  If anything worsens and you have worsening cough, shortness of breath, weakness, nausea, vomiting, chest pain you need to be seen immediately.

## 2023-03-02 NOTE — ED Triage Notes (Signed)
Requesting refill of flovent inhaler and promethazine dm
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
## 2023-03-02 NOTE — ED Triage Notes (Signed)
Seen 02/23/2023.  Patient was told to return is situation not cleared with medicines prescribed at the previous visit. Patient has an appt that is in December 2024.  Patient thinks sinus issues is worse, congestion in head, reports yellow phlegm

## 2023-03-02 NOTE — ED Notes (Signed)
Instructed to put on a gown 

## 2023-03-03 ENCOUNTER — Telehealth: Payer: Self-pay

## 2023-03-03 NOTE — Telephone Encounter (Signed)
[name]  Patient's medications BIKTARVY have been couriered to [facility] from [facility] and will be picked up at [facility].  Clearance Coots, CPhT Specialty Pharmacy Patient [facility] Phone: [phone] Fax:  [phone]

## 2023-03-11 ENCOUNTER — Other Ambulatory Visit: Payer: Self-pay

## 2023-03-11 ENCOUNTER — Ambulatory Visit (INDEPENDENT_AMBULATORY_CARE_PROVIDER_SITE_OTHER): Payer: Medicaid Other

## 2023-03-11 ENCOUNTER — Encounter (HOSPITAL_COMMUNITY): Payer: Self-pay | Admitting: Emergency Medicine

## 2023-03-11 ENCOUNTER — Ambulatory Visit (HOSPITAL_COMMUNITY)
Admission: EM | Admit: 2023-03-11 | Discharge: 2023-03-11 | Disposition: A | Discharge status: Home / Self Care | Payer: Medicaid Other | Attending: Emergency Medicine | Admitting: Emergency Medicine

## 2023-03-11 DIAGNOSIS — S8991XA Unspecified injury of right lower leg, initial encounter: Secondary | ICD-10-CM | POA: Diagnosis not present

## 2023-03-11 DIAGNOSIS — M25561 Pain in right knee: Secondary | ICD-10-CM | POA: Diagnosis not present

## 2023-03-11 MED ORDER — KETOROLAC TROMETHAMINE 30 MG/ML IJ SOLN
INTRAMUSCULAR | Status: AC
  Filled 2023-03-11: qty 1

## 2023-03-11 MED ORDER — KETOROLAC TROMETHAMINE 30 MG/ML IJ SOLN
30.0000 mg | Freq: Once | INTRAMUSCULAR | Status: AC
  Administered 2023-03-11: 30 mg via INTRAMUSCULAR

## 2023-03-11 NOTE — Discharge Instructions (Signed)
The Toradol injection given today should start to work in about 30 minutes. Please do not use any NSAIDs (ibuprofen/Advil, naproxen/Aleve, etc) for the next 12 hours. You can safely use tylenol.   Please contact your orthopedic specialist for follow up!
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
## 2023-03-21 ENCOUNTER — Ambulatory Visit (HOSPITAL_COMMUNITY): Payer: Self-pay

## 2023-03-22 ENCOUNTER — Ambulatory Visit (HOSPITAL_COMMUNITY)
Admission: EM | Admit: 2023-03-22 | Discharge: 2023-03-22 | Disposition: A | Discharge status: Home / Self Care | Payer: MEDICAID | Attending: Internal Medicine | Admitting: Internal Medicine

## 2023-03-22 ENCOUNTER — Encounter (HOSPITAL_COMMUNITY): Payer: Self-pay | Admitting: Emergency Medicine

## 2023-03-22 ENCOUNTER — Ambulatory Visit (HOSPITAL_COMMUNITY): Payer: Self-pay

## 2023-03-22 DIAGNOSIS — Z760 Encounter for issue of repeat prescription: Secondary | ICD-10-CM | POA: Diagnosis not present

## 2023-03-22 DIAGNOSIS — L089 Local infection of the skin and subcutaneous tissue, unspecified: Secondary | ICD-10-CM

## 2023-03-22 DIAGNOSIS — L720 Epidermal cyst: Secondary | ICD-10-CM | POA: Diagnosis not present

## 2023-03-22 DIAGNOSIS — N907 Vulvar cyst: Secondary | ICD-10-CM

## 2023-03-22 MED ORDER — MUPIROCIN 2 % EX OINT: 1.0000 | TOPICAL_OINTMENT | Freq: Four times a day (QID) | CUTANEOUS | 1 refills | Status: AC

## 2023-03-22 MED ORDER — PROMETHAZINE-DM 6.25-15 MG/5ML PO SYRP: 5.0000 mL | ORAL_SOLUTION | Freq: Four times a day (QID) | ORAL | 0 refills | Status: DC | PRN

## 2023-03-22 MED ORDER — FLUCONAZOLE 150 MG PO TABS: 150.0000 mg | ORAL_TABLET | Freq: Every day | ORAL | 0 refills | Status: DC

## 2023-03-22 MED ORDER — DOXYCYCLINE HYCLATE 100 MG PO CAPS: 100.0000 mg | ORAL_CAPSULE | Freq: Two times a day (BID) | ORAL | 0 refills | Status: AC

## 2023-03-22 MED ORDER — LIDOCAINE-EPINEPHRINE 1 %-1:100000 IJ SOLN
INTRAMUSCULAR | Status: AC
  Filled 2023-03-22: qty 1

## 2023-03-22 NOTE — Discharge Instructions (Addendum)
Incision and drainage of right labia infected cyst. Wound care instructions: Leave packing in place until tomorrow morning if possible (if it comes out tonight start the mupirocin) Remove packing and Clean the area with soap and water. It is fine to remove the packing in the shower if you want Put a small amount of mupirocin on the opening after every trip to the bathroom and at night before bed until healed Doxycycline 100mg  twice daily for 7 days Diflucan 150mg  take 1 tablet after 2 days on antibiotics, then repeat in 3 days Return to urgent care or PCP if symptoms worsen or fail to resolve.

## 2023-03-22 NOTE — ED Triage Notes (Signed)
Pt presents with abscess on groin for 2 days.
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
## 2023-03-24 ENCOUNTER — Other Ambulatory Visit (HOSPITAL_COMMUNITY): Payer: Self-pay | Admitting: Pharmacy Technician

## 2023-03-24 ENCOUNTER — Other Ambulatory Visit (HOSPITAL_COMMUNITY): Payer: Self-pay

## 2023-03-24 NOTE — Progress Notes (Signed)
Specialty Pharmacy Refill Coordination Note  Lori Ford is a 47 y.o. female contacted today regarding refills of specialty medication(s) Bictegravir-Emtricitab-Tenofov   Patient requested Courier to Provider Office   Delivery date: 04/01/23   Verified address: [facility] [address]   Medication will be filled on 03/31/23.

## 2023-03-31 ENCOUNTER — Other Ambulatory Visit (HOSPITAL_COMMUNITY): Payer: Self-pay

## 2023-04-01 ENCOUNTER — Telehealth: Payer: Self-pay

## 2023-04-01 NOTE — Telephone Encounter (Signed)
 [name]  Patient's medications BIKTARVY have been couriered to [facility] from [facility] and will be picked up at [facility].  Kae Heller, CPhT Specialty Pharmacy Patient [facility] Phone: [phone] Fax:  [phone]

## 2023-04-22 ENCOUNTER — Other Ambulatory Visit: Payer: Self-pay

## 2023-04-25 ENCOUNTER — Other Ambulatory Visit: Payer: Self-pay

## 2023-04-28 ENCOUNTER — Other Ambulatory Visit: Payer: Self-pay

## 2023-04-28 NOTE — Progress Notes (Signed)
 Specialty Pharmacy Refill Coordination Note  Lori Ford is a 48 y.o. female contacted today regarding refills of specialty medication(s) Bictegravir-Emtricitab-Tenofov (Biktarvy )   Patient requested Courier to Provider Office   Delivery date: 05/05/23   Verified address: [facility] [address] [place] [address]   Medication will be filled on 05/02/23.

## 2023-05-02 ENCOUNTER — Other Ambulatory Visit (HOSPITAL_COMMUNITY): Payer: Self-pay

## 2023-05-02 ENCOUNTER — Other Ambulatory Visit: Payer: Self-pay

## 2023-05-05 ENCOUNTER — Telehealth: Payer: Self-pay

## 2023-05-05 NOTE — Telephone Encounter (Signed)
 [name]  Patient's medications BIKTARVY have been couriered to [facility] from [facility] and will be picked up at [facility].  Kae Heller, CPhT Specialty Pharmacy Patient [facility] Phone: [phone] Fax:  [phone]

## 2023-05-22 ENCOUNTER — Other Ambulatory Visit: Payer: Self-pay

## 2023-05-25 ENCOUNTER — Ambulatory Visit (INDEPENDENT_AMBULATORY_CARE_PROVIDER_SITE_OTHER): Payer: MEDICAID

## 2023-05-25 ENCOUNTER — Ambulatory Visit (HOSPITAL_COMMUNITY)
Admission: EM | Admit: 2023-05-25 | Discharge: 2023-05-25 | Disposition: A | Discharge status: Home / Self Care | Payer: MEDICAID

## 2023-05-25 ENCOUNTER — Encounter (HOSPITAL_COMMUNITY): Payer: Self-pay | Admitting: Emergency Medicine

## 2023-05-25 DIAGNOSIS — M25511 Pain in right shoulder: Secondary | ICD-10-CM

## 2023-05-25 DIAGNOSIS — S060X0A Concussion without loss of consciousness, initial encounter: Secondary | ICD-10-CM | POA: Diagnosis not present

## 2023-05-25 MED ORDER — CYCLOBENZAPRINE HCL 5 MG PO TABS: 5.0000 mg | ORAL_TABLET | Freq: Three times a day (TID) | ORAL | 0 refills | Status: AC | PRN

## 2023-05-25 NOTE — Discharge Instructions (Addendum)
 Right shoulder pain: X-ray done today and final results are still pending but on brief evaluation there does not appear to be a separation or fracture.  If the final radiology review shows anything different we will contact you.  Recommend immobilizing with a sling for 2 to 3 days then slowly increase activity as tolerated.  Use Tylenol  for pain.  Will avoid steroids or ibuprofen given recent head injury. Flexeril  5 mg every 8 hours as needed for muscle spasms.  Use caution as this medication can cause drowsiness.   Mild concussion: Symptoms are most consistent with a mild concussion.  This can cause headache and dizziness.  If you develop any worsening of the symptoms recommend going to the emergency room immediately for further evaluation.  Most concussion symptoms will resolve in 5 to 7 days.  Occasionally some people can have some residual symptoms that can last longer and if this occurs, recommend following up with your primary care physician.

## 2023-05-25 NOTE — ED Triage Notes (Signed)
 Patient states a shelf collapsed at work with about 100 cans of can food fell on her yesterday. She states it hit her head and right side of her arm. She c/o headache and lightlessness.
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
## 2023-05-26 ENCOUNTER — Ambulatory Visit: Payer: MEDICAID

## 2023-06-01 ENCOUNTER — Ambulatory Visit (HOSPITAL_COMMUNITY)
Admission: EM | Admit: 2023-06-01 | Discharge: 2023-06-01 | Disposition: A | Discharge status: Home / Self Care | Payer: MEDICAID

## 2023-06-01 ENCOUNTER — Ambulatory Visit (INDEPENDENT_AMBULATORY_CARE_PROVIDER_SITE_OTHER): Payer: MEDICAID

## 2023-06-01 ENCOUNTER — Encounter (HOSPITAL_COMMUNITY): Payer: Self-pay

## 2023-06-01 DIAGNOSIS — M5412 Radiculopathy, cervical region: Secondary | ICD-10-CM | POA: Diagnosis not present

## 2023-06-01 DIAGNOSIS — M542 Cervicalgia: Secondary | ICD-10-CM

## 2023-06-01 MED ORDER — TRIAMCINOLONE ACETONIDE 40 MG/ML IJ SUSP
40.0000 mg | Freq: Once | INTRAMUSCULAR | Status: AC
  Administered 2023-06-01: 40 mg via INTRAMUSCULAR

## 2023-06-01 MED ORDER — TRIAMCINOLONE ACETONIDE 40 MG/ML IJ SUSP
INTRAMUSCULAR | Status: AC
Start: 2023-06-01 — End: ?
  Filled 2023-06-01: qty 1
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
## 2023-06-01 NOTE — ED Triage Notes (Signed)
Patient here today with c/o neck and right arm pain since last week from cans falling on her at work. Patient was here on 05/24/2022. Patient states that the pain is worsening. Patient states that it hurts to reach around to her back.

## 2023-06-01 NOTE — Discharge Instructions (Signed)
Your images showed degeneration of your cervical spine.  I believe this is what is causing the numbness and tingling down your arm.  We have given you a steroid shot to help with the pain and inflammation.  If your symptoms persist please follow-up with an orthopedic for further evaluation.  Continue the muscle relaxer, wearing your arm in a sling, gentle stretching and heat to the sore muscle areas to help with healing.  Return to clinic for any new or urgent symptoms.

## 2023-06-12 ENCOUNTER — Other Ambulatory Visit: Payer: Self-pay

## 2023-06-14 ENCOUNTER — Ambulatory Visit (HOSPITAL_COMMUNITY): Payer: Self-pay

## 2023-06-30 ENCOUNTER — Ambulatory Visit
Admission: RE | Admit: 2023-06-30 | Discharge: 2023-06-30 | Disposition: A | Discharge status: Home / Self Care | Payer: MEDICAID | Source: Ambulatory Visit | Attending: Family

## 2023-06-30 DIAGNOSIS — Z9189 Other specified personal risk factors, not elsewhere classified: Secondary | ICD-10-CM

## 2023-07-01 ENCOUNTER — Ambulatory Visit: Payer: MEDICAID | Admitting: Licensed Clinical Social Worker

## 2023-07-01 ENCOUNTER — Ambulatory Visit (INDEPENDENT_AMBULATORY_CARE_PROVIDER_SITE_OTHER): Payer: MEDICAID | Admitting: Family

## 2023-07-01 ENCOUNTER — Other Ambulatory Visit (HOSPITAL_COMMUNITY): Payer: Self-pay

## 2023-07-01 ENCOUNTER — Other Ambulatory Visit: Payer: Self-pay

## 2023-07-01 ENCOUNTER — Encounter: Payer: Self-pay | Admitting: Family

## 2023-07-01 VITALS — BP 137/91 | HR 79 | Temp 98.4°F | Ht 64.0 in | Wt 238.0 lb

## 2023-07-01 DIAGNOSIS — B20 Human immunodeficiency virus [HIV] disease: Secondary | ICD-10-CM | POA: Diagnosis not present

## 2023-07-01 DIAGNOSIS — F339 Major depressive disorder, recurrent, unspecified: Secondary | ICD-10-CM

## 2023-07-01 DIAGNOSIS — Z79899 Other long term (current) drug therapy: Secondary | ICD-10-CM

## 2023-07-01 DIAGNOSIS — Z113 Encounter for screening for infections with a predominantly sexual mode of transmission: Secondary | ICD-10-CM

## 2023-07-01 DIAGNOSIS — Z0000 Encounter for general adult medical examination without abnormal findings: Secondary | ICD-10-CM

## 2023-07-01 MED ORDER — BIKTARVY 50-200-25 MG PO TABS
1.0000 | ORAL_TABLET | Freq: Every day | ORAL | 5 refills | Status: AC
  Filled 2023-07-01: qty 30, 30d supply, fill #0

## 2023-07-01 NOTE — Progress Notes (Signed)
   THERAPIST PROGRESS NOTE  Session Time: 30  Participation Level: Active  Behavioral Response: Casual, Alert, Depressed  Type of Therapy:  Therapist met with client to complete intake for counseling  Treatment Goals addressed: Client and therapist introduced themselves. Therapist went over therapy and what that would look like at [facility] and completed intake.  ProgressTowards Goals: Initial  Interventions: Motivational Interviewing  Summary: Lori Ford is a 48 y.o. female who presents with symptoms of depression, grief, and stress.   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 Client reports that one of her siblings passed two months ago and one passed last week. Client reports she is close with her ex husbands family and his uncle passed last night. Client reports she have 5 children all of which are adults except for the youngest who is 46 years old. Client reports she is currently taking care of her 46 year old and two of her children's 5 kids at her apartment. Client reports lack of support. Client is currently on disability and is epileptic.   Plan: Individual therapy with therapist at [facility].  Diagnosis: Major Depressive Disorder  Collaboration of Care: [facility] and [facility] Provider  Patient was advised Release of Information must be obtained prior to any record release in order to collaborate their care with an outside provider. Patient/Guardian was advised if they have not already done so to contact the registration department to sign all necessary forms in order for [place] to release information regarding their care.   Consent: Patient gives verbal consent for treatment and assignment of benefits for services provided during this visit. Patient/Guardian expressed understanding and agreed to proceed.   Aavya Shafer [place], [place] 07/01/2023
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
## 2023-07-01 NOTE — Patient Instructions (Addendum)
Nice to see you.  We will check your lab work today.  Continue to take your medication daily as prescribed.  Refills have been sent to the pharmacy.  Please call [facility] ([facility] to schedule/follow up on your dental care at [phone]  Plan for follow up in 1 months or sooner if needed with lab work on the same day.  Have a great day and stay safe!  

## 2023-07-02 ENCOUNTER — Encounter: Payer: Self-pay | Admitting: Family

## 2023-07-02 LAB — T-HELPER CELL (CD4) - (RCID CLINIC ONLY)
CD4 % Helper T Cell: 21 % — ABNORMAL LOW (ref 33–65)
CD4 T Cell Abs: 741 /uL (ref 400–1790)

## 2023-07-02 NOTE — Assessment & Plan Note (Signed)
 Lori Ford has poorly controlled virus secondary to being off medication for at least the last 2 months.  Discussed importance of taking medications daily as prescribed and regular routine follow-up to ensure viral suppression and reduce risk of disease progression and complications in the future.  Check blood work including genotype.  She is interested in injectable medications as she has a hard time remembering her medications at times.  She does have previous possible resistance to rilpivirne on most recent genotype. I think she would benefit from injectable medications and may be more consistent with treatment and will pursue obtaining lenacapravir and Cabenvua. For now continue current dose of Biktarvy while awaiting resistance testing results. Social determinants of health reviewed and have connected her to counseling.  Plan for follow-up in 1 month or sooner if needed.

## 2023-07-02 NOTE — Assessment & Plan Note (Signed)
 Discussed importance of safe sexual practice and condom use. Condoms and site specific STD testing offered.  Vaccinations reviewed and declined after counseling. Due for routine dental care and will refer to [facility]  Mammogram and cervical cancer screening updated today. Due for colon cancer screening.

## 2023-07-02 NOTE — Assessment & Plan Note (Signed)
 Having exacerbation of depression with recent deaths in the family and no suicidal ideations or signs of psychosis. Previously in group counseling which was helping. Warm handoff provided to our counselor here to establish care. Hold off on medication at this point.

## 2023-07-03 ENCOUNTER — Other Ambulatory Visit (HOSPITAL_COMMUNITY): Payer: Self-pay

## 2023-07-10 LAB — RPR: RPR Ser Ql: NONREACTIVE

## 2023-07-10 LAB — COMPLETE METABOLIC PANEL WITHOUT GFR
AG Ratio: 1.4 (calc) (ref 1.0–2.5)
ALT: 11 U/L (ref 6–29)
AST: 12 U/L (ref 10–35)
Albumin: 4.2 g/dL (ref 3.6–5.1)
Alkaline phosphatase (APISO): 67 U/L (ref 31–125)
BUN: 12 mg/dL (ref 7–25)
CO2: 27 mmol/L (ref 20–32)
Calcium: 9.2 mg/dL (ref 8.6–10.2)
Chloride: 103 mmol/L (ref 98–110)
Creat: 0.9 mg/dL (ref 0.50–0.99)
Globulin: 3 g/dL (ref 1.9–3.7)
Glucose, Bld: 111 mg/dL — ABNORMAL HIGH (ref 65–99)
Potassium: 4.6 mmol/L (ref 3.5–5.3)
Sodium: 138 mmol/L (ref 135–146)
Total Bilirubin: 0.4 mg/dL (ref 0.2–1.2)
Total Protein: 7.2 g/dL (ref 6.1–8.1)

## 2023-07-10 LAB — LIPID PANEL
Cholesterol: 155 mg/dL (ref <200)
HDL: 48 mg/dL — ABNORMAL LOW (ref >=50)
LDL Cholesterol (Calc): 90 mg/dL
Non-HDL Cholesterol (Calc): 107 mg/dL (ref <130)
Total CHOL/HDL Ratio: 3.2 (calc) (ref <5.0)
Triglycerides: 82 mg/dL (ref <150)

## 2023-07-10 LAB — HIV-1 INTEGRASE GENOTYPE

## 2023-07-10 LAB — HIV RNA, RTPCR W/R GT (RTI, PI,INT)
HIV 1 RNA Quant: 23100 {copies}/mL — ABNORMAL HIGH
HIV-1 RNA Quant, Log: 4.36 {Log_copies}/mL — ABNORMAL HIGH

## 2023-07-10 LAB — HIV-1 GENOTYPE: HIV-1 Genotype: DETECTED — AB

## 2023-07-23 ENCOUNTER — Ambulatory Visit: Payer: MEDICAID | Attending: Physician Assistant | Admitting: Physical Therapy

## 2023-07-23 ENCOUNTER — Encounter: Payer: Self-pay | Admitting: Physical Therapy

## 2023-07-23 ENCOUNTER — Other Ambulatory Visit: Payer: Self-pay

## 2023-07-23 VITALS — BP 150/107 | HR 68

## 2023-07-23 DIAGNOSIS — M25512 Pain in left shoulder: Secondary | ICD-10-CM | POA: Diagnosis present

## 2023-07-23 DIAGNOSIS — M542 Cervicalgia: Secondary | ICD-10-CM | POA: Insufficient documentation

## 2023-07-23 DIAGNOSIS — M25511 Pain in right shoulder: Secondary | ICD-10-CM | POA: Diagnosis present

## 2023-07-23 DIAGNOSIS — R2681 Unsteadiness on feet: Secondary | ICD-10-CM | POA: Insufficient documentation
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
## 2023-09-11 ENCOUNTER — Other Ambulatory Visit: Payer: Self-pay

## 2023-09-11 MED ORDER — DAPAGLIFLOZIN PROPANEDIOL 5 MG PO TABS
5.0000 mg | ORAL_TABLET | Freq: Every day | ORAL | 0 refills | Status: DC
  Filled 2023-09-11: qty 90, 90d supply, fill #0

## 2023-09-12 ENCOUNTER — Other Ambulatory Visit: Payer: Self-pay

## 2023-09-12 NOTE — Progress Notes (Signed)
 The ASCVD Risk score (Arnett DK, et al., 2019) failed to calculate for the following reasons:   Risk score cannot be calculated because patient has a medical history suggesting prior/existing ASCVD  Arlon Bergamo, BSN, RN

## 2023-09-15 ENCOUNTER — Other Ambulatory Visit: Payer: Self-pay

## 2023-09-25 ENCOUNTER — Encounter (HOSPITAL_COMMUNITY): Payer: Self-pay

## 2023-09-25 ENCOUNTER — Ambulatory Visit (HOSPITAL_COMMUNITY)
Admission: EM | Admit: 2023-09-25 | Discharge: 2023-09-25 | Disposition: A | Discharge status: Home / Self Care | Payer: MEDICAID | Attending: Family Medicine | Admitting: Family Medicine

## 2023-09-25 ENCOUNTER — Telehealth: Payer: Self-pay

## 2023-09-25 DIAGNOSIS — B9689 Other specified bacterial agents as the cause of diseases classified elsewhere: Secondary | ICD-10-CM

## 2023-09-25 DIAGNOSIS — J019 Acute sinusitis, unspecified: Secondary | ICD-10-CM | POA: Diagnosis not present

## 2023-09-25 DIAGNOSIS — J069 Acute upper respiratory infection, unspecified: Secondary | ICD-10-CM | POA: Diagnosis not present

## 2023-09-25 MED ORDER — PROMETHAZINE-DM 6.25-15 MG/5ML PO SYRP: 5.0000 mL | ORAL_SOLUTION | Freq: Four times a day (QID) | ORAL | 0 refills | Status: AC | PRN

## 2023-09-25 MED ORDER — DOXYCYCLINE HYCLATE 100 MG PO CAPS: 100.0000 mg | ORAL_CAPSULE | Freq: Two times a day (BID) | ORAL | 0 refills | Status: AC

## 2023-09-25 MED ORDER — FLUTICASONE PROPIONATE 50 MCG/ACT NA SUSP
1.0000 | Freq: Every day | NASAL | 0 refills | Status: AC
Start: 2023-09-25 — End: ?
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
## 2023-09-25 NOTE — Discharge Instructions (Signed)
 Take your antibiotics to completion I recommend starting a probiotic to protect your microbiome as well Continue Flonase  and the Promethazine  DM for symptom control Ensure you are getting adequate hydration

## 2023-09-25 NOTE — ED Triage Notes (Signed)
 Chief Complaint: productive cough, congestion, thick yellow/green mucus, and migraine. Patient feeling dizzy, weak, and light headed.   Sick exposure: Yes- Patient's husband   Onset: 2 weeks   Prescriptions or OTC medications tried: Yes- Motrin, Ibuprofen, promethazine  DM, Tylenol , Halls cough drops, Mucinex , Tessalon  Perles, Hot tea, Dayquil, Nyquil    with no relief  New foods, medications, or products: No  Recent Travel: No

## 2023-09-25 NOTE — Telephone Encounter (Signed)
Called pt left message to call back to schedule an appt.

## 2023-09-26 ENCOUNTER — Other Ambulatory Visit: Payer: Self-pay

## 2023-09-29 ENCOUNTER — Other Ambulatory Visit: Payer: Self-pay

## 2023-09-29 NOTE — Progress Notes (Signed)
 Specialty Pharmacy Ongoing Clinical Assessment Note  Lori Ford is a 48 y.o. female who is being followed by the specialty pharmacy service for RxSp HIV   Patient's specialty medication(s) reviewed today: Bictegravir-Emtricitab-Tenofov (Biktarvy )   Missed doses in the last 4 weeks: 0   Patient/Caregiver did not have any additional questions or concerns.   Therapeutic benefit summary: Unable to assess (Patient needs follow up appointment)   Adverse events/side effects summary: No adverse events/side effects   Patient's therapy is appropriate to: Continue    Goals Addressed             This Visit's Progress    Achieve Undetectable HIV Viral Load < 20       Patient is not on track and due for follow up appointment. Patient will maintain adherence and adhere to provider and/or lab appointments. Last VL from 07/01/23 was 23,100.          Follow up: 3 months  Lev Cervone M Modene Andy Specialty Pharmacist

## 2023-10-09 ENCOUNTER — Other Ambulatory Visit: Payer: Self-pay

## 2023-10-09 ENCOUNTER — Ambulatory Visit (HOSPITAL_COMMUNITY): Payer: Self-pay

## 2023-10-10 ENCOUNTER — Other Ambulatory Visit: Payer: Self-pay

## 2023-10-10 MED ORDER — FARXIGA 5 MG PO TABS
5.0000 mg | ORAL_TABLET | Freq: Every day | ORAL | 0 refills | Status: DC
  Filled 2023-10-10: qty 90, 90d supply, fill #0

## 2023-10-20 ENCOUNTER — Ambulatory Visit (HOSPITAL_COMMUNITY): Admission: EM | Admit: 2023-10-20 | Discharge: 2023-10-20 | Discharge status: Against Medical Advice | Payer: MEDICAID

## 2023-10-20 NOTE — ED Notes (Signed)
 Called x 3 from lobby with no answer

## 2023-10-20 NOTE — ED Notes (Signed)
 No answer

## 2023-10-20 NOTE — ED Notes (Signed)
No answer x2 in waiting.  

## 2023-10-23 ENCOUNTER — Telehealth: Payer: Self-pay

## 2023-10-23 NOTE — Telephone Encounter (Signed)
 Left message asking pt to call to schedule an appointment to return to care. I asked pt to return call to [phone] to schedule that appointment.

## 2023-11-07 ENCOUNTER — Other Ambulatory Visit (HOSPITAL_COMMUNITY): Payer: Self-pay

## 2023-11-07 MED ORDER — OZEMPIC (1 MG/DOSE) 4 MG/3ML ~~LOC~~ SOPN
1.0000 mg | PEN_INJECTOR | SUBCUTANEOUS | 1 refills | Status: AC
  Filled 2023-11-07: qty 3, 28d supply, fill #0
  Filled 2023-12-03 – 2023-12-08 (×3): qty 3, 28d supply, fill #1

## 2023-11-10 ENCOUNTER — Other Ambulatory Visit (HOSPITAL_COMMUNITY): Payer: Self-pay

## 2023-12-02 ENCOUNTER — Other Ambulatory Visit: Payer: Self-pay | Admitting: Medical Genetics

## 2023-12-03 ENCOUNTER — Other Ambulatory Visit (HOSPITAL_COMMUNITY): Payer: Self-pay

## 2023-12-08 ENCOUNTER — Other Ambulatory Visit (HOSPITAL_COMMUNITY): Payer: Self-pay

## 2023-12-08 ENCOUNTER — Other Ambulatory Visit: Payer: Self-pay

## 2023-12-10 ENCOUNTER — Other Ambulatory Visit (HOSPITAL_COMMUNITY): Payer: Self-pay

## 2023-12-12 ENCOUNTER — Other Ambulatory Visit (HOSPITAL_COMMUNITY): Payer: Self-pay

## 2023-12-12 MED ORDER — AIMOVIG 140 MG/ML ~~LOC~~ SOAJ
140.0000 mg | SUBCUTANEOUS | 6 refills | Status: AC
  Filled 2023-12-12: qty 1, 30d supply, fill #0
  Filled 2024-04-21 – 2024-05-14 (×2): qty 1, 30d supply, fill #1

## 2023-12-26 ENCOUNTER — Other Ambulatory Visit: Payer: Self-pay | Admitting: Pharmacist

## 2023-12-26 NOTE — Progress Notes (Signed)
 Patient has not followed up with clinic since March - unable to reach via phone or MyChart. Has not filled medication since January and has 4 bottles waiting in [facility] office. Will inactivate due to non-compliance.  Alan Geralds, PharmD, CPP, BCIDP, AAHIVP Clinical Pharmacist Practitioner Infectious Diseases Clinical Pharmacist [facility]

## 2024-01-06 ENCOUNTER — Other Ambulatory Visit: Payer: Self-pay

## 2024-01-07 ENCOUNTER — Other Ambulatory Visit (HOSPITAL_COMMUNITY): Payer: Self-pay

## 2024-01-13 ENCOUNTER — Other Ambulatory Visit (HOSPITAL_COMMUNITY): Payer: Self-pay

## 2024-01-14 ENCOUNTER — Other Ambulatory Visit (HOSPITAL_COMMUNITY): Payer: Self-pay

## 2024-01-14 ENCOUNTER — Encounter (HOSPITAL_COMMUNITY): Payer: Self-pay

## 2024-01-15 ENCOUNTER — Other Ambulatory Visit (HOSPITAL_COMMUNITY): Payer: Self-pay

## 2024-02-05 ENCOUNTER — Other Ambulatory Visit: Payer: Self-pay | Admitting: Medical Genetics

## 2024-02-05 DIAGNOSIS — Z006 Encounter for examination for normal comparison and control in clinical research program: Secondary | ICD-10-CM

## 2024-02-06 ENCOUNTER — Other Ambulatory Visit: Payer: Self-pay

## 2024-02-06 ENCOUNTER — Other Ambulatory Visit (HOSPITAL_COMMUNITY): Payer: Self-pay

## 2024-02-06 MED ORDER — DAPAGLIFLOZIN PROPANEDIOL 5 MG PO TABS
5.0000 mg | ORAL_TABLET | Freq: Every day | ORAL | 0 refills | Status: DC
  Filled 2024-02-06: qty 90, 90d supply, fill #0

## 2024-03-03 ENCOUNTER — Other Ambulatory Visit (HOSPITAL_COMMUNITY): Payer: Self-pay

## 2024-03-03 MED ORDER — OXYCODONE HCL 10 MG PO TABS
10.0000 mg | ORAL_TABLET | Freq: Four times a day (QID) | ORAL | 0 refills | Status: DC | PRN
  Filled 2024-03-03 (×2): qty 120, 30d supply, fill #0

## 2024-03-05 ENCOUNTER — Other Ambulatory Visit (HOSPITAL_COMMUNITY): Payer: Self-pay

## 2024-03-31 ENCOUNTER — Other Ambulatory Visit: Payer: Self-pay

## 2024-03-31 ENCOUNTER — Other Ambulatory Visit (HOSPITAL_COMMUNITY): Payer: Self-pay

## 2024-03-31 MED ORDER — TRELEGY ELLIPTA 100-62.5-25 MCG/ACT IN AEPB
1.0000 | INHALATION_SPRAY | Freq: Every day | RESPIRATORY_TRACT | 0 refills | Status: AC
  Filled 2024-03-31: qty 60, 30d supply, fill #0

## 2024-03-31 MED ORDER — ACCU-CHEK GUIDE TEST VI STRP
ORAL_STRIP | Freq: Three times a day (TID) | 4 refills | Status: AC
  Filled 2024-03-31: qty 100, 50d supply, fill #0
  Filled 2024-03-31 (×3): qty 100, 33d supply, fill #0
  Filled 2024-04-14: qty 100, 33d supply, fill #1
  Filled 2024-05-14: qty 100, 33d supply, fill #2

## 2024-03-31 MED ORDER — TRELEGY ELLIPTA 100-62.5-25 MCG/ACT IN AEPB
1.0000 | INHALATION_SPRAY | Freq: Every day | RESPIRATORY_TRACT | 1 refills | Status: AC
  Filled 2024-03-31: qty 60, 30d supply, fill #0
  Filled 2024-04-14: qty 60, 30d supply, fill #1

## 2024-03-31 MED ORDER — OZEMPIC (1 MG/DOSE) 4 MG/3ML ~~LOC~~ SOPN
1.0000 mg | PEN_INJECTOR | SUBCUTANEOUS | 2 refills | Status: AC
  Filled 2024-03-31 (×2): qty 3, 28d supply, fill #0

## 2024-04-01 ENCOUNTER — Other Ambulatory Visit: Payer: Self-pay

## 2024-04-02 ENCOUNTER — Other Ambulatory Visit (HOSPITAL_COMMUNITY): Payer: Self-pay

## 2024-04-02 ENCOUNTER — Other Ambulatory Visit (HOSPITAL_BASED_OUTPATIENT_CLINIC_OR_DEPARTMENT_OTHER): Payer: Self-pay

## 2024-04-02 MED ORDER — OXYCODONE HCL 10 MG PO TABS
10.0000 mg | ORAL_TABLET | Freq: Four times a day (QID) | ORAL | 0 refills | Status: DC | PRN
  Filled 2024-04-02 (×3): qty 120, 30d supply, fill #0

## 2024-04-14 ENCOUNTER — Other Ambulatory Visit: Payer: Self-pay

## 2024-04-14 ENCOUNTER — Other Ambulatory Visit (HOSPITAL_COMMUNITY): Payer: Self-pay

## 2024-04-14 ENCOUNTER — Encounter (HOSPITAL_COMMUNITY): Payer: Self-pay

## 2024-04-16 ENCOUNTER — Other Ambulatory Visit (HOSPITAL_COMMUNITY): Payer: Self-pay

## 2024-04-16 MED ORDER — OZEMPIC (1 MG/DOSE) 4 MG/3ML ~~LOC~~ SOPN
1.0000 mg | PEN_INJECTOR | SUBCUTANEOUS | 2 refills | Status: AC
  Filled 2024-04-21: qty 3, 28d supply, fill #0
  Filled 2024-05-14: qty 3, 28d supply, fill #1

## 2024-04-16 MED ORDER — PROMETHAZINE HCL 6.25 MG/5ML PO SOLN
12.5000 mg | Freq: Three times a day (TID) | ORAL | 2 refills | Status: AC | PRN
  Filled 2024-04-16 – 2024-04-21 (×2): qty 900, 30d supply, fill #0
  Filled ????-??-??: fill #0

## 2024-04-16 MED ORDER — TRELEGY ELLIPTA 100-62.5-25 MCG/ACT IN AEPB
1.0000 | INHALATION_SPRAY | Freq: Every day | RESPIRATORY_TRACT | 1 refills | Status: AC
  Filled 2024-05-14: qty 60, 30d supply, fill #0

## 2024-04-19 ENCOUNTER — Other Ambulatory Visit (HOSPITAL_COMMUNITY): Payer: Self-pay

## 2024-04-20 ENCOUNTER — Other Ambulatory Visit (HOSPITAL_COMMUNITY): Payer: Self-pay

## 2024-04-21 ENCOUNTER — Other Ambulatory Visit: Payer: Self-pay

## 2024-04-21 ENCOUNTER — Other Ambulatory Visit (HOSPITAL_COMMUNITY): Payer: Self-pay

## 2024-04-21 MED ORDER — ATORVASTATIN CALCIUM 20 MG PO TABS
20.0000 mg | ORAL_TABLET | Freq: Every day | ORAL | 1 refills | Status: AC
  Filled 2024-05-18: qty 90, 90d supply, fill #0

## 2024-04-21 MED ORDER — BUDESON-GLYCOPYRROL-FORMOTEROL 160-9-4.8 MCG/ACT IN AERO: 2.0000 | INHALATION_SPRAY | Freq: Two times a day (BID) | RESPIRATORY_TRACT | 6 refills | Status: AC

## 2024-04-21 MED ORDER — PROMETHAZINE HCL 6.25 MG/5ML PO SOLN
12.5000 mg | Freq: Three times a day (TID) | ORAL | 2 refills | Status: AC
  Filled 2024-04-22: qty 900, 30d supply, fill #0
  Filled 2024-04-23: qty 946, 32d supply, fill #0

## 2024-04-21 MED ORDER — IPRATROPIUM-ALBUTEROL 0.5-2.5 (3) MG/3ML IN SOLN: 3.0000 mL | Freq: Four times a day (QID) | RESPIRATORY_TRACT | 3 refills | Status: AC | PRN

## 2024-04-21 MED ORDER — OMEPRAZOLE 40 MG PO CPDR: 40.0000 mg | DELAYED_RELEASE_CAPSULE | Freq: Every day | ORAL | 1 refills | Status: AC

## 2024-04-21 MED ORDER — AIMOVIG 140 MG/ML ~~LOC~~ SOAJ: 140.0000 mg | SUBCUTANEOUS | 6 refills | Status: AC

## 2024-04-21 MED ORDER — DAPAGLIFLOZIN PROPANEDIOL 5 MG PO TABS
5.0000 mg | ORAL_TABLET | Freq: Every day | ORAL | 0 refills | Status: AC
  Filled 2024-04-21: qty 90, 90d supply, fill #0

## 2024-04-21 MED ORDER — LOSARTAN POTASSIUM 100 MG PO TABS: 100.0000 mg | ORAL_TABLET | Freq: Every day | ORAL | 1 refills | Status: AC

## 2024-04-22 ENCOUNTER — Other Ambulatory Visit (HOSPITAL_COMMUNITY): Payer: Self-pay

## 2024-04-23 ENCOUNTER — Other Ambulatory Visit: Payer: Self-pay

## 2024-04-23 ENCOUNTER — Other Ambulatory Visit (HOSPITAL_COMMUNITY): Payer: Self-pay

## 2024-04-23 DIAGNOSIS — Z113 Encounter for screening for infections with a predominantly sexual mode of transmission: Secondary | ICD-10-CM

## 2024-04-23 DIAGNOSIS — B20 Human immunodeficiency virus [HIV] disease: Secondary | ICD-10-CM

## 2024-04-26 ENCOUNTER — Other Ambulatory Visit: Payer: MEDICAID

## 2024-04-26 ENCOUNTER — Other Ambulatory Visit: Payer: Self-pay

## 2024-04-26 ENCOUNTER — Other Ambulatory Visit (HOSPITAL_COMMUNITY)
Admission: RE | Admit: 2024-04-26 | Discharge: 2024-04-26 | Disposition: A | Discharge status: Home / Self Care | Payer: MEDICAID | Source: Ambulatory Visit | Attending: Infectious Disease | Admitting: Infectious Disease

## 2024-04-26 DIAGNOSIS — Z113 Encounter for screening for infections with a predominantly sexual mode of transmission: Secondary | ICD-10-CM | POA: Insufficient documentation

## 2024-04-26 DIAGNOSIS — B20 Human immunodeficiency virus [HIV] disease: Secondary | ICD-10-CM

## 2024-04-27 LAB — URINE CYTOLOGY ANCILLARY ONLY
Chlamydia: NEGATIVE
Comment: NEGATIVE
Comment: NORMAL
Neisseria Gonorrhea: NEGATIVE

## 2024-04-27 LAB — T-HELPER CELL (CD4) - (RCID CLINIC ONLY)
CD4 % Helper T Cell: 16 % — ABNORMAL LOW (ref 33–65)
CD4 T Cell Abs: 418 /uL (ref 400–1790)

## 2024-04-28 LAB — CBC WITH DIFFERENTIAL/PLATELET
Absolute Lymphocytes: 2964 {cells}/uL (ref 850–3900)
Absolute Monocytes: 380 {cells}/uL (ref 200–950)
Basophils Absolute: 21 {cells}/uL (ref 0–200)
Basophils Relative: 0.4 %
Eosinophils Absolute: 10 {cells}/uL — ABNORMAL LOW (ref 15–500)
Eosinophils Relative: 0.2 %
HCT: 39.9 % (ref 35.9–46.0)
Hemoglobin: 12.5 g/dL (ref 11.7–15.5)
MCH: 26 pg — ABNORMAL LOW (ref 27.0–33.0)
MCHC: 31.3 g/dL — ABNORMAL LOW (ref 31.6–35.4)
MCV: 83.1 fL (ref 81.4–101.7)
MPV: 12.2 fL (ref 7.5–12.5)
Monocytes Relative: 7.3 %
Neutro Abs: 1825 {cells}/uL (ref 1500–7800)
Neutrophils Relative %: 35.1 %
Platelets: 288 Thousand/uL (ref 140–400)
RBC: 4.8 Million/uL (ref 3.80–5.10)
RDW: 15.3 % — ABNORMAL HIGH (ref 11.0–15.0)
Total Lymphocyte: 57 %
WBC: 5.2 Thousand/uL (ref 3.8–10.8)

## 2024-04-28 LAB — COMPLETE METABOLIC PANEL WITHOUT GFR
AG Ratio: 1.1 (calc) (ref 1.0–2.5)
ALT: 12 U/L (ref 6–29)
AST: 15 U/L (ref 10–35)
Albumin: 4.4 g/dL (ref 3.6–5.1)
Alkaline phosphatase (APISO): 67 U/L (ref 31–125)
BUN: 8 mg/dL (ref 7–25)
CO2: 28 mmol/L (ref 20–32)
Calcium: 9.3 mg/dL (ref 8.6–10.2)
Chloride: 103 mmol/L (ref 98–110)
Creat: 0.84 mg/dL (ref 0.50–0.99)
Globulin: 4 g/dL — ABNORMAL HIGH (ref 1.9–3.7)
Glucose, Bld: 95 mg/dL (ref 65–99)
Potassium: 4.2 mmol/L (ref 3.5–5.3)
Sodium: 138 mmol/L (ref 135–146)
Total Bilirubin: 0.5 mg/dL (ref 0.2–1.2)
Total Protein: 8.4 g/dL — ABNORMAL HIGH (ref 6.1–8.1)

## 2024-04-28 LAB — HIV-1 RNA QUANT-NO REFLEX-BLD
HIV 1 RNA Quant: 64500 {copies}/mL — ABNORMAL HIGH
HIV-1 RNA Quant, Log: 4.81 {Log_copies}/mL — ABNORMAL HIGH

## 2024-04-28 LAB — SYPHILIS: RPR W/REFLEX TO RPR TITER AND TREPONEMAL ANTIBODIES, TRADITIONAL SCREENING AND DIAGNOSIS ALGORITHM: RPR Ser Ql: NONREACTIVE

## 2024-05-04 ENCOUNTER — Emergency Department (HOSPITAL_BASED_OUTPATIENT_CLINIC_OR_DEPARTMENT_OTHER): Payer: MEDICAID

## 2024-05-04 ENCOUNTER — Other Ambulatory Visit: Payer: Self-pay

## 2024-05-04 ENCOUNTER — Emergency Department (HOSPITAL_BASED_OUTPATIENT_CLINIC_OR_DEPARTMENT_OTHER)
Admission: EM | Admit: 2024-05-04 | Discharge: 2024-05-04 | Disposition: A | Discharge status: Home / Self Care | Payer: MEDICAID | Attending: Emergency Medicine | Admitting: Emergency Medicine

## 2024-05-04 ENCOUNTER — Encounter (HOSPITAL_BASED_OUTPATIENT_CLINIC_OR_DEPARTMENT_OTHER): Payer: Self-pay | Admitting: Emergency Medicine

## 2024-05-04 DIAGNOSIS — F17210 Nicotine dependence, cigarettes, uncomplicated: Secondary | ICD-10-CM | POA: Insufficient documentation

## 2024-05-04 DIAGNOSIS — J4489 Other specified chronic obstructive pulmonary disease: Secondary | ICD-10-CM | POA: Diagnosis not present

## 2024-05-04 DIAGNOSIS — Z85841 Personal history of malignant neoplasm of brain: Secondary | ICD-10-CM | POA: Diagnosis not present

## 2024-05-04 DIAGNOSIS — Z59819 Housing instability, housed unspecified: Secondary | ICD-10-CM | POA: Insufficient documentation

## 2024-05-04 DIAGNOSIS — J020 Streptococcal pharyngitis: Secondary | ICD-10-CM | POA: Diagnosis not present

## 2024-05-04 DIAGNOSIS — Z79899 Other long term (current) drug therapy: Secondary | ICD-10-CM | POA: Insufficient documentation

## 2024-05-04 DIAGNOSIS — Z91040 Latex allergy status: Secondary | ICD-10-CM | POA: Diagnosis not present

## 2024-05-04 DIAGNOSIS — Z21 Asymptomatic human immunodeficiency virus [HIV] infection status: Secondary | ICD-10-CM | POA: Diagnosis not present

## 2024-05-04 DIAGNOSIS — Z7984 Long term (current) use of oral hypoglycemic drugs: Secondary | ICD-10-CM | POA: Diagnosis not present

## 2024-05-04 DIAGNOSIS — I10 Essential (primary) hypertension: Secondary | ICD-10-CM | POA: Insufficient documentation

## 2024-05-04 DIAGNOSIS — R0789 Other chest pain: Secondary | ICD-10-CM

## 2024-05-04 DIAGNOSIS — R072 Precordial pain: Secondary | ICD-10-CM | POA: Diagnosis present

## 2024-05-04 DIAGNOSIS — H6692 Otitis media, unspecified, left ear: Secondary | ICD-10-CM | POA: Diagnosis not present

## 2024-05-04 DIAGNOSIS — E119 Type 2 diabetes mellitus without complications: Secondary | ICD-10-CM | POA: Insufficient documentation

## 2024-05-04 DIAGNOSIS — H6690 Otitis media, unspecified, unspecified ear: Secondary | ICD-10-CM

## 2024-05-04 LAB — COMPREHENSIVE METABOLIC PANEL WITH GFR
ALT: 11 U/L (ref 0–44)
AST: 13 U/L — ABNORMAL LOW (ref 15–41)
Albumin: 3.9 g/dL (ref 3.5–5.0)
Alkaline Phosphatase: 68 U/L (ref 38–126)
Anion gap: 10 (ref 5–15)
BUN: 11 mg/dL (ref 6–20)
CO2: 27 mmol/L (ref 22–32)
Calcium: 9.3 mg/dL (ref 8.9–10.3)
Chloride: 101 mmol/L (ref 98–111)
Creatinine, Ser: 0.79 mg/dL (ref 0.44–1.00)
GFR, Estimated: >60 mL/min
Glucose, Bld: 103 mg/dL — ABNORMAL HIGH (ref 70–99)
Potassium: 3.9 mmol/L (ref 3.5–5.1)
Sodium: 138 mmol/L (ref 135–145)
Total Bilirubin: 0.4 mg/dL (ref 0.0–1.2)
Total Protein: 7.3 g/dL (ref 6.5–8.1)

## 2024-05-04 LAB — CBC WITH DIFFERENTIAL/PLATELET
Abs Immature Granulocytes: 0.09 K/uL — ABNORMAL HIGH (ref 0.00–0.07)
Basophils Absolute: 0 K/uL (ref 0.0–0.1)
Basophils Relative: 1 %
Eosinophils Absolute: 0 K/uL (ref 0.0–0.5)
Eosinophils Relative: 0 %
HCT: 34.3 % — ABNORMAL LOW (ref 36.0–46.0)
Hemoglobin: 11 g/dL — ABNORMAL LOW (ref 12.0–15.0)
Immature Granulocytes: 1 %
Lymphocytes Relative: 45 %
Lymphs Abs: 3.4 K/uL (ref 0.7–4.0)
MCH: 26.4 pg (ref 26.0–34.0)
MCHC: 32.1 g/dL (ref 30.0–36.0)
MCV: 82.3 fL (ref 80.0–100.0)
Monocytes Absolute: 0.6 K/uL (ref 0.1–1.0)
Monocytes Relative: 8 %
Neutro Abs: 3.5 K/uL (ref 1.7–7.7)
Neutrophils Relative %: 45 %
Platelets: 293 K/uL (ref 150–400)
RBC: 4.17 MIL/uL (ref 3.87–5.11)
RDW: 16 % — ABNORMAL HIGH (ref 11.5–15.5)
WBC: 7.7 K/uL (ref 4.0–10.5)
nRBC: 0 % (ref 0.0–0.2)

## 2024-05-04 LAB — LIPASE, BLOOD: Lipase: 16 U/L (ref 11–51)

## 2024-05-04 LAB — RESP PANEL BY RT-PCR (RSV, FLU A&B, COVID)  RVPGX2
Influenza A by PCR: NEGATIVE
Influenza B by PCR: NEGATIVE
Resp Syncytial Virus by PCR: NEGATIVE
SARS Coronavirus 2 by RT PCR: NEGATIVE

## 2024-05-04 LAB — TROPONIN T, HIGH SENSITIVITY: Troponin T High Sensitivity: 7 ng/L (ref 0–19)

## 2024-05-04 LAB — GROUP A STREP BY PCR: Group A Strep by PCR: DETECTED — AB

## 2024-05-04 LAB — HCG, SERUM, QUALITATIVE: Preg, Serum: NEGATIVE

## 2024-05-04 MED ORDER — ALUM & MAG HYDROXIDE-SIMETH 200-200-20 MG/5ML PO SUSP
30.0000 mL | Freq: Once | ORAL | Status: AC
  Administered 2024-05-04: 30 mL via ORAL
  Filled 2024-05-04: qty 30

## 2024-05-04 MED ORDER — AMOXICILLIN-POT CLAVULANATE 875-125 MG PO TABS
1.0000 | ORAL_TABLET | Freq: Two times a day (BID) | ORAL | 0 refills | Status: AC
  Filled 2024-05-04: qty 14, 7d supply, fill #0

## 2024-05-04 MED ORDER — ACETAMINOPHEN 325 MG PO TABS
650.0000 mg | ORAL_TABLET | Freq: Four times a day (QID) | ORAL | 0 refills | Status: AC | PRN
  Filled 2024-05-04: qty 36, 5d supply, fill #0

## 2024-05-04 MED ORDER — DEXAMETHASONE SOD PHOSPHATE PF 10 MG/ML IJ SOLN
20.0000 mg | Freq: Once | INTRAMUSCULAR | Status: AC
  Administered 2024-05-04: 20 mg
  Filled 2024-05-04: qty 2

## 2024-05-04 MED ORDER — LIDOCAINE VISCOUS HCL 2 % MT SOLN
15.0000 mL | OROMUCOSAL | 0 refills | Status: AC | PRN
  Filled 2024-05-04: qty 100, 5d supply, fill #0

## 2024-05-04 MED ORDER — LIDOCAINE VISCOUS HCL 2 % MT SOLN
15.0000 mL | Freq: Once | OROMUCOSAL | Status: AC
  Administered 2024-05-04: 15 mL via ORAL
  Filled 2024-05-04: qty 15

## 2024-05-04 MED ORDER — AMOXICILLIN-POT CLAVULANATE 875-125 MG PO TABS
1.0000 | ORAL_TABLET | Freq: Once | ORAL | Status: AC
  Administered 2024-05-04: 1 via ORAL
  Filled 2024-05-04: qty 1

## 2024-05-04 MED ORDER — IBUPROFEN 600 MG PO TABS
600.0000 mg | ORAL_TABLET | Freq: Four times a day (QID) | ORAL | 0 refills | Status: AC | PRN
  Filled 2024-05-04: qty 30, 8d supply, fill #0

## 2024-05-04 MED ORDER — KETOROLAC TROMETHAMINE 15 MG/ML IJ SOLN
15.0000 mg | Freq: Once | INTRAMUSCULAR | Status: AC
  Administered 2024-05-04: 15 mg via INTRAVENOUS
  Filled 2024-05-04: qty 1

## 2024-05-04 MED ORDER — SODIUM CHLORIDE 0.9 % IV BOLUS
1000.0000 mL | Freq: Once | INTRAVENOUS | Status: AC
  Administered 2024-05-04: 1000 mL via INTRAVENOUS

## 2024-05-04 MED ORDER — IOHEXOL 300 MG/ML  SOLN
100.0000 mL | Freq: Once | INTRAMUSCULAR | Status: AC | PRN
  Administered 2024-05-04: 100 mL via INTRAVENOUS

## 2024-05-04 NOTE — ED Notes (Signed)
 IV stopped infusing, patient stating it is painful when flushing saline. Will attempt to place new IV

## 2024-05-04 NOTE — Discharge Instructions (Addendum)
 It was a pleasure caring for you today in the emergency department.  You have a throat infection and also ear infection.  I prescribed antibiotics that should treat these infections.  Recommend you drink a warm beverage with honey to help with your throat discomfort.  Be sure to drink plenty of liquids and get plenty of rest over the next few days.  Recommend you follow a soft diet.  Also sent prescription for a numbing throat medication.  Please follow-up with your PCP in the office for follow-up.  Please return to the emergency department for any worsening or worrisome symptoms including but not limited to difficulty breathing, inability to swallow your own secretions, difficulty opening your mouth, etc.

## 2024-05-04 NOTE — ED Notes (Signed)
 Noted pain and edema on the L side of Pt. Neck and painful to swallow and talk per pt. This all started a week ago.

## 2024-05-04 NOTE — ED Provider Notes (Addendum)
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  GROUP A STREP BY PCR - Abnormal; Notable for the following components:      Result Value   Group A Strep by PCR DETECTED (*)    All other components within normal limits  CBC WITH DIFFERENTIAL/PLATELET - Abnormal; Notable for the following components:   Hemoglobin 11.0 (*)    HCT 34.3 (*)    RDW 16.0 (*)    Abs Immature Granulocytes 0.09 (*)    All other components within normal limits  COMPREHENSIVE METABOLIC PANEL WITH GFR - Abnormal; Notable for the following components:   Glucose, Bld 103 (*)    AST 13 (*)    All other components within normal limits  RESP PANEL BY RT-PCR (RSV, FLU A&B, COVID)  [id number]  HCG, SERUM, QUALITATIVE  LIPASE, BLOOD  TROPONIN T, HIGH SENSITIVITY  TROPONIN T, HIGH SENSITIVITY    Notable for strep  EKG   EKG Interpretation Date/Time:    Ventricular Rate:    PR Interval:    QRS Duration:    QT Interval:    QTC Calculation:   R  Axis:      Text Interpretation:           Imaging Studies ordered: I ordered imaging studies including CT temporal/neck, cxr I independently visualized the following imaging with scope of interpretation limited to determining acute life threatening conditions related to emergency care; findings noted above I agree with the radiologist interpretation If any imaging was obtained with contrast I closely monitored patient  for any possible adverse reaction a/w contrast administration in the emergency department   Medicines ordered and prescription drug management: Meds ordered this encounter  Medications   ketorolac  (TORADOL ) 15 MG/ML injection 15 mg   sodium chloride  0.9 % bolus 1,000 mL   AND Linked Order Group    alum & mag hydroxide-simeth (MAALOX/MYLANTA) 200-200-20 MG/5ML suspension 30 mL    lidocaine  (XYLOCAINE ) 2 % viscous mouth solution 15 mL   iohexol  (OMNIPAQUE ) 300 MG/ML solution 100 mL   dexamethasone  (DECADRON ) injection 20 mg   amoxicillin -clavulanate (AUGMENTIN ) 875-125 MG per tablet 1 tablet   amoxicillin -clavulanate (AUGMENTIN ) 875-125 MG tablet    Sig: Take 1 tablet by mouth every 12 (twelve) hours.    Dispense:  14 tablet    Refill:  0   lidocaine  (XYLOCAINE ) 2 % solution    Sig: Use as directed 15 mLs in the mouth or throat as needed for mouth pain.    Dispense:  100 mL    Refill:  0   acetaminophen  (TYLENOL ) 325 MG tablet    Sig: Take 2 tablets (650 mg total) by mouth every 6 (six) hours as needed.    Dispense:  36 tablet    Refill:  0   ibuprofen  (ADVIL ) 600 MG tablet    Sig: Take 1 tablet (600 mg total) by mouth every 6 (six) hours as needed.    Dispense:  30 tablet    Refill:  0    -I have reviewed the patients home medicines and have made adjustments as needed   Consultations Obtained: na   Cardiac Monitoring: The patient was maintained on a cardiac monitor.  I personally viewed and interpreted the cardiac monitored which showed an underlying  rhythm of: NSR Continuous pulse oximetry interpreted by myself, 98% on RA.    Social Determinants of Health:  Diagnosis or treatment significantly limited by social determinants of health: current smoker and obesity   Reevaluation: After the interventions noted above, I reevaluated the patient and found that they have improved  Co morbidities that complicate the patient evaluation  Past Medical History:  Diagnosis Date   Allergic rhinitis    Anemia    Anxiety    Arthritis    Asthma    as child   Brain tumor (benign) (HCC)    Chronic back pain    Cigarette nicotine  dependence with withdrawal    COPD (chronic obstructive pulmonary disease) (HCC)    Depression    Diabetes mellitus without complication (HCC)    DVT (deep venous thrombosis) (HCC)    Endometriosis    Fibromyalgia    Gallstones    s/p cholecystectomy   GERD (gastroesophageal reflux disease)    Heart murmur    no issues   HIV (human immunodeficiency virus infection) (HCC)    HTN (hypertension)    Meningioma (HCC)    Migraine    Pneumonia    Sciatic pain    Seizure disorder, grand mal (HCC)    dx 2005   Seizures (HCC)    due to head trauma as adult   Sleep apnea    wears cpap   Stroke (HCC)    2014 no residual      Dispostion: Disposition decision including need for hospitalization was considered, and patient discharged from emergency department.    Final Clinical Impression(s) / ED Diagnoses Final diagnoses:  Acute otitis media, unspecified otitis media type  Strep pharyngitis  Atypical chest pain        Elnor,  Jayson LABOR, DO 05/04/24 2057     [1]  Social History Tobacco Use   Smoking status: Some Days    Current packs/day: 0.00    Average packs/day: 0.3 packs/day for 22.0 years (5.5 ttl pk-yrs)    Types: Cigarettes    Start date: 2002    Last attempt to quit: 2024    Years since quitting: 2.0    Passive exposure: Current   Smokeless tobacco: Never   Tobacco comments:    Pt  states she smokes 1/2 cig a day. 03/13/2022  Vaping Use   Vaping status: Never Used  Substance Use Topics   Alcohol use: Not Currently   Drug use: Not Currently     Elnor Jayson LABOR, DO 05/04/24 2057  "

## 2024-05-04 NOTE — ED Triage Notes (Signed)
 Chest pain X  1 week. Pain radiates up to throat and ears. Unable to swallow and ear canal swollen.

## 2024-05-05 ENCOUNTER — Other Ambulatory Visit (HOSPITAL_COMMUNITY): Payer: Self-pay

## 2024-05-06 ENCOUNTER — Other Ambulatory Visit (HOSPITAL_COMMUNITY): Payer: Self-pay

## 2024-05-07 ENCOUNTER — Other Ambulatory Visit (HOSPITAL_COMMUNITY): Payer: Self-pay

## 2024-05-10 ENCOUNTER — Ambulatory Visit: Payer: MEDICAID | Admitting: Family

## 2024-05-14 ENCOUNTER — Other Ambulatory Visit (HOSPITAL_COMMUNITY): Payer: Self-pay

## 2024-05-18 ENCOUNTER — Other Ambulatory Visit: Payer: Self-pay

## 2024-05-18 ENCOUNTER — Other Ambulatory Visit (HOSPITAL_COMMUNITY): Payer: Self-pay

## 2024-05-18 MED ORDER — HYDROCHLOROTHIAZIDE 25 MG PO TABS
25.0000 mg | ORAL_TABLET | Freq: Every morning | ORAL | 1 refills | Status: AC
  Filled 2024-05-18 (×2): qty 90, 90d supply, fill #0

## 2024-05-18 MED ORDER — ATORVASTATIN CALCIUM 20 MG PO TABS
20.0000 mg | ORAL_TABLET | Freq: Every day | ORAL | 1 refills | Status: AC
  Filled 2024-05-18: qty 90, 90d supply, fill #0

## 2024-05-18 MED ORDER — OXYCODONE HCL 10 MG PO TABS
10.0000 mg | ORAL_TABLET | Freq: Four times a day (QID) | ORAL | 0 refills | Status: AC | PRN
  Filled 2024-05-18: qty 120, 30d supply, fill #0

## 2024-05-18 MED ORDER — PROMETHAZINE HCL 6.25 MG/5ML PO SOLN
12.5000 mg | Freq: Three times a day (TID) | ORAL | 2 refills | Status: AC | PRN
  Filled 2024-05-18: qty 900, 30d supply, fill #0

## 2024-05-18 MED ORDER — FLUCONAZOLE 150 MG PO TABS
150.0000 mg | ORAL_TABLET | ORAL | 2 refills | Status: AC
  Filled 2024-05-18 (×2): qty 2, 3d supply, fill #0

## 2024-05-18 MED ORDER — AIMOVIG 140 MG/ML ~~LOC~~ SOAJ
140.0000 mg | SUBCUTANEOUS | 6 refills | Status: AC
  Filled 2024-05-18 – 2024-05-20 (×2): qty 1, 30d supply, fill #0

## 2024-05-19 ENCOUNTER — Telehealth: Payer: Self-pay

## 2024-05-19 NOTE — Telephone Encounter (Signed)
 Detectable Viral Load Intervention (DVL)  Most recent VL:  HIV 1 RNA Quant  Date Value Ref Range Status  04/26/2024 64,500 (H) NOT DETECTED copies/mL Final  07/01/2023 23,100 (H) copies/mL Final  11/08/2022 134 (H) Copies/mL Final    Last Clinic Visit: 07/01/23  Current ART regimen: Biktarvy   Appointment status: patient has future appointment scheduled  Medication last dispensed (per chart review):   Medication Adherence   Has not had medication in a while. Was not sure where to get medication filled. Would like to have Biktarvy  mailed to [facility] for pick up.    Barriers to Care   Did not assess   Interventions   Called patient to discuss medication adherence and possible barriers to care. Patient states she has been office medication for awhile now. Would like to know if she can have her meds mailed to office to pickup.  Will forward message to Cathlyn to advise if okay to resume. Has appt on 06/07/24. Lorenda CHRISTELLA Code, RMA

## 2024-05-20 ENCOUNTER — Other Ambulatory Visit (HOSPITAL_COMMUNITY): Payer: Self-pay

## 2024-06-07 ENCOUNTER — Ambulatory Visit: Payer: MEDICAID | Admitting: Family
